# Patient Record
Sex: Male | Born: 1955 | Race: White | Hispanic: No | Marital: Married | Smoking: Former smoker
Health system: Southern US, Community
[De-identification: ages and names within clinical notes are randomized; demographics above are authoritative.]

## PROBLEM LIST (undated history)

## (undated) DIAGNOSIS — R350 Frequency of micturition: Secondary | ICD-10-CM

## (undated) DIAGNOSIS — R600 Localized edema: Secondary | ICD-10-CM

## (undated) DIAGNOSIS — I10 Essential (primary) hypertension: Secondary | ICD-10-CM

## (undated) DIAGNOSIS — E785 Hyperlipidemia, unspecified: Secondary | ICD-10-CM

## (undated) DIAGNOSIS — R531 Weakness: Secondary | ICD-10-CM

## (undated) DIAGNOSIS — F32A Depression, unspecified: Secondary | ICD-10-CM

## (undated) DIAGNOSIS — F329 Major depressive disorder, single episode, unspecified: Secondary | ICD-10-CM

## (undated) DIAGNOSIS — M1990 Unspecified osteoarthritis, unspecified site: Secondary | ICD-10-CM

## (undated) DIAGNOSIS — M2540 Effusion, unspecified joint: Secondary | ICD-10-CM

## (undated) DIAGNOSIS — F419 Anxiety disorder, unspecified: Secondary | ICD-10-CM

## (undated) DIAGNOSIS — M2550 Pain in unspecified joint: Secondary | ICD-10-CM

## (undated) DIAGNOSIS — G8929 Other chronic pain: Secondary | ICD-10-CM

## (undated) DIAGNOSIS — R609 Edema, unspecified: Secondary | ICD-10-CM

## (undated) DIAGNOSIS — M549 Dorsalgia, unspecified: Secondary | ICD-10-CM

## (undated) HISTORY — PX: COLONOSCOPY: SHX174

## (undated) HISTORY — DX: Essential (primary) hypertension: I10

---

## 1987-12-07 HISTORY — PX: TIBIA HARDWARE REMOVAL: SUR1133

## 1987-12-07 HISTORY — PX: TIBIA FRACTURE SURGERY: SHX806

## 2012-05-03 ENCOUNTER — Emergency Department: Payer: Self-pay | Admitting: Emergency Medicine

## 2012-05-03 LAB — CBC
HCT: 43.5 % (ref 40.0–52.0)
MCV: 94 fL (ref 80–100)
Platelet: 251 10*3/uL (ref 150–440)
RBC: 4.61 10*6/uL (ref 4.40–5.90)
RDW: 13.6 % (ref 11.5–14.5)
WBC: 6.8 10*3/uL (ref 3.8–10.6)

## 2012-05-03 LAB — BASIC METABOLIC PANEL
Anion Gap: 10 (ref 7–16)
BUN: 11 mg/dL (ref 7–18)
Calcium, Total: 9.1 mg/dL (ref 8.5–10.1)
Co2: 25 mmol/L (ref 21–32)
Creatinine: 0.79 mg/dL (ref 0.60–1.30)
EGFR (African American): >60
Glucose: 81 mg/dL (ref 65–99)
Osmolality: 285 (ref 275–301)
Sodium: 144 mmol/L (ref 136–145)

## 2012-05-03 LAB — URINALYSIS, COMPLETE
Bacteria: NONE SEEN
Bilirubin,UR: NEGATIVE
Blood: NEGATIVE
Glucose,UR: NEGATIVE mg/dL (ref 0–75)
Ketone: NEGATIVE
Leukocyte Esterase: NEGATIVE
Nitrite: NEGATIVE
Protein: NEGATIVE
RBC,UR: NONE SEEN /HPF (ref 0–5)
Specific Gravity: 1.008 (ref 1.003–1.030)
Squamous Epithelial: <1
WBC UR: <1 /HPF (ref 0–5)

## 2012-05-03 LAB — CK TOTAL AND CKMB (NOT AT ARMC)
CK, Total: 76 U/L (ref 35–232)
CK-MB: 2.8 ng/mL (ref 0.5–3.6)

## 2012-05-03 LAB — TROPONIN I: Troponin-I: 0.03 ng/mL

## 2012-05-12 ENCOUNTER — Encounter: Payer: Self-pay | Admitting: Internal Medicine

## 2012-05-12 ENCOUNTER — Telehealth: Payer: Self-pay | Admitting: Internal Medicine

## 2012-05-12 ENCOUNTER — Ambulatory Visit (INDEPENDENT_AMBULATORY_CARE_PROVIDER_SITE_OTHER): Payer: PRIVATE HEALTH INSURANCE | Admitting: Internal Medicine

## 2012-05-12 VITALS — BP 210/124 | HR 69 | Temp 98.2°F | Resp 18 | Ht 71.0 in | Wt 270.5 lb

## 2012-05-12 DIAGNOSIS — R499 Unspecified voice and resonance disorder: Secondary | ICD-10-CM

## 2012-05-12 DIAGNOSIS — Z87891 Personal history of nicotine dependence: Secondary | ICD-10-CM | POA: Insufficient documentation

## 2012-05-12 DIAGNOSIS — M79606 Pain in leg, unspecified: Secondary | ICD-10-CM

## 2012-05-12 DIAGNOSIS — I10 Essential (primary) hypertension: Secondary | ICD-10-CM

## 2012-05-12 DIAGNOSIS — F17200 Nicotine dependence, unspecified, uncomplicated: Secondary | ICD-10-CM

## 2012-05-12 DIAGNOSIS — M79609 Pain in unspecified limb: Secondary | ICD-10-CM

## 2012-05-12 DIAGNOSIS — G8929 Other chronic pain: Secondary | ICD-10-CM

## 2012-05-12 DIAGNOSIS — K0889 Other specified disorders of teeth and supporting structures: Secondary | ICD-10-CM

## 2012-05-12 DIAGNOSIS — Z720 Tobacco use: Secondary | ICD-10-CM

## 2012-05-12 DIAGNOSIS — R498 Other voice and resonance disorders: Secondary | ICD-10-CM

## 2012-05-12 DIAGNOSIS — K089 Disorder of teeth and supporting structures, unspecified: Secondary | ICD-10-CM

## 2012-05-12 HISTORY — DX: Unspecified voice and resonance disorder: R49.9

## 2012-05-12 MED ORDER — OMEPRAZOLE 40 MG PO CPDR: 40.0000 mg | DELAYED_RELEASE_CAPSULE | Freq: Every day | ORAL | Status: DC

## 2012-05-12 MED ORDER — ESOMEPRAZOLE MAGNESIUM 40 MG PO CPDR: 40.0000 mg | DELAYED_RELEASE_CAPSULE | Freq: Every day | ORAL | Status: DC

## 2012-05-12 MED ORDER — TELMISARTAN-HCTZ 80-25 MG PO TABS: 1.0000 | ORAL_TABLET | Freq: Every day | ORAL | Status: DC

## 2012-05-12 MED ORDER — SERTRALINE HCL 50 MG PO TABS: 50.0000 mg | ORAL_TABLET | Freq: Every day | ORAL | Status: DC

## 2012-05-12 MED ORDER — TRAMADOL HCL 50 MG PO TABS: 50.0000 mg | ORAL_TABLET | Freq: Three times a day (TID) | ORAL | Status: DC | PRN

## 2012-05-12 MED ORDER — HYDROCODONE-ACETAMINOPHEN 5-500 MG PO TABS: 1.0000 | ORAL_TABLET | Freq: Four times a day (QID) | ORAL | Status: DC | PRN

## 2012-05-12 MED ORDER — DIAZEPAM 5 MG PO TABS: 5.0000 mg | ORAL_TABLET | Freq: Two times a day (BID) | ORAL | Status: DC | PRN

## 2012-05-12 NOTE — Telephone Encounter (Signed)
Rx called in 

## 2012-05-12 NOTE — Patient Instructions (Addendum)
Continue hydralazine 3 times  daily for 2 days,  Then twice daily for two days,  Then once daily for 2 days,  Then stop.[name]  Stop earlier if bp drops too quickly (meaning becomes less than 140 in 48 hr or less.).  IF bp is not below 180 after 2 days of micardis,  Continue the hydralazine twice daily  until bp is under 180, then taper as above    Start the micardis hct tmorrow..You had a dose today  In the office, along with clonidine 0.1 mg .    Return to [facility] lab on Thursday for fasting labs (no food or caloried beverages after midnight) and return to office on Monday for a followup visit to dicuss bp and cholesterol.   Referring to Erline Hau to evaluate your throat bc of the hoarseness.  I am starting you on a medicine for reflux.   Avoifd ibuprofen and alleve until we get your bp controlled.  Use tramadol in between the vicodin if needed for pain

## 2012-05-12 NOTE — Telephone Encounter (Signed)
Omeprazole 40 mg daily.  Please call in  #30  2 refills

## 2012-05-12 NOTE — Telephone Encounter (Signed)
Caller: David[facility]; PCP: Duncan Dull; CB#: [phone]; ; ; Call regarding Medication Issue; seen in office 05/12/12 as new patient.  Dr. Darrick Huntsman ordered nexium, but insurance will not cover it.  States "must use pantoprozole, lansoprozole, or omeprazole on first claim."  INFO TO OFFICE FOR PROVIDER REVIEW/RX/CALLBACK. MAY REACH PHARMACIST AT [phone].

## 2012-05-12 NOTE — Telephone Encounter (Signed)
Patient called back and asked if we could call in a RX for a in home automatic blood pressure machine.

## 2012-05-12 NOTE — Assessment & Plan Note (Signed)
Present for 10 years,  Getting worse.  Tobacco history of 30 yrs,  No prior ENT evaluation.refer to [facility]
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
## 2012-05-14 ENCOUNTER — Encounter: Payer: Self-pay | Admitting: Internal Medicine

## 2012-05-14 NOTE — Assessment & Plan Note (Signed)
Accelerated.  Started on hydralazine by ER.  Adding micardis/hct with eventual wean of hydralazine.

## 2012-05-14 NOTE — Assessment & Plan Note (Signed)
counseling given,  Risks of continued risk with specific effects on BP, wound healing,  Etc, given ,

## 2012-05-17 ENCOUNTER — Telehealth: Payer: Self-pay | Admitting: Internal Medicine

## 2012-05-17 DIAGNOSIS — E785 Hyperlipidemia, unspecified: Secondary | ICD-10-CM

## 2012-05-17 DIAGNOSIS — I10 Essential (primary) hypertension: Secondary | ICD-10-CM

## 2012-05-18 ENCOUNTER — Telehealth: Payer: Self-pay | Admitting: Internal Medicine

## 2012-05-18 NOTE — Telephone Encounter (Signed)
There is already a note on this.

## 2012-05-18 NOTE — Telephone Encounter (Signed)
Pt came in today needing order to get lab work done at [facility]  It would like to go tomorrow 6/14 Please advise

## 2012-05-18 NOTE — Telephone Encounter (Signed)
Patient needing paper work sent to the hospital for labs. Patient wanting to go 6.14.13 Friday.

## 2012-05-19 NOTE — Telephone Encounter (Signed)
Did he lose the sheet i gave hin on June 7?  He needs fasting lipids and CMET  You have to write hyperlipidemia and hypertension as the codes.

## 2012-05-19 NOTE — Telephone Encounter (Signed)
Patient says that he was never given a sheet . Labs ordered and printed and patient will pick this up on Monday.

## 2012-05-22 ENCOUNTER — Encounter: Payer: PRIVATE HEALTH INSURANCE | Admitting: Internal Medicine

## 2012-06-19 ENCOUNTER — Encounter: Payer: PRIVATE HEALTH INSURANCE | Admitting: Internal Medicine

## 2012-06-19 DIAGNOSIS — Z0289 Encounter for other administrative examinations: Secondary | ICD-10-CM

## 2012-07-21 ENCOUNTER — Encounter: Payer: Self-pay | Admitting: Internal Medicine

## 2012-07-21 ENCOUNTER — Ambulatory Visit (INDEPENDENT_AMBULATORY_CARE_PROVIDER_SITE_OTHER): Payer: Medicare Other | Admitting: Internal Medicine

## 2012-07-21 VITALS — BP 138/86 | HR 80 | Temp 98.0°F | Resp 16 | Wt 253.2 lb

## 2012-07-21 DIAGNOSIS — R499 Unspecified voice and resonance disorder: Secondary | ICD-10-CM

## 2012-07-21 DIAGNOSIS — G8929 Other chronic pain: Secondary | ICD-10-CM

## 2012-07-21 DIAGNOSIS — M79609 Pain in unspecified limb: Secondary | ICD-10-CM

## 2012-07-21 DIAGNOSIS — R498 Other voice and resonance disorders: Secondary | ICD-10-CM

## 2012-07-21 DIAGNOSIS — Z125 Encounter for screening for malignant neoplasm of prostate: Secondary | ICD-10-CM

## 2012-07-21 DIAGNOSIS — I10 Essential (primary) hypertension: Secondary | ICD-10-CM

## 2012-07-21 MED ORDER — DIAZEPAM 5 MG PO TABS
5.0000 mg | ORAL_TABLET | Freq: Two times a day (BID) | ORAL | Status: DC | PRN
Start: 2012-07-21 — End: 2012-11-10

## 2012-07-21 MED ORDER — HYDROCODONE-ACETAMINOPHEN 5-500 MG PO TABS: 1.0000 | ORAL_TABLET | Freq: Four times a day (QID) | ORAL | Status: DC | PRN
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
## 2012-07-23 ENCOUNTER — Encounter: Payer: Self-pay | Admitting: Internal Medicine
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
## 2012-07-23 NOTE — Assessment & Plan Note (Signed)
He postponed his ENT evaluation do to a conflict. I emphasize to him my concern for a occult malignancy given his history of smoking. H reschedule his appointment with ENT.

## 2012-07-23 NOTE — Assessment & Plan Note (Signed)
Significant improvement on current regimen. No changes today.

## 2012-09-06 ENCOUNTER — Other Ambulatory Visit: Payer: Self-pay | Admitting: Internal Medicine

## 2012-10-23 ENCOUNTER — Other Ambulatory Visit (INDEPENDENT_AMBULATORY_CARE_PROVIDER_SITE_OTHER): Payer: Medicare Other

## 2012-10-23 DIAGNOSIS — E785 Hyperlipidemia, unspecified: Secondary | ICD-10-CM

## 2012-10-23 DIAGNOSIS — I10 Essential (primary) hypertension: Secondary | ICD-10-CM

## 2012-10-23 DIAGNOSIS — Z125 Encounter for screening for malignant neoplasm of prostate: Secondary | ICD-10-CM

## 2012-10-23 LAB — PSA, MEDICARE: PSA: 0.47 ng/ml (ref 0.10–4.00)

## 2012-10-23 LAB — LIPID PANEL
Cholesterol: 203 mg/dL — ABNORMAL HIGH (ref 0–200)
HDL: 39.5 mg/dL (ref >39.00)
Triglycerides: 116 mg/dL (ref 0.0–149.0)

## 2012-10-23 LAB — COMPREHENSIVE METABOLIC PANEL
Albumin: 4.1 g/dL (ref 3.5–5.2)
Alkaline Phosphatase: 78 U/L (ref 39–117)
BUN: 14 mg/dL (ref 6–23)
Creatinine, Ser: 0.8 mg/dL (ref 0.4–1.5)
Glucose, Bld: 93 mg/dL (ref 70–99)
Potassium: 4.4 mEq/L (ref 3.5–5.1)
Total Bilirubin: 0.7 mg/dL (ref 0.3–1.2)

## 2012-10-23 LAB — LDL CHOLESTEROL, DIRECT: Direct LDL: 142.8 mg/dL

## 2012-10-23 NOTE — Addendum Note (Signed)
Addended by: Montine Circle D on: 10/23/2012 09:19 AM   Modules accepted: Orders

## 2012-10-27 ENCOUNTER — Other Ambulatory Visit: Payer: Self-pay | Admitting: *Deleted

## 2012-10-27 ENCOUNTER — Other Ambulatory Visit: Payer: Self-pay | Admitting: Internal Medicine

## 2012-10-27 ENCOUNTER — Encounter: Payer: Medicare Other | Admitting: Internal Medicine

## 2012-10-27 NOTE — Telephone Encounter (Signed)
Forward to Dr. Tullo 

## 2012-11-10 ENCOUNTER — Ambulatory Visit (INDEPENDENT_AMBULATORY_CARE_PROVIDER_SITE_OTHER): Payer: PRIVATE HEALTH INSURANCE | Admitting: Internal Medicine

## 2012-11-10 ENCOUNTER — Encounter: Payer: Self-pay | Admitting: Internal Medicine

## 2012-11-10 VITALS — BP 150/92 | HR 64 | Temp 98.0°F | Ht 70.0 in | Wt 250.0 lb

## 2012-11-10 DIAGNOSIS — Z0000 Encounter for general adult medical examination without abnormal findings: Secondary | ICD-10-CM

## 2012-11-10 DIAGNOSIS — Z1331 Encounter for screening for depression: Secondary | ICD-10-CM

## 2012-11-10 DIAGNOSIS — G8929 Other chronic pain: Secondary | ICD-10-CM

## 2012-11-10 DIAGNOSIS — Z720 Tobacco use: Secondary | ICD-10-CM

## 2012-11-10 DIAGNOSIS — I872 Venous insufficiency (chronic) (peripheral): Secondary | ICD-10-CM

## 2012-11-10 DIAGNOSIS — E785 Hyperlipidemia, unspecified: Secondary | ICD-10-CM

## 2012-11-10 DIAGNOSIS — N138 Other obstructive and reflux uropathy: Secondary | ICD-10-CM

## 2012-11-10 DIAGNOSIS — R498 Other voice and resonance disorders: Secondary | ICD-10-CM

## 2012-11-10 DIAGNOSIS — F17200 Nicotine dependence, unspecified, uncomplicated: Secondary | ICD-10-CM

## 2012-11-10 DIAGNOSIS — I8390 Asymptomatic varicose veins of unspecified lower extremity: Secondary | ICD-10-CM

## 2012-11-10 DIAGNOSIS — N403 Nodular prostate with lower urinary tract symptoms: Secondary | ICD-10-CM

## 2012-11-10 DIAGNOSIS — I10 Essential (primary) hypertension: Secondary | ICD-10-CM

## 2012-11-10 DIAGNOSIS — M79609 Pain in unspecified limb: Secondary | ICD-10-CM

## 2012-11-10 DIAGNOSIS — Z23 Encounter for immunization: Secondary | ICD-10-CM

## 2012-11-10 DIAGNOSIS — R499 Unspecified voice and resonance disorder: Secondary | ICD-10-CM

## 2012-11-10 MED ORDER — TRAMADOL HCL 50 MG PO TABS: 50.0000 mg | ORAL_TABLET | Freq: Three times a day (TID) | ORAL | Status: DC | PRN

## 2012-11-10 MED ORDER — LOSARTAN POTASSIUM-HCTZ 100-25 MG PO TABS: 1.0000 | ORAL_TABLET | Freq: Every day | ORAL | Status: DC

## 2012-11-10 MED ORDER — TETANUS-DIPHTH-ACELL PERTUSSIS 5-2.5-18.5 LF-MCG/0.5 IM SUSP: 0.5000 mL | Freq: Once | INTRAMUSCULAR | Status: DC

## 2012-11-10 MED ORDER — HYDROCODONE-ACETAMINOPHEN 5-325 MG PO TABS: 1.0000 | ORAL_TABLET | Freq: Four times a day (QID) | ORAL | Status: DC | PRN

## 2012-11-10 MED ORDER — DIAZEPAM 5 MG PO TABS: 5.0000 mg | ORAL_TABLET | Freq: Two times a day (BID) | ORAL | Status: DC | PRN

## 2012-11-10 MED ORDER — TAMSULOSIN HCL 0.4 MG PO CAPS: 0.4000 mg | ORAL_CAPSULE | Freq: Every day | ORAL | Status: DC

## 2012-11-10 MED ORDER — SERTRALINE HCL 100 MG PO TABS: 100.0000 mg | ORAL_TABLET | Freq: Every day | ORAL | Status: DC
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
## 2012-11-10 NOTE — Patient Instructions (Addendum)
I am changing your blood pressure medication to something more affordable, losartan/hct.  A goal for bp is 130/80.     Consider taking red yeast rice for choelsterol.  600 mg twice daily .,  minimum of 6 weeks before repeat testing   Referral to Erline Hau after the New Year for evaluation of hoarseness

## 2012-11-12 ENCOUNTER — Encounter: Payer: Self-pay | Admitting: Internal Medicine

## 2012-11-12 DIAGNOSIS — I872 Venous insufficiency (chronic) (peripheral): Secondary | ICD-10-CM | POA: Insufficient documentation

## 2012-11-12 DIAGNOSIS — N403 Nodular prostate with lower urinary tract symptoms: Secondary | ICD-10-CM | POA: Insufficient documentation

## 2012-11-12 DIAGNOSIS — E785 Hyperlipidemia, unspecified: Secondary | ICD-10-CM | POA: Insufficient documentation

## 2012-11-12 DIAGNOSIS — Z0000 Encounter for general adult medical examination without abnormal findings: Secondary | ICD-10-CM | POA: Insufficient documentation

## 2012-11-12 NOTE — Assessment & Plan Note (Addendum)
flomax prescribed.  Referral to [facility] for nodular prostate .

## 2012-11-12 NOTE — Assessment & Plan Note (Signed)
LDL was 142 recently.  Given ongoing tobacco abuse and HTN, recommended trial of red yeast rice. Repeat i 7 to 8 weeks
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
## 2012-11-12 NOTE — Assessment & Plan Note (Signed)
Spent 3 minutes discussing risk of continued tobacco abuse, including but not limited to CAD, PAD, hypertension, and CA.  He is not interested in pharmacotherapy at this time. 

## 2012-11-12 NOTE — Assessment & Plan Note (Signed)
Referral accepted for after Jan 1,  [facility] requested
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
## 2012-11-12 NOTE — Assessment & Plan Note (Signed)
Prostate and testicular exam were done.

## 2012-11-12 NOTE — Assessment & Plan Note (Signed)
Changing micardis to generic losartan for cost savings

## 2013-01-04 ENCOUNTER — Other Ambulatory Visit: Payer: Self-pay | Admitting: *Deleted

## 2013-01-04 NOTE — Telephone Encounter (Signed)
Med denied. Was given Hydrocodone 5-500 #30 with refills in Jan per Pharmacy.

## 2013-05-14 ENCOUNTER — Other Ambulatory Visit: Payer: Self-pay | Admitting: *Deleted

## 2013-05-17 MED ORDER — HYDROCODONE-ACETAMINOPHEN 5-325 MG PO TABS: 1.0000 | ORAL_TABLET | Freq: Four times a day (QID) | ORAL | Status: DC | PRN

## 2013-05-17 MED ORDER — DIAZEPAM 5 MG PO TABS: 5.0000 mg | ORAL_TABLET | Freq: Two times a day (BID) | ORAL | Status: DC | PRN

## 2013-05-17 NOTE — Telephone Encounter (Signed)
Called left detailed message for patient to call schedule OV and for follow up on ENT patient returned call while witting appointment made for 06/04/13.

## 2013-05-17 NOTE — Telephone Encounter (Signed)
Patent's congtrolled substances are being refill for 30 days.  He needs a 6 month follow up appt prior to more refills.  Please ask him if he kept the appt with ENT about his hoarseness that we arranged for him back in December, because this is the 2nd time we have made it and I have no report.

## 2013-06-04 ENCOUNTER — Encounter: Payer: Self-pay | Admitting: *Deleted

## 2013-06-04 ENCOUNTER — Ambulatory Visit (INDEPENDENT_AMBULATORY_CARE_PROVIDER_SITE_OTHER): Payer: Medicare Other | Admitting: Internal Medicine

## 2013-06-04 ENCOUNTER — Encounter: Payer: Self-pay | Admitting: Internal Medicine

## 2013-06-04 VITALS — BP 128/78 | HR 67 | Temp 98.3°F | Resp 16 | Wt 253.2 lb

## 2013-06-04 DIAGNOSIS — N138 Other obstructive and reflux uropathy: Secondary | ICD-10-CM

## 2013-06-04 DIAGNOSIS — Z79899 Other long term (current) drug therapy: Secondary | ICD-10-CM

## 2013-06-04 DIAGNOSIS — Z800 Family history of malignant neoplasm of digestive organs: Secondary | ICD-10-CM

## 2013-06-04 DIAGNOSIS — I10 Essential (primary) hypertension: Secondary | ICD-10-CM

## 2013-06-04 DIAGNOSIS — N403 Nodular prostate with lower urinary tract symptoms: Secondary | ICD-10-CM

## 2013-06-04 DIAGNOSIS — R499 Unspecified voice and resonance disorder: Secondary | ICD-10-CM

## 2013-06-04 DIAGNOSIS — R498 Other voice and resonance disorders: Secondary | ICD-10-CM

## 2013-06-04 LAB — BASIC METABOLIC PANEL
BUN: 13 mg/dL (ref 6–23)
Creatinine, Ser: 0.9 mg/dL (ref 0.4–1.5)
GFR: 95.67 mL/min (ref >60.00)
Glucose, Bld: 78 mg/dL (ref 70–99)
Potassium: 4.6 mEq/L (ref 3.5–5.1)

## 2013-06-04 NOTE — Assessment & Plan Note (Addendum)
Never saw the urologist  In December.  Using the flomax.  No symptoms. . Recommended urology referral

## 2013-06-04 NOTE — Patient Instructions (Addendum)
Let me know when you would like to see a hand specialist for Your contracture    Referral to [facility]   Referral for colonoscopy to Dr Evette Cristal

## 2013-06-04 NOTE — Progress Notes (Signed)
Patient ID: Tyrone Mcdowell, male   DOB: 01/14/1956, 57 y.o.   MRN: [id number]   Patient Active Problem List   Diagnosis Date Noted  . Venous insufficiency of leg 11/12/2012  . Other and unspecified hyperlipidemia 11/12/2012  . Routine general medical examination at a health care facility 11/12/2012  . Nodular prostate with urinary retention 11/12/2012  . Hypertension 05/12/2012  . Hoarseness or changing voice 05/12/2012  . Tobacco abuse 05/12/2012  . Chronic leg pain 05/12/2012    Subjective:  CC:   Chief Complaint  Patient presents with  . Follow-up    medication refills    HPI:   Tyrone Mcdowell a 57 y.o. 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  He has reduced his tobacco use to one cigarette every 2 days.    Saw Erline Hau for evaluation of chronic hoarseness.  The exam was normal, no masses.    Flomax helping his urinary symrptoms.  No orthostasis.   Voiding once per njight Arthritis of both hands.   His middle finger is starting to develop a contracture    Past Medical History  Diagnosis Date  . Hypertension     Past Surgical History  Procedure Laterality Date  . Tibia fracture surgery  1989    pediedstrianj meets car   . Tibia hardware removal  1989       The following portions of the patient's history were reviewed and updated as appropriate: Allergies, current medications, and problem list.    Review of Systems:   12 Pt  review of systems was negative except those addressed in the HPI,     History   Social History  . Marital Status: Married    Spouse Name: N/A    Number of Children: N/A  . Years of Education: N/A   Occupational History  . Not on file.   Social History Main Topics  . Smoking status: Current Some Day Smoker -- 0.50 packs/day for 32 years    Types: Cigarettes  . Smokeless tobacco: Current User    Types: Chew  . Alcohol Use: Yes   . Drug Use: No  . Sexually Active: Not on file   Other Topics Concern  . Not on file   Social History Narrative  . No narrative on file    Objective:  BP 128/78  Pulse 67  Temp(Src) 98.3 F (36.8 C) (Oral)  Resp 16  Wt 253 lb 4 oz (114.873 kg)  BMI 36.34 kg/m2  SpO2 96%  General appearance: alert, cooperative and appears stated age Ears: normal TM's and external ear canals both ears Throat: lips, mucosa, and tongue normal; teeth and gums normal Neck: no adenopathy, no carotid bruit, supple, symmetrical, trachea midline and thyroid not enlarged, symmetric, no tenderness/mass/nodules Back: symmetric, no curvature. ROM normal. No CVA tenderness. Lungs: clear to auscultation bilaterally Heart: regular rate and rhythm, S1, S2 normal, no murmur, click, rub or gallop Abdomen: soft, non-tender; bowel sounds normal; no masses,  no organomegaly Pulses: 2+ and symmetric Skin: Skin color, texture, turgor normal. No rashes or lesions Lymph nodes: Cervical, supraclavicular, and axillary nodes normal.  Assessment and Plan:  Nodular prostate with urinary retention Never saw the urologist  In December.  Using the flomax.  No symptoms. . Recommended urology referral   Hoarseness or changing voice S/p ENT exam to rule out laryngeal ca  Hypertension Well controlled on current regimen. Renal function stable,  no changes today.   Updated Medication List Outpatient Encounter Prescriptions as of 06/04/2013  Medication Sig Dispense Refill  . aspirin 81 MG tablet Take 81 mg by mouth daily.      . diazepam (VALIUM) 5 MG tablet Take 1 tablet (5 mg total) by mouth every 12 (twelve) hours as needed for anxiety.  60 tablet  0  . HYDROcodone-acetaminophen (NORCO/VICODIN) 5-325 MG per tablet Take 1 tablet by mouth every 6 (six) hours as needed for pain.  60 tablet  3  . losartan-hydrochlorothiazide (HYZAAR) 100-25 MG per tablet Take 1 tablet by mouth daily.  90 tablet  3  . sertraline (ZOLOFT) 100  MG tablet Take 1 tablet (100 mg total) by mouth daily.  90 tablet  3  . Tamsulosin HCl (FLOMAX) 0.4 MG CAPS Take 1 capsule (0.4 mg total) by mouth daily after breakfast.  30 capsule  5  . TDaP (BOOSTRIX) 5-2.5-18.5 LF-MCG/0.5 injection Inject 0.5 mLs into the muscle once.  0.5 mL  0  . traMADol (ULTRAM) 50 MG tablet Take 1 tablet (50 mg total) by mouth every 8 (eight) hours as needed.  360 tablet  3   No facility-administered encounter medications on file as of 06/04/2013.     Orders Placed This Encounter  Procedures  . Basic metabolic panel  . Ambulatory referral to Urology  . Ambulatory referral to Gastroenterology    Return in about 6 months (around 12/04/2013).

## 2013-06-05 ENCOUNTER — Encounter: Payer: Self-pay | Admitting: *Deleted

## 2013-06-05 NOTE — Assessment & Plan Note (Signed)
Well controlled on current regimen. Renal function stable, no changes today. 

## 2013-06-05 NOTE — Assessment & Plan Note (Signed)
S/p ENT exam to rule out laryngeal ca

## 2013-06-06 ENCOUNTER — Other Ambulatory Visit: Payer: Self-pay | Admitting: *Deleted

## 2013-06-06 MED ORDER — TAMSULOSIN HCL 0.4 MG PO CAPS: 0.4000 mg | ORAL_CAPSULE | Freq: Every day | ORAL | Status: DC

## 2013-06-11 ENCOUNTER — Ambulatory Visit: Payer: Self-pay | Admitting: General Surgery

## 2013-07-03 ENCOUNTER — Ambulatory Visit: Payer: Self-pay | Admitting: General Surgery

## 2013-08-07 ENCOUNTER — Encounter: Payer: Self-pay | Admitting: *Deleted

## 2013-09-06 ENCOUNTER — Other Ambulatory Visit: Payer: Self-pay | Admitting: *Deleted

## 2013-09-06 NOTE — Telephone Encounter (Signed)
Last visit 06/04/13, refill?

## 2013-09-07 ENCOUNTER — Telehealth: Payer: Self-pay | Admitting: Internal Medicine

## 2013-09-07 MED ORDER — TRAMADOL HCL 50 MG PO TABS: 50.0000 mg | ORAL_TABLET | Freq: Three times a day (TID) | ORAL | Status: DC | PRN

## 2013-09-07 MED ORDER — LOSARTAN POTASSIUM-HCTZ 100-25 MG PO TABS: 1.0000 | ORAL_TABLET | Freq: Every day | ORAL | Status: DC

## 2013-09-07 NOTE — Telephone Encounter (Signed)
Losartan Rx has been refilled, Tramadol pending ok from Dr. Darrick Huntsman, see refill encounter.

## 2013-09-07 NOTE — Telephone Encounter (Signed)
90 day supply but  need to be seen in December prior to more refills.

## 2013-09-07 NOTE — Telephone Encounter (Signed)
States his pharmacy is to be sending refill request for BP medication losartan. Pt is completely out and does not have one to take for today.  Also states he needs Tramadol refill.

## 2013-09-11 MED ORDER — TRAMADOL HCL 50 MG PO TABS: 50.0000 mg | ORAL_TABLET | Freq: Four times a day (QID) | ORAL | Status: DC | PRN

## 2013-09-11 NOTE — Telephone Encounter (Signed)
rx has been corrected to allow for q 6 hr dosing .,  On printer

## 2013-09-11 NOTE — Telephone Encounter (Signed)
In comment you allowed for 4 tablets per day for a total of 360. Sig reads 1 every eight hours pharmacy called and ask if sig should read 1 tablet every six or should the total count be reduced to 240, please advise?

## 2013-09-11 NOTE — Telephone Encounter (Signed)
Script faxed as requested.

## 2013-10-12 ENCOUNTER — Telehealth: Payer: Self-pay | Admitting: *Deleted

## 2013-10-12 NOTE — Telephone Encounter (Signed)
Patient called requesting refill on diazepam.

## 2013-10-15 MED ORDER — HYDROCODONE-ACETAMINOPHEN 5-325 MG PO TABS: 1.0000 | ORAL_TABLET | Freq: Four times a day (QID) | ORAL | Status: DC | PRN

## 2013-10-15 MED ORDER — DIAZEPAM 5 MG PO TABS: 5.0000 mg | ORAL_TABLET | Freq: Two times a day (BID) | ORAL | Status: DC | PRN

## 2013-10-15 NOTE — Telephone Encounter (Signed)
Please let patient know that [facility] has instituted a new policy for refills on controlled substances due to increased reports of overdoses on prescribed medications.  Patients must be reevaluated every 6 months, and must sign a contract stating that they will not receive narcotics from multiple prescribers, expect refills by the on call physician, etc. He will need to go through that when he picks up his prescription. He will need an appt in December for future refills

## 2013-10-15 NOTE — Telephone Encounter (Signed)
Pt notified, verbalized understanding.

## 2013-10-15 NOTE — Addendum Note (Signed)
Addended by: Sherlene Shams on: 10/15/2013 12:19 PM   Modules accepted: Orders

## 2013-10-15 NOTE — Telephone Encounter (Signed)
Patient needs refill on diazepam and hydrocodone please advise?

## 2013-10-16 ENCOUNTER — Encounter: Payer: Self-pay | Admitting: *Deleted

## 2013-11-19 ENCOUNTER — Other Ambulatory Visit: Payer: Self-pay | Admitting: Internal Medicine

## 2013-11-19 ENCOUNTER — Other Ambulatory Visit: Payer: Self-pay

## 2013-12-04 ENCOUNTER — Ambulatory Visit: Payer: 59 | Admitting: Internal Medicine

## 2013-12-10 ENCOUNTER — Ambulatory Visit: Payer: 59 | Admitting: Internal Medicine

## 2013-12-17 ENCOUNTER — Encounter (INDEPENDENT_AMBULATORY_CARE_PROVIDER_SITE_OTHER): Payer: Self-pay

## 2013-12-17 ENCOUNTER — Encounter: Payer: Self-pay | Admitting: Internal Medicine

## 2013-12-17 ENCOUNTER — Ambulatory Visit (INDEPENDENT_AMBULATORY_CARE_PROVIDER_SITE_OTHER): Payer: 59 | Admitting: Internal Medicine

## 2013-12-17 VITALS — BP 146/92 | HR 86 | Temp 98.6°F | Resp 16 | Wt 264.5 lb

## 2013-12-17 DIAGNOSIS — M79609 Pain in unspecified limb: Secondary | ICD-10-CM

## 2013-12-17 DIAGNOSIS — F17200 Nicotine dependence, unspecified, uncomplicated: Secondary | ICD-10-CM

## 2013-12-17 DIAGNOSIS — R498 Other voice and resonance disorders: Secondary | ICD-10-CM

## 2013-12-17 DIAGNOSIS — I10 Essential (primary) hypertension: Secondary | ICD-10-CM

## 2013-12-17 DIAGNOSIS — E669 Obesity, unspecified: Secondary | ICD-10-CM

## 2013-12-17 DIAGNOSIS — Z716 Tobacco abuse counseling: Secondary | ICD-10-CM

## 2013-12-17 DIAGNOSIS — Z720 Tobacco use: Secondary | ICD-10-CM

## 2013-12-17 DIAGNOSIS — R499 Unspecified voice and resonance disorder: Secondary | ICD-10-CM

## 2013-12-17 DIAGNOSIS — G8929 Other chronic pain: Secondary | ICD-10-CM

## 2013-12-17 DIAGNOSIS — Z23 Encounter for immunization: Secondary | ICD-10-CM

## 2013-12-17 DIAGNOSIS — E785 Hyperlipidemia, unspecified: Secondary | ICD-10-CM

## 2013-12-17 DIAGNOSIS — M79606 Pain in leg, unspecified: Secondary | ICD-10-CM

## 2013-12-17 DIAGNOSIS — Z7189 Other specified counseling: Secondary | ICD-10-CM

## 2013-12-17 MED ORDER — TAMSULOSIN HCL 0.4 MG PO CAPS: 0.4000 mg | ORAL_CAPSULE | Freq: Every day | ORAL | Status: DC

## 2013-12-17 MED ORDER — RED YEAST RICE 600 MG PO CAPS: 1.0000 | ORAL_CAPSULE | Freq: Two times a day (BID) | ORAL | Status: DC

## 2013-12-17 MED ORDER — TRAMADOL HCL 50 MG PO TABS: ORAL_TABLET | ORAL | Status: DC

## 2013-12-17 NOTE — Patient Instructions (Addendum)
Your blood pressure is elevated today.  Please continue the losartan and add  2.5 mg amlodipine daily for persistent BP > 140/90   Please start red Yeast rice 600 mg twice daily for your high cholesterol.  Repeat your fasting cholesterol in about 8 weeks   I want you to lose weight.  Your goal is to get your BMI < 30 ,  This means getting down to 208 lbs .  This is  Dr Lupita Dawn version of a  "Low GI"  Diet:   Low GI diets will lower your blood sugars and allow you to lose 4 to 12  lbs  per month depending on how closely you follow it. Your goal with exercise is a  30 minutes of aerobic exercise 5 days per week (Walking does not count once it becomes easy!)    All of these foods can be found at grocery stores and in bulk at [employer].  The Atkins protein bars and shakes are available in more varieties at [employer], [employer] and [place].     7 AM Breakfast:  Choose from the following:  Low carbohydrate Protein  Shakes (I recommend the EAS AdvantEdge "Carb Control" shakes  Or the low carb shakes by Atkins.    2.5 carbs   Arnold's "Sandwhich Thin"toasted  w/ peanut butter (no jelly: about 20 net carbs  "Bagel Thin" with cream cheese and salmon: about 20 carbs   a scrambled egg/bacon/cheese burrito made with [place]'s "carb balance" whole wheat tortilla  (about 10 net carbs )   Avoid cereal and bananas, oatmeal and cream of wheat and grits. They are loaded with carbohydrates!   10 AM: high protein snack  Protein bar by Atkins (the snack size, under 200 cal, usually < 6 net carbs).    A stick of cheese:  Around 1 carb,  100 cal     Dannon Light n Fit [place] Yogurt  (80 cal, 8 carbs)  Other so called "protein bars" and Greek yogurts tend to be loaded with carbohydrates.  Remember, in food advertising, the word "energy" is synonymous for " carbohydrate."  Lunch:   A Sandwich using the bread choices listed, Can use any  Eggs,  lunchmeat, grilled meat or canned tuna), avocado, regular mayo/mustard   and cheese.  A Salad using blue cheese, ranch,  Goddess or vinagrette,  No croutons or "confetti" and no "candied nuts" but regular nuts OK.   No pretzels or chips.  Pickles and miniature sweet peppers are a good low carb alternative that provide a "crunch"  The bread is the only source of carbohydrate in a sandwich and  can be decreased by trying some of these alternatives to traditional loaf bread  Joseph's makes a pita bread and a flat bread that are 50 cal and 4 net carbs available at [place] and [place].  This can be toasted to use with hummous as well  Toufayan makes a low carb flatbread that's 100 cal and 9 net carbs available at [employer] and [employer]'s makes 2 sizes of  Low carb whole wheat tortilla  (The large one is 210 cal and 6 net carbs) Avoid "Low fat dressings, as well as Barry Brunner and [place] dressings They are loaded with sugar!   3 PM/ Mid day  Snack:  Consider  1 ounce of  almonds, walnuts, pistachios, pecans, peanuts,  Macadamia nuts or a nut medley.  Avoid "granola"; the dried cranberries and raisins are loaded with carbohydrates.  Mixed nuts as long as there are no raisins,  cranberries or dried fruit.     6 PM  Dinner:     Meat/fowl/fish with a green salad, and either broccoli, cauliflower, green beans, spinach, brussel sprouts or  Lima beans. DO NOT BREAD THE PROTEIN!!      There is a low carb pasta by Dreamfield's that is acceptable and tastes great: only 5 digestible carbs/serving.( All grocery stores but BJs carry it )  Try Hurley Cisco Angelo's chicken piccata or chicken or eggplant parm over low carb pasta.(Lowes and BJs)   Marjory Lies Sanchez's "Carnitas" (pulled pork, no sauce,  0 carbs) or his beef pot roast to make a dinner burrito (at [employer]J's)  Pesto over low carb pasta (bj's sells a good quality pesto in the center refrigerated section of the deli   Whole wheat pasta is still full of digestible carbs and  Not as low in glycemic index as Dreamfield's.   Brown rice is  still rice,  So skip the rice and noodles if you eat [place] or [place] (or at least limit to 1/2 cup)  9 PM snack :   Breyer's "low carb" fudgsicle or  ice cream bar (Carb Smart line), or  Weight Watcher's ice cream bar , or another "no sugar added" ice cream;  a serving of fresh berries/cherries with whipped cream   Cheese or DANNON'S LlGHT N FIT [place] YOGURT  Avoid bananas, pineapple, grapes  and watermelon on a regular basis because they are high in sugar.  THINK OF THEM AS DESSERT  Remember that snack Substitutions should be less than 10 NET carbs per serving and meals < 20 carbs. Remember to subtract fiber grams to get the "net carbs."  Managing Your High Blood Pressure Blood pressure is a measurement of how forceful your blood is pressing against the walls of the arteries. Arteries are muscular tubes within the circulatory system. Blood pressure does not stay the same. Blood pressure rises when you are active, excited, or nervous; and it lowers during sleep and relaxation. If the numbers measuring your blood pressure stay above normal most of the time, you are at risk for health problems. High blood pressure (hypertension) is a long-term (chronic) condition in which blood pressure is elevated. A blood pressure reading is recorded as two numbers, such as 120 over 80 (or 120/80). The first, higher number is called the systolic pressure. It is a measure of the pressure in your arteries as the heart beats. The second, lower number is called the diastolic pressure. It is a measure of the pressure in your arteries as the heart relaxes between beats.  Keeping your blood pressure in a normal range is important to your overall health and prevention of health problems, such as heart disease and stroke. When your blood pressure is uncontrolled, your heart has to work harder than normal. High blood pressure is a very common condition in adults because blood pressure tends to rise with age. Men and women are  equally likely to have hypertension but at different times in life. Before age 47, men are more likely to have hypertension. After 56 years of age, women are more likely to have it. Hypertension is especially common in African Americans. This condition often has no signs or symptoms. The cause of the condition is usually not known. Your caregiver can help you come up with a plan to keep your blood pressure in a normal, healthy range. BLOOD PRESSURE STAGES Blood pressure is classified into  four stages: normal, prehypertension, stage 1, and stage 2. Your blood pressure reading will be used to determine what type of treatment, if any, is necessary. Appropriate treatment options are tied to these four stages:  Normal  Systolic pressure (mm Hg): below 120.  Diastolic pressure (mm Hg): below 80. Prehypertension  Systolic pressure (mm Hg): 120 to 139.  Diastolic pressure (mm Hg): 80 to 89. Stage1  Systolic pressure (mm Hg): 140 to 159.  Diastolic pressure (mm Hg): 90 to 99. Stage2  Systolic pressure (mm Hg): 160 or above.  Diastolic pressure (mm Hg): 100 or above. RISKS RELATED TO HIGH BLOOD PRESSURE Managing your blood pressure is an important responsibility. Uncontrolled high blood pressure can lead to:  A heart attack.  A stroke.  A weakened blood vessel (aneurysm).  Heart failure.  Kidney damage.  Eye damage.  Metabolic syndrome.  Memory and concentration problems. HOW TO MANAGE YOUR BLOOD PRESSURE Blood pressure can be managed effectively with lifestyle changes and medicines (if needed). Your caregiver will help you come up with a plan to bring your blood pressure within a normal range. Your plan should include the following: Education  Read all information provided by your caregivers about how to control blood pressure.  Educate yourself on the latest guidelines and treatment recommendations. New research is always being done to further define the risks and treatments  for high blood pressure. Lifestylechanges  Control your weight.  Avoid smoking.  Stay physically active.  Reduce the amount of salt in your diet.  Reduce stress.  Control any chronic conditions, such as high cholesterol or diabetes.  Reduce your alcohol intake. Medicines  Several medicines (antihypertensive medicines) are available, if needed, to bring blood pressure within a normal range. Communication  Review all the medicines you take with your caregiver because there may be side effects or interactions.  Talk with your caregiver about your diet, exercise habits, and other lifestyle factors that may be contributing to high blood pressure.  See your caregiver regularly. Your caregiver can help you create and adjust your plan for managing high blood pressure. RECOMMENDATIONS FOR TREATMENT AND FOLLOW-UP  The following recommendations are based on current guidelines for managing high blood pressure in nonpregnant adults. Use these recommendations to identify the proper follow-up period or treatment option based on your blood pressure reading. You can discuss these options with your caregiver.  Systolic pressure of 412 to 878 or diastolic pressure of 80 to 89: Follow up with your caregiver as directed.  Systolic pressure of 676 to 720 or diastolic pressure of 90 to 100: Follow up with your caregiver within 2 months.  Systolic pressure above 947 or diastolic pressure above 096: Follow up with your caregiver within 1 month.  Systolic pressure above 283 or diastolic pressure above 662: Consider antihypertensive therapy; follow up with your caregiver within 1 week.  Systolic pressure above 947 or diastolic pressure above 654: Begin antihypertensive therapy; follow up with your caregiver within 1 week. Document Released: 08/16/2012 Document Reviewed: 08/16/2012 [facility] Patient Information 2014 [place], [place].

## 2013-12-17 NOTE — Progress Notes (Signed)
Patient ID: Tyrone Mcdowell, male   DOB: Jun 03, 1956, 58 y.o.   MRN: [id number]  Patient Active Problem List   Diagnosis Date Noted  . Tobacco abuse counseling 12/18/2013  . Obesity, unspecified 12/17/2013  . Venous insufficiency of leg 11/12/2012  . Other and unspecified hyperlipidemia 11/12/2012  . Routine general medical examination at a health care facility 11/12/2012  . Nodular prostate with urinary retention 11/12/2012  . Hypertension 05/12/2012  . Hoarseness or changing voice 05/12/2012  . Tobacco abuse 05/12/2012  . Chronic leg pain 05/12/2012    Subjective:  CC:   Chief Complaint  Patient presents with  . Follow-up    6 month    HPI:   Tyrone Mcdowell a 58 y.o. male who presents for 6 month follow up on hypertension, hyperlipidemia and obesity. He has gained several pounds since his last visit. He is also had elevated blood pressures which he has attributed to in creased stressors at home. He and his wife are now assuming responsibility in caring for several of her grandchildren in their home during a particularly difficult time in her child's life.  He is not exercising regularly or following a caloric or carbohydrate restricted diet. He continues to smoke daily.   he has recently recovered from a flu like illness  that occurred after christmas.  His grandson had been ill with the same symptoms. He did not seek medical care at that time.    Past Medical History  Diagnosis Date  . Hypertension     Past Surgical History  Procedure Laterality Date  . Tibia fracture surgery  1989    pediedstrianj meets car   . Tibia hardware removal  1989       The following portions of the patient's history were reviewed and updated as appropriate: Allergies, current medications, and problem list.    Review of Systems:   12 Pt  review of systems was negative except those addressed in the HPI,     History   Social History  . Marital Status: Married    Spouse Name:  N/A    Number of Children: N/A  . Years of Education: N/A   Occupational History  . Not on file.   Social History Main Topics  . Smoking status: Current Some Day Smoker -- 0.50 packs/day for 32 years    Types: Cigarettes  . Smokeless tobacco: Current User    Types: Chew  . Alcohol Use: Yes  . Drug Use: No  . Sexual Activity: Not on file   Other Topics Concern  . Not on file   Social History Narrative  . No narrative on file    Objective:  Filed Vitals:   12/17/13 1541  BP: 146/92  Pulse: 86  Temp: 98.6 F (37 C)  Resp: 16     General appearance: alert, cooperative and appears stated age Ears: normal TM's and external ear canals both ears Throat: lips, mucosa, and tongue normal; teeth and gums normal Neck: no adenopathy, no carotid bruit, supple, symmetrical, trachea midline and thyroid not enlarged, symmetric, no tenderness/mass/nodules Back: symmetric, no curvature. ROM normal. No CVA tenderness. Lungs: clear to auscultation bilaterally Heart: regular rate and rhythm, S1, S2 normal, no murmur, click, rub or gallop Abdomen: soft, non-tender; bowel sounds normal; no masses,  no organomegaly Pulses: 2+ and symmetric Skin: Skin color, texture, turgor normal. No rashes or lesions Lymph nodes: Cervical, supraclavicular, and axillary nodes normal.  Assessment and Plan:  Other and unspecified hyperlipidemia He was advised  to try red yeast rice.To lower his cholesterol after last visit but has not done so.  Discussion of lipids done today and red yeast rice encouraged. We will repeat his blood work in 2-3 months and if not under control with red yeast rice we will begin statin therapy, as his risk for CAD is significant.  Chronic leg pain Secondary to remote trauma. The leg aches worse during cold weather. He's using tramadol for pain relief.  Hoarseness or changing voice ENT referral was finally accepted. There were no signs of malignancy .  Hypertension Elevated  today.  Reviewed list of meds, patient is not taking OTC meds that could be causing,. It. Have asked patient to recheck bp at home a minimum of 5 times over the next 2 weeks and add amlodipine 2.5 mg daily for persistent elevations of 140/90  Tobacco abuse Risks of continued tobacco use were discussed. he is not currently interested in tobacco cessation.   Tobacco abuse counseling The patient was counseled on the dangers of tobacco use, and was advised to quit.  Reviewed strategies to maximize success, including removing cigarettes and smoking materials from environment and stress management.  Obesity, unspecified I have addressed  BMI and recommended st loss of 50 lbs over the next 12 mlonths using a low glycemic index diet utilizing smaller more frequent meals to increase metabolism.  I have also recommended that patient start exercising with a goal of 30 minutes of aerobic exercise a minimum of 5 days per week. Repeat fasting labs to be repeated in 2 months    A total of 30 minutes was spent with patient more than half of which was spent in counseling, reviewing records from other prviders and coordination of care.  Updated Medication List Outpatient Encounter Prescriptions as of 12/17/2013  Medication Sig  . aspirin 81 MG tablet Take 81 mg by mouth daily.  . diazepam (VALIUM) 5 MG tablet TAKE ONE TABLET BY MOUTH EVERY 12 HOURS AS NEEDED FOR ANXIETY  . HYDROcodone-acetaminophen (NORCO/VICODIN) 5-325 MG per tablet Take 1 tablet by mouth every 6 (six) hours as needed.  [name] losartan-hydrochlorothiazide (HYZAAR) 100-25 MG per tablet Take 1 tablet by mouth daily.  . sertraline (ZOLOFT) 100 MG tablet TAKE ONE TABLET BY MOUTH ONCE DAILY  . tamsulosin (FLOMAX) 0.4 MG CAPS capsule Take 1 capsule (0.4 mg total) by mouth daily after breakfast.  . traMADol (ULTRAM) 50 MG tablet TAKE ONE TABLET BY MOUTH EVERY 6 HOURS AS NEEDED  . [DISCONTINUED] tamsulosin (FLOMAX) 0.4 MG CAPS Take 1 capsule (0.4 mg total)  by mouth daily after breakfast.  . [DISCONTINUED] traMADol (ULTRAM) 50 MG tablet TAKE ONE TABLET BY MOUTH EVERY 6 HOURS AS NEEDED  . Red Yeast Rice 600 MG CAPS Take 1 capsule (600 mg total) by mouth 2 (two) times daily after a meal.  . TDaP (BOOSTRIX) 5-2.5-18.5 LF-MCG/0.5 injection Inject 0.5 mLs into the muscle once.     Orders Placed This Encounter  Procedures  . Comprehensive metabolic panel  . Lipid panel    No Follow-up on file.

## 2013-12-18 ENCOUNTER — Encounter: Payer: Self-pay | Admitting: Internal Medicine

## 2013-12-18 DIAGNOSIS — Z716 Tobacco abuse counseling: Secondary | ICD-10-CM | POA: Insufficient documentation

## 2013-12-18 NOTE — Assessment & Plan Note (Addendum)
I have addressed  BMI and recommended st loss of 50 lbs over the next 12 mlonths using a low glycemic index diet utilizing smaller more frequent meals to increase metabolism.  I have also recommended that patient start exercising with a goal of 30 minutes of aerobic exercise a minimum of 5 days per week. Repeat fasting labs to be repeated in 2 months

## 2013-12-18 NOTE — Assessment & Plan Note (Signed)
Secondary to remote trauma. The leg aches worse during cold weather. He's using tramadol for pain relief.

## 2013-12-18 NOTE — Assessment & Plan Note (Signed)
Elevated today.  Reviewed list of meds, patient is not taking OTC meds that could be causing,. It. Have asked patient to recheck bp at home a minimum of 5 times over the next 2 weeks and add amlodipine 2.5 mg daily for persistent elevations of 140/90

## 2013-12-18 NOTE — Assessment & Plan Note (Signed)
The patient was counseled on the dangers of tobacco use, and was advised to quit.  Reviewed strategies to maximize success, including removing cigarettes and smoking materials from environment and stress management. 

## 2013-12-18 NOTE — Assessment & Plan Note (Signed)
ENT referral was finally accepted. There were no signs of malignancy .   

## 2013-12-18 NOTE — Assessment & Plan Note (Signed)
Risks of continued tobacco use were discussed. he is not currently interested in tobacco cessation.  

## 2013-12-18 NOTE — Assessment & Plan Note (Signed)
He was advised to try red yeast rice.To lower his cholesterol after last visit but has not done so.  Discussion of lipids done today and red yeast rice encouraged. We will repeat his blood work in 2-3 months and if not under control with red yeast rice we will begin statin therapy, as his risk for CAD is significant.

## 2014-01-05 ENCOUNTER — Telehealth: Payer: Self-pay | Admitting: Internal Medicine

## 2014-01-05 NOTE — Telephone Encounter (Signed)
Relevant patient education assigned to patient using Emmi. ° °

## 2014-01-07 ENCOUNTER — Other Ambulatory Visit: Payer: Self-pay | Admitting: Internal Medicine

## 2014-01-07 NOTE — Telephone Encounter (Signed)
Ok to refill,  printed rx  

## 2014-01-07 NOTE — Telephone Encounter (Signed)
Okay to refill? 

## 2014-01-09 ENCOUNTER — Telehealth: Payer: Self-pay | Admitting: Internal Medicine

## 2014-01-09 NOTE — Telephone Encounter (Signed)
Relevant patient education assigned to patient using Emmi. ° °

## 2014-02-11 ENCOUNTER — Other Ambulatory Visit (INDEPENDENT_AMBULATORY_CARE_PROVIDER_SITE_OTHER): Payer: Medicare Other

## 2014-02-11 DIAGNOSIS — E785 Hyperlipidemia, unspecified: Secondary | ICD-10-CM

## 2014-02-11 DIAGNOSIS — I10 Essential (primary) hypertension: Secondary | ICD-10-CM

## 2014-02-11 LAB — COMPREHENSIVE METABOLIC PANEL
ALBUMIN: 3.8 g/dL (ref 3.5–5.2)
ALK PHOS: 67 U/L (ref 39–117)
ALT: 12 U/L (ref 0–53)
AST: 15 U/L (ref 0–37)
BILIRUBIN TOTAL: 0.2 mg/dL — AB (ref 0.3–1.2)
BUN: 21 mg/dL (ref 6–23)
CO2: 32 mEq/L (ref 19–32)
Calcium: 9.3 mg/dL (ref 8.4–10.5)
Chloride: 103 mEq/L (ref 96–112)
Creatinine, Ser: 1 mg/dL (ref 0.4–1.5)
GFR: 84.28 mL/min (ref >60.00)
GLUCOSE: 94 mg/dL (ref 70–99)
POTASSIUM: 4.9 meq/L (ref 3.5–5.1)
SODIUM: 140 meq/L (ref 135–145)
TOTAL PROTEIN: 6.7 g/dL (ref 6.0–8.3)

## 2014-02-11 LAB — LIPID PANEL
CHOL/HDL RATIO: 4
CHOLESTEROL: 145 mg/dL (ref 0–200)
HDL: 36.1 mg/dL — ABNORMAL LOW (ref >39.00)
LDL CALC: 86 mg/dL (ref 0–99)
Triglycerides: 113 mg/dL (ref 0.0–149.0)
VLDL: 22.6 mg/dL (ref 0.0–40.0)

## 2014-02-13 ENCOUNTER — Encounter: Payer: Self-pay | Admitting: *Deleted

## 2014-02-18 ENCOUNTER — Other Ambulatory Visit: Payer: Self-pay | Admitting: Internal Medicine

## 2014-02-18 NOTE — Telephone Encounter (Signed)
Ok to refill,  printed rx  

## 2014-02-18 NOTE — Telephone Encounter (Signed)
Refill faxed

## 2014-02-18 NOTE — Telephone Encounter (Signed)
Last appt 12/17/13, ok refill?

## 2014-03-01 ENCOUNTER — Other Ambulatory Visit: Payer: Self-pay | Admitting: Internal Medicine

## 2014-04-10 ENCOUNTER — Telehealth: Payer: Self-pay | Admitting: *Deleted

## 2014-04-10 MED ORDER — HYDROCODONE-ACETAMINOPHEN 5-325 MG PO TABS: 1.0000 | ORAL_TABLET | Freq: Four times a day (QID) | ORAL | Status: DC | PRN

## 2014-04-10 NOTE — Telephone Encounter (Signed)
Patient notified script placed up front for pick up. 

## 2014-04-10 NOTE — Telephone Encounter (Signed)
Patient called requesting refill on Hydrocodone, last fill was 10/15/13 last OV 12/17/13 please advise?

## 2014-04-10 NOTE — Telephone Encounter (Signed)
Ok to refill,  printed rx  

## 2014-06-17 ENCOUNTER — Other Ambulatory Visit: Payer: Self-pay | Admitting: Internal Medicine

## 2014-06-24 ENCOUNTER — Ambulatory Visit (INDEPENDENT_AMBULATORY_CARE_PROVIDER_SITE_OTHER): Payer: 59 | Admitting: Internal Medicine

## 2014-06-24 ENCOUNTER — Encounter: Payer: Self-pay | Admitting: Internal Medicine

## 2014-06-24 ENCOUNTER — Telehealth: Payer: Self-pay | Admitting: Internal Medicine

## 2014-06-24 VITALS — BP 116/78 | HR 61 | Temp 97.7°F | Resp 16 | Ht 69.75 in | Wt 241.5 lb

## 2014-06-24 DIAGNOSIS — E785 Hyperlipidemia, unspecified: Secondary | ICD-10-CM

## 2014-06-24 DIAGNOSIS — E669 Obesity, unspecified: Secondary | ICD-10-CM

## 2014-06-24 DIAGNOSIS — Z1211 Encounter for screening for malignant neoplasm of colon: Secondary | ICD-10-CM

## 2014-06-24 DIAGNOSIS — R499 Unspecified voice and resonance disorder: Secondary | ICD-10-CM

## 2014-06-24 DIAGNOSIS — R498 Other voice and resonance disorders: Secondary | ICD-10-CM

## 2014-06-24 DIAGNOSIS — R338 Other retention of urine: Secondary | ICD-10-CM

## 2014-06-24 DIAGNOSIS — N400 Enlarged prostate without lower urinary tract symptoms: Secondary | ICD-10-CM

## 2014-06-24 DIAGNOSIS — N403 Nodular prostate with lower urinary tract symptoms: Secondary | ICD-10-CM

## 2014-06-24 DIAGNOSIS — N138 Other obstructive and reflux uropathy: Secondary | ICD-10-CM

## 2014-06-24 DIAGNOSIS — Z0000 Encounter for general adult medical examination without abnormal findings: Secondary | ICD-10-CM

## 2014-06-24 DIAGNOSIS — Z800 Family history of malignant neoplasm of digestive organs: Secondary | ICD-10-CM

## 2014-06-24 DIAGNOSIS — I10 Essential (primary) hypertension: Secondary | ICD-10-CM

## 2014-06-24 DIAGNOSIS — Z79899 Other long term (current) drug therapy: Secondary | ICD-10-CM

## 2014-06-24 DIAGNOSIS — Z125 Encounter for screening for malignant neoplasm of prostate: Secondary | ICD-10-CM

## 2014-06-24 DIAGNOSIS — F17200 Nicotine dependence, unspecified, uncomplicated: Secondary | ICD-10-CM

## 2014-06-24 DIAGNOSIS — Z23 Encounter for immunization: Secondary | ICD-10-CM

## 2014-06-24 DIAGNOSIS — Z720 Tobacco use: Secondary | ICD-10-CM

## 2014-06-24 LAB — COMPREHENSIVE METABOLIC PANEL
ALK PHOS: 81 U/L (ref 39–117)
ALT: 10 U/L (ref 0–53)
AST: 15 U/L (ref 0–37)
Albumin: 3.8 g/dL (ref 3.5–5.2)
BUN: 13 mg/dL (ref 6–23)
CALCIUM: 9.8 mg/dL (ref 8.4–10.5)
CHLORIDE: 103 meq/L (ref 96–112)
CO2: 30 mEq/L (ref 19–32)
Creatinine, Ser: 0.9 mg/dL (ref 0.4–1.5)
GFR: 99.19 mL/min (ref >60.00)
Glucose, Bld: 71 mg/dL (ref 70–99)
POTASSIUM: 4.5 meq/L (ref 3.5–5.1)
SODIUM: 140 meq/L (ref 135–145)
TOTAL PROTEIN: 6.7 g/dL (ref 6.0–8.3)
Total Bilirubin: 0.6 mg/dL (ref 0.2–1.2)

## 2014-06-24 LAB — PSA: PSA: 0.5 ng/mL (ref 0.10–4.00)

## 2014-06-24 MED ORDER — HYDROCODONE-ACETAMINOPHEN 5-325 MG PO TABS: 1.0000 | ORAL_TABLET | Freq: Four times a day (QID) | ORAL | Status: DC | PRN

## 2014-06-24 NOTE — Progress Notes (Signed)
Patient ID: Tyrone Mcdowell, male   DOB: 01/14/1956, 58 y.o.   MRN: [id number]   The patient is here for his annual male physical examination and management of other chronic and acute problems. His chronic leg pain has been more severe lately due to increased summertime activities.  He is using up to 6 tramadol daly on some days, and reserves the vicodin for nocturnal pain.    The risk factors are reflected in the social history.  The roster of all physicians providing medical care to patient - is listed in the Snapshot section of the chart.  Activities of daily living:  The patient is 100% independent in all ADLs: dressing, toileting, feeding as well as independent mobility  Home safety : The patient has smoke detectors in the home. He wears seatbelts.  There are no firearms at home. There is no violence in the home.   There is no risks for hepatitis, STDs or HIV. There is no   history of blood transfusion. There is no travel history to infectious disease endemic areas of the world.  The patient has seen their dentist in the last six month and  their eye doctor in the last year.  They do not  have excessive sun exposure. They have seen a dermatoloigist in the last year. Discussed the need for sun protection: hats, long sleeves and use of sunscreen if there is significant sun exposure.   Diet: the importance of a healthy diet is discussed. They do have a healthy diet.  The benefits of regular aerobic exercise were discussed. He exercises a minimum of 30 minutes  5 days per week. Depression screen: there are no signs or vegative symptoms of depression- irritability, change in appetite, anhedonia, sadness/tearfullness.  The following portions of the patient's history were reviewed and updated as appropriate: allergies, current medications, past family history, past medical history,  past surgical history, past social history  and problem list.  Visual acuity was not assessed per patient  preference since he has regular follow up with his ophthalmologist. Hearing and body mass index were assessed and reviewed.   During the course of the visit the patient was educated and counseled about appropriate screening and preventive services including :  nutrition counseling, colorectal cancer screening, and recommended immunizations.    Objective:  BP 116/78  Pulse 61  Temp(Src) 97.7 F (36.5 C) (Oral)  Resp 16  Ht 5' 9.75" (1.772 m)  Wt 241 lb 8 oz (109.544 kg)  BMI 34.89 kg/m2  SpO2 95%  General Appearance:    Alert, cooperative, no distress, appears stated age  Head:    Normocephalic, without obvious abnormality, atraumatic  Eyes:    PERRL, conjunctiva/corneas clear, EOM's intact, fundi    benign, both eyes       Ears:    Normal TM's and external ear canals, both ears  Nose:   Nares normal, septum midline, mucosa normal, no drainage   or sinus tenderness  Throat:   Lips, mucosa, and tongue normal; teeth and gums normal  Neck:   Supple, symmetrical, trachea midline, no adenopathy;       thyroid:  No enlargement/tenderness/nodules; no carotid   bruit or JVD  Back:     Symmetric, no curvature, ROM normal, no CVA tenderness  Lungs:     Clear to auscultation bilaterally, respirations unlabored  Chest wall:    No tenderness or deformity  Heart:    Regular rate and rhythm, S1 and S2 normal, no murmur, rub  or gallop  Abdomen:     Soft, non-tender, bowel sounds active all four quadrants,    no masses, no organomegaly  Genitalia:    Normal male without lesion, discharge or tenderness.  No hernias  Rectal:    Deferred given pending urology/gen surg  referral   Extremities:   Extremities normal, atraumatic, no cyanosis or edema  Pulses:   2+ and symmetric all extremities  Skin:   Skin color, texture, turgor normal, no rashes or lesions  Lymph nodes:   Cervical, supraclavicular, and axillary nodes normal  Neurologic:   CNII-XII intact. Normal strength, sensation and reflexes       throughout   Assessment and Plan:  Obesity, unspecified I have congratulated him in reduction of wt by 23 lbs with improved   BMI and encouraged  Continued weight loss with goal of 10% of body weight over the next 6 months using a low glycemic index diet and regular exercise a minimum of 5 days per week.    Nodular prostate with urinary retention He has continually deferred Urology evaluation but is now requesting referral .  Continue Flomax.  Lab Results  Component Value Date   PSA 0.50 06/24/2014   PSA 0.47 10/23/2012     Screening for colon cancer He cancelled his prior appt with Dr Jamal Collin last year but is now requesting referral for colonoscopy.   Other and unspecified hyperlipidemia He has been taking  red yeast rice and will  return for fasting labs in the dall.   Lab Results  Component Value Date   CHOL 145 02/11/2014   HDL 36.10* 02/11/2014   LDLCALC 86 02/11/2014   LDLDIRECT 142.8 10/23/2012   TRIG 113.0 02/11/2014   CHOLHDL 4 02/11/2014    Hypertension Well controlled on current regimen, but he had a syncopal episode at home recently due to dehydration.  Will dc the hctz and continue losartan only pending review of lytes/Cr Lab Results  Component Value Date   CREATININE 0.9 06/24/2014   Lab Results  Component Value Date   NA 140 06/24/2014   K 4.5 06/24/2014   CL 103 06/24/2014   CO2 30 06/24/2014   .  Tobacco abuse Risks of continued tobacco use were discussed. he is not currently interested in tobacco cessation.     Hoarseness or changing voice ENT referral was finally accepted. There were no signs of malignancy .    Routine general medical examination at a health care facility Annual male exam was done including testicular and excluding  prostate exam given urology referral. . PSA isnormal .  Colon ca screening was again initiated with referral to Dr Jamal Collin .    Updated Medication List Outpatient Encounter Prescriptions as of 06/24/2014  Medication Sig   . aspirin 81 MG tablet Take 81 mg by mouth daily.  . diazepam (VALIUM) 5 MG tablet TAKE ONE TABLET BY MOUTH EVERY 12 HOURS AS NEEDED FOR ANXIETY  . HYDROcodone-acetaminophen (NORCO/VICODIN) 5-325 MG per tablet Take 1 tablet by mouth every 6 (six) hours as needed.  . Red Yeast Rice 600 MG CAPS Take 1 capsule (600 mg total) by mouth 2 (two) times daily after a meal.  . sertraline (ZOLOFT) 100 MG tablet TAKE ONE TABLET BY MOUTH ONCE DAILY  . tamsulosin (FLOMAX) 0.4 MG CAPS capsule TAKE ONE CAPSULE BY MOUTH DAILY AFTER BREAKFAST.  [name] traMADol (ULTRAM) 50 MG tablet TAKE ONE TABLET BY MOUTH EVERY 6 HOURS AS NEEDED  . [DISCONTINUED] HYDROcodone-acetaminophen (NORCO/VICODIN) 5-325 MG per  tablet Take 1 tablet by mouth every 6 (six) hours as needed.  . [DISCONTINUED] losartan-hydrochlorothiazide (HYZAAR) 100-25 MG per tablet Take 1 tablet by mouth daily.  [name] losartan (COZAAR) 100 MG tablet Take 1 tablet (100 mg total) by mouth daily.  . TDaP (BOOSTRIX) 5-2.5-18.5 LF-MCG/0.5 injection Inject 0.5 mLs into the muscle once.

## 2014-06-24 NOTE — Patient Instructions (Signed)
You had your annual wellness exam today  You received your ten year  TDaP vaccine to protect you from tetanus,  Diptheria and whooping cough.    Please make an appt with the front desk for a fasting lab visit   And follow upin late October   ; We will contact you with the Urology,  General Surgery (Dr Jamal Collin) appts and  the bloodwork results

## 2014-06-24 NOTE — Telephone Encounter (Signed)
Relevant patient education assigned to patient using Emmi. ° °

## 2014-06-24 NOTE — Progress Notes (Signed)
Pre-visit discussion using our clinic review tool. No additional management support is needed unless otherwise documented below in the visit note.  

## 2014-06-24 NOTE — Assessment & Plan Note (Signed)
I have congratulated him in reduction of wt by 23 lbs with improved   BMI and encouraged  Continued weight loss with goal of 10% of body weight over the next 6 months using a low glycemic index diet and regular exercise a minimum of 5 days per week.

## 2014-06-25 ENCOUNTER — Encounter: Payer: Self-pay | Admitting: Internal Medicine

## 2014-06-25 DIAGNOSIS — D126 Benign neoplasm of colon, unspecified: Secondary | ICD-10-CM | POA: Insufficient documentation

## 2014-06-25 DIAGNOSIS — Z1211 Encounter for screening for malignant neoplasm of colon: Secondary | ICD-10-CM | POA: Insufficient documentation

## 2014-06-25 MED ORDER — LOSARTAN POTASSIUM 100 MG PO TABS: 100.0000 mg | ORAL_TABLET | Freq: Every day | ORAL | Status: DC

## 2014-06-25 NOTE — Assessment & Plan Note (Addendum)
He has continually deferred Urology evaluation but is now requesting referral .  Continue Flomax.  Lab Results  Component Value Date   PSA 0.50 06/24/2014   PSA 0.47 10/23/2012

## 2014-06-25 NOTE — Assessment & Plan Note (Signed)
Risks of continued tobacco use were discussed. he is not currently interested in tobacco cessation.  

## 2014-06-25 NOTE — Assessment & Plan Note (Addendum)
He has been taking  red yeast rice and will  return for fasting labs in the dall.   Lab Results  Component Value Date   CHOL 145 02/11/2014   HDL 36.10* 02/11/2014   LDLCALC 86 02/11/2014   LDLDIRECT 142.8 10/23/2012   TRIG 113.0 02/11/2014   CHOLHDL 4 02/11/2014

## 2014-06-25 NOTE — Assessment & Plan Note (Signed)
He cancelled his prior appt with Dr Jamal Collin last year but is now requesting referral for colonoscopy.

## 2014-06-25 NOTE — Assessment & Plan Note (Signed)
Annual male exam was done including testicular and excluding  prostate exam given urology referral. . PSA isnormal .  Colon ca screening was again initiated with referral to Dr Jamal Collin .

## 2014-06-25 NOTE — Assessment & Plan Note (Signed)
ENT referral was finally accepted. There were no signs of malignancy .

## 2014-06-25 NOTE — Assessment & Plan Note (Addendum)
Well controlled on current regimen, but he had a syncopal episode at home recently due to dehydration.  Will dc the hctz and continue losartan only pending review of lytes/Cr Lab Results  Component Value Date   CREATININE 0.9 06/24/2014   Lab Results  Component Value Date   NA 140 06/24/2014   K 4.5 06/24/2014   CL 103 06/24/2014   CO2 30 06/24/2014   .

## 2014-06-26 ENCOUNTER — Encounter: Payer: Self-pay | Admitting: *Deleted

## 2014-07-02 ENCOUNTER — Other Ambulatory Visit: Payer: Self-pay | Admitting: Internal Medicine

## 2014-07-02 NOTE — Telephone Encounter (Signed)
Ok to refill,  printed rx  

## 2014-07-02 NOTE — Telephone Encounter (Signed)
Last refill 4.28.15, last OV 7.20.15 next OV 10.21.15.  Please advise refill

## 2014-08-08 ENCOUNTER — Encounter: Payer: Self-pay | Admitting: *Deleted

## 2014-08-21 ENCOUNTER — Ambulatory Visit (INDEPENDENT_AMBULATORY_CARE_PROVIDER_SITE_OTHER): Payer: 59 | Admitting: General Surgery

## 2014-08-21 ENCOUNTER — Encounter: Payer: Self-pay | Admitting: General Surgery

## 2014-08-21 VITALS — BP 130/80 | HR 68 | Resp 12 | Ht 70.0 in | Wt 244.0 lb

## 2014-08-21 DIAGNOSIS — Z800 Family history of malignant neoplasm of digestive organs: Secondary | ICD-10-CM

## 2014-08-21 MED ORDER — POLYETHYLENE GLYCOL 3350 17 GM/SCOOP PO POWD: ORAL | Status: DC

## 2014-08-21 NOTE — Patient Instructions (Addendum)
Colonoscopy A colonoscopy is an exam to look at the entire large intestine (colon). This exam can help find problems such as tumors, polyps, inflammation, and areas of bleeding. The exam takes about 1 hour.  LET [facility] CARE PROVIDER KNOW ABOUT:   Any allergies you have.  All medicines you are taking, including vitamins, herbs, eye drops, creams, and over-the-counter medicines.  Previous problems you or members of your family have had with the use of anesthetics.  Any blood disorders you have.  Previous surgeries you have had.  Medical conditions you have. RISKS AND COMPLICATIONS  Generally, this is a safe procedure. However, as with any procedure, complications can occur. Possible complications include:  Bleeding.  Tearing or rupture of the colon wall.  Reaction to medicines given during the exam.  Infection (rare). BEFORE THE PROCEDURE   Ask your health care provider about changing or stopping your regular medicines.  You may be prescribed an oral bowel prep. This involves drinking a large amount of medicated liquid, starting the day before your procedure. The liquid will cause you to have multiple loose stools until your stool is almost clear or light green. This cleans out your colon in preparation for the procedure.  Do not eat or drink anything else once you have started the bowel prep, unless your health care provider tells you it is safe to do so.  Arrange for someone to drive you home after the procedure. PROCEDURE   You will be given medicine to help you relax (sedative).  You will lie on your side with your knees bent.  A long, flexible tube with a light and camera on the end (colonoscope) will be inserted through the rectum and into the colon. The camera sends video back to a computer screen as it moves through the colon. The colonoscope also releases carbon dioxide gas to inflate the colon. This helps your health care provider see the area better.  During  the exam, your health care provider may take a small tissue sample (biopsy) to be examined under a microscope if any abnormalities are found.  The exam is finished when the entire colon has been viewed. AFTER THE PROCEDURE   Do not drive for 24 hours after the exam.  You may have a small amount of blood in your stool.  You may pass moderate amounts of gas and have mild abdominal cramping or bloating. This is caused by the gas used to inflate your colon during the exam.  Ask when your test results will be ready and how you will get your results. Make sure you get your test results. Document Released: 11/19/2000 Document Revised: 09/12/2013 Document Reviewed: 07/30/2013 [facility] Patient Information 2015 [place], [place]. This information is not intended to replace advice given to you by your health care provider. Make sure you discuss any questions you have with your health care provider.  Patient has been scheduled for a colonoscopy on 09-04-14 at [facility]. It is okay for patient to continue 81 mg aspirin.
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
## 2014-08-23 ENCOUNTER — Telehealth: Payer: Self-pay | Admitting: Internal Medicine

## 2014-08-23 MED ORDER — HYDROCODONE-ACETAMINOPHEN 10-325 MG PO TABS: 1.0000 | ORAL_TABLET | Freq: Three times a day (TID) | ORAL | Status: DC | PRN

## 2014-08-23 NOTE — Telephone Encounter (Signed)
Ok to fill 

## 2014-08-23 NOTE — Telephone Encounter (Signed)
Yes, I have increased hydrocodone to 10/325

## 2014-08-23 NOTE — Telephone Encounter (Signed)
Patient notified and script placed at front desk. 

## 2014-08-23 NOTE — Telephone Encounter (Signed)
Pt came in and stated he is having dental work done next week and would like to have a refill HYDROcodone-acetaminophen (NORCO/VICODIN) 5-325 MG per tablet and was also wondering if he would be able to get a stronger dosage.

## 2014-08-28 ENCOUNTER — Other Ambulatory Visit: Payer: Self-pay | Admitting: Internal Medicine

## 2014-08-28 NOTE — Telephone Encounter (Signed)
Rx faxed

## 2014-08-28 NOTE — Telephone Encounter (Signed)
Ok to refill,  Refill sent  

## 2014-08-28 NOTE — Telephone Encounter (Signed)
Last refill 6.16.15, last OV 7.20.15.  please advise refill.

## 2014-09-04 ENCOUNTER — Ambulatory Visit: Payer: Self-pay | Admitting: General Surgery

## 2014-09-04 DIAGNOSIS — Z1211 Encounter for screening for malignant neoplasm of colon: Secondary | ICD-10-CM

## 2014-09-04 DIAGNOSIS — K5730 Diverticulosis of large intestine without perforation or abscess without bleeding: Secondary | ICD-10-CM

## 2014-09-05 ENCOUNTER — Encounter: Payer: Self-pay | Admitting: General Surgery

## 2014-09-25 ENCOUNTER — Ambulatory Visit: Payer: Medicare Other | Admitting: Internal Medicine

## 2014-09-25 DIAGNOSIS — Z0289 Encounter for other administrative examinations: Secondary | ICD-10-CM

## 2014-10-15 ENCOUNTER — Encounter: Payer: Self-pay | Admitting: Internal Medicine

## 2014-10-15 ENCOUNTER — Ambulatory Visit (INDEPENDENT_AMBULATORY_CARE_PROVIDER_SITE_OTHER): Payer: Medicare Other | Admitting: *Deleted

## 2014-10-15 ENCOUNTER — Ambulatory Visit (INDEPENDENT_AMBULATORY_CARE_PROVIDER_SITE_OTHER): Payer: Medicare Other | Admitting: Internal Medicine

## 2014-10-15 VITALS — BP 160/104 | HR 54 | Temp 98.6°F | Resp 16 | Ht 70.0 in | Wt 243.5 lb

## 2014-10-15 DIAGNOSIS — E785 Hyperlipidemia, unspecified: Secondary | ICD-10-CM

## 2014-10-15 DIAGNOSIS — I10 Essential (primary) hypertension: Secondary | ICD-10-CM

## 2014-10-15 DIAGNOSIS — E669 Obesity, unspecified: Secondary | ICD-10-CM

## 2014-10-15 DIAGNOSIS — Z87891 Personal history of nicotine dependence: Secondary | ICD-10-CM

## 2014-10-15 DIAGNOSIS — Z23 Encounter for immunization: Secondary | ICD-10-CM

## 2014-10-15 MED ORDER — TRAMADOL HCL 50 MG PO TABS: ORAL_TABLET | ORAL | Status: DC

## 2014-10-15 MED ORDER — HYDROCODONE-ACETAMINOPHEN 10-325 MG PO TABS: 1.0000 | ORAL_TABLET | Freq: Four times a day (QID) | ORAL | Status: DC | PRN

## 2014-10-15 MED ORDER — HYDROCODONE-ACETAMINOPHEN 10-325 MG PO TABS: 1.0000 | ORAL_TABLET | Freq: Three times a day (TID) | ORAL | Status: DC | PRN

## 2014-10-15 NOTE — Assessment & Plan Note (Addendum)
eldevated today . He has been asked to have BP checked at home several times over the next 2 weeks/  If > 130/80 ,  Will add amlodipine .  Prior use of HCTZ led to dehydration

## 2014-10-15 NOTE — Patient Instructions (Addendum)
BP is up today  Have Tyrone Mcdowell check a few times over the next 3 weeks  If > 130/80  Call and we'll add 5 mg amlodipine   To your losartan   Return for fasting labs in January
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
## 2014-10-16 ENCOUNTER — Encounter: Payer: Self-pay | Admitting: Internal Medicine

## 2014-10-16 NOTE — Assessment & Plan Note (Signed)
I have addressed  BMI and recommended wt loss of 10% of body weight over the next 6 months using a low glycemic index diet and regular exercise a minimum of 5 days per week.   

## 2014-10-16 NOTE — Assessment & Plan Note (Signed)
Patient congratulated on quitting tobacco and encouraged to find alternative activities to fill time.

## 2014-12-16 ENCOUNTER — Other Ambulatory Visit: Payer: Medicare Other

## 2015-02-13 ENCOUNTER — Other Ambulatory Visit: Payer: Self-pay | Admitting: Internal Medicine

## 2015-02-13 MED ORDER — HYDROCODONE-ACETAMINOPHEN 10-325 MG PO TABS: 1.0000 | ORAL_TABLET | Freq: Four times a day (QID) | ORAL | Status: DC | PRN

## 2015-02-13 NOTE — Telephone Encounter (Signed)
Called to pharmacy. Hydrocodone Rx set up front for pick up tomorrow.

## 2015-02-13 NOTE — Telephone Encounter (Signed)
Ok to refill,  printed rx s,  labs are entered

## 2015-02-13 NOTE — Telephone Encounter (Signed)
Pt left VM, requesting refill on his Hydrocodone, states has a lab appt tomorrow morning at 8:45 and would like to pick up then if possible

## 2015-02-13 NOTE — Telephone Encounter (Signed)
Last visit 10/15/14

## 2015-02-14 ENCOUNTER — Other Ambulatory Visit (INDEPENDENT_AMBULATORY_CARE_PROVIDER_SITE_OTHER): Payer: 59

## 2015-02-14 DIAGNOSIS — I10 Essential (primary) hypertension: Secondary | ICD-10-CM

## 2015-02-14 DIAGNOSIS — E785 Hyperlipidemia, unspecified: Secondary | ICD-10-CM

## 2015-02-14 LAB — COMPREHENSIVE METABOLIC PANEL
ALT: 17 U/L (ref 0–53)
AST: 13 U/L (ref 0–37)
Albumin: 4.5 g/dL (ref 3.5–5.2)
Alkaline Phosphatase: 99 U/L (ref 39–117)
BUN: 11 mg/dL (ref 6–23)
CHLORIDE: 103 meq/L (ref 96–112)
CO2: 30 meq/L (ref 19–32)
Calcium: 10 mg/dL (ref 8.4–10.5)
Creatinine, Ser: 0.92 mg/dL (ref 0.40–1.50)
GFR: 90.32 mL/min (ref >60.00)
Glucose, Bld: 94 mg/dL (ref 70–99)
Potassium: 4.1 mEq/L (ref 3.5–5.1)
Sodium: 140 mEq/L (ref 135–145)
Total Bilirubin: 0.6 mg/dL (ref 0.2–1.2)
Total Protein: 7.3 g/dL (ref 6.0–8.3)

## 2015-02-14 LAB — LIPID PANEL
Cholesterol: 189 mg/dL (ref 0–200)
HDL: 42.9 mg/dL (ref >39.00)
LDL Cholesterol: 126 mg/dL — ABNORMAL HIGH (ref 0–99)
NONHDL: 146.1
TRIGLYCERIDES: 103 mg/dL (ref 0.0–149.0)
Total CHOL/HDL Ratio: 4
VLDL: 20.6 mg/dL (ref 0.0–40.0)

## 2015-02-14 LAB — MICROALBUMIN / CREATININE URINE RATIO
CREATININE, U: 297.4 mg/dL
Microalb Creat Ratio: 1.4 mg/g (ref 0.0–30.0)
Microalb, Ur: 4.3 mg/dL — ABNORMAL HIGH (ref 0.0–1.9)

## 2015-03-03 ENCOUNTER — Emergency Department: Payer: Self-pay | Admitting: Emergency Medicine

## 2015-04-09 ENCOUNTER — Telehealth: Payer: Self-pay | Admitting: Internal Medicine

## 2015-04-09 NOTE — Telephone Encounter (Signed)
Patient called and left message needs medication refill, requested  For mdedication used for break through pain  tried to reach patient to verify which one tramadol or Hydrocodone.

## 2015-04-10 MED ORDER — HYDROCODONE-ACETAMINOPHEN 10-325 MG PO TABS: 1.0000 | ORAL_TABLET | Freq: Four times a day (QID) | ORAL | Status: DC | PRN

## 2015-04-10 NOTE — Telephone Encounter (Signed)
He needs to have an OV prior to any more refills on vicodin ,  rx printed

## 2015-04-10 NOTE — Telephone Encounter (Signed)
Patient needs appointment for further efills patient notified and ask front desk to schedule when patient picks up script.

## 2015-04-10 NOTE — Telephone Encounter (Signed)
Patient returned call and advised needs refill on Hydrocodone ok to fill 02/13/15.

## 2015-04-21 ENCOUNTER — Encounter: Payer: Self-pay | Admitting: Internal Medicine

## 2015-04-21 ENCOUNTER — Ambulatory Visit (INDEPENDENT_AMBULATORY_CARE_PROVIDER_SITE_OTHER): Payer: 59 | Admitting: Internal Medicine

## 2015-04-21 VITALS — BP 148/100 | HR 55 | Temp 97.6°F | Resp 16 | Ht 70.0 in | Wt 252.5 lb

## 2015-04-21 DIAGNOSIS — E785 Hyperlipidemia, unspecified: Secondary | ICD-10-CM | POA: Diagnosis not present

## 2015-04-21 DIAGNOSIS — E669 Obesity, unspecified: Secondary | ICD-10-CM | POA: Diagnosis not present

## 2015-04-21 DIAGNOSIS — Z79899 Other long term (current) drug therapy: Secondary | ICD-10-CM | POA: Diagnosis not present

## 2015-04-21 DIAGNOSIS — M79602 Pain in left arm: Secondary | ICD-10-CM

## 2015-04-21 DIAGNOSIS — M79605 Pain in left leg: Secondary | ICD-10-CM

## 2015-04-21 DIAGNOSIS — I10 Essential (primary) hypertension: Secondary | ICD-10-CM

## 2015-04-21 DIAGNOSIS — G8929 Other chronic pain: Secondary | ICD-10-CM

## 2015-04-21 MED ORDER — HYDROCODONE-ACETAMINOPHEN 10-325 MG PO TABS: 1.0000 | ORAL_TABLET | Freq: Four times a day (QID) | ORAL | Status: DC | PRN

## 2015-04-21 MED ORDER — AMLODIPINE BESYLATE 2.5 MG PO TABS: 2.5000 mg | ORAL_TABLET | Freq: Every day | ORAL | Status: DC

## 2015-04-21 MED ORDER — ATORVASTATIN CALCIUM 20 MG PO TABS: 20.0000 mg | ORAL_TABLET | Freq: Every day | ORAL | Status: DC

## 2015-04-21 NOTE — Progress Notes (Signed)
Pre-visit discussion using our clinic review tool. No additional management support is needed unless otherwise documented below in the visit note.  

## 2015-04-21 NOTE — Patient Instructions (Signed)
Adding  amlodipine 2.5 mg daily for better BP control   adding generic lipitor 20 mg at bedtime for reduction of cardiac risk of 16% based on Framingham  Calculator    Return in  6 weeks to check liver enzymes fasting

## 2015-04-21 NOTE — Assessment & Plan Note (Addendum)
elevated today . He has been asked to have BP checked at home several times over the next 2 weeks/  If > 130/80 ,  Will add amlodipine .  Prior use of HCT led to dehydration .  Lab Results  Component Value Date   CREATININE 0.92 02/14/2015   Lab Results  Component Value Date   NA 140 02/14/2015   K 4.1 02/14/2015   CL 103 02/14/2015   CO2 30 02/14/2015
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
## 2015-04-22 NOTE — Assessment & Plan Note (Addendum)
Based on current lipid profile, the risk of clinically significant CAD is 16% over the next 10 years, using the Framingham risk calculator. The [employer] recommends starting patients aged 57 or higher on moderate intensity statin therapy for LDL between 70-189 and 10 yr risk of CAD > 7.5% ;  Adding low dose atorvastatin today ,  Medication discussed,  Return for liver enzymes testing in 6 week.s  Lab Results  Component Value Date   CHOL 189 02/14/2015   HDL 42.90 02/14/2015   LDLCALC 126* 02/14/2015   LDLDIRECT 142.8 10/23/2012   TRIG 103.0 02/14/2015   CHOLHDL 4 02/14/2015   Lab Results  Component Value Date   ALT 17 02/14/2015   AST 13 02/14/2015   ALKPHOS 99 02/14/2015   BILITOT 0.6 02/14/2015
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
## 2015-04-22 NOTE — Assessment & Plan Note (Signed)
I have addressed  BMI and recommended a low glycemic index diet utilizing smaller more frequent meals to increase metabolism.  I have also recommended that patient start exercising with a goal of 30 minutes of aerobic exercise a minimum of 5 days per week. Screening for lipid disorders, thyroid and diabetes has  been done  °

## 2015-05-07 ENCOUNTER — Other Ambulatory Visit: Payer: Self-pay | Admitting: Internal Medicine

## 2015-05-07 NOTE — Telephone Encounter (Signed)
Last OV 5.16.16, next OV 6.27.16.  Ok to fill?

## 2015-05-07 NOTE — Telephone Encounter (Signed)
Ok to refill,  Refill sent  

## 2015-05-30 ENCOUNTER — Telehealth: Payer: Self-pay | Admitting: *Deleted

## 2015-05-30 DIAGNOSIS — I10 Essential (primary) hypertension: Secondary | ICD-10-CM

## 2015-05-30 MED ORDER — HYDROCODONE-ACETAMINOPHEN 10-325 MG PO TABS: 1.0000 | ORAL_TABLET | Freq: Four times a day (QID) | ORAL | Status: DC | PRN

## 2015-05-30 NOTE — Telephone Encounter (Signed)
Pt called requesting Hydrocodone refill.  Pt last OV and refill 5.16.16.  Please advise refill

## 2015-05-30 NOTE — Telephone Encounter (Signed)
90 day supply authorized and printed  

## 2015-05-30 NOTE — Telephone Encounter (Signed)
Spoke with pt, advised Rxs will be redy for pick up this afternoon.

## 2015-06-02 ENCOUNTER — Other Ambulatory Visit (INDEPENDENT_AMBULATORY_CARE_PROVIDER_SITE_OTHER): Payer: 59

## 2015-06-02 DIAGNOSIS — Z79899 Other long term (current) drug therapy: Secondary | ICD-10-CM

## 2015-06-02 DIAGNOSIS — E785 Hyperlipidemia, unspecified: Secondary | ICD-10-CM | POA: Diagnosis not present

## 2015-06-02 LAB — COMPREHENSIVE METABOLIC PANEL
ALK PHOS: 141 U/L — AB (ref 39–117)
ALT: 121 U/L — ABNORMAL HIGH (ref 0–53)
AST: 189 U/L — ABNORMAL HIGH (ref 0–37)
Albumin: 4.1 g/dL (ref 3.5–5.2)
BILIRUBIN TOTAL: 0.5 mg/dL (ref 0.2–1.2)
BUN: 13 mg/dL (ref 6–23)
CO2: 32 mEq/L (ref 19–32)
CREATININE: 0.89 mg/dL (ref 0.40–1.50)
Calcium: 9.4 mg/dL (ref 8.4–10.5)
Chloride: 101 mEq/L (ref 96–112)
GFR: 93.75 mL/min (ref >60.00)
Glucose, Bld: 76 mg/dL (ref 70–99)
Potassium: 4.4 mEq/L (ref 3.5–5.1)
Sodium: 137 mEq/L (ref 135–145)
TOTAL PROTEIN: 7.2 g/dL (ref 6.0–8.3)

## 2015-06-02 LAB — LIPID PANEL
Cholesterol: 137 mg/dL (ref 0–200)
HDL: 41.3 mg/dL (ref >39.00)
LDL CALC: 80 mg/dL (ref 0–99)
NonHDL: 95.7
TRIGLYCERIDES: 81 mg/dL (ref 0.0–149.0)
Total CHOL/HDL Ratio: 3
VLDL: 16.2 mg/dL (ref 0.0–40.0)

## 2015-06-05 ENCOUNTER — Other Ambulatory Visit: Payer: Self-pay | Admitting: Internal Medicine

## 2015-06-05 ENCOUNTER — Encounter: Payer: Self-pay | Admitting: Internal Medicine

## 2015-06-05 DIAGNOSIS — R748 Abnormal levels of other serum enzymes: Secondary | ICD-10-CM | POA: Insufficient documentation

## 2015-06-11 ENCOUNTER — Other Ambulatory Visit (INDEPENDENT_AMBULATORY_CARE_PROVIDER_SITE_OTHER): Payer: 59

## 2015-06-11 DIAGNOSIS — R748 Abnormal levels of other serum enzymes: Secondary | ICD-10-CM | POA: Diagnosis not present

## 2015-06-11 LAB — HEPATIC FUNCTION PANEL
ALBUMIN: 4.2 g/dL (ref 3.5–5.2)
ALT: 37 U/L (ref 0–53)
AST: 20 U/L (ref 0–37)
Alkaline Phosphatase: 126 U/L — ABNORMAL HIGH (ref 39–117)
BILIRUBIN TOTAL: 0.3 mg/dL (ref 0.2–1.2)
Bilirubin, Direct: 0 mg/dL (ref 0.0–0.3)
TOTAL PROTEIN: 7.9 g/dL (ref 6.0–8.3)

## 2015-06-14 NOTE — Addendum Note (Signed)
Addended by: [name] on: 06/14/2015 04:31 PM   Modules accepted: Orders

## 2015-06-26 ENCOUNTER — Other Ambulatory Visit: Payer: Self-pay | Admitting: Internal Medicine

## 2015-06-26 NOTE — Telephone Encounter (Signed)
Last OV 5.16.16.  Please advise refill

## 2015-06-27 NOTE — Telephone Encounter (Signed)
Ok to refill,  Authorized in epic 

## 2015-06-27 NOTE — Telephone Encounter (Signed)
Left message on Vm that Rx has been called in to pharmacy

## 2015-07-21 ENCOUNTER — Other Ambulatory Visit (INDEPENDENT_AMBULATORY_CARE_PROVIDER_SITE_OTHER): Payer: 59

## 2015-07-21 DIAGNOSIS — R748 Abnormal levels of other serum enzymes: Secondary | ICD-10-CM | POA: Diagnosis not present

## 2015-07-21 LAB — HEPATIC FUNCTION PANEL
ALT: 34 U/L (ref 0–53)
AST: 23 U/L (ref 0–37)
Albumin: 3.8 g/dL (ref 3.5–5.2)
Alkaline Phosphatase: 109 U/L (ref 39–117)
BILIRUBIN TOTAL: 0.4 mg/dL (ref 0.2–1.2)
Bilirubin, Direct: 0.1 mg/dL (ref 0.0–0.3)
Total Protein: 6.7 g/dL (ref 6.0–8.3)

## 2015-07-23 ENCOUNTER — Encounter: Payer: Self-pay | Admitting: *Deleted

## 2015-08-08 ENCOUNTER — Other Ambulatory Visit: Payer: Self-pay | Admitting: Internal Medicine

## 2015-08-08 NOTE — Telephone Encounter (Signed)
RX for Losartan sent to pharmacy

## 2015-09-04 ENCOUNTER — Other Ambulatory Visit: Payer: Self-pay

## 2015-09-04 NOTE — Telephone Encounter (Signed)
According to his chart , he  Uses 2 vicodin daily maximum, and was given an rx for refill on or after August 24th for #120,  so he cannot have a refill until Oct 24th .

## 2015-09-04 NOTE — Telephone Encounter (Signed)
Please advise refill? Last OV was 04/21/15.  Last refilled 05/30/15 for 90days and no refills.

## 2015-09-05 ENCOUNTER — Other Ambulatory Visit: Payer: Self-pay

## 2015-09-05 ENCOUNTER — Telehealth: Payer: Self-pay

## 2015-09-05 NOTE — Telephone Encounter (Signed)
Pt called for a rx refill for Norco.

## 2015-09-05 NOTE — Telephone Encounter (Signed)
See prior response to refill request from yesterday

## 2015-09-05 NOTE — Telephone Encounter (Signed)
Pt called wanting a refill on this medication. Please advise?

## 2015-09-11 NOTE — Telephone Encounter (Signed)
Patient called triage line again to check on his prescription explained that he was not able to refill it until October 24th.

## 2015-09-19 ENCOUNTER — Other Ambulatory Visit: Payer: Self-pay | Admitting: Internal Medicine

## 2015-09-19 MED ORDER — TRAMADOL HCL 50 MG PO TABS: 50.0000 mg | ORAL_TABLET | Freq: Four times a day (QID) | ORAL | Status: DC | PRN

## 2015-09-19 NOTE — Telephone Encounter (Signed)
90 day supply authorized . Needs 6 month follow up OV prior to next refill   

## 2015-09-19 NOTE — Telephone Encounter (Signed)
Please advise- Refill.

## 2015-09-29 ENCOUNTER — Telehealth: Payer: Self-pay | Admitting: Internal Medicine

## 2015-09-29 ENCOUNTER — Other Ambulatory Visit: Payer: Self-pay

## 2015-09-29 ENCOUNTER — Other Ambulatory Visit: Payer: Self-pay | Admitting: Internal Medicine

## 2015-09-29 MED ORDER — HYDROCODONE-ACETAMINOPHEN 10-325 MG PO TABS: 1.0000 | ORAL_TABLET | Freq: Four times a day (QID) | ORAL | Status: DC | PRN

## 2015-09-29 NOTE — Telephone Encounter (Signed)
Pt called to request a refill on his pain meds. Please advise pt/msn

## 2015-09-29 NOTE — Telephone Encounter (Deleted)
See prior message

## 2015-09-29 NOTE — Telephone Encounter (Signed)
Please advise refill? 

## 2015-09-29 NOTE — Telephone Encounter (Signed)
Spoke with Patient, he will pick it up in the morning. Already has a follow up appointment for November with you.

## 2015-09-29 NOTE — Telephone Encounter (Signed)
REFILL FOR 60 DAYS #120 TABELTS RX PRINTED  NEEDS OV PRIOR TO NEXT REFILL

## 2015-10-15 ENCOUNTER — Other Ambulatory Visit: Payer: Self-pay | Admitting: Internal Medicine

## 2015-10-16 NOTE — Telephone Encounter (Signed)
Ok to refill,  printed rx  

## 2015-10-21 ENCOUNTER — Ambulatory Visit: Payer: 59 | Admitting: Internal Medicine

## 2015-11-11 ENCOUNTER — Ambulatory Visit
Admission: RE | Admit: 2015-11-11 | Discharge: 2015-11-11 | Disposition: A | Discharge status: Home / Self Care | Payer: 59 | Source: Ambulatory Visit | Attending: Internal Medicine | Admitting: Internal Medicine

## 2015-11-11 ENCOUNTER — Encounter: Payer: Self-pay | Admitting: Internal Medicine

## 2015-11-11 ENCOUNTER — Ambulatory Visit (INDEPENDENT_AMBULATORY_CARE_PROVIDER_SITE_OTHER): Payer: 59 | Admitting: Internal Medicine

## 2015-11-11 VITALS — BP 230/130 | HR 59 | Temp 98.3°F | Resp 14 | Ht 70.0 in | Wt 256.2 lb

## 2015-11-11 DIAGNOSIS — Z23 Encounter for immunization: Secondary | ICD-10-CM | POA: Diagnosis not present

## 2015-11-11 DIAGNOSIS — M79602 Pain in left arm: Secondary | ICD-10-CM | POA: Diagnosis not present

## 2015-11-11 DIAGNOSIS — M5440 Lumbago with sciatica, unspecified side: Secondary | ICD-10-CM

## 2015-11-11 DIAGNOSIS — M79605 Pain in left leg: Secondary | ICD-10-CM

## 2015-11-11 DIAGNOSIS — M25552 Pain in left hip: Secondary | ICD-10-CM | POA: Diagnosis not present

## 2015-11-11 DIAGNOSIS — G8929 Other chronic pain: Secondary | ICD-10-CM | POA: Insufficient documentation

## 2015-11-11 DIAGNOSIS — I10 Essential (primary) hypertension: Secondary | ICD-10-CM | POA: Diagnosis not present

## 2015-11-11 DIAGNOSIS — M5442 Lumbago with sciatica, left side: Secondary | ICD-10-CM | POA: Insufficient documentation

## 2015-11-11 LAB — COMPREHENSIVE METABOLIC PANEL
ALK PHOS: 89 U/L (ref 39–117)
ALT: 17 U/L (ref 0–53)
AST: 19 U/L (ref 0–37)
Albumin: 4.3 g/dL (ref 3.5–5.2)
BILIRUBIN TOTAL: 0.3 mg/dL (ref 0.2–1.2)
BUN: 13 mg/dL (ref 6–23)
CALCIUM: 9.8 mg/dL (ref 8.4–10.5)
CO2: 30 meq/L (ref 19–32)
Chloride: 103 mEq/L (ref 96–112)
Creatinine, Ser: 0.83 mg/dL (ref 0.40–1.50)
GFR: 101.45 mL/min (ref >60.00)
GLUCOSE: 96 mg/dL (ref 70–99)
POTASSIUM: 4.2 meq/L (ref 3.5–5.1)
Sodium: 140 mEq/L (ref 135–145)
Total Protein: 7.5 g/dL (ref 6.0–8.3)

## 2015-11-11 LAB — MICROALBUMIN / CREATININE URINE RATIO
CREATININE, U: 36.6 mg/dL
MICROALB/CREAT RATIO: 1.9 mg/g (ref 0.0–30.0)
Microalb, Ur: <0.7 mg/dL (ref 0.0–1.9)

## 2015-11-11 MED ORDER — HYDROCODONE-ACETAMINOPHEN 10-325 MG PO TABS: 1.0000 | ORAL_TABLET | Freq: Four times a day (QID) | ORAL | Status: DC | PRN

## 2015-11-11 MED ORDER — CLONIDINE HCL 0.1 MG PO TABS
0.1000 mg | ORAL_TABLET | Freq: Once | ORAL | Status: AC
  Administered 2015-11-11: 0.1 mg via ORAL

## 2015-11-11 MED ORDER — TRAMADOL HCL 50 MG PO TABS: 50.0000 mg | ORAL_TABLET | Freq: Four times a day (QID) | ORAL | Status: DC | PRN

## 2015-11-11 MED ORDER — FUROSEMIDE 20 MG PO TABS: 20.0000 mg | ORAL_TABLET | Freq: Every day | ORAL | Status: DC

## 2015-11-11 MED ORDER — AMLODIPINE BESYLATE 10 MG PO TABS: 10.0000 mg | ORAL_TABLET | Freq: Every day | ORAL | Status: DC

## 2015-11-11 NOTE — Progress Notes (Signed)
Subjective:  Patient ID: Tyrone Mcdowell, male    DOB: [birth year]  Age: 59 y.o. MRN: [id number]  CC: The primary encounter diagnosis was Acute left-sided low back pain with sciatica, sciatica laterality unspecified. Diagnoses of Essential hypertension, malignant, Need for prophylactic vaccination and inoculation against influenza, Essential hypertension, and Chronic leg pain, left were also pertinent to this visit.  HPI Tyrone Mcdowell presents for evaluation of severe left hip pain .  He ran ou t of his hydrocodone a week early due to increased use of medation due to pain  He has been out of oxycodone for over a week,  His pain radiates down his left leg.  No history of fall  Or unusual activity  Uncontrolled hypertension .  He has been taking Aleve daily for the past month  And has been taking his medicatioms for BP as directed. Denies headache, vision changes, chest pain and shortness of breath  Outpatient Prescriptions Prior to Visit  Medication Sig Dispense Refill  . aspirin 81 MG tablet Take 81 mg by mouth daily.    [name] atorvastatin (LIPITOR) 20 MG tablet Take 1 tablet (20 mg total) by mouth daily. 90 tablet 3  . diazepam (VALIUM) 5 MG tablet TAKE 1 TABLET BY MOUTH EVERY 12 HOURS AS NEEDED FOR ANXIETY 60 tablet 2  . losartan (COZAAR) 100 MG tablet TAKE 1 TABLET (100 MG TOTAL) BY MOUTH DAILY. 90 tablet 1  . polyethylene glycol powder (GLYCOLAX/MIRALAX) powder 255 grams one bottle for colonoscopy prep 255 g 0  . RAPAFLO 8 MG CAPS capsule Take 1 capsule by mouth daily.  11  . Red Yeast Rice 600 MG CAPS Take 1 capsule (600 mg total) by mouth 2 (two) times daily after a meal. 180 capsule 1  . sertraline (ZOLOFT) 100 MG tablet TAKE ONE TABLET BY MOUTH ONCE DAILY 90 tablet 2  . TDaP (BOOSTRIX) 5-2.5-18.5 LF-MCG/0.5 injection Inject 0.5 mLs into the muscle once. 0.5 mL 0  . amLODipine (NORVASC) 2.5 MG tablet Take 1 tablet (2.5 mg total) by mouth daily. 90 tablet 3  . HYDROcodone-acetaminophen  (NORCO) 10-325 MG tablet Take 1 tablet by mouth every 6 (six) hours as needed. Maximum two daily  60 DAY SUPPLY 120 tablet 0  . traMADol (ULTRAM) 50 MG tablet Take 1 tablet (50 mg total) by mouth every 6 (six) hours as needed. 360 tablet 0   No facility-administered medications prior to visit.    Review of Systems;  Patient denies headache, fevers, malaise, unintentional weight loss, skin rash, eye pain, sinus congestion and sinus pain, sore throat, dysphagia,  hemoptysis , cough, dyspnea, wheezing, chest pain, palpitations, orthopnea, edema, abdominal pain, nausea, melena, diarrhea, constipation, flank pain, dysuria, hematuria, urinary  Frequency, nocturia, numbness, tingling, seizures,  Focal weakness, Loss of consciousness,  Tremor, insomnia, depression, anxiety, and suicidal ideation.      Objective:  BP 230/130 mmHg  Pulse 59  Temp(Src) 98.3 F (36.8 C) (Oral)  Resp 14  Ht 5\' 10"  (1.778 m)  Wt 256 lb 4 oz (116.234 kg)  BMI 36.77 kg/m2  SpO2 96%  BP Readings from Last 3 Encounters:  11/11/15 230/130  04/21/15 148/100  10/15/14 160/104    Wt Readings from Last 3 Encounters:  11/11/15 256 lb 4 oz (116.234 kg)  04/21/15 252 lb 8 oz (114.533 kg)  10/15/14 243 lb 8 oz (110.451 kg)    General appearance: alert, cooperative and appears stated age Ears: normal TM's and external ear canals both ears  Throat: lips, mucosa, and tongue normal; teeth and gums normal Neck: no adenopathy, no carotid bruit, supple, symmetrical, trachea midline and thyroid not enlarged, symmetric, no tenderness/mass/nodules Back: symmetric, no curvature. ROM normal. No CVA tenderness. Lungs: clear to auscultation bilaterally Heart: regular rate and rhythm, S1, S2 normal, no murmur, click, rub or gallop Abdomen: soft, non-tender; bowel sounds normal; no masses,  no organomegaly Pulses: 2+ and symmetric Skin: Skin color, texture, turgor normal. No rashes or lesions Lymph nodes: Cervical,  supraclavicular, and axillary nodes normal.  No results found for: HGBA1C  Lab Results  Component Value Date   CREATININE 0.83 11/11/2015   CREATININE 0.89 06/02/2015   CREATININE 0.92 02/14/2015    Lab Results  Component Value Date   WBC 6.8 05/03/2012   HGB 14.3 05/03/2012   HCT 43.5 05/03/2012   PLT 251 05/03/2012   GLUCOSE 96 11/11/2015   CHOL 137 06/02/2015   TRIG 81.0 06/02/2015   HDL 41.30 06/02/2015   LDLDIRECT 142.8 10/23/2012   LDLCALC 80 06/02/2015   ALT 17 11/11/2015   AST 19 11/11/2015   NA 140 11/11/2015   K 4.2 11/11/2015   CL 103 11/11/2015   CREATININE 0.83 11/11/2015   BUN 13 11/11/2015   CO2 30 11/11/2015   PSA 0.50 06/24/2014   MICROALBUR <0.7 11/11/2015    No results found.  Assessment & Plan:   Problem List Items Addressed This Visit    Hypertension   Relevant Medications   furosemide (LASIX) 20 MG tablet   amLODipine (NORVASC) 10 MG tablet   cloNIDine (CATAPRES) tablet 0.1 mg (Completed)   Chronic leg pain    Increased in severity . Plain films of hip and lumbar spine  Suggest a lower spine issue.  MRI ordered        Other Visit Diagnoses    Acute left-sided low back pain with sciatica, sciatica laterality unspecified    -  Primary    Relevant Orders    DG HIP UNILAT WITH PELVIS 2-3 VIEWS LEFT (Completed)    DG Lumbar Spine Complete (Completed)    Essential hypertension, malignant        Relevant Medications    furosemide (LASIX) 20 MG tablet    amLODipine (NORVASC) 10 MG tablet    cloNIDine (CATAPRES) tablet 0.1 mg (Completed)    Other Relevant Orders    Comprehensive metabolic panel (Completed)    Microalbumin / creatinine urine ratio (Completed)    Need for prophylactic vaccination and inoculation against influenza        Relevant Orders    Flu Vaccine QUAD 36+ mos IM (Completed)     A total of 25 minutes of face to face time was spent with patient more than half of which was spent in counselling about the above mentioned  conditions  and coordination of care   I have changed Tyrone Mcdowell traMADol and amLODipine. I am also having him start on furosemide. Additionally, I am having him maintain his aspirin, Tdap, Red Yeast Rice, polyethylene glycol powder, RAPAFLO, atorvastatin, sertraline, losartan, diazepam, and HYDROcodone-acetaminophen. We administered cloNIDine.  Meds ordered this encounter  Medications  . furosemide (LASIX) 20 MG tablet    Sig: Take 1 tablet (20 mg total) by mouth daily.    Dispense:  30 tablet    Refill:  3  . HYDROcodone-acetaminophen (NORCO) 10-325 MG tablet    Sig: Take 1 tablet by mouth every 6 (six) hours as needed. Maximum two daily  60 DAY SUPPLY  Dispense:  120 tablet    Refill:  0  . traMADol (ULTRAM) 50 MG tablet    Sig: Take 1 tablet (50 mg total) by mouth every 6 (six) hours as needed. MAXIMUM 4 DAILY    Dispense:  120 tablet    Refill:  3    90 DAY SUPPLY  . amLODipine (NORVASC) 10 MG tablet    Sig: Take 1 tablet (10 mg total) by mouth daily.    Dispense:  90 tablet    Refill:  1  . cloNIDine (CATAPRES) tablet 0.1 mg    Sig:     Medications Discontinued During This Encounter  Medication Reason  . HYDROcodone-acetaminophen (NORCO) 10-325 MG tablet Reorder  . traMADol (ULTRAM) 50 MG tablet Reorder  . amLODipine (NORVASC) 2.5 MG tablet Reorder    Follow-up: No Follow-up on file.   [name], MD

## 2015-11-11 NOTE — Progress Notes (Signed)
Pre-visit discussion using our clinic review tool. No additional management support is needed unless otherwise documented below in the visit note.  

## 2015-11-11 NOTE — Patient Instructions (Addendum)
YOUR BLOOD PRESSURE IS CRITICALLY HIGH!     Stop taking Aleve and Motrin. USE ONLY TYLENOL FOR OTC PAIN RELIEVERS  Take 4 amlodipine TONIGHT  (10 mg total) when you get home,  And have Kim check your BP 1-2 hours later.  It it has not improved to < 180/100,  Please GO TO THE ER    I will send  NEW RX FOR 10 MG DOSE of amlodipine TO YOUR PHARMACY   Continue Losartan 100 mg daily as well,  And add furosemide 20 mg daily as needed for fluid retention .  Send me readings daily  For the next week so I can make sure they are coming down.  Goal is < 130/80  You can resume 2 vicodin daily , alternating with tramadol  For your pain   Plain x rays of left hip and lumbar spine have been ordered

## 2015-11-12 ENCOUNTER — Other Ambulatory Visit: Payer: Self-pay | Admitting: Internal Medicine

## 2015-11-12 ENCOUNTER — Encounter: Payer: Self-pay | Admitting: *Deleted

## 2015-11-12 ENCOUNTER — Encounter: Payer: Self-pay | Admitting: Internal Medicine

## 2015-11-12 DIAGNOSIS — M5440 Lumbago with sciatica, unspecified side: Secondary | ICD-10-CM

## 2015-11-12 NOTE — Assessment & Plan Note (Addendum)
Increased in severity . Plain films of hip and lumbar spine  Suggest a lower spine issue.  MRI ordered

## 2015-11-13 ENCOUNTER — Encounter: Payer: Self-pay | Admitting: Internal Medicine

## 2015-11-13 MED ORDER — TELMISARTAN 80 MG PO TABS: 80.0000 mg | ORAL_TABLET | Freq: Every day | ORAL | Status: DC

## 2015-12-04 ENCOUNTER — Ambulatory Visit
Admission: RE | Admit: 2015-12-04 | Discharge: 2015-12-04 | Disposition: A | Discharge status: Home / Self Care | Payer: 59 | Source: Ambulatory Visit | Attending: Internal Medicine | Admitting: Internal Medicine

## 2015-12-04 DIAGNOSIS — M4806 Spinal stenosis, lumbar region: Secondary | ICD-10-CM | POA: Diagnosis not present

## 2015-12-04 DIAGNOSIS — M5440 Lumbago with sciatica, unspecified side: Secondary | ICD-10-CM | POA: Diagnosis present

## 2015-12-04 DIAGNOSIS — M4316 Spondylolisthesis, lumbar region: Secondary | ICD-10-CM | POA: Insufficient documentation

## 2015-12-05 ENCOUNTER — Encounter: Payer: Self-pay | Admitting: Internal Medicine

## 2015-12-05 DIAGNOSIS — M5430 Sciatica, unspecified side: Secondary | ICD-10-CM

## 2015-12-16 DIAGNOSIS — M4317 Spondylolisthesis, lumbosacral region: Secondary | ICD-10-CM | POA: Diagnosis not present

## 2015-12-18 ENCOUNTER — Other Ambulatory Visit: Payer: Self-pay | Admitting: Internal Medicine

## 2015-12-18 ENCOUNTER — Encounter: Payer: Self-pay | Admitting: Internal Medicine

## 2015-12-18 MED ORDER — OXYCODONE-ACETAMINOPHEN 5-325 MG PO TABS: 1.0000 | ORAL_TABLET | ORAL | Status: DC | PRN

## 2016-01-05 DIAGNOSIS — M5417 Radiculopathy, lumbosacral region: Secondary | ICD-10-CM | POA: Diagnosis not present

## 2016-01-05 DIAGNOSIS — M4317 Spondylolisthesis, lumbosacral region: Secondary | ICD-10-CM | POA: Diagnosis not present

## 2016-01-12 ENCOUNTER — Other Ambulatory Visit: Payer: Self-pay | Admitting: Internal Medicine

## 2016-01-18 ENCOUNTER — Encounter: Payer: Self-pay | Admitting: Internal Medicine

## 2016-01-18 ENCOUNTER — Other Ambulatory Visit: Payer: Self-pay | Admitting: Internal Medicine

## 2016-01-20 ENCOUNTER — Other Ambulatory Visit: Payer: Self-pay | Admitting: Internal Medicine

## 2016-01-20 MED ORDER — OXYCODONE-ACETAMINOPHEN 5-325 MG PO TABS: 1.0000 | ORAL_TABLET | ORAL | Status: DC | PRN

## 2016-01-26 DIAGNOSIS — M5417 Radiculopathy, lumbosacral region: Secondary | ICD-10-CM | POA: Diagnosis not present

## 2016-01-26 DIAGNOSIS — M4317 Spondylolisthesis, lumbosacral region: Secondary | ICD-10-CM | POA: Diagnosis not present

## 2016-02-06 ENCOUNTER — Other Ambulatory Visit: Payer: Self-pay | Admitting: Internal Medicine

## 2016-02-16 ENCOUNTER — Other Ambulatory Visit: Payer: Self-pay | Admitting: Internal Medicine

## 2016-02-16 ENCOUNTER — Telehealth: Payer: Self-pay | Admitting: Internal Medicine

## 2016-02-16 ENCOUNTER — Encounter: Payer: Self-pay | Admitting: Internal Medicine

## 2016-02-16 MED ORDER — OXYCODONE-ACETAMINOPHEN 5-325 MG PO TABS: 1.0000 | ORAL_TABLET | ORAL | Status: DC | PRN

## 2016-02-16 NOTE — Telephone Encounter (Signed)
Patient last refilled this on 2/14 for #180.  Please advise refill and removal of acetaminophen from it? Thanks

## 2016-02-16 NOTE — Telephone Encounter (Signed)
My chart message sent.   It is not too much tylenol,  I woud not have prescribed it if it was.  Removing the tylenol will remove his pain control.  Refill printed

## 2016-02-16 NOTE — Telephone Encounter (Signed)
Pt called and left a vm wanting to know if his refill was granted. Pt was called and informed his refill was granted and when it's ready he will get a call. Thank you!

## 2016-02-16 NOTE — Telephone Encounter (Signed)
Pt called and left a voicemail wanting a refill on  oxyCODONE-acetaminophen (ROXICET) 5-325 MG  Pt also wants to know if the 325 can be taken out? Pt feels like he's taking too much. Please call pt when it's ready @ [phone]. Thank you!

## 2016-02-19 DIAGNOSIS — Z6836 Body mass index (BMI) 36.0-36.9, adult: Secondary | ICD-10-CM | POA: Diagnosis not present

## 2016-02-19 DIAGNOSIS — M4317 Spondylolisthesis, lumbosacral region: Secondary | ICD-10-CM | POA: Diagnosis not present

## 2016-03-08 ENCOUNTER — Other Ambulatory Visit: Payer: Self-pay | Admitting: Internal Medicine

## 2016-03-08 NOTE — Telephone Encounter (Signed)
Ok to refill tramadol last OV 12/16?

## 2016-03-09 NOTE — Telephone Encounter (Signed)
refilled 

## 2016-03-16 ENCOUNTER — Other Ambulatory Visit: Payer: Self-pay | Admitting: Internal Medicine

## 2016-03-16 ENCOUNTER — Telehealth: Payer: Self-pay | Admitting: *Deleted

## 2016-03-16 MED ORDER — OXYCODONE-ACETAMINOPHEN 5-325 MG PO TABS: 1.0000 | ORAL_TABLET | ORAL | Status: DC | PRN

## 2016-03-16 NOTE — Telephone Encounter (Signed)
Patient has requested Rx refill for Oxycodone. Please call when ready for pick up.

## 2016-03-16 NOTE — Telephone Encounter (Signed)
Patient notified script ready for pick up an placed at front desk.

## 2016-03-16 NOTE — Telephone Encounter (Signed)
There is a pended order awaiting Providers approval in the pool.

## 2016-03-16 NOTE — Telephone Encounter (Signed)
refilled 

## 2016-04-01 ENCOUNTER — Other Ambulatory Visit: Payer: Self-pay | Admitting: Neurosurgery

## 2016-04-01 DIAGNOSIS — M4317 Spondylolisthesis, lumbosacral region: Secondary | ICD-10-CM | POA: Diagnosis not present

## 2016-04-01 DIAGNOSIS — Z6835 Body mass index (BMI) 35.0-35.9, adult: Secondary | ICD-10-CM | POA: Diagnosis not present

## 2016-04-01 DIAGNOSIS — I10 Essential (primary) hypertension: Secondary | ICD-10-CM | POA: Diagnosis not present

## 2016-04-05 ENCOUNTER — Encounter: Payer: Self-pay | Admitting: Internal Medicine

## 2016-04-05 ENCOUNTER — Other Ambulatory Visit: Payer: Self-pay | Admitting: Internal Medicine

## 2016-04-05 HISTORY — PX: BACK SURGERY: SHX140

## 2016-04-05 NOTE — Telephone Encounter (Signed)
Please advise ? Would you want him to be seen by another provider?

## 2016-04-05 NOTE — Telephone Encounter (Signed)
Pt called to sch appt, however no avail appt to sch before pt surgery on 04/20/2016. Let me know where to sch. Call pt @ [phone]. Thank you!

## 2016-04-07 ENCOUNTER — Other Ambulatory Visit: Payer: Self-pay | Admitting: Internal Medicine

## 2016-04-07 NOTE — Telephone Encounter (Signed)
The 1 p.m spot is no longer available you have a 8 a.m on Thursday 04/08/16.

## 2016-04-08 ENCOUNTER — Ambulatory Visit (INDEPENDENT_AMBULATORY_CARE_PROVIDER_SITE_OTHER): Payer: 59 | Admitting: Internal Medicine

## 2016-04-08 DIAGNOSIS — M4317 Spondylolisthesis, lumbosacral region: Secondary | ICD-10-CM | POA: Diagnosis not present

## 2016-04-08 MED ORDER — OXYCODONE-ACETAMINOPHEN 5-325 MG PO TABS: 1.0000 | ORAL_TABLET | ORAL | Status: DC | PRN

## 2016-04-08 MED ORDER — OXYCODONE HCL ER 20 MG PO T12A: 20.0000 mg | EXTENDED_RELEASE_TABLET | Freq: Two times a day (BID) | ORAL | Status: DC

## 2016-04-08 NOTE — Progress Notes (Signed)
Subjective:  Patient ID: Tyrone Mcdowell, male    DOB: [birth year]  Age: 60 y.o. MRN: [id number]  CC: The encounter diagnosis was Spondylolisthesis at L5-S1 level.  HPI [name] presents for follow up on severe back pain due to spinal stenosis. he has run out of his percocet and has been using 8 to 10 daily.  He states that the medications help for about 2 hours , but then his pain becomes severe. He has been unable to sleep due to severe pain and has been sleeping sitting up. [name] He has  Surgery planned in two weeks by Deri Fuelling lumbar fusion with instrumentation . He denies constipation.  .    Outpatient Prescriptions Prior to Visit  Medication Sig Dispense Refill  . amLODipine (NORVASC) 10 MG tablet Take 1 tablet (10 mg total) by mouth daily. 90 tablet 1  . aspirin 81 MG tablet Take 81 mg by mouth daily.    [name] atorvastatin (LIPITOR) 20 MG tablet Take 1 tablet (20 mg total) by mouth daily. 90 tablet 3  . diazepam (VALIUM) 5 MG tablet TAKE ONE TABLET BY MOUTH EVERY 12 HOURS AS NEEDED FOR ANXIETY 60 tablet 2  . furosemide (LASIX) 20 MG tablet Take 1 tablet (20 mg total) by mouth daily. 30 tablet 3  . HYDROcodone-acetaminophen (NORCO) 10-325 MG tablet Take 1 tablet by mouth every 6 (six) hours as needed. Maximum two daily  60 DAY SUPPLY 120 tablet 0  . polyethylene glycol powder (GLYCOLAX/MIRALAX) powder 255 grams one bottle for colonoscopy prep 255 g 0  . RAPAFLO 8 MG CAPS capsule Take 1 capsule by mouth daily.  11  . Red Yeast Rice 600 MG CAPS Take 1 capsule (600 mg total) by mouth 2 (two) times daily after a meal. 180 capsule 1  . sertraline (ZOLOFT) 100 MG tablet TAKE ONE TABLET BY MOUTH ONCE DAILY 90 tablet 2  . TDaP (BOOSTRIX) 5-2.5-18.5 LF-MCG/0.5 injection Inject 0.5 mLs into the muscle once. 0.5 mL 0  . telmisartan (MICARDIS) 80 MG tablet TAKE 1 TABLET BY MOUTH DAILY. 30 tablet 3  . telmisartan (MICARDIS) 80 MG tablet TAKE 1 TABLET BY MOUTH DAILY. 30 tablet 4  . traMADol  (ULTRAM) 50 MG tablet TAKE ONE TABLET BY MOUTH EVERY 6 HOURS AS NEEDED 120 tablet 3  . oxyCODONE-acetaminophen (ROXICET) 5-325 MG tablet Take 1-2 tablets by mouth every 4 (four) hours as needed for severe pain. Maximum daily dose 6 tablets 180 tablet 0   No facility-administered medications prior to visit.    Review of Systems;  Patient denies headache, fevers, malaise, unintentional weight loss, skin rash, eye pain, sinus congestion and sinus pain, sore throat, dysphagia,  hemoptysis , cough, dyspnea, wheezing, chest pain, palpitations, orthopnea, edema, abdominal pain, nausea, melena, diarrhea, constipation, flank pain, dysuria, hematuria, urinary  Frequency, nocturia, numbness, tingling, seizures,  Focal weakness, Loss of consciousness,  Tremor, insomnia, depression, anxiety, and suicidal ideation.      Objective:  There were no vitals taken for this visit.  BP Readings from Last 3 Encounters:  11/11/15 230/130  04/21/15 148/100  10/15/14 160/104    Wt Readings from Last 3 Encounters:  11/11/15 256 lb 4 oz (116.234 kg)  04/21/15 252 lb 8 oz (114.533 kg)  10/15/14 243 lb 8 oz (110.451 kg)    General appearance: alert, cooperative and appears stated age Ears: normal TM's and external ear canals both ears Throat: lips, mucosa, and tongue normal; teeth and gums normal Neck: no  adenopathy, no carotid bruit, supple, symmetrical, trachea midline and thyroid not enlarged, symmetric, no tenderness/mass/nodules Back: symmetric, no curvature. ROM normal. No CVA tenderness. Lungs: clear to auscultation bilaterally Heart: regular rate and rhythm, S1, S2 normal, no murmur, click, rub or gallop Abdomen: soft, non-tender; bowel sounds normal; no masses,  no organomegaly Pulses: 2+ and symmetric Skin: Skin color, texture, turgor normal. No rashes or lesions Lymph nodes: Cervical, supraclavicular, and axillary nodes normal.  No results found for: HGBA1C  Lab Results  Component Value Date     CREATININE 0.83 11/11/2015   CREATININE 0.89 06/02/2015   CREATININE 0.92 02/14/2015    Lab Results  Component Value Date   WBC 6.8 05/03/2012   HGB 14.3 05/03/2012   HCT 43.5 05/03/2012   PLT 251 05/03/2012   GLUCOSE 96 11/11/2015   CHOL 137 06/02/2015   TRIG 81.0 06/02/2015   HDL 41.30 06/02/2015   LDLDIRECT 142.8 10/23/2012   LDLCALC 80 06/02/2015   ALT 17 11/11/2015   AST 19 11/11/2015   NA 140 11/11/2015   K 4.2 11/11/2015   CL 103 11/11/2015   CREATININE 0.83 11/11/2015   BUN 13 11/11/2015   CO2 30 11/11/2015   PSA 0.50 06/24/2014   MICROALBUR <0.7 11/11/2015    Mr Lumbar Spine Wo Contrast  12/04/2015  CLINICAL DATA:  Low back pain with bilateral leg pain and burning in the feet for 6 months. EXAM: MRI LUMBAR SPINE WITHOUT CONTRAST TECHNIQUE: Multiplanar, multisequence MR imaging of the lumbar spine was performed. No intravenous contrast was administered. COMPARISON:  Radiographs dated 09/11/2015 FINDINGS: Normal conus tip at L1-2.  Normal paraspinal soft tissues. T11-12 through L4-5:  No significant abnormalities. L5-S1: Bilateral pars defects at L5 with a 7 mm spondylolisthesis with severe bilateral foraminal stenosis. Both L5 nerves are compressed under the pedicles of L5. There is a central bulge of the uncovered disc with no neural impingement. There is no spinal stenosis. IMPRESSION: 1. Bilateral pars defects at L5 with grade 1 spondylolisthesis and severe bilateral foraminal stenosis compressing both L5 nerves. 2. No other significant abnormalities. Electronically Signed   By: Lorriane Shire M.D.   On: 12/04/2015 14:53    Assessment & Plan:   Problem List Items Addressed This Visit    Spondylolisthesis at L5-S1 level - Primary    7 mm,, With bilateral foraminal stenosis and L5 nerve root compression .  His pain control is not adequate and he is using excessive amounts of  Tylenol .  Adding oxycontin 20 mg q 12 hours,  And refilling short acting Percocet maximum 6  daily .         I am having Mr. Fujita start on oxyCODONE. I am also having him maintain his aspirin, Tdap, Red Yeast Rice, polyethylene glycol powder, RAPAFLO, atorvastatin, furosemide, HYDROcodone-acetaminophen, amLODipine, telmisartan, telmisartan, sertraline, diazepam, traMADol, and oxyCODONE-acetaminophen.  Meds ordered this encounter  Medications  . oxyCODONE-acetaminophen (ROXICET) 5-325 MG tablet    Sig: Take 1-2 tablets by mouth every 4 (four) hours as needed for severe pain. Maximum daily dose 6 tablets    Dispense:  180 tablet    Refill:  0  . oxyCODONE (OXYCONTIN) 20 mg 12 hr tablet    Sig: Take 1 tablet (20 mg total) by mouth every 12 (twelve) hours.    Dispense:  60 tablet    Refill:  0    Medications Discontinued During This Encounter  Medication Reason  . oxyCODONE-acetaminophen (ROXICET) 5-325 MG tablet Reorder    Follow-up:  No Follow-up on file.   [name], MD

## 2016-04-08 NOTE — Assessment & Plan Note (Signed)
7 mm,, With bilateral foraminal stenosis and L5 nerve root compression .  His pain control is not adequate and he is using excessive amounts of  Tylenol .  Adding oxycontin 20 mg q 12 hours,  And refilling short acting Percocet maximum 6 daily .

## 2016-04-14 ENCOUNTER — Encounter (HOSPITAL_COMMUNITY): Payer: Self-pay

## 2016-04-14 ENCOUNTER — Encounter (HOSPITAL_COMMUNITY)
Admission: RE | Admit: 2016-04-14 | Discharge: 2016-04-14 | Disposition: A | Discharge status: Home / Self Care | Payer: 59 | Source: Ambulatory Visit | Attending: Neurosurgery | Admitting: Neurosurgery

## 2016-04-14 ENCOUNTER — Other Ambulatory Visit: Payer: Self-pay

## 2016-04-14 DIAGNOSIS — M4310 Spondylolisthesis, site unspecified: Secondary | ICD-10-CM | POA: Insufficient documentation

## 2016-04-14 DIAGNOSIS — Z01812 Encounter for preprocedural laboratory examination: Secondary | ICD-10-CM | POA: Insufficient documentation

## 2016-04-14 DIAGNOSIS — Z01818 Encounter for other preprocedural examination: Secondary | ICD-10-CM | POA: Diagnosis not present

## 2016-04-14 DIAGNOSIS — Z0183 Encounter for blood typing: Secondary | ICD-10-CM | POA: Insufficient documentation

## 2016-04-14 DIAGNOSIS — R001 Bradycardia, unspecified: Secondary | ICD-10-CM | POA: Diagnosis not present

## 2016-04-14 HISTORY — DX: Pain in unspecified joint: M25.50

## 2016-04-14 HISTORY — DX: Anxiety disorder, unspecified: F41.9

## 2016-04-14 HISTORY — DX: Effusion, unspecified joint: M25.40

## 2016-04-14 HISTORY — DX: Frequency of micturition: R35.0

## 2016-04-14 HISTORY — DX: Edema, unspecified: R60.9

## 2016-04-14 HISTORY — DX: Depression, unspecified: F32.A

## 2016-04-14 HISTORY — DX: Dorsalgia, unspecified: M54.9

## 2016-04-14 HISTORY — DX: Weakness: R53.1

## 2016-04-14 HISTORY — DX: Hyperlipidemia, unspecified: E78.5

## 2016-04-14 HISTORY — DX: Other chronic pain: G89.29

## 2016-04-14 HISTORY — DX: Major depressive disorder, single episode, unspecified: F32.9

## 2016-04-14 HISTORY — DX: Localized edema: R60.0

## 2016-04-14 HISTORY — DX: Unspecified osteoarthritis, unspecified site: M19.90

## 2016-04-14 LAB — CBC WITH DIFFERENTIAL/PLATELET
BASOS ABS: 0 10*3/uL (ref 0.0–0.1)
Basophils Relative: 0 %
Eosinophils Absolute: 0.1 10*3/uL (ref 0.0–0.7)
Eosinophils Relative: 3 %
HEMATOCRIT: 42.3 % (ref 39.0–52.0)
HEMOGLOBIN: 13.8 g/dL (ref 13.0–17.0)
LYMPHS PCT: 39 %
Lymphs Abs: 1.7 10*3/uL (ref 0.7–4.0)
MCH: 30.8 pg (ref 26.0–34.0)
MCHC: 32.6 g/dL (ref 30.0–36.0)
MCV: 94.4 fL (ref 78.0–100.0)
MONO ABS: 0.3 10*3/uL (ref 0.1–1.0)
Monocytes Relative: 8 %
NEUTROS ABS: 2.1 10*3/uL (ref 1.7–7.7)
NEUTROS PCT: 50 %
Platelets: 249 10*3/uL (ref 150–400)
RBC: 4.48 MIL/uL (ref 4.22–5.81)
RDW: 12.9 % (ref 11.5–15.5)
WBC: 4.2 10*3/uL (ref 4.0–10.5)

## 2016-04-14 LAB — BASIC METABOLIC PANEL
ANION GAP: 7 (ref 5–15)
BUN: 7 mg/dL (ref 6–20)
CO2: 31 mmol/L (ref 22–32)
Calcium: 9.6 mg/dL (ref 8.9–10.3)
Chloride: 104 mmol/L (ref 101–111)
Creatinine, Ser: 0.65 mg/dL (ref 0.61–1.24)
GLUCOSE: 103 mg/dL — AB (ref 65–99)
POTASSIUM: 4.3 mmol/L (ref 3.5–5.1)
Sodium: 142 mmol/L (ref 135–145)

## 2016-04-14 LAB — SURGICAL PCR SCREEN
MRSA, PCR: NEGATIVE
STAPHYLOCOCCUS AUREUS: NEGATIVE

## 2016-04-14 LAB — TYPE AND SCREEN
ABO/RH(D): O POS
ANTIBODY SCREEN: NEGATIVE

## 2016-04-14 LAB — ABO/RH: ABO/RH(D): O POS

## 2016-04-14 NOTE — Pre-Procedure Instructions (Signed)
Tyrone Mcdowell  04/14/2016      [id number] Tyrone Mcdowell, [place] [address] Phone: [phone] Fax: Tyrone Mcdowell OF [facility] [address] [place] [id number] Phone: [phone] Fax: [phone]  [place]yrone Mcd[place], [place] - [place] [address] Phone: [phone] Fax: [phone]    Your procedure is scheduled on Tues, May 16 @ 9:30 AM  Report to [facility] at 7:30 AM  Call this number if you have problems the morning of surgery:  [phone]   Remember:  Do not eat food or drink liquids after midnight.  Take these medicines the morning of surgery with A SIP OF WATER Amlodipine(Norvasc),Valium(Diazepam),Pain Pill(if needed),Rapaflo,and Zoloft(Sertraline)              Stop taking your Aspirin. No Goody's,BC's,Aleve,Advil,Motrin,Ibuprofen,Fish Oil,or any Herbal Medications.    Do not wear jewelry.  Do not wear lotions, powders, or colognes.    Men may shave face and neck.  Do not bring valuables to the hospital.  [facility] is not responsible for any belongings or valuables.  Contacts, dentures or bridgework may not be worn into surgery.  Leave your suitcase in the car.  After surgery it may be brought to your room.  For patients admitted to the hospital, discharge time will be determined by your treatment team.  Patients discharged the day of surgery will not be allowed to drive home.    Special instructions:  Tyrone Mcdowell - Preparing for Surgery  Before surgery, you can play an important role.  Because skin is not sterile, your skin needs to be as free of germs as possible.  You can reduce the number of germs on you skin by washing with CHG (chlorahexidine gluconate) soap before surgery.  CHG is an antiseptic cleaner which kills germs and bonds with the skin to continue killing germs even  after washing.  Please DO NOT use if you have an allergy to CHG or antibacterial soaps.  If your skin becomes reddened/irritated stop using the CHG and inform your nurse when you arrive at Short Stay.  Do not shave (including legs and underarms) for at least 48 hours prior to the first CHG shower.  You may shave your face.  Please follow these instructions carefully:   1.  Shower with CHG Soap the night before surgery and the                                morning of Surgery.  2.  If you choose to wash your hair, wash your hair first as usual with your       normal shampoo.  3.  After you shampoo, rinse your hair and body thoroughly to remove the                      Shampoo.  4.  Use CHG as you would any other liquid soap.  You can apply chg directly       to the skin and wash gently with scrungie or a clean washcloth.  5.  Apply the CHG Soap to your body ONLY FROM THE NECK DOWN.        Do not use on open wounds or open sores.  Avoid contact with your eyes,  ears, mouth and genitals (private parts).  Wash genitals (private parts)       with your normal soap.  6.  Wash thoroughly, paying special attention to the area where your surgery        will be performed.  7.  Thoroughly rinse your body with warm water from the neck down.  8.  DO NOT shower/wash with your normal soap after using and rinsing off       the CHG Soap.  9.  Pat yourself dry with a clean towel.            10.  Wear clean pajamas.            11.  Place clean sheets on your bed the night of your first shower and do not        sleep with pets.  Day of Surgery  Do not apply any lotions/deoderants the morning of surgery.  Please wear clean clothes to the hospital/surgery center.    Please read over the following fact sheets that you were given. Pain Booklet, Coughing and Deep Breathing, Blood Transfusion Information, MRSA Information and Surgical Site Infection Prevention

## 2016-04-14 NOTE — Progress Notes (Addendum)
Cardiologist denies  Medical Md is Dr.Teresa Tullo  Echo denies  Stress test denies  Heart cath denies  EKG denies  CXR denies

## 2016-04-20 ENCOUNTER — Inpatient Hospital Stay (HOSPITAL_COMMUNITY): Payer: 59 | Admitting: Anesthesiology

## 2016-04-20 ENCOUNTER — Inpatient Hospital Stay (HOSPITAL_COMMUNITY): Payer: 59

## 2016-04-20 ENCOUNTER — Encounter (HOSPITAL_COMMUNITY)
Admission: RE | Disposition: A | Discharge status: Home / Self Care | Payer: Self-pay | Source: Ambulatory Visit | Attending: Neurosurgery

## 2016-04-20 ENCOUNTER — Encounter (HOSPITAL_COMMUNITY): Payer: Self-pay | Admitting: Anesthesiology

## 2016-04-20 ENCOUNTER — Inpatient Hospital Stay (HOSPITAL_COMMUNITY)
Admission: RE | Admit: 2016-04-20 | Discharge: 2016-04-21 | DRG: 460 | Disposition: A | Discharge status: Home / Self Care | Payer: 59 | Source: Ambulatory Visit | Attending: Neurosurgery | Admitting: Neurosurgery

## 2016-04-20 DIAGNOSIS — I10 Essential (primary) hypertension: Secondary | ICD-10-CM | POA: Diagnosis present

## 2016-04-20 DIAGNOSIS — M4317 Spondylolisthesis, lumbosacral region: Secondary | ICD-10-CM | POA: Diagnosis not present

## 2016-04-20 DIAGNOSIS — E785 Hyperlipidemia, unspecified: Secondary | ICD-10-CM | POA: Diagnosis present

## 2016-04-20 DIAGNOSIS — M4326 Fusion of spine, lumbar region: Secondary | ICD-10-CM | POA: Diagnosis not present

## 2016-04-20 DIAGNOSIS — Z7982 Long term (current) use of aspirin: Secondary | ICD-10-CM

## 2016-04-20 DIAGNOSIS — M4316 Spondylolisthesis, lumbar region: Secondary | ICD-10-CM | POA: Diagnosis not present

## 2016-04-20 DIAGNOSIS — F419 Anxiety disorder, unspecified: Secondary | ICD-10-CM | POA: Diagnosis present

## 2016-04-20 DIAGNOSIS — F329 Major depressive disorder, single episode, unspecified: Secondary | ICD-10-CM | POA: Diagnosis present

## 2016-04-20 DIAGNOSIS — Z720 Tobacco use: Secondary | ICD-10-CM | POA: Diagnosis not present

## 2016-04-20 DIAGNOSIS — M549 Dorsalgia, unspecified: Secondary | ICD-10-CM | POA: Diagnosis not present

## 2016-04-20 DIAGNOSIS — Z981 Arthrodesis status: Secondary | ICD-10-CM | POA: Diagnosis not present

## 2016-04-20 DIAGNOSIS — Z87891 Personal history of nicotine dependence: Secondary | ICD-10-CM

## 2016-04-20 SURGERY — POSTERIOR LUMBAR FUSION 1 LEVEL
Anesthesia: General | Site: Back

## 2016-04-20 MED ORDER — SODIUM CHLORIDE 0.9% FLUSH: 3.0000 mL | INTRAVENOUS | Status: DC | PRN

## 2016-04-20 MED ORDER — DIAZEPAM 5 MG PO TABS
ORAL_TABLET | ORAL | Status: AC
  Filled 2016-04-20: qty 1

## 2016-04-20 MED ORDER — ATORVASTATIN CALCIUM 20 MG PO TABS: 20.0000 mg | ORAL_TABLET | Freq: Every day | ORAL | Status: DC

## 2016-04-20 MED ORDER — OXYCODONE HCL 5 MG PO TABS
ORAL_TABLET | ORAL | Status: AC
Start: 2016-04-20 — End: 2016-04-21
  Filled 2016-04-20: qty 1

## 2016-04-20 MED ORDER — MIDAZOLAM HCL 5 MG/5ML IJ SOLN
INTRAMUSCULAR | Status: DC | PRN
  Administered 2016-04-20: 2 mg via INTRAVENOUS

## 2016-04-20 MED ORDER — HYDROCODONE-ACETAMINOPHEN 5-325 MG PO TABS: 1.0000 | ORAL_TABLET | ORAL | Status: DC | PRN

## 2016-04-20 MED ORDER — ONDANSETRON HCL 4 MG/2ML IJ SOLN
INTRAMUSCULAR | Status: AC
  Filled 2016-04-20: qty 2

## 2016-04-20 MED ORDER — FUROSEMIDE 20 MG PO TABS
20.0000 mg | ORAL_TABLET | Freq: Every day | ORAL | Status: DC
  Administered 2016-04-20 – 2016-04-21 (×2): 20 mg via ORAL
  Filled 2016-04-20 (×2): qty 1

## 2016-04-20 MED ORDER — ACETAMINOPHEN 325 MG PO TABS: 650.0000 mg | ORAL_TABLET | ORAL | Status: DC | PRN

## 2016-04-20 MED ORDER — MENTHOL 3 MG MT LOZG: 1.0000 | LOZENGE | OROMUCOSAL | Status: DC | PRN

## 2016-04-20 MED ORDER — TAMSULOSIN HCL 0.4 MG PO CAPS
0.4000 mg | ORAL_CAPSULE | Freq: Every day | ORAL | Status: DC
  Administered 2016-04-21: 0.4 mg via ORAL
  Filled 2016-04-20: qty 1

## 2016-04-20 MED ORDER — MIDAZOLAM HCL 2 MG/2ML IJ SOLN
INTRAMUSCULAR | Status: AC
  Filled 2016-04-20: qty 2

## 2016-04-20 MED ORDER — EPHEDRINE 5 MG/ML INJ
INTRAVENOUS | Status: AC
  Filled 2016-04-20: qty 10

## 2016-04-20 MED ORDER — ONDANSETRON HCL 4 MG/2ML IJ SOLN: 4.0000 mg | INTRAMUSCULAR | Status: DC | PRN

## 2016-04-20 MED ORDER — EPHEDRINE SULFATE 50 MG/ML IJ SOLN
INTRAMUSCULAR | Status: DC | PRN
  Administered 2016-04-20: 10 mg via INTRAVENOUS

## 2016-04-20 MED ORDER — FENTANYL CITRATE (PF) 100 MCG/2ML IJ SOLN
INTRAMUSCULAR | Status: DC | PRN
  Administered 2016-04-20: 150 ug via INTRAVENOUS
  Administered 2016-04-20 (×2): 50 ug via INTRAVENOUS

## 2016-04-20 MED ORDER — PROPOFOL 10 MG/ML IV BOLUS
INTRAVENOUS | Status: DC | PRN
  Administered 2016-04-20: 250 mg via INTRAVENOUS

## 2016-04-20 MED ORDER — THROMBIN 20000 UNITS EX SOLR
CUTANEOUS | Status: DC | PRN
  Administered 2016-04-20 (×2): 20 mL via TOPICAL

## 2016-04-20 MED ORDER — PHENOL 1.4 % MT LIQD: 1.0000 | OROMUCOSAL | Status: DC | PRN

## 2016-04-20 MED ORDER — LACTATED RINGERS IV SOLN
INTRAVENOUS | Status: DC
  Administered 2016-04-20 (×3): via INTRAVENOUS

## 2016-04-20 MED ORDER — SUCCINYLCHOLINE CHLORIDE 200 MG/10ML IV SOSY
PREFILLED_SYRINGE | INTRAVENOUS | Status: AC
  Filled 2016-04-20: qty 10

## 2016-04-20 MED ORDER — FENTANYL CITRATE (PF) 250 MCG/5ML IJ SOLN
INTRAMUSCULAR | Status: AC
  Filled 2016-04-20: qty 5

## 2016-04-20 MED ORDER — HYDROMORPHONE HCL 1 MG/ML IJ SOLN
0.5000 mg | INTRAMUSCULAR | Status: DC | PRN
  Administered 2016-04-20 (×2): 1 mg via INTRAVENOUS
  Filled 2016-04-20 (×2): qty 1

## 2016-04-20 MED ORDER — ASPIRIN EC 81 MG PO TBEC
81.0000 mg | DELAYED_RELEASE_TABLET | Freq: Every day | ORAL | Status: DC
  Administered 2016-04-21: 81 mg via ORAL
  Filled 2016-04-20: qty 1

## 2016-04-20 MED ORDER — BUPIVACAINE HCL (PF) 0.25 % IJ SOLN
INTRAMUSCULAR | Status: DC | PRN
  Administered 2016-04-20: 20 mL

## 2016-04-20 MED ORDER — OXYCODONE HCL ER 10 MG PO T12A
20.0000 mg | EXTENDED_RELEASE_TABLET | Freq: Two times a day (BID) | ORAL | Status: DC
  Administered 2016-04-20 – 2016-04-21 (×2): 20 mg via ORAL
  Filled 2016-04-20 (×2): qty 2

## 2016-04-20 MED ORDER — ONDANSETRON HCL 4 MG/2ML IJ SOLN
INTRAMUSCULAR | Status: DC | PRN
Start: 2016-04-20 — End: 2016-04-20
  Administered 2016-04-20: 4 mg via INTRAVENOUS

## 2016-04-20 MED ORDER — HYDROMORPHONE HCL 1 MG/ML IJ SOLN
0.2500 mg | INTRAMUSCULAR | Status: DC | PRN
  Administered 2016-04-20 (×4): 0.5 mg via INTRAVENOUS

## 2016-04-20 MED ORDER — SODIUM CHLORIDE 0.9% FLUSH
3.0000 mL | Freq: Two times a day (BID) | INTRAVENOUS | Status: DC
  Administered 2016-04-20 (×2): 3 mL via INTRAVENOUS

## 2016-04-20 MED ORDER — OXYCODONE HCL 5 MG/5ML PO SOLN: 5.0000 mg | Freq: Once | ORAL | Status: AC | PRN

## 2016-04-20 MED ORDER — ARTIFICIAL TEARS OP OINT
TOPICAL_OINTMENT | OPHTHALMIC | Status: AC
  Filled 2016-04-20: qty 3.5

## 2016-04-20 MED ORDER — HYDROMORPHONE HCL 1 MG/ML IJ SOLN
INTRAMUSCULAR | Status: AC
  Filled 2016-04-20: qty 1

## 2016-04-20 MED ORDER — ROCURONIUM BROMIDE 50 MG/5ML IV SOLN
INTRAVENOUS | Status: AC
  Filled 2016-04-20: qty 2

## 2016-04-20 MED ORDER — SODIUM CHLORIDE 0.9 % IR SOLN
Status: DC | PRN
  Administered 2016-04-20: 500 mL

## 2016-04-20 MED ORDER — AMLODIPINE BESYLATE 10 MG PO TABS
10.0000 mg | ORAL_TABLET | Freq: Every day | ORAL | Status: DC
  Administered 2016-04-21: 10 mg via ORAL
  Filled 2016-04-20 (×2): qty 1

## 2016-04-20 MED ORDER — SUCCINYLCHOLINE 20MG/ML (10ML) SYRINGE FOR MEDFUSION PUMP - OPTIME
INTRAMUSCULAR | Status: DC | PRN
  Administered 2016-04-20: 140 mg via INTRAVENOUS

## 2016-04-20 MED ORDER — DEXAMETHASONE SODIUM PHOSPHATE 10 MG/ML IJ SOLN
10.0000 mg | INTRAMUSCULAR | Status: AC
  Administered 2016-04-20: 10 mg via INTRAVENOUS
  Filled 2016-04-20: qty 1

## 2016-04-20 MED ORDER — OXYCODONE HCL 5 MG PO TABS
5.0000 mg | ORAL_TABLET | Freq: Once | ORAL | Status: AC | PRN
  Administered 2016-04-20: 5 mg via ORAL

## 2016-04-20 MED ORDER — GLYCOPYRROLATE 0.2 MG/ML IV SOSY
PREFILLED_SYRINGE | INTRAVENOUS | Status: AC
  Filled 2016-04-20: qty 3

## 2016-04-20 MED ORDER — GLYCOPYRROLATE 0.2 MG/ML IJ SOLN
INTRAMUSCULAR | Status: DC | PRN
  Administered 2016-04-20 (×2): 0.2 mg via INTRAVENOUS

## 2016-04-20 MED ORDER — ACETAMINOPHEN 650 MG RE SUPP
650.0000 mg | RECTAL | Status: DC | PRN
Start: 2016-04-20 — End: 2016-04-21

## 2016-04-20 MED ORDER — CEFAZOLIN SODIUM-DEXTROSE 2-4 GM/100ML-% IV SOLN
2.0000 g | INTRAVENOUS | Status: AC
Start: 2016-04-20 — End: 2016-04-20
  Administered 2016-04-20: 2 g via INTRAVENOUS
  Filled 2016-04-20: qty 100

## 2016-04-20 MED ORDER — DIAZEPAM 5 MG PO TABS
5.0000 mg | ORAL_TABLET | Freq: Four times a day (QID) | ORAL | Status: DC | PRN
  Administered 2016-04-20 – 2016-04-21 (×3): 5 mg via ORAL
  Filled 2016-04-20 (×2): qty 1

## 2016-04-20 MED ORDER — CEFAZOLIN SODIUM 1-5 GM-% IV SOLN
1.0000 g | Freq: Three times a day (TID) | INTRAVENOUS | Status: AC
  Administered 2016-04-20 – 2016-04-21 (×2): 1 g via INTRAVENOUS
  Filled 2016-04-20 (×2): qty 50

## 2016-04-20 MED ORDER — VANCOMYCIN HCL 1000 MG IV SOLR
INTRAVENOUS | Status: AC
  Filled 2016-04-20: qty 1000

## 2016-04-20 MED ORDER — LIDOCAINE HCL (CARDIAC) 20 MG/ML IV SOLN
INTRAVENOUS | Status: DC | PRN
  Administered 2016-04-20: 60 mg via INTRAVENOUS

## 2016-04-20 MED ORDER — ROCURONIUM BROMIDE 100 MG/10ML IV SOLN
INTRAVENOUS | Status: DC | PRN
  Administered 2016-04-20: 50 mg via INTRAVENOUS
  Administered 2016-04-20: 20 mg via INTRAVENOUS

## 2016-04-20 MED ORDER — SERTRALINE HCL 50 MG PO TABS
100.0000 mg | ORAL_TABLET | Freq: Every day | ORAL | Status: DC
  Administered 2016-04-21: 100 mg via ORAL
  Filled 2016-04-20: qty 2

## 2016-04-20 MED ORDER — IRBESARTAN 300 MG PO TABS
300.0000 mg | ORAL_TABLET | Freq: Every day | ORAL | Status: DC
  Administered 2016-04-21: 300 mg via ORAL
  Filled 2016-04-20: qty 1

## 2016-04-20 MED ORDER — ARTIFICIAL TEARS OP OINT
TOPICAL_OINTMENT | OPHTHALMIC | Status: DC | PRN
  Administered 2016-04-20: 1 via OPHTHALMIC

## 2016-04-20 MED ORDER — OXYCODONE-ACETAMINOPHEN 5-325 MG PO TABS
1.0000 | ORAL_TABLET | ORAL | Status: DC | PRN
  Administered 2016-04-20 – 2016-04-21 (×5): 2 via ORAL
  Filled 2016-04-20 (×5): qty 2

## 2016-04-20 MED ORDER — ONDANSETRON HCL 4 MG/2ML IJ SOLN: 4.0000 mg | Freq: Four times a day (QID) | INTRAMUSCULAR | Status: DC | PRN

## 2016-04-20 MED ORDER — VANCOMYCIN HCL 1000 MG IV SOLR
INTRAVENOUS | Status: DC | PRN
  Administered 2016-04-20: 1000 mg

## 2016-04-20 MED ORDER — 0.9 % SODIUM CHLORIDE (POUR BTL) OPTIME
TOPICAL | Status: DC | PRN
  Administered 2016-04-20: 1000 mL

## 2016-04-20 MED ORDER — LIDOCAINE 2% (20 MG/ML) 5 ML SYRINGE
INTRAMUSCULAR | Status: AC
  Filled 2016-04-20: qty 5

## 2016-04-20 MED ORDER — ALBUMIN HUMAN 5 % IV SOLN
INTRAVENOUS | Status: DC | PRN
  Administered 2016-04-20 (×2): via INTRAVENOUS

## 2016-04-20 MED ORDER — PROPOFOL 10 MG/ML IV BOLUS
INTRAVENOUS | Status: AC
  Filled 2016-04-20: qty 40

## 2016-04-20 SURGICAL SUPPLY — 56 items
BAG DECANTER FOR FLEXI CONT (MISCELLANEOUS) ×2 IMPLANT
BENZOIN TINCTURE PRP APPL 2/3 (GAUZE/BANDAGES/DRESSINGS) ×2 IMPLANT
BLADE CLIPPER SURG (BLADE) IMPLANT
BRUSH SCRUB EZ PLAIN DRY (MISCELLANEOUS) ×2 IMPLANT
BUR CUTTER 7.0 ROUND (BURR) IMPLANT
BUR MATCHSTICK NEURO 3.0 LAGG (BURR) ×4 IMPLANT
CANISTER SUCT 3000ML PPV (MISCELLANEOUS) ×2 IMPLANT
CAP LCK SPNE (Orthopedic Implant) ×4 IMPLANT
CAP LOCK SPINE RADIUS (Orthopedic Implant) ×4 IMPLANT
CAP LOCKING (Orthopedic Implant) ×4 IMPLANT
CONT SPEC 4OZ CLIKSEAL STRL BL (MISCELLANEOUS) ×2 IMPLANT
COVER BACK TABLE 60X90IN (DRAPES) ×2 IMPLANT
DECANTER SPIKE VIAL GLASS SM (MISCELLANEOUS) ×2 IMPLANT
DEVICE INTERBODY ELEVATE 23X7 (Cage) ×4 IMPLANT
DRAPE C-ARM 42X72 X-RAY (DRAPES) ×4 IMPLANT
DRAPE LAPAROTOMY 100X72X124 (DRAPES) ×2 IMPLANT
DRAPE POUCH INSTRU U-SHP 10X18 (DRAPES) ×2 IMPLANT
DRAPE PROXIMA HALF (DRAPES) IMPLANT
DRAPE SURG 17X23 STRL (DRAPES) ×8 IMPLANT
DRSG OPSITE POSTOP 4X6 (GAUZE/BANDAGES/DRESSINGS) ×2 IMPLANT
DURAPREP 26ML APPLICATOR (WOUND CARE) ×2 IMPLANT
ELECT REM PT RETURN 9FT ADLT (ELECTROSURGICAL) ×2
ELECTRODE REM PT RTRN 9FT ADLT (ELECTROSURGICAL) ×1 IMPLANT
EVACUATOR 1/8 PVC DRAIN (DRAIN) ×2 IMPLANT
GAUZE SPONGE 4X4 12PLY STRL (GAUZE/BANDAGES/DRESSINGS) ×2 IMPLANT
GAUZE SPONGE 4X4 16PLY XRAY LF (GAUZE/BANDAGES/DRESSINGS) IMPLANT
GLOVE ECLIPSE 9.0 STRL (GLOVE) ×4 IMPLANT
GLOVE EXAM NITRILE LRG STRL (GLOVE) IMPLANT
GLOVE EXAM NITRILE MD LF STRL (GLOVE) IMPLANT
GLOVE EXAM NITRILE XL STR (GLOVE) IMPLANT
GLOVE EXAM NITRILE XS STR PU (GLOVE) IMPLANT
GOWN STRL REUS W/ TWL LRG LVL3 (GOWN DISPOSABLE) IMPLANT
GOWN STRL REUS W/ TWL XL LVL3 (GOWN DISPOSABLE) ×2 IMPLANT
GOWN STRL REUS W/TWL 2XL LVL3 (GOWN DISPOSABLE) IMPLANT
GOWN STRL REUS W/TWL LRG LVL3 (GOWN DISPOSABLE)
GOWN STRL REUS W/TWL XL LVL3 (GOWN DISPOSABLE) ×2
KIT BASIN OR (CUSTOM PROCEDURE TRAY) ×2 IMPLANT
KIT ROOM TURNOVER OR (KITS) ×2 IMPLANT
LIQUID BAND (GAUZE/BANDAGES/DRESSINGS) ×2 IMPLANT
NEEDLE HYPO 22GX1.5 SAFETY (NEEDLE) ×2 IMPLANT
NS IRRIG 1000ML POUR BTL (IV SOLUTION) ×2 IMPLANT
PACK LAMINECTOMY NEURO (CUSTOM PROCEDURE TRAY) ×2 IMPLANT
ROD RADIUS 35MM (Rod) ×4 IMPLANT
SCREW 5.75X40M (Screw) ×4 IMPLANT
SCREW 6.75X40MM (Screw) ×4 IMPLANT
SPONGE SURGIFOAM ABS GEL 100 (HEMOSTASIS) ×4 IMPLANT
STRIP CLOSURE SKIN 1/2X4 (GAUZE/BANDAGES/DRESSINGS) ×4 IMPLANT
SUT VIC AB 0 CT1 18XCR BRD8 (SUTURE) ×2 IMPLANT
SUT VIC AB 0 CT1 8-18 (SUTURE) ×2
SUT VIC AB 2-0 CT1 18 (SUTURE) ×2 IMPLANT
SUT VIC AB 3-0 SH 8-18 (SUTURE) ×4 IMPLANT
TAPE STRIPS DRAPE STRL (GAUZE/BANDAGES/DRESSINGS) ×2 IMPLANT
TOWEL OR 17X24 6PK STRL BLUE (TOWEL DISPOSABLE) ×2 IMPLANT
TOWEL OR 17X26 10 PK STRL BLUE (TOWEL DISPOSABLE) ×2 IMPLANT
TRAY FOLEY W/METER SILVER 14FR (SET/KITS/TRAYS/PACK) ×2 IMPLANT
WATER STERILE IRR 1000ML POUR (IV SOLUTION) ×2 IMPLANT

## 2016-04-20 NOTE — H&P (Signed)
Tyrone Mcdowell is an 60 y.o. male.   Chief Complaint: Back pain HPI: 60 year old male with back and bilateral lower extremity symptoms failing conservative management. Workup demonstrates evidence of a grade 1 lytic spondylolisthesis with marked foraminal stenosis. Patient has failed conservative management and presents now for L5-S1 decompression and fusion in hopes of improving his symptoms.  Past Medical History  Diagnosis Date  . Hypertension     takes Micardis and AMlodipine daily  . Hyperlipidemia     takes Atorvastatin daily  . Depression     takes Zoloft daily  . Anxiety     takes Valium as needed  . Weakness     numbness and tingling  . Peripheral edema     takes Lasix daily  . Arthritis   . Joint pain   . Joint swelling   . Chronic back pain     spondylolisthesis  . Urinary frequency     takes Rapaflo daily    Past Surgical History  Procedure Laterality Date  . Tibia fracture surgery Bilateral 1989    multiple surgeries   . Tibia hardware removal  1989  . Colonoscopy      Family History  Problem Relation Age of Onset  . Cancer Mother     lung Ca, tobacco abuse  . Stroke Father   . Hypertension Father   . Heart disease Father     pacemaker, heart failure  . Kidney disease Father   . Cancer Sister 20    colon CA  . Cancer Other     colon CA at age 44  . Ulcerative colitis Brother   . Ulcerative colitis Father    Social History:  reports that he has quit smoking. His smoking use included Cigarettes. He smoked 0.00 packs per day. His smokeless tobacco use includes Chew. He reports that he does not drink alcohol or use illicit drugs.  Allergies: No Known Allergies  Medications Prior to Admission  Medication Sig Dispense Refill  . amLODipine (NORVASC) 10 MG tablet Take 1 tablet (10 mg total) by mouth daily. 90 tablet 1  . aspirin 81 MG tablet Take 81 mg by mouth daily.    . diazepam (VALIUM) 5 MG tablet TAKE ONE TABLET BY MOUTH EVERY 12 HOURS AS  NEEDED FOR ANXIETY 60 tablet 2  . furosemide (LASIX) 20 MG tablet TAKE 1 TABLET (20 MG TOTAL) BY MOUTH DAILY. 90 tablet 2  . oxyCODONE (OXYCONTIN) 20 mg 12 hr tablet Take 1 tablet (20 mg total) by mouth every 12 (twelve) hours. 60 tablet 0  . oxyCODONE-acetaminophen (ROXICET) 5-325 MG tablet Take 1-2 tablets by mouth every 4 (four) hours as needed for severe pain. Maximum daily dose 6 tablets 180 tablet 0  . RAPAFLO 8 MG CAPS capsule Take 1 capsule by mouth daily.  11  . sertraline (ZOLOFT) 100 MG tablet TAKE ONE TABLET BY MOUTH ONCE DAILY 90 tablet 2  . telmisartan (MICARDIS) 80 MG tablet TAKE 1 TABLET BY MOUTH DAILY. 30 tablet 3  . traMADol (ULTRAM) 50 MG tablet TAKE ONE TABLET BY MOUTH EVERY 6 HOURS AS NEEDED 120 tablet 3  . atorvastatin (LIPITOR) 20 MG tablet Take 1 tablet (20 mg total) by mouth daily. 90 tablet 3    No results found for this or any previous visit (from the past 48 hour(s)). No results found.    Blood pressure 127/78, pulse 50, temperature 98.2 F (36.8 C), temperature source Oral, resp. rate 20, height 5\' 9"  (1.753 m),  weight 112.662 kg (248 lb 6 oz), SpO2 97 %.  Patient is awake and alert. He is oriented and appropriate. His cranial nerve function is intact. His speech is fluent. His judgment and insight are intact. Motor examination the extremities normal. Sensory examination nonfocal. Deep tender reflexes normal active except his Achilles reflexes are absent bilaterally. No evidence of long track signs. Gait is somewhat slow and shuffling. Posture is mildly flexed. Examination of the head ears eyes nose and throat is unremarkable. Chest and abdomen are benign. Extremities are free from injury deformity. Assessment/Plan L5-S1 grade 1 lytic spondylolisthesis with back pain and radiculopathy. Plan bilateral L5-S1 decompressive laminotomies and foraminotomies (Gill procedure) followed by posterior lumbar interbody fusion utilizing interbody cages and local autografting  coupled with posterior lateral arthrodesis utilizing nonsegmental pedicle screw fixation and local autografting. Risks and benefits of been explained. Patient wishes to proceed.  Isabella Roemmich A 04/20/2016, 9:32 AM

## 2016-04-20 NOTE — Op Note (Signed)
Date of procedure: 04/20/2016  Date of dictation: Same  Service: Neurosurgery  Preoperative diagnosis: Grade 1 L5-S1 lytic spondylolisthesis  Postoperative diagnosis: Same  Procedure Name: L5-S1 decompressive laminectomy with bilateral L5, S1 decompressive foraminotomies (L procedure), more than would be required for simple interbody fusion alone.  L5-S1 posterior lumbar interbody fusion utilizing interbody expandable cages and local autograft.  L5-S1 posterior lateral arthrodesis utilizing nonsegmental pedicle screw fixation and local autograft.  Surgeon:Estuardo Frisbee A.Ein Rijo, M.D.  Asst. Surgeon: Ellene Route  Anesthesia: General  Indication: 60 year old male with chronic intractable back and bilateral lower chamois pain failing conservative management. Workup demonstrates evidence of a grade 1 L5-S1 lytic spondylolisthesis with severe neural foraminal stenosis. Patient has failed conservative management presents now for decompression and fusion in hopes of improving his symptoms.  Operative note: After induction of anesthesia, patient position prone onto Wilson frame and her purply padded. Lumbar region prepped and draped sterilely. Incision made overlying L5-S1. Dissection performed. Retractor placed. Fluoroscopy used. Levels confirmed. Gill procedure performed by removing the entire lamina of L5 the rudimentary inferior facets of L5 the complete inferior facet of L5 otherwise and the majority the superior facet of S1. L5 and S1 nerve roots identified and wide decompressive foraminotomies performed on the course. Disc space identified. The overhanging osteophyte from S1 was resected. Disc spaces and entered and discectomies were performed bilaterally. Disc space distracted up to 10 mm. With atenolol meter distractor left and patient's right side disc space was prepared for interbody fusion. A 7 mm expandable Medtronic cage was then packed with local autograft. This cage was impacted in place and  expanded to its full extent. Distractor removed patient's contralateral side. Once again the disc space was prepared for interbody fusion. Morselize autograft was packed in the interspace. A second cage was impacted into place and expanded to its full extent. Pedicles at L5 and S1 were identified using surface landmarks and intraoperative fluoroscopy. Superficial bone upon pedicle was removed using high-speed drill. Each pedicle was probed using pedicle awl each pedicle awl track was probed and found to be solidly within the bone. Each pedicle awl track was then tapped with a screw tap. Each screw tap hole was probed and found to be solidly within the bone. Radius brand screws from [employer] were placed bilaterally at L5 and S1. Transverse processes and sacral alar were decorticated. Morselize autograft packed posterior laterally. Final images revealed good position the bone Louretta Shorten the hardware at proper upper level with improved alignment of the spine. Short segment titanium rod was placed over the screws at L5 and S1. Locking caps placed over the screw. Locking caps were then engaged with the construct under compression. Wound is irrigated one final time. Gelfoam was placed topically over the laminectomy defect. Vancomycin powder placed the deep wound space. Wounds and close in layers with Vicryl sutures. Steri-Strips and sterile dressing were applied. No apparent complications. The patient tolerated the procedure well and he returns to the recovery room postop.

## 2016-04-20 NOTE — Anesthesia Preprocedure Evaluation (Signed)
Anesthesia Evaluation  Patient identified by MRN, date of birth, ID band Patient awake    Reviewed: Allergy & Precautions, NPO status , Patient's Chart, lab work & pertinent test results  Airway Mallampati: II   Neck ROM: full    Dental   Pulmonary former smoker,    breath sounds clear to auscultation       Cardiovascular hypertension, + Peripheral Vascular Disease   Rhythm:regular Rate:Normal     Neuro/Psych PSYCHIATRIC DISORDERS Anxiety Depression    GI/Hepatic   Endo/Other  obese  Renal/GU      Musculoskeletal  (+) Arthritis ,   Abdominal   Peds  Hematology   Anesthesia Other Findings   Reproductive/Obstetrics                             Anesthesia Physical Anesthesia Plan  ASA: II  Anesthesia Plan: General   Post-op Pain Management:    Induction: Intravenous  Airway Management Planned: Oral ETT  Additional Equipment:   Intra-op Plan:   Post-operative Plan: Extubation in OR  Informed Consent: I have reviewed the patients History and Physical, chart, labs and discussed the procedure including the risks, benefits and alternatives for the proposed anesthesia with the patient or authorized representative who has indicated his/her understanding and acceptance.     Plan Discussed with: CRNA, Anesthesiologist and Surgeon  Anesthesia Plan Comments:         Anesthesia Quick Evaluation

## 2016-04-20 NOTE — Anesthesia Procedure Notes (Signed)
Procedure Name: Intubation Date/Time: 04/20/2016 9:48 AM Performed by: Scheryl Darter Pre-anesthesia Checklist: Patient identified, Emergency Drugs available, Suction available and Patient being monitored Patient Re-evaluated:Patient Re-evaluated prior to inductionOxygen Delivery Method: Circle System Utilized Preoxygenation: Pre-oxygenation with 100% oxygen Intubation Type: IV induction Ventilation: Mask ventilation without difficulty Laryngoscope Size: Miller and 3 Grade View: Grade I Tube type: Oral Tube size: 8.0 mm Number of attempts: 1 Airway Equipment and Method: Stylet and Oral airway Placement Confirmation: ETT inserted through vocal cords under direct vision,  positive ETCO2 and breath sounds checked- equal and bilateral Secured at: 23 cm Tube secured with: Tape Dental Injury: Teeth and Oropharynx as per pre-operative assessment

## 2016-04-20 NOTE — Brief Op Note (Signed)
04/20/2016  12:31 PM  PATIENT:  Tyrone Mcdowell  60 y.o. male  PRE-OPERATIVE DIAGNOSIS:  Spondylolisthesis  POST-OPERATIVE DIAGNOSIS:  Spondylolisthesis  PROCEDURE:  Procedure(s) with comments: PLIF - L5-S1 (N/A) - PLIF - L5-S1  SURGEON:  Surgeon(s) and Role:    * Earnie Larsson, MD - Primary    * Kristeen Miss, MD - Assisting  PHYSICIAN ASSISTANT:   ASSISTANTS:    ANESTHESIA:   general  EBL:  Total I/O In: 2000 [I.V.:1400; Blood:100; IV Piggyback:500] Out: 470 [Urine:120; Blood:350]  BLOOD ADMINISTERED:none  DRAINS: none   LOCAL MEDICATIONS USED:  MARCAINE     SPECIMEN:  No Specimen  DISPOSITION OF SPECIMEN:  N/A  COUNTS:  YES  TOURNIQUET:  * No tourniquets in log *  DICTATION: .Dragon Dictation  PLAN OF CARE: Admit to inpatient   PATIENT DISPOSITION:  PACU - hemodynamically stable.   Delay start of Pharmacological VTE agent (>24hrs) due to surgical blood loss or risk of bleeding: yes

## 2016-04-20 NOTE — Progress Notes (Signed)
[facility] Progress Note Patient Details:  [name] 1956-09-13 [id number] Per staff at [facility], pt. has Aspen lumbar brace. Patient ID: Tyrone Mcdowell, male   DOB: 1956-02-26, 60 y.o.   MRN: [id number]   Darrol Poke 04/20/2016, 4:18 PM

## 2016-04-20 NOTE — Transfer of Care (Signed)
Immediate Anesthesia Transfer of Care Note  Patient: Tyrone Mcdowell  Procedure(s) Performed: Procedure(s) with comments: PLIF - L5-S1 (N/A) - PLIF - L5-S1  Patient Location: PACU  Anesthesia Type:General  Level of Consciousness: awake, alert , oriented and sedated  Airway & Oxygen Therapy: Patient Spontanous Breathing and Patient connected to nasal cannula oxygen  Post-op Assessment: Report given to RN, Post -op Vital signs reviewed and stable and Patient moving all extremities  Post vital signs: Reviewed and stable  Last Vitals:  Filed Vitals:   04/20/16 0812 04/20/16 0819  BP: 140/126 127/78  Pulse: 50   Temp: 36.8 C   Resp: 20     Last Pain:  Filed Vitals:   04/20/16 0819  PainSc: 7          Complications: No apparent anesthesia complications

## 2016-04-21 MED ORDER — DIAZEPAM 5 MG PO TABS: 5.0000 mg | ORAL_TABLET | Freq: Four times a day (QID) | ORAL | Status: DC | PRN

## 2016-04-21 MED ORDER — OXYCODONE-ACETAMINOPHEN 5-325 MG PO TABS: 1.0000 | ORAL_TABLET | ORAL | Status: DC | PRN

## 2016-04-21 NOTE — Evaluation (Signed)
Physical Therapy Evaluation and Discharge Patient Details Name: Tyrone Mcdowell MRN: [id number] DOB: 1956-11-04 Today's Date: 04/21/2016   History of Present Illness  Pt is a 60 y/o male who presents s/p L5-S1 PLIF on 04/20/16.  Clinical Impression  Patient evaluated by Physical Therapy with no further acute PT needs identified. All education has been completed and the patient has no further questions. At the time of PT eval pt was able to perform transfers and ambulation with modified independence. Pt moving slow and guarded but overall steady. Will have wife at home through the weekend.  See below for any follow-up Physical Therapy or equipment needs. PT is signing off. Thank you for this referral.     Follow Up Recommendations Outpatient PT;Supervision for mobility/OOB    Equipment Recommendations  None recommended by PT    Recommendations for Other Services       Precautions / Restrictions Precautions Precautions: Fall;Back Precaution Booklet Issued: Yes (comment) Precaution Comments: Reviewed handout and pt was cued for precautions during functional mobility.  Required Braces or Orthoses: Spinal Brace Spinal Brace: Lumbar corset;Applied in sitting position Restrictions Weight Bearing Restrictions: No      Mobility  Bed Mobility Overal bed mobility: Modified Independent Bed Mobility: Rolling;Sidelying to Sit           General bed mobility comments: Pt was able to transition to EOB with HOB flat and no use of rails to simulate home environment. No assist required.   Transfers Overall transfer level: Modified independent Equipment used: None             General transfer comment: Slow and guarded but no unsteadiness or LOB noted. No assist required.   Ambulation/Gait Ambulation/Gait assistance: Modified independent (Device/Increase time) Ambulation Distance (Feet): 400 Feet Assistive device: None Gait Pattern/deviations: Step-through pattern;Decreased stride  length Gait velocity: Decreased Gait velocity interpretation: Below normal speed for age/gender General Gait Details: VC's for improved posture. Pt reaching for walls/rails for comfort but was able to ambulate in middle of hall without UE support and without difficulty.   Stairs Stairs: Yes Stairs assistance: Supervision Stair Management: One rail Right;Step to pattern;Forwards Number of Stairs: 5 General stair comments: VC's for sequencing and safety.   Wheelchair Mobility    Modified Rankin (Stroke Patients Only)       Balance Overall balance assessment: Needs assistance Sitting-balance support: Feet supported;No upper extremity supported Sitting balance-Leahy Scale: Good     Standing balance support: No upper extremity supported;During functional activity Standing balance-Leahy Scale: Fair                               Pertinent Vitals/Pain Pain Assessment: Faces Faces Pain Scale: Hurts a little bit Pain Location: Incision site Pain Descriptors / Indicators: Operative site guarding;Sore Pain Intervention(s): Monitored during session;Limited activity within patient's tolerance;Repositioned    Home Living Family/patient expects to be discharged to:: Private residence Living Arrangements: Spouse/significant other Available Help at Discharge: Family;Available 24 hours/day Type of Home: House Home Access: Stairs to enter Entrance Stairs-Rails: Right Entrance Stairs-Number of Steps: 4 Home Layout: One level (1 step down into kitchen) Home Equipment: Walker - 2 wheels;Cane - single point;Shower seat - built in      Prior Function Level of Independence: Independent               Hand Dominance   Dominant Hand: Right    Extremity/Trunk Assessment   Upper Extremity Assessment: Defer to  OT evaluation           Lower Extremity Assessment: RLE deficits/detail RLE Deficits / Details: Decreased strength consistent with pre-op  diagnosis/symptoms    Cervical / Trunk Assessment: Other exceptions  Communication   Communication: No difficulties  Cognition Arousal/Alertness: Awake/alert Behavior During Therapy: WFL for tasks assessed/performed Overall Cognitive Status: Within Functional Limits for tasks assessed                      General Comments      Exercises        Assessment/Plan    PT Assessment Patent does not need any further PT services  PT Diagnosis Difficulty walking;Acute pain   PT Problem List    PT Treatment Interventions     PT Goals (Current goals can be found in the Care Plan section) Acute Rehab PT Goals Patient Stated Goal: Home when MD clears him PT Goal Formulation: All assessment and education complete, DC therapy    Frequency     Barriers to discharge        Co-evaluation               End of Session Equipment Utilized During Treatment: Back brace Activity Tolerance: Patient tolerated treatment well Patient left: in chair;with call bell/phone within reach Nurse Communication: Mobility status         Time: [id number] PT Time Calculation (min) (ACUTE ONLY): 21 min   Charges:   PT Evaluation $PT Eval Moderate Complexity: 1 Procedure     PT G CodesRolinda Roan 2016/05/08, 8:02 AM   Rolinda Roan, PT, DPT Acute Rehabilitation Services Pager: [phone]

## 2016-04-21 NOTE — Anesthesia Postprocedure Evaluation (Signed)
Anesthesia Post Note  Patient: Tyrone Mcdowell  Procedure(s) Performed: Procedure(s) (LRB): PLIF - L5-S1 (N/A)  Patient location during evaluation: PACU Anesthesia Type: General Level of consciousness: awake and alert and patient cooperative Pain management: pain level controlled Vital Signs Assessment: post-procedure vital signs reviewed and stable Respiratory status: spontaneous breathing and respiratory function stable Cardiovascular status: stable Anesthetic complications: no    Last Vitals:  Filed Vitals:   04/21/16 0600 04/21/16 0837  BP: 148/73 127/42  Pulse: 76 74  Temp: 38 C 36.8 C  Resp: 20 18    Last Pain:  Filed Vitals:   04/21/16 0837  PainSc: [place]

## 2016-04-21 NOTE — Progress Notes (Signed)
Occupational Therapy Evaluation/Discharge Patient Details Name: Tyrone Mcdowell MRN: [id number] DOB: 15-Sep-1956 Today's Date: 04/21/2016    History of Present Illness Pt is a 60 y/o male who presents s/p L5-S1 PLIF on 04/20/16.   Clinical Impression   Patient has been evaluated by Occupational Therapy with no acute OT needs identified. Pt educated on use of AE for LB ADLs which he found useful and therapist provided information where pt could purchase items if desired. Reviewed activity restrictions and progression, energy conservation, pain management, and fall prevention strategies. Pt will have 24/7 assistance from his wife until Monday. No OT follow up or DME recommendations at this time. OT signing off. Thank you for this referral.    Follow Up Recommendations  No OT follow up;Supervision - Intermittent    Equipment Recommendations  None recommended by OT    Recommendations for Other Services       Precautions / Restrictions Precautions Precautions: Fall;Back Precaution Booklet Issued: No Precaution Comments: Pt able to recall 2/3 back precautions at beginning of session. Reviewed precautions and handout Required Braces or Orthoses: Spinal Brace Spinal Brace: Lumbar corset;Applied in sitting position Restrictions Weight Bearing Restrictions: No      Mobility Bed Mobility Overal bed mobility: Modified Independent Bed Mobility: Rolling;Sidelying to Sit           General bed mobility comments: Pt up in chair on OT arrival  Transfers Overall transfer level: Modified independent Equipment used: None             General transfer comment: Slow and guarded but no unsteadiness or LOB noted. No assist required.     Balance Overall balance assessment: Needs assistance Sitting-balance support: No upper extremity supported;Feet supported Sitting balance-Leahy Scale: Good     Standing balance support: No upper extremity supported;During functional  activity Standing balance-Leahy Scale: Good                              ADL Overall ADL's : Needs assistance/impaired     Grooming: Wash/dry hands;Supervision/safety;Standing Grooming Details (indicate cue type and reason): educated on 2 cup method for oral care Upper Body Bathing: Supervision/ safety;Sitting   Lower Body Bathing: Supervison/ safety;With adaptive equipment;Sit to/from stand Lower Body Bathing Details (indicate cue type and reason): educated on use of long-handled sponge Upper Body Dressing : Supervision/safety;Sitting   Lower Body Dressing: Min guard;With adaptive equipment;Sit to/from stand;Cueing for compensatory techniques Lower Body Dressing Details (indicate cue type and reason): Cues to dress RLE first and undress it last, educated on use of reacher and sock aid Toilet Transfer: Min guard;Cueing for safety;Ambulation;BSC Toilet Transfer Details (indicate cue type and reason): Cues to feel BSC on back of legs before reaching back to sit to avoid twisting Toileting- Clothing Manipulation and Hygiene: Min guard;Cueing for compensatory techniques;Sit to/from stand Toileting - Clothing Manipulation Details (indicate cue type and reason): educated on use of wet wipes and different methods to avoid twisting for pericare Tub/ Shower Transfer: Walk-in shower;Min guard;Cueing for safety;Ambulation Tub/Shower Transfer Details (indicate cue type and reason): Cues for wife to assist with getting in/out for first week Functional mobility during ADLs: Min guard General ADL Comments: Reviewed all precautions, compensatory strategies for ADLs, brace wear protocol, and proper positioning at rest. Also practiced with AE for LB ADLs - pt found equipment useful.      Vision Vision Assessment?: No apparent visual deficits   Perception     Praxis  Pertinent Vitals/Pain Pain Assessment: 0-10 Pain Score: 8  Faces Pain Scale: Hurts a little bit Pain Location:  incision site Pain Descriptors / Indicators: Tightness;Sore Pain Intervention(s): Monitored during session;Limited activity within patient's tolerance;Premedicated before session;Repositioned     Hand Dominance Right   Extremity/Trunk Assessment Upper Extremity Assessment Upper Extremity Assessment: Overall WFL for tasks assessed   Lower Extremity Assessment Lower Extremity Assessment: RLE deficits/detail RLE Deficits / Details: Decreased strength consistent with pre-op diagnosis/symptoms   Cervical / Trunk Assessment Cervical / Trunk Assessment: Other exceptions Cervical / Trunk Exceptions: Forward head/rounded shoulder posture. Pt with lumbar surgical incision.    Communication Communication Communication: No difficulties   Cognition Arousal/Alertness: Awake/alert Behavior During Therapy: WFL for tasks assessed/performed Overall Cognitive Status: Within Functional Limits for tasks assessed       Memory: Decreased recall of precautions             General Comments       Exercises       Shoulder Instructions      Home Living Family/patient expects to be discharged to:: Private residence Living Arrangements: Spouse/significant other Available Help at Discharge: Family;Available 24 hours/day (until Monday) Type of Home: House Home Access: Stairs to enter [employer] of Steps: 4 Entrance Stairs-Rails: Right Home Layout: One level (1 step down into kitchen)     Bathroom Shower/Tub: Occupational psychologist: Standard Bathroom Accessibility: Yes How Accessible: Accessible via walker Home Equipment: [place] - 2 wheels;Cane - single point;Shower seat - built in;Toilet riser          Prior Functioning/Environment Level of Independence: Independent             OT Diagnosis: Acute pain   OT Problem List: Decreased strength;Decreased range of motion;Decreased activity tolerance;Impaired balance (sitting and/or standing);Decreased safety  awareness;Decreased knowledge of use of DME or AE;Decreased knowledge of precautions;Pain   OT Treatment/Interventions:      OT Goals(Current goals can be found in the care plan section) Acute Rehab OT Goals Patient Stated Goal: to go home today OT Goal Formulation: With patient Time For Goal Achievement: 05/05/16 Potential to Achieve Goals: Good  OT Frequency:     Barriers to D/C:            Co-evaluation              End of Session Equipment Utilized During Treatment: Gait belt;Back brace Nurse Communication: Mobility status  Activity Tolerance: Patient tolerated treatment well Patient left: in chair;with call bell/phone within reach   Time: 0815-0835 OT Time Calculation (min): 20 min Charges:  OT General Charges $OT Visit: 1 Procedure OT Evaluation $OT Eval Moderate Complexity: 1 Procedure G-Codes:    Redmond Baseman, OTR/L [phone] 04/21/2016, 10:03 AM

## 2016-04-21 NOTE — Discharge Summary (Signed)
Physician Discharge Summary  Patient ID: CASIMIER DUMITRESCU MRN: [id number] DOB/AGE: 07/04/1956 60 y.o.  Admit date: 04/20/2016 Discharge date: 04/21/2016  Admission Diagnoses:  Discharge Diagnoses:  Active Problems:   Spondylolisthesis at L5-S1 level   Discharged Condition: good  Hospital Course: Patient was admitted to the hospital where he underwent an uncommon L5-S1 decompression and fusion. Postoperative he has done well.  The back and leg pain improved. Standing and ambulating without difficulty. Reported for discharge home.  Consults:   Significant Diagnostic Studies:   Treatments:   Discharge Exam: Blood pressure 127/42, pulse 74, temperature 98.2 F (36.8 C), temperature source Oral, resp. rate 18, height 5\' 9"  (1.753 m), weight 112.662 kg (248 lb 6 oz), SpO2 100 %. Awake and alert. Oriented and appropriate. Counter function intact. Motor and sensory function extremities normal. Wound clean and dry. Chest and abdomen benign.  Disposition: Final discharge disposition not confirmed     Medication List    STOP taking these medications        atorvastatin 20 MG tablet  Commonly known as:  LIPITOR      TAKE these medications        amLODipine 10 MG tablet  Commonly known as:  NORVASC  Take 1 tablet (10 mg total) by mouth daily.     aspirin 81 MG tablet  Take 81 mg by mouth daily.     diazepam 5 MG tablet  Commonly known as:  VALIUM  Take 1-2 tablets (5-10 mg total) by mouth every 6 (six) hours as needed for muscle spasms.     furosemide 20 MG tablet  Commonly known as:  LASIX  TAKE 1 TABLET (20 MG TOTAL) BY MOUTH DAILY.     oxyCODONE 20 mg 12 hr tablet  Commonly known as:  OXYCONTIN  Take 1 tablet (20 mg total) by mouth every 12 (twelve) hours.     oxyCODONE-acetaminophen 5-325 MG tablet  Commonly known as:  ROXICET  Take 1-2 tablets by mouth every 4 (four) hours as needed for severe pain. Maximum daily dose 6 tablets     oxyCODONE-acetaminophen  5-325 MG tablet  Commonly known as:  PERCOCET/ROXICET  Take 1-2 tablets by mouth every 4 (four) hours as needed for moderate pain.     RAPAFLO 8 MG Caps capsule  Generic drug:  silodosin  Take 1 capsule by mouth daily.     sertraline 100 MG tablet  Commonly known as:  ZOLOFT  TAKE ONE TABLET BY MOUTH ONCE DAILY     telmisartan 80 MG tablet  Commonly known as:  MICARDIS  TAKE 1 TABLET BY MOUTH DAILY.     traMADol 50 MG tablet  Commonly known as:  ULTRAM  TAKE ONE TABLET BY MOUTH EVERY 6 HOURS AS NEEDED           Follow-up Information    Follow up with Charlie Pitter, MD.   Specialty:  Neurosurgery   Contact information:   [address] [place] [address] [phone]       Signed: Charlie Pitter 04/21/2016, 10:19 AM

## 2016-04-21 NOTE — Progress Notes (Signed)
Patient alert and oriented, mae's well, voiding adequate amount of urine, swallowing without difficulty, c/o mild pain. Patient discharged home with spouse. Script and discharged instructions given to patient. Patient and spouse stated understanding of d/c instructions given and has an appointment with MD.

## 2016-04-21 NOTE — Discharge Instructions (Signed)

## 2016-04-26 MED FILL — Heparin Sodium (Porcine) Inj 1000 Unit/ML: INTRAMUSCULAR | Qty: 30 | Status: AC

## 2016-04-26 MED FILL — Sodium Chloride IV Soln 0.9%: INTRAVENOUS | Qty: 1000 | Status: AC

## 2016-04-30 ENCOUNTER — Encounter: Payer: Self-pay | Admitting: Internal Medicine

## 2016-04-30 MED ORDER — OXYCODONE HCL ER 20 MG PO T12A: 20.0000 mg | EXTENDED_RELEASE_TABLET | Freq: Two times a day (BID) | ORAL | Status: DC

## 2016-05-12 DIAGNOSIS — M4317 Spondylolisthesis, lumbosacral region: Secondary | ICD-10-CM | POA: Diagnosis not present

## 2016-05-12 DIAGNOSIS — Z6835 Body mass index (BMI) 35.0-35.9, adult: Secondary | ICD-10-CM | POA: Diagnosis not present

## 2016-05-13 ENCOUNTER — Encounter: Payer: Self-pay | Admitting: Internal Medicine

## 2016-05-14 MED ORDER — OXYCODONE-ACETAMINOPHEN 5-325 MG PO TABS: 1.0000 | ORAL_TABLET | ORAL | Status: DC | PRN

## 2016-05-14 NOTE — Telephone Encounter (Signed)
Requesting refill on percocet for breakthrough pain since surgery last fill was 04/08/16 for 90 .

## 2016-05-17 ENCOUNTER — Encounter: Payer: Self-pay | Admitting: Internal Medicine

## 2016-05-17 NOTE — Telephone Encounter (Signed)
Notified patient medication ready fro pick up and placed at front desk.

## 2016-06-04 ENCOUNTER — Other Ambulatory Visit: Payer: Self-pay | Admitting: Internal Medicine

## 2016-06-06 ENCOUNTER — Telehealth: Payer: Self-pay | Admitting: Internal Medicine

## 2016-06-06 NOTE — Telephone Encounter (Signed)
Patient needs an office visit on or before July 9th for medication refill.  It can be a 4:30 or an 11:30

## 2016-06-09 ENCOUNTER — Encounter: Payer: Self-pay | Admitting: Internal Medicine

## 2016-06-09 ENCOUNTER — Other Ambulatory Visit: Payer: Self-pay | Admitting: Internal Medicine

## 2016-06-09 MED ORDER — OXYCODONE-ACETAMINOPHEN 5-325 MG PO TABS: 1.0000 | ORAL_TABLET | Freq: Three times a day (TID) | ORAL | Status: DC | PRN

## 2016-06-09 NOTE — Telephone Encounter (Signed)
The patient has been reschedule to 7.7.17 at 11:30. He is aware.

## 2016-06-11 ENCOUNTER — Ambulatory Visit (INDEPENDENT_AMBULATORY_CARE_PROVIDER_SITE_OTHER): Payer: 59 | Admitting: Internal Medicine

## 2016-06-11 ENCOUNTER — Other Ambulatory Visit: Payer: Self-pay | Admitting: Internal Medicine

## 2016-06-11 ENCOUNTER — Encounter: Payer: Self-pay | Admitting: Internal Medicine

## 2016-06-11 VITALS — BP 148/98 | HR 70 | Temp 98.3°F | Resp 12 | Ht 69.0 in | Wt 246.0 lb

## 2016-06-11 DIAGNOSIS — M4317 Spondylolisthesis, lumbosacral region: Secondary | ICD-10-CM | POA: Diagnosis not present

## 2016-06-11 DIAGNOSIS — F1123 Opioid dependence with withdrawal: Secondary | ICD-10-CM

## 2016-06-11 MED ORDER — TIZANIDINE HCL 6 MG PO CAPS: 6.0000 mg | ORAL_CAPSULE | Freq: Three times a day (TID) | ORAL | Status: DC

## 2016-06-11 MED ORDER — DIAZEPAM 5 MG PO TABS: 5.0000 mg | ORAL_TABLET | Freq: Four times a day (QID) | ORAL | Status: DC | PRN

## 2016-06-11 NOTE — Progress Notes (Signed)
Pre-visit discussion using our clinic review tool. No additional management support is needed unless otherwise documented below in the visit note.  

## 2016-06-11 NOTE — Progress Notes (Signed)
Subjective:  Patient ID: Tyrone Mcdowell, male    DOB: 17-Feb-1956  Age: 60 y.o. MRN: [id number]  CC: The primary encounter diagnosis was Opioid dependence with withdrawal (Tyrone Mcdowell). A diagnosis of Spondylolisthesis at L5-S1 level was also pertinent to this visit.  HPI Tyrone Mcdowell presents for chronic pain , now with recent epidoes of narcotics withdrawal. tolerance.  Underwent lumbar spine surgery by Tyrone Mcdowell on May 16th but hds not reduced his use of narcotics since then.  Ran out of narcotics on Saturday July 1.  Develop d flu like symtoms with diarrhea, malaise, diaphoresis on Monday  By Wednesday he was felling weak and jittery  Appetite has been poor .  His oxycodone was refilled on July 6th  Got dehydrated.  Has not eaten much.  Starting to eat solid food.  Starting taking oxycodone again on July 6th and symptoms resolved.  Wants to taper off of medication this time and has no plans to continue narcotics now that he understands his recent symptoms were due to addiction.   Back pain a minor issues,  Ants to refill Valium to use as a MR.  Refill denied, rationnale discussed, and alternative  Offered.   Outpatient Prescriptions Prior to Visit  Medication Sig Dispense Refill  . amLODipine (NORVASC) 10 MG tablet Take 1 tablet (10 mg total) by mouth daily. 90 tablet 1  . aspirin 81 MG tablet Take 81 mg by mouth daily.    . furosemide (LASIX) 20 MG tablet TAKE 1 TABLET (20 MG TOTAL) BY MOUTH DAILY. 90 tablet 2  . oxyCODONE-acetaminophen (ROXICET) 5-325 MG tablet Take 1 tablet by mouth every 8 (eight) hours as needed for severe pain. Maximum daily dose 3 tablets 90 tablet 0  . RAPAFLO 8 MG CAPS capsule Take 1 capsule by mouth daily.  11  . sertraline (ZOLOFT) 100 MG tablet TAKE ONE TABLET BY MOUTH ONCE DAILY 90 tablet 2  . telmisartan (MICARDIS) 80 MG tablet TAKE 1 TABLET BY MOUTH DAILY. 30 tablet 3  . telmisartan (MICARDIS) 80 MG tablet TAKE 1 TABLET BY MOUTH DAILY. 30 tablet 4  .  traMADol (ULTRAM) 50 MG tablet TAKE ONE TABLET BY MOUTH EVERY 6 HOURS AS NEEDED 120 tablet 3  . diazepam (VALIUM) 5 MG tablet Take 1-2 tablets (5-10 mg total) by mouth every 6 (six) hours as needed for muscle spasms. 30 tablet 0  . oxyCODONE-acetaminophen (PERCOCET/ROXICET) 5-325 MG tablet Take 1-2 tablets by mouth every 4 (four) hours as needed for moderate pain. 90 tablet 0   No facility-administered medications prior to visit.    Review of Systems;  Patient denies headache, fevers, malaise, unintentional weight loss, skin rash, eye pain, sinus congestion and sinus pain, sore throat, dysphagia,  hemoptysis , cough, dyspnea, wheezing, chest pain, palpitations, orthopnea, edema, abdominal pain, nausea, melena, diarrhea, constipation, flank pain, dysuria, hematuria, urinary  Frequency, nocturia, numbness, tingling, seizures,  Focal weakness, Loss of consciousness,  Tremor, insomnia, depression, anxiety, and suicidal ideation.      Objective:  BP 148/98 mmHg  Pulse 70  Temp(Src) 98.3 F (36.8 C) (Oral)  Resp 12  Ht 5\' 9"  (1.753 m)  Wt 246 lb (111.585 kg)  BMI 36.31 kg/m2  SpO2 98%  BP Readings from Last 3 Encounters:  06/11/16 148/98  04/21/16 127/42  04/14/16 135/83    Wt Readings from Last 3 Encounters:  06/11/16 246 lb (111.585 kg)  04/20/16 248 lb 6 oz (112.662 kg)  04/14/16 248 lb 6.4 oz (112.674  kg)    General appearance: alert, cooperative and appears stated age Ears: normal TM's and external ear canals both ears Throat: lips, mucosa, and tongue normal; teeth and gums normal Neck: no adenopathy, no carotid bruit, supple, symmetrical, trachea midline and thyroid not enlarged, symmetric, no tenderness/mass/nodules Back: symmetric, no curvature. ROM normal. No CVA tenderness. Lungs: clear to auscultation bilaterally Heart: regular rate and rhythm, S1, S2 normal, no murmur, click, rub or gallop Abdomen: soft, non-tender; bowel sounds normal; no masses,  no  organomegaly Pulses: 2+ and symmetric Skin: Skin color, texture, turgor normal. No rashes or lesions Lymph nodes: Cervical, supraclavicular, and axillary nodes normal.  No results found for: HGBA1C  Lab Results  Component Value Date   CREATININE 0.65 04/14/2016   CREATININE 0.83 11/11/2015   CREATININE 0.89 06/02/2015    Lab Results  Component Value Date   WBC 4.2 04/14/2016   HGB 13.8 04/14/2016   HCT 42.3 04/14/2016   PLT 249 04/14/2016   GLUCOSE 103* 04/14/2016   CHOL 137 06/02/2015   TRIG 81.0 06/02/2015   HDL 41.30 06/02/2015   LDLDIRECT 142.8 10/23/2012   LDLCALC 80 06/02/2015   ALT 17 11/11/2015   AST 19 11/11/2015   NA 142 04/14/2016   K 4.3 04/14/2016   CL 104 04/14/2016   CREATININE 0.65 04/14/2016   BUN 7 04/14/2016   CO2 31 04/14/2016   PSA 0.50 06/24/2014   MICROALBUR <0.7 11/11/2015    No results found.  Assessment & Plan:   Problem List Items Addressed This Visit    Spondylolisthesis at L5-S1 level    S/p lumbar decompression May 2017.  Advised to wean off narcotics and use tramadol prn muscle relaxer given       Opioid dependence with withdrawal (Tyrone Mcdowell) - Primary    Secondary to discontinuation of oxycodone/oxycontin.  30 day refill was given several days ago, which he has resumed.   2 week taper discussed.          I have discontinued Tyrone Mcdowell diazepam and diazepam. I am also having him start on tizanidine. Additionally, I am having him maintain his aspirin, RAPAFLO, amLODipine, telmisartan, sertraline, traMADol, furosemide, oxyCODONE-acetaminophen, and telmisartan.  Meds ordered this encounter  Medications  . DISCONTD: diazepam (VALIUM) 5 MG tablet    Sig: Take 1-2 tablets (5-10 mg total) by mouth every 6 (six) hours as needed for muscle spasms.    Dispense:  30 tablet    Refill:  2  . tizanidine (ZANAFLEX) 6 MG capsule    Sig: Take 1 capsule (6 mg total) by mouth 3 (three) times daily.    Dispense:  90 capsule    Refill:  2     Medications Discontinued During This Encounter  Medication Reason  . diazepam (VALIUM) 5 MG tablet Reorder  . diazepam (VALIUM) 5 MG tablet   . oxyCODONE-acetaminophen (PERCOCET/ROXICET) 5-325 MG tablet     Follow-up: No Follow-up on file.   Tyrone Mc, MD

## 2016-06-13 DIAGNOSIS — F1120 Opioid dependence, uncomplicated: Secondary | ICD-10-CM | POA: Insufficient documentation

## 2016-06-13 DIAGNOSIS — F1123 Opioid dependence with withdrawal: Secondary | ICD-10-CM | POA: Insufficient documentation

## 2016-06-13 NOTE — Assessment & Plan Note (Signed)
S/p lumbar decompression May 2017.  Advised to wean off narcotics and use tramadol prn muscle relaxer given

## 2016-06-13 NOTE — Assessment & Plan Note (Signed)
Secondary to discontinuation of oxycodone/oxycontin.  30 day refill was given several days ago, which he has resumed.   2 week taper discussed.

## 2016-06-17 DIAGNOSIS — Z6835 Body mass index (BMI) 35.0-35.9, adult: Secondary | ICD-10-CM | POA: Diagnosis not present

## 2016-06-17 DIAGNOSIS — M4317 Spondylolisthesis, lumbosacral region: Secondary | ICD-10-CM | POA: Diagnosis not present

## 2016-07-09 ENCOUNTER — Ambulatory Visit: Payer: Medicare Other | Admitting: Internal Medicine

## 2016-07-13 ENCOUNTER — Other Ambulatory Visit: Payer: Self-pay | Admitting: Internal Medicine

## 2016-07-22 DIAGNOSIS — M4317 Spondylolisthesis, lumbosacral region: Secondary | ICD-10-CM | POA: Diagnosis not present

## 2016-07-23 ENCOUNTER — Encounter: Payer: Self-pay | Admitting: Internal Medicine

## 2016-07-23 NOTE — Telephone Encounter (Signed)
FYI

## 2016-08-17 ENCOUNTER — Encounter: Payer: Self-pay | Admitting: Internal Medicine

## 2016-08-18 ENCOUNTER — Other Ambulatory Visit: Payer: Self-pay

## 2016-08-18 MED ORDER — SILODOSIN 8 MG PO CAPS: 8.0000 mg | ORAL_CAPSULE | Freq: Every day | ORAL | 3 refills | Status: DC

## 2016-08-18 NOTE — Telephone Encounter (Signed)
Rx sent to pharmacy   

## 2016-10-15 ENCOUNTER — Other Ambulatory Visit: Payer: Self-pay | Admitting: Internal Medicine

## 2016-10-25 ENCOUNTER — Other Ambulatory Visit: Payer: Self-pay | Admitting: Internal Medicine

## 2016-10-25 NOTE — Patient Outreach (Signed)
Assigned Raina Mina RN to Mr. [place]ix[place] for case management.

## 2016-10-27 ENCOUNTER — Encounter: Payer: Self-pay | Admitting: *Deleted

## 2016-10-27 ENCOUNTER — Other Ambulatory Visit: Payer: Self-pay | Admitting: *Deleted

## 2016-10-27 NOTE — Patient Outreach (Signed)
[facility]) Care Management  10/27/2016  SID PERCH 04-Jul-1956 [id number]  RN completed a outreach call today and completed the needed screening. Pt reports his support system was his wife who is a Marine scientist. Pt states "everything" is under control since his back surgery as he slowly increased his activities. Pt verifies all his medications and the purpose of his medications. Pt has sufficient supply and able to afford his medications. Pt is aware of the [facility] services after this call today and indicates he does not need further assistance at this time however appreciative for the call today. Will notify the provider and close this case.  Raina Mina, RN Care Management Coordinator [place] Office [phone]

## 2016-11-11 DIAGNOSIS — M4317 Spondylolisthesis, lumbosacral region: Secondary | ICD-10-CM | POA: Diagnosis not present

## 2016-11-11 DIAGNOSIS — I10 Essential (primary) hypertension: Secondary | ICD-10-CM | POA: Diagnosis not present

## 2016-11-11 DIAGNOSIS — Z6841 Body Mass Index (BMI) 40.0 and over, adult: Secondary | ICD-10-CM | POA: Diagnosis not present

## 2016-11-15 ENCOUNTER — Other Ambulatory Visit: Payer: Self-pay | Admitting: Internal Medicine

## 2016-12-27 ENCOUNTER — Encounter: Payer: Self-pay | Admitting: Internal Medicine

## 2016-12-27 ENCOUNTER — Ambulatory Visit (INDEPENDENT_AMBULATORY_CARE_PROVIDER_SITE_OTHER): Payer: 59 | Admitting: Internal Medicine

## 2016-12-27 VITALS — BP 136/82 | HR 63 | Temp 98.4°F | Resp 16 | Ht 69.0 in | Wt 297.0 lb

## 2016-12-27 DIAGNOSIS — E6609 Other obesity due to excess calories: Secondary | ICD-10-CM

## 2016-12-27 DIAGNOSIS — I10 Essential (primary) hypertension: Secondary | ICD-10-CM

## 2016-12-27 DIAGNOSIS — R748 Abnormal levels of other serum enzymes: Secondary | ICD-10-CM

## 2016-12-27 DIAGNOSIS — Z23 Encounter for immunization: Secondary | ICD-10-CM

## 2016-12-27 DIAGNOSIS — Z113 Encounter for screening for infections with a predominantly sexual mode of transmission: Secondary | ICD-10-CM

## 2016-12-27 DIAGNOSIS — E559 Vitamin D deficiency, unspecified: Secondary | ICD-10-CM | POA: Diagnosis not present

## 2016-12-27 DIAGNOSIS — R635 Abnormal weight gain: Secondary | ICD-10-CM | POA: Diagnosis not present

## 2016-12-27 DIAGNOSIS — Z125 Encounter for screening for malignant neoplasm of prostate: Secondary | ICD-10-CM

## 2016-12-27 DIAGNOSIS — Z6839 Body mass index (BMI) 39.0-39.9, adult: Secondary | ICD-10-CM | POA: Diagnosis not present

## 2016-12-27 LAB — COMPREHENSIVE METABOLIC PANEL
ALK PHOS: 167 U/L — AB (ref 39–117)
ALT: 167 U/L — AB (ref 0–53)
AST: 83 U/L — ABNORMAL HIGH (ref 0–37)
Albumin: 4.1 g/dL (ref 3.5–5.2)
BILIRUBIN TOTAL: 0.5 mg/dL (ref 0.2–1.2)
BUN: 13 mg/dL (ref 6–23)
CALCIUM: 9.7 mg/dL (ref 8.4–10.5)
CO2: 34 mEq/L — ABNORMAL HIGH (ref 19–32)
Chloride: 102 mEq/L (ref 96–112)
Creatinine, Ser: 0.83 mg/dL (ref 0.40–1.50)
GFR: 101.05 mL/min (ref >60.00)
GLUCOSE: 104 mg/dL — AB (ref 70–99)
Potassium: 4.6 mEq/L (ref 3.5–5.1)
Sodium: 142 mEq/L (ref 135–145)
TOTAL PROTEIN: 7.1 g/dL (ref 6.0–8.3)

## 2016-12-27 LAB — LIPID PANEL
CHOL/HDL RATIO: 4
CHOLESTEROL: 207 mg/dL — AB (ref 0–200)
HDL: 53.2 mg/dL (ref >39.00)
LDL Cholesterol: 135 mg/dL — ABNORMAL HIGH (ref 0–99)
NonHDL: 154.18
TRIGLYCERIDES: 97 mg/dL (ref 0.0–149.0)
VLDL: 19.4 mg/dL (ref 0.0–40.0)

## 2016-12-27 LAB — TSH: TSH: 0.53 u[IU]/mL (ref 0.35–4.50)

## 2016-12-27 LAB — HIV ANTIBODY (ROUTINE TESTING W REFLEX): HIV 1&2 Ab, 4th Generation: NONREACTIVE

## 2016-12-27 LAB — VITAMIN D 25 HYDROXY (VIT D DEFICIENCY, FRACTURES): VITD: 25.79 ng/mL — ABNORMAL LOW (ref 30.00–100.00)

## 2016-12-27 LAB — PSA: PSA: 0.32 ng/mL (ref 0.10–4.00)

## 2016-12-27 MED ORDER — TELMISARTAN-HCTZ 80-12.5 MG PO TABS: 1.0000 | ORAL_TABLET | Freq: Every day | ORAL | 5 refills | Status: DC

## 2016-12-27 NOTE — Progress Notes (Addendum)
Subjective:  Patient ID: Tyrone Mcdowell, male    DOB: [birth year]  Age: 61 y.o. MRN: [id number]  CC: The primary encounter diagnosis was Screen for STD (sexually transmitted disease). Diagnoses of Class 2 obesity due to excess calories without serious comorbidity with body mass index (BMI) of 39.0 to 39.9 in adult, Prostate cancer screening, Weight gain, Vitamin D deficiency, Encounter for immunization, Essential hypertension, and Elevated liver enzymes were also pertinent to this visit.  HPI Tyrone Mcdowell presents for follow up on hypertension , obesity and back pain   50 lb weight gain since July.  Has been less active since his back surgery . He had been overindulging and "wallowing in self pity"  since his back pain did not completely resolve after his surgery, but since Jan 1 he has lost 7 lbs  And has resumed a walking program. He is using tylenol and ibuprofen for pain control.   Following a low carb diet   Personal goal for weight 10-14 lbs per month   BP measurements at home 130-140/80-90 range   Discussed adding 12.5 mg hctz.  To telmisartan and amlodipine  for goal 120/70      Outpatient Medications Prior to Visit  Medication Sig Dispense Refill  . amLODipine (NORVASC) 10 MG tablet TAKE 1 TABLET (10 MG TOTAL) BY MOUTH DAILY. 90 tablet 1  . aspirin 81 MG tablet Take 81 mg by mouth daily.    . furosemide (LASIX) 20 MG tablet TAKE 1 TABLET (20 MG TOTAL) BY MOUTH DAILY. 90 tablet 2  . sertraline (ZOLOFT) 100 MG tablet TAKE ONE TABLET BY MOUTH ONCE DAILY 90 tablet 0  . silodosin (RAPAFLO) 8 MG CAPS capsule Take 1 capsule (8 mg total) by mouth daily. 30 capsule 3  . telmisartan (MICARDIS) 80 MG tablet TAKE 1 TABLET BY MOUTH DAILY. 30 tablet 3  . telmisartan (MICARDIS) 80 MG tablet Take 1 tablet (80 mg total) by mouth daily. MUST SCHEDULE OFFICE VISIT FOR FURTHER REFILLS. NEEDS LAB. 30 tablet 1  . oxyCODONE-acetaminophen (ROXICET) 5-325 MG tablet Take 1 tablet by mouth every 8  (eight) hours as needed for severe pain. Maximum daily dose 3 tablets 90 tablet 0  . tizanidine (ZANAFLEX) 6 MG capsule TAKE 1 CAPSULE BY MOUTH 3 TIMES DAILY. 90 capsule 2  . traMADol (ULTRAM) 50 MG tablet TAKE ONE TABLET BY MOUTH EVERY 6 HOURS AS NEEDED 120 tablet 3   No facility-administered medications prior to visit.     Review of Systems;  Patient denies headache, fevers, malaise, , skin rash, eye pain, sinus congestion and sinus pain, sore throat, dysphagia,  hemoptysis , cough, dyspnea, wheezing, chest pain, palpitations, orthopnea, edema, abdominal pain,  melena, diarrhea, constipation, flank pain, dysuria, hematuria, urinary  Frequency, nocturia, numbness, tingling, seizures,  Focal weakness, Loss of consciousness,  Tremor, insomnia, depression, anxiety, and suicidal ideation.      Objective:  BP 136/82   Pulse 63   Temp 98.4 F (36.9 C) (Oral)   Resp 16   Ht 5\' 9"  (1.753 m)   Wt 297 lb (134.7 kg)   SpO2 96%   BMI 43.86 kg/m   BP Readings from Last 3 Encounters:  12/27/16 136/82  06/11/16 (!) 148/98  04/21/16 (!) 127/42    Wt Readings from Last 3 Encounters:  12/27/16 297 lb (134.7 kg)  06/11/16 246 lb (111.6 kg)  04/20/16 248 lb 6 oz (112.7 kg)    General appearance: alert, cooperative and appears stated age Ears:  normal TM's and external ear canals both ears Throat: lips, mucosa, and tongue normal; teeth and gums normal Neck: no adenopathy, no carotid bruit, supple, symmetrical, trachea midline and thyroid not enlarged, symmetric, no tenderness/mass/nodules Back: symmetric, no curvature. ROM normal. No CVA tenderness. Lungs: clear to auscultation bilaterally Heart: regular rate and rhythm, S1, S2 normal, no murmur, click, rub or gallop Abdomen: soft, non-tender; bowel sounds normal; no masses,  no organomegaly Pulses: 2+ and symmetric Skin: Skin color, texture, turgor normal. No rashes or lesions Lymph nodes: Cervical, supraclavicular, and axillary nodes  normal.  No results found for: HGBA1C  Lab Results  Component Value Date   CREATININE 0.83 12/27/2016   CREATININE 0.65 04/14/2016   CREATININE 0.83 11/11/2015    Lab Results  Component Value Date   WBC 4.2 04/14/2016   HGB 13.8 04/14/2016   HCT 42.3 04/14/2016   PLT 249 04/14/2016   GLUCOSE 104 (H) 12/27/2016   CHOL 207 (H) 12/27/2016   TRIG 97.0 12/27/2016   HDL 53.20 12/27/2016   LDLDIRECT 142.8 10/23/2012   LDLCALC 135 (H) 12/27/2016   ALT 167 (H) 12/27/2016   AST 83 (H) 12/27/2016   NA 142 12/27/2016   K 4.6 12/27/2016   CL 102 12/27/2016   CREATININE 0.83 12/27/2016   BUN 13 12/27/2016   CO2 34 (H) 12/27/2016   TSH 0.53 12/27/2016   PSA 0.32 12/27/2016   MICROALBUR <0.7 11/11/2015    No results found.  Assessment & Plan:   Problem List Items Addressed This Visit    Elevated liver enzymes    Etiology unclear.  He is asymptomatic. [name]  Suspect  secondary to fatty liver aggravated by weight gain. Will repeat ,  Screen for autoimmune etiologies and obtain ultrasound       Hypertension    Not at goal,  Adding hctz to telmisartan.  Lab Results  Component Value Date   CREATININE 0.83 12/27/2016   Lab Results  Component Value Date   NA 142 12/27/2016   K 4.6 12/27/2016   CL 102 12/27/2016   CO2 34 (H) 12/27/2016         Relevant Medications   telmisartan-hydrochlorothiazide (MICARDIS HCT) 80-12.5 MG tablet   Obesity    With a weight gain of over 60 lbs,  And recent loss of 7 , I have congratulated him in reduction of   BMI and encouraged  Continued weight loss with goal of 10% of body weigh over the next 6 months using a low glycemic index diet and regular exercise a minimum of 5 days per week.        Relevant Orders   PSA (Completed)    Other Visit Diagnoses    Screen for STD (sexually transmitted disease)    -  Primary   Relevant Orders   HIV antibody (Completed)   Hepatitis C antibody (Completed)   Prostate cancer screening       Weight  gain       Relevant Orders   Comprehensive metabolic panel (Completed)   Lipid panel (Completed)   TSH (Completed)   Vitamin D deficiency       Relevant Orders   VITAMIN D 25 Hydroxy (Vit-D Deficiency, Fractures) (Completed)   Encounter for immunization       Relevant Orders   Flu Vaccine QUAD 36+ mos IM (Completed)      I have discontinued Mr. Icenhour's traMADol, oxyCODONE-acetaminophen, and tizanidine. I am also having him start on telmisartan-hydrochlorothiazide. Additionally, I am having  him maintain his aspirin, telmisartan, furosemide, amLODipine, silodosin, sertraline, and telmisartan.  Meds ordered this encounter  Medications  . telmisartan-hydrochlorothiazide (MICARDIS HCT) 80-12.5 MG tablet    Sig: Take 1 tablet by mouth daily.    Dispense:  30 tablet    Refill:  5    Medications Discontinued During This Encounter  Medication Reason  . oxyCODONE-acetaminophen (ROXICET) 5-325 MG tablet Patient Discharge  . tizanidine (ZANAFLEX) 6 MG capsule Patient has not taken in last 30 days  . traMADol (ULTRAM) 50 MG tablet No longer needed (for PRN medications)    Follow-up: Return in about 6 months (around 06/26/2017).   [name], MD

## 2016-12-27 NOTE — Patient Instructions (Addendum)
Your blood pressure is improrved but not at goal (120/70)  I am CHANGING the telmisartan to include 12. 5 mg of HCTZ(combination pill)  YOU CAN SUSPEND THE FUROSEMIDE SINCE HCTZ IS ALSO A FLUID PILL   I'M GLAD YOU ARE LOSING WEIGHT.  YOUR LONG TERM GOAL IS 215 LBS FOR BMI < 30

## 2016-12-27 NOTE — Progress Notes (Signed)
Pre-visit discussion using our clinic review tool. No additional management support is needed unless otherwise documented below in the visit note.  

## 2016-12-28 LAB — HEPATITIS C ANTIBODY: HCV AB: NEGATIVE

## 2016-12-28 NOTE — Assessment & Plan Note (Addendum)
With a weight gain of over 60 lbs,  And recent loss of 7 , I have congratulated him in reduction of   BMI and encouraged  Continued weight loss with goal of 10% of body weigh over the next 6 months using a low glycemic index diet and regular exercise a minimum of 5 days per week.

## 2016-12-28 NOTE — Assessment & Plan Note (Addendum)
Not at goal,  Adding hctz to telmisartan.  Lab Results  Component Value Date   CREATININE 0.83 12/27/2016   Lab Results  Component Value Date   NA 142 12/27/2016   K 4.6 12/27/2016   CL 102 12/27/2016   CO2 34 (H) 12/27/2016

## 2016-12-30 ENCOUNTER — Other Ambulatory Visit: Payer: Self-pay | Admitting: Internal Medicine

## 2016-12-30 ENCOUNTER — Encounter: Payer: Self-pay | Admitting: Internal Medicine

## 2016-12-30 DIAGNOSIS — R748 Abnormal levels of other serum enzymes: Secondary | ICD-10-CM

## 2016-12-30 NOTE — Assessment & Plan Note (Addendum)
Etiology unclear.  He is asymptomatic. [name]  Suspect  secondary to fatty liver aggravated by weight gain. Will repeat ,  Screen for autoimmune etiologies and obtain ultrasound

## 2017-01-11 ENCOUNTER — Ambulatory Visit
Admission: RE | Admit: 2017-01-11 | Discharge: 2017-01-11 | Disposition: A | Discharge status: Home / Self Care | Payer: 59 | Source: Ambulatory Visit | Attending: Internal Medicine | Admitting: Internal Medicine

## 2017-01-11 DIAGNOSIS — E669 Obesity, unspecified: Secondary | ICD-10-CM | POA: Insufficient documentation

## 2017-01-11 DIAGNOSIS — R748 Abnormal levels of other serum enzymes: Secondary | ICD-10-CM | POA: Insufficient documentation

## 2017-01-11 DIAGNOSIS — K760 Fatty (change of) liver, not elsewhere classified: Secondary | ICD-10-CM | POA: Diagnosis not present

## 2017-01-12 ENCOUNTER — Encounter: Payer: Self-pay | Admitting: Internal Medicine

## 2017-01-12 DIAGNOSIS — K760 Fatty (change of) liver, not elsewhere classified: Secondary | ICD-10-CM | POA: Insufficient documentation

## 2017-01-17 ENCOUNTER — Other Ambulatory Visit: Payer: Self-pay | Admitting: Internal Medicine

## 2017-01-17 NOTE — Telephone Encounter (Signed)
Pt last refill on Zoloft was on 10/15/16. Pt last OV was on 12/27/16. Ok to refill?

## 2017-01-17 NOTE — Telephone Encounter (Signed)
REFILLED

## 2017-01-18 ENCOUNTER — Other Ambulatory Visit: Payer: Medicare Other

## 2017-02-14 ENCOUNTER — Other Ambulatory Visit: Payer: Self-pay | Admitting: Internal Medicine

## 2017-03-30 ENCOUNTER — Other Ambulatory Visit: Payer: Self-pay | Admitting: Internal Medicine

## 2017-04-15 ENCOUNTER — Other Ambulatory Visit: Payer: Self-pay | Admitting: Internal Medicine

## 2017-05-06 ENCOUNTER — Encounter: Payer: Self-pay | Admitting: Internal Medicine

## 2017-05-06 ENCOUNTER — Ambulatory Visit (INDEPENDENT_AMBULATORY_CARE_PROVIDER_SITE_OTHER): Payer: 59 | Admitting: Internal Medicine

## 2017-05-06 DIAGNOSIS — R748 Abnormal levels of other serum enzymes: Secondary | ICD-10-CM

## 2017-05-06 DIAGNOSIS — M4317 Spondylolisthesis, lumbosacral region: Secondary | ICD-10-CM | POA: Diagnosis not present

## 2017-05-06 DIAGNOSIS — I10 Essential (primary) hypertension: Secondary | ICD-10-CM

## 2017-05-06 LAB — HEPATITIS B SURFACE ANTIGEN: HEP B S AG: NEGATIVE

## 2017-05-06 LAB — HEPATIC FUNCTION PANEL
ALBUMIN: 4.3 g/dL (ref 3.6–5.1)
ALT: 23 U/L (ref 9–46)
AST: 20 U/L (ref 10–35)
Alkaline Phosphatase: 94 U/L (ref 40–115)
Bilirubin, Direct: 0.1 mg/dL (ref <=0.2)
Indirect Bilirubin: 0.5 mg/dL (ref 0.2–1.2)
TOTAL PROTEIN: 7.2 g/dL (ref 6.1–8.1)
Total Bilirubin: 0.6 mg/dL (ref 0.2–1.2)

## 2017-05-06 LAB — HEPATITIS B SURFACE ANTIBODY,QUALITATIVE: Hep B S Ab: NEGATIVE

## 2017-05-06 LAB — IRON AND TIBC
%SAT: 35 % (ref 15–60)
IRON: 128 ug/dL (ref 50–180)
TIBC: 364 ug/dL (ref 250–425)
UIBC: 236 ug/dL

## 2017-05-06 LAB — HEPATITIS B CORE ANTIBODY, TOTAL: Hep B Core Total Ab: NONREACTIVE

## 2017-05-06 LAB — HEPATITIS C ANTIBODY: HCV AB: NEGATIVE

## 2017-05-06 MED ORDER — DULOXETINE HCL 30 MG PO CPEP: 30.0000 mg | ORAL_CAPSULE | Freq: Every day | ORAL | 3 refills | Status: DC

## 2017-05-06 MED ORDER — HYDROCHLOROTHIAZIDE 25 MG PO TABS: 25.0000 mg | ORAL_TABLET | Freq: Every day | ORAL | 3 refills | Status: DC

## 2017-05-06 MED ORDER — VALSARTAN 160 MG PO TABS: 160.0000 mg | ORAL_TABLET | Freq: Every day | ORAL | 0 refills | Status: DC

## 2017-05-06 MED ORDER — TRAMADOL HCL 50 MG PO TABS: 50.0000 mg | ORAL_TABLET | Freq: Two times a day (BID) | ORAL | 5 refills | Status: DC

## 2017-05-06 NOTE — Patient Instructions (Addendum)
I am prescribing tramadol to take SPARINGLY  for severe pian,  Not more than 2/daily  I am adding cymbalta for management of your pain as well . Start with one tablet daily  For 2 weeks ,  Increase  to 2 daily thereafter  When  you get to the point where you are taking 2 daily ,reduce the zoloft dose to 50 mg (1/2 tablet ) for one week,  Then stop it  Completely( the zoloft).    Your blood pressure is not at goal yet:  I am changing telmisartan to valsartan for cost savings,   And separating it from the  hctz so you can suspend the hctz on the days you work in the yard   The hctz will be at the 25 mg (higher) dose )

## 2017-05-06 NOTE — Addendum Note (Signed)
Addended by: Leeanne Rio on: 05/06/2017 11:30 AM   Modules accepted: Orders

## 2017-05-06 NOTE — Progress Notes (Signed)
Subjective:  Patient ID: Tyrone Mcdowell, male    DOB: [birth year]  Age: 61 y.o. MRN: [id number]  CC: Diagnoses of Elevated liver enzymes, Essential hypertension, and Spondylolisthesis at L5-S1 level were pertinent to this visit.  HPI TOA MIA presents for follow up on hypertension  And obesity.  Patient is taking his medications as prescribed and notes no adverse effects.  Home BP readings have been done about once per week and are  generally < 150/90 .  he is avoiding added salt in her diet and walking regularly about 3 times per week for exercise  .  Wt change 297 to 288  ( 9 lbs since January) .  Dissatisfied  with his rate of loss .  Has not been able to exercise more due to constant pain.  Does not want to resume narcotics given his history of withdrawal following his back surgery last year.  But tylenol and NSAIDs have not been effective in managing pain some days. Dicussed adding 1-2 tramadol daily.  Quit drinking 2 years ago ,  Quit smoking,  Quit dipping snuff   Outpatient Medications Prior to Visit  Medication Sig Dispense Refill  . amLODipine (NORVASC) 10 MG tablet TAKE 1 TABLET BY MOUTH DAILY 90 tablet 1  . aspirin 81 MG tablet Take 81 mg by mouth daily.    . furosemide (LASIX) 20 MG tablet TAKE 1 TABLET (20 MG TOTAL) BY MOUTH DAILY. 90 tablet 2  . sertraline (ZOLOFT) 100 MG tablet TAKE 1 TABLET BY MOUTH ONCE DAILY 90 tablet 0  . tizanidine (ZANAFLEX) 6 MG capsule TAKE 1 CAPSULE BY MOUTH 3 TIMES DAILY. 90 capsule 2  . telmisartan-hydrochlorothiazide (MICARDIS HCT) 80-12.5 MG tablet Take 1 tablet by mouth daily. 30 tablet 5  . silodosin (RAPAFLO) 8 MG CAPS capsule Take 1 capsule (8 mg total) by mouth daily. (Patient not taking: Reported on 05/06/2017) 30 capsule 3   No facility-administered medications prior to visit.     Review of Systems;  Patient denies headache, fevers, malaise, unintentional weight loss, skin rash, eye pain, sinus congestion and sinus pain, sore  throat, dysphagia,  hemoptysis , cough, dyspnea, wheezing, chest pain, palpitations, orthopnea, edema, abdominal pain, nausea, melena, diarrhea, constipation, flank pain, dysuria, hematuria, urinary  Frequency, nocturia, numbness, tingling, seizures,  Focal weakness, Loss of consciousness,  Tremor, insomnia, depression, anxiety, and suicidal ideation.      Objective:  BP (!) 142/96 (BP Location: Left Arm, Patient Position: Sitting, Cuff Size: Large)   Pulse 67   Temp 98 F (36.7 C) (Oral)   Resp 17   Ht 5\' 9"  (1.753 m)   Wt 288 lb (130.6 kg)   SpO2 95%   BMI 42.53 kg/m   BP Readings from Last 3 Encounters:  05/06/17 (!) 142/96  12/27/16 136/82  06/11/16 (!) 148/98    Wt Readings from Last 3 Encounters:  05/06/17 288 lb (130.6 kg)  12/27/16 297 lb (134.7 kg)  06/11/16 246 lb (111.6 kg)    General appearance: alert, cooperative and appears stated age Ears: normal TM's and external ear canals both ears Throat: lips, mucosa, and tongue normal; teeth and gums normal Neck: no adenopathy, no carotid bruit, supple, symmetrical, trachea midline and thyroid not enlarged, symmetric, no tenderness/mass/nodules Back: symmetric, no curvature. ROM normal. No CVA tenderness. Lungs: clear to auscultation bilaterally Heart: regular rate and rhythm, S1, S2 normal, no murmur, click, rub or gallop Abdomen: soft, non-tender; bowel sounds normal; no masses,  no organomegaly Pulses:  2+ and symmetric Skin: Skin color, texture, turgor normal. No rashes or lesions Lymph nodes: Cervical, supraclavicular, and axillary nodes normal.  No results found for: HGBA1C  Lab Results  Component Value Date   CREATININE 0.83 12/27/2016   CREATININE 0.65 04/14/2016   CREATININE 0.83 11/11/2015    Lab Results  Component Value Date   WBC 4.2 04/14/2016   HGB 13.8 04/14/2016   HCT 42.3 04/14/2016   PLT 249 04/14/2016   GLUCOSE 104 (H) 12/27/2016   CHOL 207 (H) 12/27/2016   TRIG 97.0 12/27/2016   HDL  53.20 12/27/2016   LDLDIRECT 142.8 10/23/2012   LDLCALC 135 (H) 12/27/2016   ALT 167 (H) 12/27/2016   AST 83 (H) 12/27/2016   NA 142 12/27/2016   K 4.6 12/27/2016   CL 102 12/27/2016   CREATININE 0.83 12/27/2016   BUN 13 12/27/2016   CO2 34 (H) 12/27/2016   TSH 0.53 12/27/2016   PSA 0.32 12/27/2016   MICROALBUR <0.7 11/11/2015    US Abdomen Limited Ruq  Result Date: 01/11/2017 CLINICAL DATA:  61 year old male with new onset elevated liver enzymes. Initial encounter. EXAM: US ABDOMEN LIMITED - RIGHT UPPER QUADRANT COMPARISON:  None. FINDINGS: Gallbladder: No gallstones or wall thickening visualized. No sonographic Murphy sign noted by sonographer. Common bile duct: Diameter: 3.3 mm proximally. Mid to distal aspect not visualized secondary to bowel gas. Liver: Diffuse increased echogenicity consistent with fatty infiltration. No obvious mass or intrahepatic biliary duct dilation. IMPRESSION: Fatty liver. Electronically Signed   By: Genia Del M.D.   On: 01/11/2017 08:43    Assessment & Plan:   Problem List Items Addressed This Visit    Spondylolisthesis at L5-S1 level    S/p lumbar decompression May 2017.  He has weaned off narcotics and tramadol but has persistent pain.  adding cymbalta and maximal use of 2 tramadol daily        Hypertension    Not at goal, increasing telmisartan dose to 160 mg  Daily. Advised to suspend hctz on the days he works in the yard.   Lab Results  Component Value Date   CREATININE 0.83 12/27/2016   Lab Results  Component Value Date   NA 142 12/27/2016   K 4.6 12/27/2016   CL 102 12/27/2016   CO2 34 (H) 12/27/2016         Relevant Medications   valsartan (DIOVAN) 160 MG tablet   hydrochlorothiazide (HYDRODIURIL) 25 MG tablet   RESOLVED: Elevated liver enzymes      I have discontinued Mr. Lizak's silodosin and telmisartan-hydrochlorothiazide. I am also having him start on traMADol, DULoxetine, valsartan, and hydrochlorothiazide.  Additionally, I am having him maintain his aspirin, furosemide, amLODipine, tizanidine, and sertraline.  Meds ordered this encounter  Medications  . traMADol (ULTRAM) 50 MG tablet    Sig: Take 1 tablet (50 mg total) by mouth 2 (two) times daily.    Dispense:  60 tablet    Refill:  5  . DULoxetine (CYMBALTA) 30 MG capsule    Sig: Take 1 capsule (30 mg total) by mouth daily. Increase to twice daily after 2 weeks    Dispense:  60 capsule    Refill:  3  . valsartan (DIOVAN) 160 MG tablet    Sig: Take 1 tablet (160 mg total) by mouth daily.    Dispense:  90 tablet    Refill:  0  . hydrochlorothiazide (HYDRODIURIL) 25 MG tablet    Sig: Take 1 tablet (25 mg total) by  mouth daily.    Dispense:  90 tablet    Refill:  3    Medications Discontinued During This Encounter  Medication Reason  . silodosin (RAPAFLO) 8 MG CAPS capsule Patient has not taken in last 30 days  . telmisartan-hydrochlorothiazide (MICARDIS HCT) 80-12.5 MG tablet     Follow-up: Return in about 6 months (around 11/05/2017).   [name], MD

## 2017-05-07 LAB — FERRITIN: Ferritin: 43 ng/mL (ref 20–380)

## 2017-05-08 NOTE — Assessment & Plan Note (Signed)
S/p lumbar decompression May 2017.  He has weaned off narcotics and tramadol but has persistent pain.  adding cymbalta and maximal use of 2 tramadol daily

## 2017-05-08 NOTE — Assessment & Plan Note (Addendum)
Not at goal, increasing telmisartan dose to 160 mg  Daily. Advised to suspend hctz on the days he works in the yard.   Lab Results  Component Value Date   CREATININE 0.83 12/27/2016   Lab Results  Component Value Date   NA 142 12/27/2016   K 4.6 12/27/2016   CL 102 12/27/2016   CO2 34 (H) 12/27/2016

## 2017-05-09 LAB — ANA: Anti Nuclear Antibody(ANA): POSITIVE — AB

## 2017-05-09 LAB — ANTI-SMITH ANTIBODY: ENA SM Ab Ser-aCnc: <1

## 2017-05-09 LAB — ANTI-NUCLEAR AB-TITER (ANA TITER): ANA Titer 1: 1:40 {titer} — ABNORMAL HIGH

## 2017-05-10 ENCOUNTER — Encounter: Payer: Self-pay | Admitting: Internal Medicine

## 2017-07-15 ENCOUNTER — Other Ambulatory Visit: Payer: Self-pay | Admitting: Internal Medicine

## 2017-08-03 ENCOUNTER — Other Ambulatory Visit: Payer: Self-pay | Admitting: Internal Medicine

## 2017-08-15 ENCOUNTER — Other Ambulatory Visit: Payer: Self-pay | Admitting: Internal Medicine

## 2017-08-23 DIAGNOSIS — H5213 Myopia, bilateral: Secondary | ICD-10-CM | POA: Diagnosis not present

## 2017-08-23 DIAGNOSIS — H43313 Vitreous membranes and strands, bilateral: Secondary | ICD-10-CM | POA: Diagnosis not present

## 2017-09-12 ENCOUNTER — Ambulatory Visit (INDEPENDENT_AMBULATORY_CARE_PROVIDER_SITE_OTHER): Payer: 59

## 2017-09-12 DIAGNOSIS — Z23 Encounter for immunization: Secondary | ICD-10-CM | POA: Diagnosis not present

## 2017-09-22 ENCOUNTER — Other Ambulatory Visit: Payer: Self-pay | Admitting: Internal Medicine

## 2017-11-07 ENCOUNTER — Ambulatory Visit (INDEPENDENT_AMBULATORY_CARE_PROVIDER_SITE_OTHER): Payer: 59 | Admitting: Internal Medicine

## 2017-11-07 ENCOUNTER — Encounter: Payer: Self-pay | Admitting: Internal Medicine

## 2017-11-07 ENCOUNTER — Other Ambulatory Visit: Payer: Self-pay | Admitting: Internal Medicine

## 2017-11-07 VITALS — BP 180/108 | HR 64 | Temp 97.7°F | Ht 69.0 in | Wt 293.2 lb

## 2017-11-07 DIAGNOSIS — M545 Low back pain, unspecified: Secondary | ICD-10-CM

## 2017-11-07 DIAGNOSIS — E785 Hyperlipidemia, unspecified: Secondary | ICD-10-CM | POA: Diagnosis not present

## 2017-11-07 DIAGNOSIS — G8929 Other chronic pain: Secondary | ICD-10-CM

## 2017-11-07 DIAGNOSIS — R635 Abnormal weight gain: Secondary | ICD-10-CM

## 2017-11-07 DIAGNOSIS — M4317 Spondylolisthesis, lumbosacral region: Secondary | ICD-10-CM | POA: Diagnosis not present

## 2017-11-07 DIAGNOSIS — I10 Essential (primary) hypertension: Secondary | ICD-10-CM | POA: Diagnosis not present

## 2017-11-07 LAB — COMPREHENSIVE METABOLIC PANEL
ALK PHOS: 73 U/L (ref 39–117)
ALT: 19 U/L (ref 0–53)
AST: 19 U/L (ref 0–37)
Albumin: 4.4 g/dL (ref 3.5–5.2)
BILIRUBIN TOTAL: 0.5 mg/dL (ref 0.2–1.2)
BUN: 16 mg/dL (ref 6–23)
CALCIUM: 10.4 mg/dL (ref 8.4–10.5)
CO2: 32 meq/L (ref 19–32)
Chloride: 99 mEq/L (ref 96–112)
Creatinine, Ser: 0.9 mg/dL (ref 0.40–1.50)
GFR: 91.76 mL/min (ref >60.00)
GLUCOSE: 100 mg/dL — AB (ref 70–99)
POTASSIUM: 4.3 meq/L (ref 3.5–5.1)
Sodium: 140 mEq/L (ref 135–145)
TOTAL PROTEIN: 7.7 g/dL (ref 6.0–8.3)

## 2017-11-07 LAB — TSH: TSH: 1.21 u[IU]/mL (ref 0.35–4.50)

## 2017-11-07 LAB — HEMOGLOBIN A1C: Hgb A1c MFr Bld: 5.9 % (ref 4.6–6.5)

## 2017-11-07 LAB — LIPID PANEL
CHOL/HDL RATIO: 5
CHOLESTEROL: 225 mg/dL — AB (ref 0–200)
HDL: 43.5 mg/dL (ref >39.00)
LDL Cholesterol: 145 mg/dL — ABNORMAL HIGH (ref 0–99)
NonHDL: 181.99
TRIGLYCERIDES: 183 mg/dL — AB (ref 0.0–149.0)
VLDL: 36.6 mg/dL (ref 0.0–40.0)

## 2017-11-07 MED ORDER — GABAPENTIN 100 MG PO CAPS: 100.0000 mg | ORAL_CAPSULE | Freq: Three times a day (TID) | ORAL | 2 refills | Status: DC

## 2017-11-07 MED ORDER — PREDNISONE 10 MG PO TABS: ORAL_TABLET | ORAL | 0 refills | Status: DC

## 2017-11-07 NOTE — Patient Instructions (Addendum)
I am prescribing prednisone starting at 60 mg daily for 3 days,  Then a daily taper  Adding gabapentin,  For nerve pain,  100 mg three times daily or 3 pills once  Or twice daily  (whatever helps)  No more aspirin products  Ok to use 2000 mg of tyoenol daily in divided doses  Suspend ibuprofen while you are on prednisone ,  May resume in 2 weeks up to 800 mg every 8 hours    Please get Maudie Mercury to check your BP at home ASAP  So I can adjust your medications   MRi ordered m along with referral to Dr Sharlet Salina

## 2017-11-07 NOTE — Progress Notes (Signed)
Pre visit review using our clinic review tool, if applicable. No additional management support is needed unless otherwise documented below in the visit note. 

## 2017-11-07 NOTE — Assessment & Plan Note (Signed)
S/p lumbar decompression May 2017.  He has weaned off narcotics and tramadol but has had an increase in pain that now radiates up his spine despite addingcymbalta and maximal use of 2 tramadol daily  . MRI lumbar spine ordered along with referral to Dr Sharlet Salina.  Prednisone taper and gabapentin trial

## 2017-11-07 NOTE — Assessment & Plan Note (Signed)
Based on his untreated lipid profile, the risk of clinically significant CAD is 16% over the next 10 years, using the Framingham risk calculator.  He has again deferred treatment.   Lab Results  Component Value Date   CHOL 225 (H) 11/07/2017   HDL 43.50 11/07/2017   LDLCALC 145 (H) 11/07/2017   LDLDIRECT 142.8 10/23/2012   TRIG 183.0 (H) 11/07/2017   CHOLHDL 5 11/07/2017   Lab Results  Component Value Date   ALT 19 11/07/2017   AST 19 11/07/2017   ALKPHOS 73 11/07/2017   BILITOT 0.5 11/07/2017

## 2017-11-07 NOTE — Assessment & Plan Note (Signed)
Uncontrolled, secondary to pain . maxed out on amlodipine, valsartan , and hctz. . he reports compliance with medication regimen .    He has been asked to check his BP at work and  submit readings for evaluation. Renal function will be checked today

## 2017-11-07 NOTE — Progress Notes (Signed)
Subjective:  Patient ID: Tyrone Mcdowell, male    DOB: 1956-10-10  Age: 61 y.o. MRN: [id number]  CC: The primary encounter diagnosis was Chronic bilateral low back pain without sciatica. Diagnoses of Weight gain, Essential hypertension, Hyperlipidemia LDL goal <100, and Spondylolisthesis at L5-S1 level were also pertinent to this visit.  HPI Tyrone Mcdowell presents for follow up on hypertension  Hyperlipidemia, obesity a nd tobacco abuse   Back pain has become intolerable again , persistently present for the last 3 months.  The slightest  Jar to the back cause pain that "shoots up my spine" ALL  THE WAY TO THE  NECK.  Reports that he cannot carry > 3 lbs in the right hand,  Causes back pain in the lower back.  .  Has not had any falls or unusual activity.  The pain is worse than the pain he was experiencing prior to  his back surgery May 2017 .  Only comfortable position is sitting with back extended or with a lumbar support   But can sit for 20 minutes only befoe he has to shift.    Overeating due to being miserable and in pain . Stopped drinking smoking and dipping.  Can't curb appetite.  Hasn't checked blood pressure lately,  On disability for years.  Has tried walking on treadmill for exercise but the activity  aggravated pain  Walking with a walkig stick  About a block before he has to trun back bc of pain  Took all blood pressure medications this morning       Lab Results  Component Value Date   CHOL 225 (H) 11/07/2017   HDL 43.50 11/07/2017   LDLCALC 145 (H) 11/07/2017   LDLDIRECT 142.8 10/23/2012   TRIG 183.0 (H) 11/07/2017   CHOLHDL 5 11/07/2017     Outpatient Medications Prior to Visit  Medication Sig Dispense Refill  . amLODipine (NORVASC) 10 MG tablet TAKE 1 TABLET BY MOUTH DAILY 90 tablet 1  . aspirin 81 MG tablet Take 81 mg by mouth daily.    . DULoxetine (CYMBALTA) 30 MG capsule TAKE 1 CAPSULE (30 MG TOTAL) BY MOUTH DAILY. INCREASE TO TWICE DAILY AFTER 2 WEEKS  60 capsule 3  . furosemide (LASIX) 20 MG tablet TAKE 1 TABLET BY MOUTH DAILY 90 tablet 2  . hydrochlorothiazide (HYDRODIURIL) 25 MG tablet Take 1 tablet (25 mg total) by mouth daily. 90 tablet 3  . sertraline (ZOLOFT) 100 MG tablet TAKE 1 TABLET BY MOUTH ONCE DAILY 90 tablet 0  . tizanidine (ZANAFLEX) 6 MG capsule TAKE 1 CAPSULE BY MOUTH 3 TIMES DAILY. 90 capsule 2  . traMADol (ULTRAM) 50 MG tablet Take 1 tablet (50 mg total) by mouth 2 (two) times daily. 60 tablet 5  . valsartan (DIOVAN) 160 MG tablet TAKE 1 TABLET (160 MG TOTAL) BY MOUTH DAILY. 90 tablet 0   No facility-administered medications prior to visit.     Review of Systems;  Patient denies headache, fevers, malaise, unintentional weight loss, skin rash, eye pain, sinus congestion and sinus pain, sore throat, dysphagia,  hemoptysis , cough, dyspnea, wheezing, chest pain, palpitations, orthopnea, edema, abdominal pain, nausea, melena, diarrhea, constipation, flank pain, dysuria, hematuria, urinary  Frequency, nocturia, numbness, tingling, seizures,  Focal weakness, Loss of consciousness,  Tremor, insomnia, depression, anxiety, and suicidal ideation.      Objective:  BP (!) 180/108   Pulse 64   Temp 97.7 F (36.5 C) (Oral)   Ht 5\' 9"  (1.753 m)  Wt 293 lb 3.2 oz (133 kg)   SpO2 98%   BMI 43.30 kg/m   BP Readings from Last 3 Encounters:  11/07/17 (!) 180/108  05/06/17 (!) 142/96  12/27/16 136/82    Wt Readings from Last 3 Encounters:  11/07/17 293 lb 3.2 oz (133 kg)  05/06/17 288 lb (130.6 kg)  12/27/16 297 lb (134.7 kg)    General appearance: alert, cooperative and appears stated age Ears: normal TM's and external ear canals both ears Throat: lips, mucosa, and tongue normal; teeth and gums normal Neck: no adenopathy, no carotid bruit, supple, symmetrical, trachea midline and thyroid not enlarged, symmetric, no tenderness/mass/nodules Back: symmetric, no curvature. ROM normal. No CVA tenderness. Lungs: clear to  auscultation bilaterally Heart: regular rate and rhythm, S1, S2 normal, no murmur, click, rub or gallop Abdomen: soft, non-tender; bowel sounds normal; no masses,  no organomegaly Pulses: 2+ and symmetric Skin: Skin color, texture, turgor normal. No rashes or lesions Lymph nodes: Cervical, supraclavicular, and axillary nodes normal.  Lab Results  Component Value Date   HGBA1C 5.9 11/07/2017    Lab Results  Component Value Date   CREATININE 0.90 11/07/2017   CREATININE 0.83 12/27/2016   CREATININE 0.65 04/14/2016    Lab Results  Component Value Date   WBC 4.2 04/14/2016   HGB 13.8 04/14/2016   HCT 42.3 04/14/2016   PLT 249 04/14/2016   GLUCOSE 100 (H) 11/07/2017   CHOL 225 (H) 11/07/2017   TRIG 183.0 (H) 11/07/2017   HDL 43.50 11/07/2017   LDLDIRECT 142.8 10/23/2012   LDLCALC 145 (H) 11/07/2017   ALT 19 11/07/2017   AST 19 11/07/2017   NA 140 11/07/2017   K 4.3 11/07/2017   CL 99 11/07/2017   CREATININE 0.90 11/07/2017   BUN 16 11/07/2017   CO2 32 11/07/2017   TSH 1.21 11/07/2017   PSA 0.32 12/27/2016   HGBA1C 5.9 11/07/2017   MICROALBUR <0.7 11/11/2015    US Abdomen Limited Ruq  Result Date: 01/11/2017 CLINICAL DATA:  61 year old male with new onset elevated liver enzymes. Initial encounter. EXAM: US ABDOMEN LIMITED - RIGHT UPPER QUADRANT COMPARISON:  None. FINDINGS: Gallbladder: No gallstones or wall thickening visualized. No sonographic Murphy sign noted by sonographer. Common bile duct: Diameter: 3.3 mm proximally. Mid to distal aspect not visualized secondary to bowel gas. Liver: Diffuse increased echogenicity consistent with fatty infiltration. No obvious mass or intrahepatic biliary duct dilation. IMPRESSION: Fatty liver. Electronically Signed   By: Genia Del M.D.   On: 01/11/2017 08:43    Assessment & Plan:   Problem List Items Addressed This Visit    Hyperlipidemia LDL goal <100    Based on his untreated lipid profile, the risk of clinically  significant CAD is 16% over the next 10 years, using the Framingham risk calculator.  He has again deferred treatment.   Lab Results  Component Value Date   CHOL 225 (H) 11/07/2017   HDL 43.50 11/07/2017   LDLCALC 145 (H) 11/07/2017   LDLDIRECT 142.8 10/23/2012   TRIG 183.0 (H) 11/07/2017   CHOLHDL 5 11/07/2017   Lab Results  Component Value Date   ALT 19 11/07/2017   AST 19 11/07/2017   ALKPHOS 73 11/07/2017   BILITOT 0.5 11/07/2017         Relevant Orders   Lipid panel (Completed)   Hypertension    Uncontrolled, secondary to pain . maxed out on amlodipine, valsartan , and hctz. . he reports compliance with medication regimen .  He has been asked to check his BP at work and  submit readings for evaluation. Renal function will be checked today      Relevant Orders   Comprehensive metabolic panel (Completed)   Spondylolisthesis at L5-S1 level    S/p lumbar decompression May 2017.  He has weaned off narcotics and tramadol but has had an increase in pain that now radiates up his spine despite addingcymbalta and maximal use of 2 tramadol daily  . MRI lumbar spine ordered along with referral to Dr Sharlet Salina.  Prednisone taper and gabapentin trial        Other Visit Diagnoses    Chronic bilateral low back pain without sciatica    -  Primary   Relevant Medications   predniSONE (DELTASONE) 10 MG tablet   Other Relevant Orders   MR Lumbar Spine Wo Contrast   Ambulatory referral to Sports Medicine   Weight gain       Relevant Orders   Hemoglobin A1c (Completed)   TSH (Completed)      I am having Verlin Dike start on predniSONE and gabapentin. I am also having him maintain his aspirin, sertraline, traMADol, hydrochlorothiazide, furosemide, valsartan, amLODipine, tizanidine, and DULoxetine.  Meds ordered this encounter  Medications  . predniSONE (DELTASONE) 10 MG tablet    Sig: 6 tabletsdaily for 3 days,  then reduce by 1 tablet daily until gone    Dispense:  33 tablet     Refill:  0  . gabapentin (NEURONTIN) 100 MG capsule    Sig: Take 1 capsule (100 mg total) by mouth 3 (three) times daily.    Dispense:  90 capsule    Refill:  2    There are no discontinued medications.  Follow-up: No Follow-up on file.   [name], MD

## 2017-11-09 ENCOUNTER — Ambulatory Visit
Admission: RE | Admit: 2017-11-09 | Discharge: 2017-11-09 | Disposition: A | Discharge status: Home / Self Care | Payer: 59 | Source: Ambulatory Visit | Attending: Internal Medicine | Admitting: Internal Medicine

## 2017-11-09 ENCOUNTER — Other Ambulatory Visit: Payer: Self-pay | Admitting: Internal Medicine

## 2017-11-09 DIAGNOSIS — G8929 Other chronic pain: Secondary | ICD-10-CM | POA: Diagnosis not present

## 2017-11-09 DIAGNOSIS — M545 Low back pain: Secondary | ICD-10-CM | POA: Insufficient documentation

## 2017-11-09 DIAGNOSIS — M47896 Other spondylosis, lumbar region: Secondary | ICD-10-CM | POA: Diagnosis not present

## 2017-11-09 MED ORDER — LIRAGLUTIDE -WEIGHT MANAGEMENT 18 MG/3ML ~~LOC~~ SOPN: 0.6000 mg | PEN_INJECTOR | Freq: Every day | SUBCUTANEOUS | 0 refills | Status: DC

## 2017-11-09 MED ORDER — PEN NEEDLES 31G X 6 MM MISC: 0 refills | Status: DC

## 2017-11-10 ENCOUNTER — Encounter: Payer: Self-pay | Admitting: Internal Medicine

## 2017-12-01 ENCOUNTER — Telehealth: Payer: Self-pay | Admitting: Internal Medicine

## 2017-12-01 NOTE — Telephone Encounter (Signed)
Copied from [place]. Topic: Quick Communication - Rx Refill/Question >> Dec 01, 2017 11:09 AM Marin Olp L wrote: Has the patient contacted their pharmacy? Yes.   (Agent: If no, request that the patient contact the pharmacy for the refill.) Preferred Pharmacy (with phone number or street name): [place], [place]: Please be advised that RX refills may take up to 3 business days. We ask that you follow-up with your pharmacy. Pharm requested refill for Saxinda but Cover My Meds needs a precertification completed for it.

## 2017-12-01 NOTE — Telephone Encounter (Signed)
Cover My Meds needs  precertification completed for Saxinda.

## 2017-12-04 ENCOUNTER — Encounter: Payer: Self-pay | Admitting: Internal Medicine

## 2017-12-05 ENCOUNTER — Telehealth: Payer: Self-pay | Admitting: Internal Medicine

## 2017-12-05 ENCOUNTER — Other Ambulatory Visit: Payer: Self-pay | Admitting: Internal Medicine

## 2017-12-05 MED ORDER — GABAPENTIN 100 MG PO CAPS: 200.0000 mg | ORAL_CAPSULE | Freq: Three times a day (TID) | ORAL | 2 refills | Status: DC

## 2017-12-05 NOTE — Telephone Encounter (Signed)
Copied from [place]. Topic: Quick Communication - Rx Refill/Question >> Dec 01, 2017 11:09 AM Marin Olp L wrote: Has the patient contacted their pharmacy? Yes.   (Agent: If no, request that the patient contact the pharmacy for the refill.) Preferred Pharmacy (with phone number or street name): [place], [place]: Please be advised that RX refills may take up to 3 business days. We ask that you follow-up with your pharmacy. Pharm requested refill for Saxinda but Cover My Meds needs a precertification completed for it. >> Dec 05, 2017 11:56 AM Cleaster Corin, NT wrote: Jerene Pitch calling back from Cover my meds to get pre authozation for med. saxinda [phone]

## 2017-12-05 NOTE — Telephone Encounter (Signed)
Please advise 

## 2017-12-08 NOTE — Telephone Encounter (Signed)
PA has been submitted on covermymeds.  

## 2017-12-08 NOTE — Telephone Encounter (Signed)
PA for saxenda has been submitted on covermymeds.  

## 2017-12-12 ENCOUNTER — Other Ambulatory Visit: Payer: Self-pay | Admitting: Internal Medicine

## 2017-12-13 ENCOUNTER — Ambulatory Visit (INDEPENDENT_AMBULATORY_CARE_PROVIDER_SITE_OTHER): Payer: Self-pay | Admitting: Nurse Practitioner

## 2017-12-13 ENCOUNTER — Encounter: Payer: Self-pay | Admitting: Internal Medicine

## 2017-12-13 VITALS — BP 100/70 | HR 84 | Temp 98.7°F | Wt 290.0 lb

## 2017-12-13 DIAGNOSIS — R6889 Other general symptoms and signs: Secondary | ICD-10-CM

## 2017-12-13 LAB — POCT INFLUENZA A/B
INFLUENZA A, POC: NEGATIVE
INFLUENZA B, POC: NEGATIVE

## 2017-12-13 NOTE — Telephone Encounter (Signed)
Patient having body aches running fever, cough congestion, chills sweats, no nausea or vomiting. No appointments available patient is going to [employer] this morning. FYI

## 2017-12-13 NOTE — Progress Notes (Signed)
Subjective:  Tyrone Mcdowell is a 62 y.o. male who presents for evaluation of influenza like symptoms.  Symptoms include chills and fever: suspected fevers but not measured at home.  Onset of symptoms was 1 day ago, and has been gradually improving since that time.  Treatment to date:  acetaminophen and decongestants.  High risk factors for influenza complications:  co-morbid illness.  Patient did receive his flu shot in October or November per his spouse.    The following portions of the patient's history were reviewed and updated as appropriate:  allergies, current medications and past medical history.  Constitutional: positive for chills, fatigue, fevers and sweats, negative for night sweats Eyes: negative Ears, nose, mouth, throat, and face: positive for nasal congestion and sore throat, negative for ear drainage and earaches Respiratory: positive for cough Cardiovascular: negative Gastrointestinal: positive for nausea, negative for abdominal pain, constipation, diarrhea and vomiting Neurological: positive for headaches Behavioral/Psych: negative Objective:  BP 100/70   Pulse 84   Temp 98.7 F (37.1 C)   Wt 290 lb (131.5 kg)   BMI 42.83 kg/m  General appearance: alert, cooperative and fatigued Head: Normocephalic, without obvious abnormality, atraumatic Eyes: conjunctivae/corneas clear. PERRL, EOM's intact. Fundi benign. Ears: normal TM's and external ear canals both ears Nose: Nares normal. Septum midline. Mucosa normal. No drainage or sinus tenderness., no sinus tenderness Throat: lips, mucosa, and tongue normal; teeth and gums normal Lungs: clear to auscultation bilaterally Heart: regular rate and rhythm, S1, S2 normal, no murmur, click, rub or gallop Abdomen: soft, non-tender; bowel sounds normal; no masses,  no organomegaly Pulses: 2+ and symmetric Skin: Skin color, texture, turgor normal. No rashes or lesions Lymph nodes: cervical and submandibular nodes normal Neurologic:  Grossly normal    Assessment:  influenza like syndrome    Plan:  Discussed diagnosis and treatment of influenza. Discussed the importance of avoiding unnecessary antibiotic therapy. Educational material distributed and questions answered. Suggested symptomatic OTC remedies. Supportive care with appropriate antipyretics and fluids. Nasal saline spray for congestion. Call in 3-4  days if symptoms aren't resolving.

## 2017-12-19 ENCOUNTER — Encounter: Payer: Self-pay | Admitting: Internal Medicine

## 2017-12-19 ENCOUNTER — Telehealth: Payer: Self-pay | Admitting: Internal Medicine

## 2017-12-19 NOTE — Telephone Encounter (Signed)
Copied from [place] [phone]. Topic: General - Other >> Dec 19, 2017  9:56 AM Yvette Rack wrote: Reason for CRM: Elonda Husky from [employer] [employer] [phone] was calling stating that Liraglutide -Weight Management ([place]) 18 MG/3ML SOPN  needed prior authorization they had faxed over an appeal Information on the 9th and 11th of January

## 2017-12-19 NOTE — Telephone Encounter (Signed)
Please advise 

## 2017-12-20 ENCOUNTER — Telehealth: Payer: Self-pay | Admitting: Internal Medicine

## 2017-12-20 ENCOUNTER — Other Ambulatory Visit: Payer: Self-pay | Admitting: *Deleted

## 2017-12-20 MED ORDER — GABAPENTIN 100 MG PO CAPS: 200.0000 mg | ORAL_CAPSULE | Freq: Three times a day (TID) | ORAL | 2 refills | Status: DC

## 2017-12-20 NOTE — Telephone Encounter (Signed)
Copied from [place]. Topic: Quick Communication - Rx Refill/Question >> Dec 20, 2017 11:09 AM Ether Griffins B wrote: Medication: Gabapentin   Has the patient contacted their pharmacy? Yes.     (Agent: If no, request that the patient contact the pharmacy for the refill.)   Preferred Pharmacy (with phone number or street name): [place]  Pharmacy sent request and hasnt received it back. Class shows print on 12/05/17   Agent: Please be advised that RX refills may take up to 3 business days. We ask that you follow-up with your pharmacy.

## 2017-12-20 NOTE — Telephone Encounter (Signed)
Appeal has been submitted on covermymeds.

## 2017-12-22 ENCOUNTER — Encounter: Payer: Self-pay | Admitting: Internal Medicine

## 2017-12-22 ENCOUNTER — Other Ambulatory Visit: Payer: Self-pay | Admitting: *Deleted

## 2017-12-22 MED ORDER — GABAPENTIN 100 MG PO CAPS: 200.0000 mg | ORAL_CAPSULE | Freq: Three times a day (TID) | ORAL | 2 refills | Status: DC

## 2017-12-22 NOTE — Telephone Encounter (Signed)
Spoke to pt, asked him what pharmacy Gabapentin was suppose to go to? It went to [facility]. Pt said it should go to [facility]. Told pt okay I will resend Rx to correct pharmacy. Pt verbalized understanding. Rx sent.

## 2018-01-05 ENCOUNTER — Other Ambulatory Visit: Payer: Self-pay | Admitting: Internal Medicine

## 2018-01-20 ENCOUNTER — Other Ambulatory Visit: Payer: Self-pay | Admitting: Internal Medicine

## 2018-02-06 ENCOUNTER — Other Ambulatory Visit: Payer: Self-pay | Admitting: Internal Medicine

## 2018-03-21 ENCOUNTER — Encounter: Payer: Self-pay | Admitting: Internal Medicine

## 2018-03-21 ENCOUNTER — Other Ambulatory Visit: Payer: Self-pay | Admitting: Internal Medicine

## 2018-03-23 ENCOUNTER — Encounter: Payer: Self-pay | Admitting: Nurse Practitioner

## 2018-03-23 NOTE — Telephone Encounter (Signed)
FYI.  I spoke to pharmacist about early refill.  He received #60 on 03/06/18.  Pt reported taking tid on some occasions.  Will decrease back to bid.  I ok'd #10 tablets to get him through until Monday.  I was not sure how you wanted to proceed with further refills.

## 2018-03-23 NOTE — Telephone Encounter (Signed)
Refilled: 11/08/2017 Last OV: 11/07/2017 Next OV: not scheduled

## 2018-03-29 MED ORDER — TRAMADOL HCL 50 MG PO TABS: 50.0000 mg | ORAL_TABLET | Freq: Four times a day (QID) | ORAL | 0 refills | Status: DC | PRN

## 2018-04-17 ENCOUNTER — Encounter: Payer: Self-pay | Admitting: Internal Medicine

## 2018-05-02 ENCOUNTER — Encounter: Payer: Self-pay | Admitting: Internal Medicine

## 2018-05-02 ENCOUNTER — Other Ambulatory Visit: Payer: Self-pay | Admitting: Internal Medicine

## 2018-05-02 ENCOUNTER — Telehealth: Payer: Self-pay | Admitting: Internal Medicine

## 2018-05-02 MED ORDER — TRAMADOL HCL 50 MG PO TABS: 50.0000 mg | ORAL_TABLET | Freq: Four times a day (QID) | ORAL | 1 refills | Status: DC | PRN

## 2018-05-02 NOTE — Telephone Encounter (Signed)
Please  print tramadol rx

## 2018-05-03 ENCOUNTER — Other Ambulatory Visit: Payer: Self-pay | Admitting: Internal Medicine

## 2018-05-03 NOTE — Telephone Encounter (Signed)
Printed, signed and faxed.  

## 2018-05-08 ENCOUNTER — Other Ambulatory Visit: Payer: Self-pay

## 2018-05-08 ENCOUNTER — Emergency Department: Payer: 59

## 2018-05-08 ENCOUNTER — Inpatient Hospital Stay (HOSPITAL_COMMUNITY)
Admission: AD | Admit: 2018-05-08 | Discharge: 2018-05-10 | DRG: 069 | Disposition: A | Discharge status: Home / Self Care | Payer: 59 | Source: Other Acute Inpatient Hospital | Attending: Internal Medicine | Admitting: Internal Medicine

## 2018-05-08 ENCOUNTER — Encounter (HOSPITAL_COMMUNITY): Payer: Self-pay | Admitting: Internal Medicine

## 2018-05-08 ENCOUNTER — Encounter: Payer: Self-pay | Admitting: Emergency Medicine

## 2018-05-08 ENCOUNTER — Emergency Department
Admission: EM | Admit: 2018-05-08 | Discharge: 2018-05-08 | Disposition: A | Discharge status: Other Acute Inpatient Hospital | Payer: 59 | Attending: Emergency Medicine | Admitting: Emergency Medicine

## 2018-05-08 ENCOUNTER — Inpatient Hospital Stay (HOSPITAL_COMMUNITY): Payer: 59

## 2018-05-08 DIAGNOSIS — R350 Frequency of micturition: Secondary | ICD-10-CM | POA: Diagnosis present

## 2018-05-08 DIAGNOSIS — R072 Precordial pain: Secondary | ICD-10-CM | POA: Diagnosis not present

## 2018-05-08 DIAGNOSIS — I6503 Occlusion and stenosis of bilateral vertebral arteries: Secondary | ICD-10-CM | POA: Diagnosis not present

## 2018-05-08 DIAGNOSIS — R270 Ataxia, unspecified: Secondary | ICD-10-CM | POA: Diagnosis present

## 2018-05-08 DIAGNOSIS — E6609 Other obesity due to excess calories: Secondary | ICD-10-CM

## 2018-05-08 DIAGNOSIS — F329 Major depressive disorder, single episode, unspecified: Secondary | ICD-10-CM | POA: Diagnosis present

## 2018-05-08 DIAGNOSIS — Z8249 Family history of ischemic heart disease and other diseases of the circulatory system: Secondary | ICD-10-CM

## 2018-05-08 DIAGNOSIS — Z79899 Other long term (current) drug therapy: Secondary | ICD-10-CM | POA: Insufficient documentation

## 2018-05-08 DIAGNOSIS — E785 Hyperlipidemia, unspecified: Secondary | ICD-10-CM | POA: Diagnosis present

## 2018-05-08 DIAGNOSIS — Z79891 Long term (current) use of opiate analgesic: Secondary | ICD-10-CM | POA: Diagnosis not present

## 2018-05-08 DIAGNOSIS — Z6839 Body mass index (BMI) 39.0-39.9, adult: Secondary | ICD-10-CM | POA: Diagnosis not present

## 2018-05-08 DIAGNOSIS — I7774 Dissection of vertebral artery: Secondary | ICD-10-CM | POA: Diagnosis not present

## 2018-05-08 DIAGNOSIS — I639 Cerebral infarction, unspecified: Secondary | ICD-10-CM | POA: Diagnosis not present

## 2018-05-08 DIAGNOSIS — Z823 Family history of stroke: Secondary | ICD-10-CM | POA: Diagnosis not present

## 2018-05-08 DIAGNOSIS — I10 Essential (primary) hypertension: Secondary | ICD-10-CM | POA: Insufficient documentation

## 2018-05-08 DIAGNOSIS — R0789 Other chest pain: Secondary | ICD-10-CM | POA: Diagnosis present

## 2018-05-08 DIAGNOSIS — R51 Headache: Secondary | ICD-10-CM | POA: Diagnosis not present

## 2018-05-08 DIAGNOSIS — Z801 Family history of malignant neoplasm of trachea, bronchus and lung: Secondary | ICD-10-CM | POA: Diagnosis not present

## 2018-05-08 DIAGNOSIS — Z720 Tobacco use: Secondary | ICD-10-CM

## 2018-05-08 DIAGNOSIS — R55 Syncope and collapse: Secondary | ICD-10-CM | POA: Diagnosis present

## 2018-05-08 DIAGNOSIS — Z7982 Long term (current) use of aspirin: Secondary | ICD-10-CM | POA: Insufficient documentation

## 2018-05-08 DIAGNOSIS — E669 Obesity, unspecified: Secondary | ICD-10-CM | POA: Diagnosis present

## 2018-05-08 DIAGNOSIS — G8929 Other chronic pain: Secondary | ICD-10-CM | POA: Diagnosis present

## 2018-05-08 DIAGNOSIS — I679 Cerebrovascular disease, unspecified: Secondary | ICD-10-CM | POA: Diagnosis present

## 2018-05-08 DIAGNOSIS — G459 Transient cerebral ischemic attack, unspecified: Secondary | ICD-10-CM | POA: Diagnosis not present

## 2018-05-08 DIAGNOSIS — R42 Dizziness and giddiness: Secondary | ICD-10-CM | POA: Diagnosis not present

## 2018-05-08 DIAGNOSIS — Z716 Tobacco abuse counseling: Secondary | ICD-10-CM

## 2018-05-08 DIAGNOSIS — F17220 Nicotine dependence, chewing tobacco, uncomplicated: Secondary | ICD-10-CM | POA: Diagnosis not present

## 2018-05-08 DIAGNOSIS — Z794 Long term (current) use of insulin: Secondary | ICD-10-CM | POA: Diagnosis not present

## 2018-05-08 DIAGNOSIS — R200 Anesthesia of skin: Secondary | ICD-10-CM | POA: Diagnosis not present

## 2018-05-08 DIAGNOSIS — Z8673 Personal history of transient ischemic attack (TIA), and cerebral infarction without residual deficits: Secondary | ICD-10-CM | POA: Diagnosis not present

## 2018-05-08 DIAGNOSIS — F419 Anxiety disorder, unspecified: Secondary | ICD-10-CM | POA: Diagnosis present

## 2018-05-08 LAB — URINE DRUG SCREEN, QUALITATIVE (ARMC ONLY)
AMPHETAMINES, UR SCREEN: NOT DETECTED
BENZODIAZEPINE, UR SCRN: NOT DETECTED
Barbiturates, Ur Screen: NOT DETECTED
CANNABINOID 50 NG, UR ~~LOC~~: NOT DETECTED
Cocaine Metabolite,Ur ~~LOC~~: NOT DETECTED
MDMA (ECSTASY) UR SCREEN: NOT DETECTED
Methadone Scn, Ur: NOT DETECTED
Opiate, Ur Screen: NOT DETECTED
Phencyclidine (PCP) Ur S: NOT DETECTED
TRICYCLIC, UR SCREEN: NOT DETECTED

## 2018-05-08 LAB — DIFFERENTIAL
BASOS PCT: 1 %
Basophils Absolute: 0 10*3/uL (ref 0–0.1)
EOS PCT: 3 %
Eosinophils Absolute: 0.2 10*3/uL (ref 0–0.7)
Lymphocytes Relative: 30 %
Lymphs Abs: 2 10*3/uL (ref 1.0–3.6)
Monocytes Absolute: 0.7 10*3/uL (ref 0.2–1.0)
Monocytes Relative: 11 %
NEUTROS PCT: 55 %
Neutro Abs: 3.7 10*3/uL (ref 1.4–6.5)

## 2018-05-08 LAB — CBC
HCT: 40.5 % (ref 40.0–52.0)
Hemoglobin: 13.6 g/dL (ref 13.0–18.0)
MCH: 30.3 pg (ref 26.0–34.0)
MCHC: 33.7 g/dL (ref 32.0–36.0)
MCV: 89.9 fL (ref 80.0–100.0)
PLATELETS: 325 10*3/uL (ref 150–440)
RBC: 4.5 MIL/uL (ref 4.40–5.90)
RDW: 13.7 % (ref 11.5–14.5)
WBC: 6.9 10*3/uL (ref 3.8–10.6)

## 2018-05-08 LAB — BASIC METABOLIC PANEL
ANION GAP: 9 (ref 5–15)
BUN: 18 mg/dL (ref 6–20)
CHLORIDE: 98 mmol/L — AB (ref 101–111)
CO2: 30 mmol/L (ref 22–32)
Calcium: 9.6 mg/dL (ref 8.9–10.3)
Creatinine, Ser: 1 mg/dL (ref 0.61–1.24)
GFR calc Af Amer: >60 mL/min (ref >60)
GLUCOSE: 90 mg/dL (ref 65–99)
POTASSIUM: 3.5 mmol/L (ref 3.5–5.1)
Sodium: 137 mmol/L (ref 135–145)

## 2018-05-08 LAB — URINALYSIS, COMPLETE (UACMP) WITH MICROSCOPIC
Bacteria, UA: NONE SEEN
Bilirubin Urine: NEGATIVE
GLUCOSE, UA: NEGATIVE mg/dL
HGB URINE DIPSTICK: NEGATIVE
Ketones, ur: NEGATIVE mg/dL
Leukocytes, UA: NEGATIVE
Nitrite: NEGATIVE
Protein, ur: NEGATIVE mg/dL
SPECIFIC GRAVITY, URINE: 1.002 — AB (ref 1.005–1.030)
Squamous Epithelial / LPF: NONE SEEN (ref 0–5)
WBC, UA: NONE SEEN WBC/hpf (ref 0–5)
pH: 6 (ref 5.0–8.0)

## 2018-05-08 LAB — APTT: APTT: 40 s — AB (ref 24–36)

## 2018-05-08 LAB — TROPONIN I: Troponin I: <0.03 ng/mL (ref <0.03)

## 2018-05-08 LAB — GLUCOSE, CAPILLARY: GLUCOSE-CAPILLARY: 80 mg/dL (ref 65–99)

## 2018-05-08 LAB — PROTIME-INR
INR: 1.07
PROTHROMBIN TIME: 13.8 s (ref 11.4–15.2)

## 2018-05-08 MED ORDER — ASPIRIN 81 MG PO CHEW
648.0000 mg | CHEWABLE_TABLET | Freq: Once | ORAL | Status: AC
Start: 2018-05-08 — End: 2018-05-08
  Administered 2018-05-08: 648 mg via ORAL
  Filled 2018-05-08: qty 8

## 2018-05-08 MED ORDER — STROKE: EARLY STAGES OF RECOVERY BOOK
Freq: Once | Status: AC
  Administered 2018-05-08: 22:00:00
  Filled 2018-05-08: qty 1

## 2018-05-08 MED ORDER — SODIUM CHLORIDE 0.9 % IV SOLN
INTRAVENOUS | Status: DC
  Administered 2018-05-09: 02:00:00 via INTRAVENOUS

## 2018-05-08 MED ORDER — IOPAMIDOL (ISOVUE-370) INJECTION 76%
75.0000 mL | Freq: Once | INTRAVENOUS | Status: AC | PRN
  Administered 2018-05-08: 75 mL via INTRAVENOUS

## 2018-05-08 MED ORDER — SENNOSIDES-DOCUSATE SODIUM 8.6-50 MG PO TABS: 1.0000 | ORAL_TABLET | Freq: Every evening | ORAL | Status: DC | PRN

## 2018-05-08 MED ORDER — ACETAMINOPHEN 325 MG PO TABS: 650.0000 mg | ORAL_TABLET | ORAL | Status: DC | PRN

## 2018-05-08 MED ORDER — ACETAMINOPHEN 160 MG/5ML PO SOLN: 650.0000 mg | ORAL | Status: DC | PRN

## 2018-05-08 MED ORDER — ASPIRIN 325 MG PO TABS
325.0000 mg | ORAL_TABLET | Freq: Every day | ORAL | Status: DC
  Administered 2018-05-09 – 2018-05-10 (×2): 325 mg via ORAL
  Filled 2018-05-08 (×2): qty 1

## 2018-05-08 MED ORDER — ACETAMINOPHEN 650 MG RE SUPP: 650.0000 mg | RECTAL | Status: DC | PRN

## 2018-05-08 NOTE — ED Provider Notes (Signed)
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 results.  At this point, we are trying to determine if the patient is best served at [facility] or with transfer to [facility] or another facility.  Updated patient and wife. Patient asymptomatic at this time.  Explained to the best of my ability the results, and asked him to stay NPO until we have a better disposition plan.  CT Angio Neck W and/or Wo Contrast [CF]  1628 Spoke with Dr. Lilia Pro, the teleneurologist who evaluated the patient.  He reviewed the CTAs.  He agrees with the concern for bilateral vertebral artery occlusion versus dissection particularly given that the patient clearly has been having recurrent syncopal or near syncopal episodes with some paresthesia that just resolved as described above.  He recommended transfer to a facility such as [facility] that has the ability to do diagnostic angiogram  with potential intervention rather than keeping him at [facility] for specialized MRI where we would not be able to intervene even if appropriate  findings were discovered on the imaging.  I have asked my secretary to call [facility] neurology so that I can discuss transfer.   [CF]  1648 I spoke by phone with Dr. Cheral Marker with [facility] neurology.  We discussed the case and he reviewed the images and he agrees that vertebral artery dissection is possible.  He agrees that the patient should be at [facility] for diagnostic angiogram.  He recommended proceeding with the stroke swallow screen, administering a loading dose of aspirin 650 mg by mouth, and then admitting the patient to the hospitalist service on any telemetry bed that is available.  He recommended against heparin or any other medical intervention at this time.  I will update the patient and his wife and we are currently awaiting callback by the hospitalist.   [CF]  1657 I spoke by phone with Dr. Gloriann Loan with the hospitalist service at [facility].  We discussed the case and she agreed to accept the patient as a transfer.  [facility] will work with Mining engineer to determine the best method of transportation but he has no special requirements and is hemodynamically stable.  He remains asymptomatic with an NIH stroke scale is 0 at this time.  I updated him about the plan and he agrees.   [CF]  2248 Still awaiting telemetry bed assignment at [facility].  Luanne is checking with them again to see if a bed is available.  Updated patient and his wife.  UDS is normal and negative, and troponin is negative.  Coag studies are back as well.     [CF]  1901 A bed has been assigned and EMS is here for transport.  I updated the patient and his wife.  He remained stable and in no acute distress.  I updated the EMTALA documentation.   [CF]    Clinical Course User Index [CF] Hinda Kehr, MD    ____________________________________________  FINAL CLINICAL IMPRESSION(S) / ED DIAGNOSES  Final diagnoses:  TIA (transient ischemic attack)  Vertebral artery dissection ([place])     MEDICATIONS GIVEN DURING  THIS VISIT:  Medications  iopamidol (ISOVUE-370) 76 % injection 75 mL (75 mLs Intravenous Contrast Given 05/08/18 1545)  aspirin chewable tablet 648 mg (648 mg Oral Given 05/08/18 1654)     ED Discharge Orders    None       Note:  This document was prepared using Dragon voice recognition software and may include unintentional dictation errors.    Hinda Kehr, MD 05/08/18 1902

## 2018-05-08 NOTE — ED Triage Notes (Signed)
Yesterday had syncopal episode in driveway.  Go up after awhile and was nauseated. Broke out in sweat.   Today had near syncopal episode about 2 hours ago.  When that occurred he started having left sided facial numbness.

## 2018-05-08 NOTE — H&P (Signed)
Tyrone Mcdowell NWG:[id number] DOB: 02/09/1956 DOA: 05/08/2018     PCP: [name], MD   Outpatient Specialists:  NS Dr. Annette Stable   Patient arrived to ER on  at   Patient coming from:  home Lives  With family   Chief Complaint: No chief complaint on file.   HPI: Tyrone Mcdowell is a 62 y.o. male with medical history significant of HTN, HLD, chronic pain    Presented with   Episodes of dizziness vertigo and ataxia on and off for 1 week with left side facial numblness.  And left hand weakness no headache, no slurred speech. But at times have to look for words. no prior hx of CVA. Now numbness have improved.  Regarding pertinent Chronic problems: Hypertension poorly controlled on multiple medications no history of diabetes or CAD    While in ER:  Initially presented to [place] was done which showed possible dissection versus occlusion neurology was consulted who recommended transfer to [facility] Following Medications were ordered in ER: Medications   stroke: mapping our early stages of recovery book (has no administration in time range)  0.9 %  sodium chloride infusion (has no administration in time range)  acetaminophen (TYLENOL) tablet 650 mg (has no administration in time range)    Or  acetaminophen (TYLENOL) solution 650 mg (has no administration in time range)    Or  acetaminophen (TYLENOL) suppository 650 mg (has no administration in time range)  senna-docusate (Senokot-S) tablet 1 tablet (has no administration in time range)    Significant initial  Findings: Abnormal Labs Reviewed - No abnormal labs to display     Cr  stable,   Lab Results  Component Value Date   CREATININE 1.00 05/08/2018   CREATININE 0.90 11/07/2017   CREATININE 0.83 12/27/2016      WBC 6.9  HG/HCT stable,    Component Value Date/Time   HGB 13.6 05/08/2018 1430   HGB 14.3 05/03/2012 2101   HCT 40.5 05/08/2018 1430   HCT 43.5 05/03/2012 2101     Fever head  showed remote lacunar infarct atrophy white matter advanced for age  ECG:  Personally reviewed by me showing: HR : 63 Rhythm:NSR, possible LVH no evidence of ischemic changes QTC 423   ED Triage Vitals  Enc Vitals Group     BP      Pulse      Resp      Temp      Temp src      SpO2      Weight      Height      Head Circumference      Peak Flow      Pain Score      Pain Loc      Pain Edu?      Excl. in [place]?   VHQI(69)@       Latest  There were no vitals taken for this visit.      ER Provider Called:   Neurology Dr. Charna Archer They Recommend transfer to [facility] was called for admission for possible CVA   Review of Systems:    Pertinent positives include:  gait abnormality, ataxia and numbness left-sided weakness Word finding difficulty Constitutional:  No weight loss, night sweats, Fevers, chills, fatigue, weight loss  HEENT:  No headaches, Difficulty swallowing,Tooth/dental problems,Sore throat,  No sneezing, itching, ear ache, nasal congestion, post nasal drip,  Cardio-vascular:  No chest pain, Orthopnea, PND,  anasarca, dizziness, palpitations.no Bilateral lower extremity swelling  GI:  No heartburn, indigestion, abdominal pain, nausea, vomiting, diarrhea, change in bowel habits, loss of appetite, melena, blood in stool, hematemesis Resp:  no shortness of breath at rest. No dyspnea on exertion, No excess mucus, no productive cough, No non-productive cough, No coughing up of blood.No change in color of mucus.No wheezing. Skin:  no rash or lesions. No jaundice GU:  no dysuria, change in color of urine, no urgency or frequency. No straining to urinate.  No flank pain.  Musculoskeletal:  No joint pain or no joint swelling. No decreased range of motion. No back pain.  Psych:  No change in mood or affect. No depression or anxiety. No memory loss.  Neuro:  no double vision, no  no slurred speech, no confusion  As per HPI otherwise 10 point review  of systems negative.   Past Medical History:   Past Medical History:  Diagnosis Date  . Anxiety    takes Valium as needed  . Arthritis   . Chronic back pain    spondylolisthesis  . Depression    takes Zoloft daily  . Hyperlipidemia    takes Atorvastatin daily  . Hypertension    takes Micardis and AMlodipine daily  . Joint pain   . Joint swelling   . Peripheral edema    takes Lasix daily  . Urinary frequency    takes Rapaflo daily  . Weakness    numbness and tingling      Past Surgical History:  Procedure Laterality Date  . BACK SURGERY    . COLONOSCOPY    . TIBIA FRACTURE SURGERY Bilateral 1989   multiple surgeries   . TIBIA HARDWARE REMOVAL  1989    Social History:  Ambulatory independently     reports that he has quit smoking. His smoking use included cigarettes. He smoked 0.00 packs per day. His smokeless tobacco use includes chew. He reports that he does not drink alcohol or use drugs.     Family History:  Family History  Problem Relation Age of Onset  . Cancer Mother        lung Ca, tobacco abuse  . Stroke Father   . Hypertension Father   . Heart disease Father        pacemaker, heart failure  . Kidney disease Father   . Ulcerative colitis Father   . Cancer Sister 42       colon CA  . Cancer Other        colon CA at age 2  . Ulcerative colitis Brother     Allergies: No Known Allergies   Prior to Admission medications   Medication Sig Start Date End Date Taking? Authorizing Provider  amLODipine (NORVASC) 10 MG tablet TAKE 1 TABLET BY MOUTH DAILY 02/06/18   [name], MD  aspirin 81 MG tablet Take 81 mg by mouth daily.    [provider]  DULoxetine (CYMBALTA) 30 MG capsule TAKE 1 CAPSULE (30 MG TOTAL) BY MOUTH DAILY. INCREASE TO TWICE DAILY AFTER 2 WEEKS 01/20/18   [name], MD  furosemide (LASIX) 20 MG tablet TAKE 1 TABLET BY MOUTH DAILY 07/15/17   [name], MD  gabapentin (NEURONTIN) 100 MG capsule TAKE 2  CAPSULES BY MOUTH 3 TIMES DAILY. 03/22/18   [name], MD  hydrochlorothiazide (HYDRODIURIL) 25 MG tablet TAKE 1 TABLET (25 MG TOTAL) BY MOUTH DAILY. 05/04/18   [name], MD  Insulin Pen Needle (  PEN NEEDLES) 31G X 6 MM MISC For use with victoza /saxenda Patient not taking: Reported on 12/13/2017 11/09/17   [name], MD  Liraglutide -Weight Management (SAXENDA) 18 MG/3ML SOPN Inject 0.6 mg into the skin daily. Increase dose weekly as follows: Week 2: 1.2 mg daily ; Week 3: 1.8 mg daily; Week 4: 2.4 mg daily Patient not taking: Reported on 12/13/2017 11/09/17   [name], MD  predniSONE (DELTASONE) 10 MG tablet 6 tabletsdaily for 3 days,  then reduce by 1 tablet daily until gone Patient not taking: Reported on 12/13/2017 11/07/17   [name], MD  sertraline (ZOLOFT) 100 MG tablet TAKE 1 TABLET BY MOUTH ONCE DAILY Patient not taking: Reported on 12/13/2017 04/15/17   [name], MD  tizanidine (ZANAFLEX) 6 MG capsule TAKE 1 CAPSULE BY MOUTH 3 TIMES DAILY. 01/09/18   [name], MD  traMADol (ULTRAM) 50 MG tablet Take 1 tablet (50 mg total) by mouth every 6 (six) hours as needed. For chronic back and leg pain 05/02/18   [name], MD  valsartan (DIOVAN) 160 MG tablet TAKE 1 TABLET BY MOUTH DAILY 02/06/18   [name], MD   Physical Exam: There were no vitals taken for this visit. 1. General:  in No Acute distress well -appearing 2. Psychological: Alert and  Oriented 3. Head/ENT:    Dry Mucous Membranes                          Head Non traumatic, neck supple                          Normal Dentition 4. SKIN: decreased Skin turgor,  Skin clean Dry and intact no rash 5. Heart: Regular rate and rhythm no Murmur, no Rub or gallop 6. Lungs:Clear to auscultation bilaterally, no wheezes or crackles   7. Abdomen: Soft,non-tender, Non distended  obese bowel sounds present 8. Lower extremities: no clubbing, cyanosis, or edema 9. Neurologically  strength 4 out of 5 left  upper extremity cranial nerves II through XII intact 10. MSK: Normal range of motion   LABS:     Recent Labs  Lab 05/08/18 1430  WBC 6.9  NEUTROABS 3.7  HGB 13.6  HCT 40.5  MCV 89.9  PLT 381   Basic Metabolic Panel: Recent Labs  Lab 05/08/18 1430  NA 137  K 3.5  CL 98*  CO2 30  GLUCOSE 90  BUN 18  CREATININE 1.00  CALCIUM 9.6      No results for input(s): AST, ALT, ALKPHOS, BILITOT, PROT, ALBUMIN in the last 168 hours. No results for input(s): LIPASE, AMYLASE in the last 168 hours. No results for input(s): AMMONIA in the last 168 hours.    HbA1C: No results for input(s): HGBA1C in the last 72 hours. CBG: Recent Labs  Lab 05/08/18 1508  GLUCAP 80      Urine analysis:    Component Value Date/Time   COLORURINE COLORLESS (A) 05/08/2018 1434   APPEARANCEUR CLEAR (A) 05/08/2018 1434   APPEARANCEUR Clear 05/03/2012 2245   LABSPEC 1.002 (L) 05/08/2018 1434   LABSPEC 1.008 05/03/2012 2245   PHURINE 6.0 05/08/2018 1434   GLUCOSEU NEGATIVE 05/08/2018 1434   GLUCOSEU Negative 05/03/2012 2245   HGBUR NEGATIVE 05/08/2018 1434   BILIRUBINUR NEGATIVE 05/08/2018 1434   BILIRUBINUR Negative 05/03/2012 [place] 05/08/2018 1434   PROTEINUR NEGATIVE 05/08/2018  [place] 05/08/2018 [place] 05/08/2018 1434   LEUKOCYTESUR Negative 05/03/2012 2245       Cultures: No results found for: SDES, SPECREQUEST, CULT, REPTSTATUS   Radiological Exams on Admission: Ct Angio Head W Or Wo Contrast  Result Date: 05/08/2018 CLINICAL DATA:  Acute onset of dizziness an weakness beginning yesterday. Weakness particularly left-sided. Negative acute head CT today. EXAM: CT ANGIOGRAPHY HEAD AND NECK TECHNIQUE: Multidetector CT imaging of the head and neck was performed using the standard protocol during bolus administration of intravenous contrast. Multiplanar CT image reconstructions and MIPs were obtained to evaluate the vascular  anatomy. Carotid stenosis measurements (when applicable) are obtained utilizing NASCET criteria, using the distal internal carotid diameter as the denominator. CONTRAST:  18mL ISOVUE-370 IOPAMIDOL (ISOVUE-370) INJECTION 76% COMPARISON:  Head CT earlier same day and 05/03/2012. FINDINGS: CTA NECK FINDINGS Aortic arch: Mild aortic atherosclerosis. No aneurysm or dissection. Branching pattern of the brachiocephalic vessels from the arch is normal. No origin stenosis. Right carotid system: Common carotid artery widely patent to the bifurcation region. Soft and calcified plaque at the bifurcation and ICA bulb but no stenosis. Cervical ICA tortuous but widely patent. Left carotid system: Left common carotid artery widely patent to the bifurcation region. Soft and calcified plaque at the carotid bifurcation but no stenosis. Cervical ICA tortuous but widely patent. Vertebral arteries: Both vertebral arteries patent at their origins. Narrowing and irregularity of both proximal vertebral arteries, more severe on the right than the left. This could be due to advanced diffuse atherosclerotic disease, but dissection is not excluded on either side. More distally, the vessels appear widely patent and normal. Skeleton: Normal Other neck: No mass or lymphadenopathy. The patient does have thyroid goiter. Upper chest: Lung apices are clear and normal. Review of the MIP images confirms the above findings CTA HEAD FINDINGS Anterior circulation: Both internal carotid arteries are patent through the skull base and siphon regions without stenosis. The anterior and middle cerebral vessels are patent without proximal stenosis, aneurysm or vascular malformation. Posterior circulation: Both vertebral arteries are widely patent through the foramen magnum to the basilar. No basilar stenosis. Posterior circulation branch vessels appear patent and normal. Venous sinuses: Patent and normal. Anatomic variants: None significant. Delayed phase: No  abnormal enhancement. Review of the MIP images confirms the above findings IMPRESSION: Carotid bifurcation atherosclerosis but no stenosis or irregularity. No intracranial anterior circulation abnormality. Abnormal appearance of the proximal vertebral arteries bilaterally. I think the appearance is more consistent with advanced atherosclerotic disease, with narrowing and irregularity. I cannot exclude the possibility of vertebral artery dissection on either or both sides however. In the upper cervical region, the vertebral arteries regain a normal appearance in the vessels are widely patent through the foramen magnum to the basilar, without identifiable intracranial posterior circulation abnormality. Electronically Signed   By: Nelson Chimes M.D.   On: 05/08/2018 16:06   Ct Angio Neck W And/or Wo Contrast  Result Date: 05/08/2018 CLINICAL DATA:  Acute onset of dizziness an weakness beginning yesterday. Weakness particularly left-sided. Negative acute head CT today. EXAM: CT ANGIOGRAPHY HEAD AND NECK TECHNIQUE: Multidetector CT imaging of the head and neck was performed using the standard protocol during bolus administration of intravenous contrast. Multiplanar CT image reconstructions and MIPs were obtained to evaluate the vascular anatomy. Carotid stenosis measurements (when applicable) are obtained utilizing NASCET criteria, using the distal internal carotid diameter as the denominator. CONTRAST:  75mL ISOVUE-370 IOPAMIDOL (ISOVUE-370) INJECTION 76% COMPARISON:  Head CT earlier same day and 05/03/2012. FINDINGS: CTA NECK FINDINGS Aortic arch: Mild aortic atherosclerosis. No aneurysm or dissection. Branching pattern of the brachiocephalic vessels from the arch is normal. No origin stenosis. Right carotid system: Common carotid artery widely patent to the bifurcation region. Soft and calcified plaque at the bifurcation and ICA bulb but no stenosis. Cervical ICA tortuous but widely patent. Left carotid system: Left  common carotid artery widely patent to the bifurcation region. Soft and calcified plaque at the carotid bifurcation but no stenosis. Cervical ICA tortuous but widely patent. Vertebral arteries: Both vertebral arteries patent at their origins. Narrowing and irregularity of both proximal vertebral arteries, more severe on the right than the left. This could be due to advanced diffuse atherosclerotic disease, but dissection is not excluded on either side. More distally, the vessels appear widely patent and normal. Skeleton: Normal Other neck: No mass or lymphadenopathy. The patient does have thyroid goiter. Upper chest: Lung apices are clear and normal. Review of the MIP images confirms the above findings CTA HEAD FINDINGS Anterior circulation: Both internal carotid arteries are patent through the skull base and siphon regions without stenosis. The anterior and middle cerebral vessels are patent without proximal stenosis, aneurysm or vascular malformation. Posterior circulation: Both vertebral arteries are widely patent through the foramen magnum to the basilar. No basilar stenosis. Posterior circulation branch vessels appear patent and normal. Venous sinuses: Patent and normal. Anatomic variants: None significant. Delayed phase: No abnormal enhancement. Review of the MIP images confirms the above findings IMPRESSION: Carotid bifurcation atherosclerosis but no stenosis or irregularity. No intracranial anterior circulation abnormality. Abnormal appearance of the proximal vertebral arteries bilaterally. I think the appearance is more consistent with advanced atherosclerotic disease, with narrowing and irregularity. I cannot exclude the possibility of vertebral artery dissection on either or both sides however. In the upper cervical region, the vertebral arteries regain a normal appearance in the vessels are widely patent through the foramen magnum to the basilar, without identifiable intracranial posterior circulation  abnormality. Electronically Signed   By: Nelson Chimes M.D.   On: 05/08/2018 16:06   Ct Head Code Stroke Wo Contrast  Result Date: 05/08/2018 CLINICAL DATA:  Code stroke. Left-sided weakness and syncopal episode yesterday which than resolved. Syncopal episode today with subsequent facial numbness. Last seen normal 2 hours ago. EXAM: CT HEAD WITHOUT CONTRAST TECHNIQUE: Contiguous axial images were obtained from the base of the skull through the vertex without intravenous contrast. COMPARISON:  CT head 05/03/2012. FINDINGS: Brain: A remote lacunar infarct of the right caudate head and anterior limb right internal capsule is again seen. Periventricular and subcortical white matter changes are mildly advanced for age. Basal ganglia are otherwise intact. Insular cortex is normal. Ventricles are of normal size. No significant extra-axial fluid collection is present. The brainstem and cerebellum are normal. Vascular: No hyperdense vessel or unexpected calcification. Skull: Calvarium is intact. No focal lytic or blastic lesions are present. No significant extracranial soft tissue lesions are present. Sinuses/Orbits: The paranasal sinuses and right mastoid air cells are clear. Sclerotic changes of the left mastoid are stable. The globes and orbits are within normal limits. ASPECTS [facility] Stroke Program Early CT Score) - Ganglionic level infarction (caudate, lentiform nuclei, internal capsule, insula, M1-M3 cortex): 7/7 - Supraganglionic infarction (M4-M6 cortex): 3/3 Total score (0-10 with 10 being normal): 10/10 IMPRESSION: 1. No acute intracranial abnormality. 2. 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 3. Atrophy and white matter  changes are mildly advanced for age. The finding is nonspecific but can be seen in the setting of chronic microvascular ischemia, a demyelinating process such as multiple sclerosis, vasculitis, complicated migraine headaches, or as the  sequelae of a prior infectious or inflammatory process. 4. ASPECTS is 10/10 These results were called by telephone at the time of interpretation on 05/08/2018 at 3:11 pm to Dr. Conni Slipper , who verbally acknowledged these results. Electronically Signed   By: San Morelle M.D.   On: 05/08/2018 15:12    Chart has been reviewed    Assessment/Plan  62 y.o. male with medical history significant of HTN, HLD, chronic pain  Admitted for cva like symptoms  Present on Admission: . CVA (cerebral vascular accident) ([place]) like symptoms -  - will admit based on TIA/CVA protocol, await results of  MRI, Carotid Doppler and Echo, obtain cardiac enzymes,  ECG,   Lipid panel, TSH. Order PT/OT evaluation. Will make sure patient is on antiplatelet agent.   Neurology consult.      . Hyperlipidemia LDL goal <100  -check lipid panel in a.m. [name] Hypertension -allow permissive hypertension for tonight restart home medications as able   Other plan as per orders.  DVT prophylaxis:  SCD  Code Status:  FULL CODE as per patient  I had personally discussed CODE STATUS with patient and family  Family Communication:   Family at  Bedside  plan of care was discussed with  ife,    Disposition Plan:      To home once workup is complete and patient is stable                        Would benefit from PT/OT eval prior to DC   ordered                     Consults called: Neurology   Admission status:   inpatient       Level of care  tele               Toy Baker 05/08/2018, 11:27 PM    [facility]  Pager [phone]   after 2 AM please page floor coverage PA If 7AM-7PM, please contact the day team taking care of the patient  [email or web address]  Password [email or web address]

## 2018-05-08 NOTE — Consult Note (Addendum)
[facility] Teleneurology Consult  Tyrone was a stat consult for a stroke.     Tyrone Mcdowell is a 62 yo Mcdowell with a PMH of HTN and chronic leg pain due to an accident int eh [phone]  Symptoms started last week.   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   Tyrone Mcdowell was able to sit up and stop.  The episode last a few seconds.     Yesterday, Tyrone Mcdowell was outside walking.  Tyrone Mcdowell looked up, and then suddenly Tyrone Mcdowell became dizzy.  Tyrone Mcdowell went to lean on knees.   Tyrone Mcdowell sort of fell to the ground and laid on the ground for a bit.  Tyrone Mcdowell was eventually able to get up and walk but but was unsteady.    Today, Tyrone Mcdowell had another episode when Tyrone Mcdowell stood up and then got dizzy and sweaty.    Tyrone Mcdowell had to lay down and take a nap.  Tyrone Mcdowell had some tingling and numbness of the left side of his face.   Tyrone Mcdowell felt that his face was drooping as well.  Tyrone was around 1 PM today.       LKW:13:00  Arrival Time:14:12  Call time:15:15  First attempt log in:15:23  Time of Eval:15:23   NIHSS - 0  1A: Level of Consciousness - Alert; keenly responsive 0 1B: Ask Month and Age - Both Questions Right 0 2: Test Horizontal Extraocular Movements - Normal 0 3: Test Visual Fields - No Visual Loss 0 4: Test Facial Palsy - Normal symmetry 0 5A: Test Left Arm Motor Drift - No Drift for 10 Seconds 0 5B: Test Right Arm Motor Drift - No Drift for 10 Seconds 0 6A: Test Left Leg Motor Drift - No Drift for 5 Seconds 0 6B: Test Right Leg Motor Drift - No Drift for 5 Seconds 0 7: Test Limb Ataxia - No Ataxia 0 8: Test Sensation - Normal; No sensory loss 0 9: Test Language/Aphasia - Normal; No aphasia 0 10: Test Dysarthria - Normal 0 11: Test Extinction/Inattention - No abnormality 0   Impresssion:   Pre-syncopal episodes  Dizziness  Facial tingling    Tyrone is a 62 yo Mcdowell who is presenting with dizziness and what sounds like pre-syncopal type episodes.  Etilogy unclear.  Stroke may be possible but Tyrone Mcdowell has no appreciable deficits at  Tyrone time with an NIHSS of 0.   tPA is not reccomended for Tyrone reason.  I see no role for IR, but I do recommend a CTA of the head and neck to r/o a vascular stenosis as the cause of these events.     CT negative for a bleed, prior stroke and whiter matter disease.     Reccomendations:   CTA head and neck  MRI brain w/o  Stroke labs  Echo  ASA  Statin  Neurology consult.   D/w the ED attending.   Thank you for Tyrone consult.  [facility].      Patient was informed the Neurology Consult would happen via TeleHealth consult by way of interactive audio and video telecommunications and consented to receiving care in Tyrone manner.      CTA results: IMPRESSION: Carotid bifurcation atherosclerosis but no stenosis or irregularity. No intracranial anterior circulation abnormality.  Abnormal appearance of the proximal vertebral arteries bilaterally. I think the appearance is more consistent with advanced atherosclerotic disease, with narrowing and irregularity. I cannot exclude the possibility of vertebral artery dissection on either  or both sides however. In the upper cervical region, the vertebral arteries regain a normal appearance in the vessels are widely patent through the foramen    - given the patient's recurrent events, it is quite possible that these findings are symptomatc and may require neuro IR on a nonemergent but urgent basis.    D/w the ED attneding and agree that transferring to [facility] would be reaonable for further evaluation and possible treatment.   I do not recommend anticoagulation at Tyrone time, but would recommend ASA and statin as well as IVF's.    Case d/w the ED attending.

## 2018-05-08 NOTE — Consult Note (Signed)
Requesting Physician: Dr. Roel Cluck    Chief Complaint: Dizziness, numbness of left face, head  History obtained from: Patient and Chart     HPI:                                                                                                                                       Tyrone Mcdowell is an 62 y.o. male past medical history of hypertension, hyperlipidemia who presents with 3 episodes of dizziness, gait imbalance lasting 3 to 4 minutes since the last week. His first episode occurred a week ago while he was driving in the car and he suddenly felt off balance and dizzy lasting about 3 to 4 minutes.  He pulled over and waited on the side and his symptoms resolved.  He denies any double vision, slurred speech, weakness in the arm or leg.  His episode resolved and patient returned to his usual activities. Patient had another episode today lasting for 3 to 4 minutes where he felt off balance, followed by nausea.  Does state that he has been having headaches since the last 3 weeks there are more frequent than he normally gets the headaches.   He was evaluated at [facility] but they performed a CT head which is negative and a CTA head and neck which showed abnormal appearance of the proximal vertebral arteries bilaterally.  Possible dissection was mentioned in the report and patient was transferred to [facility] for further evaluation.   Past Medical History:  Diagnosis Date  . Anxiety    takes Valium as needed  . Arthritis   . Chronic back pain    spondylolisthesis  . Depression    takes Zoloft daily  . Hyperlipidemia    takes Atorvastatin daily  . Hypertension    takes Micardis and AMlodipine daily  . Joint pain   . Joint swelling   . Peripheral edema    takes Lasix daily  . Urinary frequency    takes Rapaflo daily  . Weakness    numbness and tingling    Past Surgical History:  Procedure Laterality Date  . BACK SURGERY    . COLONOSCOPY    . TIBIA FRACTURE  SURGERY Bilateral 1989   multiple surgeries   . TIBIA HARDWARE REMOVAL  1989    Family History  Problem Relation Age of Onset  . Cancer Mother        lung Ca, tobacco abuse  . Stroke Father   . Hypertension Father   . Heart disease Father        pacemaker, heart failure  . Kidney disease Father   . Ulcerative colitis Father   . Cancer Sister 52       colon CA  . Cancer Other        colon CA at age 91  . Ulcerative colitis Brother    Social History:  reports that he  has quit smoking. His smoking use included cigarettes. He smoked 0.00 packs per day. His smokeless tobacco use includes chew. He reports that he does not drink alcohol or use drugs.  Allergies: No Known Allergies  Medications:                                                                                                                        I reviewed home medications   ROS:                                                                                                                                     14 systems reviewed and negative except above    Examination:                                                                                                      General: Appears well-developed and well-nourished.  Psych: Affect appropriate to situation Eyes: No scleral injection HENT: No OP obstrucion Head: Normocephalic.  Cardiovascular: Normal rate and regular rhythm.  Respiratory: Effort normal and breath sounds normal to anterior ascultation GI: Soft.  No distension. There is no tenderness.  Skin: WDI   Neurological Examination Mental Status: Alert, oriented, thought content appropriate.  Speech fluent without evidence of aphasia. Able to follow 3 step commands without difficulty. Cranial Nerves: II: Visual fields grossly normal,  III,IV, VI: ptosis not present, extra-ocular motions intact bilaterally, pupils equal, round, reactive to light and accommodation V,VII: smile symmetric, facial light  touch sensation normal bilaterally VIII: hearing normal bilaterally IX,X: uvula rises symmetrically XI: bilateral shoulder shrug XII: midline tongue extension Motor: Right : Upper extremity   5/5    Left:     Upper extremity   5/5  Lower extremity   5/5     Lower extremity   5/5 Tone and bulk:normal tone throughout; no atrophy noted Sensory: Pinprick and light touch intact throughout, bilaterally Deep Tendon Reflexes: 2+ and symmetric throughout Plantars: Right: downgoing  Left: downgoing Cerebellar: normal finger-to-nose, normal rapid alternating movements and normal heel-to-shin test Gait: normal gait and station     Lab Results: Basic Metabolic Panel: Recent Labs  Lab 05/08/18 1430  NA 137  K 3.5  CL 98*  CO2 30  GLUCOSE 90  BUN 18  CREATININE 1.00  CALCIUM 9.6    CBC: Recent Labs  Lab 05/08/18 1430  WBC 6.9  NEUTROABS 3.7  HGB 13.6  HCT 40.5  MCV 89.9  PLT 325    Coagulation Studies: Recent Labs    05/08/18 1430  LABPROT 13.8  INR 1.07    Imaging: Ct Angio Head W Or Wo Contrast  Result Date: 05/08/2018 CLINICAL DATA:  Acute onset of dizziness an weakness beginning yesterday. Weakness particularly left-sided. Negative acute head CT today. EXAM: CT ANGIOGRAPHY HEAD AND NECK TECHNIQUE: Multidetector CT imaging of the head and neck was performed using the standard protocol during bolus administration of intravenous contrast. Multiplanar CT image reconstructions and MIPs were obtained to evaluate the vascular anatomy. Carotid stenosis measurements (when applicable) are obtained utilizing NASCET criteria, using the distal internal carotid diameter as the denominator. CONTRAST:  35mL ISOVUE-370 IOPAMIDOL (ISOVUE-370) INJECTION 76% COMPARISON:  Head CT earlier same day and 05/03/2012. FINDINGS: CTA NECK FINDINGS Aortic arch: Mild aortic atherosclerosis. No aneurysm or dissection. Branching pattern of the brachiocephalic vessels from the arch is normal. No origin  stenosis. Right carotid system: Common carotid artery widely patent to the bifurcation region. Soft and calcified plaque at the bifurcation and ICA bulb but no stenosis. Cervical ICA tortuous but widely patent. Left carotid system: Left common carotid artery widely patent to the bifurcation region. Soft and calcified plaque at the carotid bifurcation but no stenosis. Cervical ICA tortuous but widely patent. Vertebral arteries: Both vertebral arteries patent at their origins. Narrowing and irregularity of both proximal vertebral arteries, more severe on the right than the left. This could be due to advanced diffuse atherosclerotic disease, but dissection is not excluded on either side. More distally, the vessels appear widely patent and normal. Skeleton: Normal Other neck: No mass or lymphadenopathy. The patient does have thyroid goiter. Upper chest: Lung apices are clear and normal. Review of the MIP images confirms the above findings CTA HEAD FINDINGS Anterior circulation: Both internal carotid arteries are patent through the skull base and siphon regions without stenosis. The anterior and middle cerebral vessels are patent without proximal stenosis, aneurysm or vascular malformation. Posterior circulation: Both vertebral arteries are widely patent through the foramen magnum to the basilar. No basilar stenosis. Posterior circulation branch vessels appear patent and normal. Venous sinuses: Patent and normal. Anatomic variants: None significant. Delayed phase: No abnormal enhancement. Review of the MIP images confirms the above findings IMPRESSION: Carotid bifurcation atherosclerosis but no stenosis or irregularity. No intracranial anterior circulation abnormality. Abnormal appearance of the proximal vertebral arteries bilaterally. I think the appearance is more consistent with advanced atherosclerotic disease, with narrowing and irregularity. I cannot exclude the possibility of vertebral artery dissection on either  or both sides however. In the upper cervical region, the vertebral arteries regain a normal appearance in the vessels are widely patent through the foramen magnum to the basilar, without identifiable intracranial posterior circulation abnormality. Electronically Signed   By: Nelson Chimes M.D.   On: 05/08/2018 16:06   Ct Angio Neck W And/or Wo Contrast  Result Date: 05/08/2018 CLINICAL DATA:  Acute onset of dizziness an weakness beginning yesterday. Weakness particularly left-sided. Negative acute head CT today. EXAM: CT  ANGIOGRAPHY HEAD AND NECK TECHNIQUE: Multidetector CT imaging of the head and neck was performed using the standard protocol during bolus administration of intravenous contrast. Multiplanar CT image reconstructions and MIPs were obtained to evaluate the vascular anatomy. Carotid stenosis measurements (when applicable) are obtained utilizing NASCET criteria, using the distal internal carotid diameter as the denominator. CONTRAST:  92mL ISOVUE-370 IOPAMIDOL (ISOVUE-370) INJECTION 76% COMPARISON:  Head CT earlier same day and 05/03/2012. FINDINGS: CTA NECK FINDINGS Aortic arch: Mild aortic atherosclerosis. No aneurysm or dissection. Branching pattern of the brachiocephalic vessels from the arch is normal. No origin stenosis. Right carotid system: Common carotid artery widely patent to the bifurcation region. Soft and calcified plaque at the bifurcation and ICA bulb but no stenosis. Cervical ICA tortuous but widely patent. Left carotid system: Left common carotid artery widely patent to the bifurcation region. Soft and calcified plaque at the carotid bifurcation but no stenosis. Cervical ICA tortuous but widely patent. Vertebral arteries: Both vertebral arteries patent at their origins. Narrowing and irregularity of both proximal vertebral arteries, more severe on the right than the left. This could be due to advanced diffuse atherosclerotic disease, but dissection is not excluded on either side. More  distally, the vessels appear widely patent and normal. Skeleton: Normal Other neck: No mass or lymphadenopathy. The patient does have thyroid goiter. Upper chest: Lung apices are clear and normal. Review of the MIP images confirms the above findings CTA HEAD FINDINGS Anterior circulation: Both internal carotid arteries are patent through the skull base and siphon regions without stenosis. The anterior and middle cerebral vessels are patent without proximal stenosis, aneurysm or vascular malformation. Posterior circulation: Both vertebral arteries are widely patent through the foramen magnum to the basilar. No basilar stenosis. Posterior circulation branch vessels appear patent and normal. Venous sinuses: Patent and normal. Anatomic variants: None significant. Delayed phase: No abnormal enhancement. Review of the MIP images confirms the above findings IMPRESSION: Carotid bifurcation atherosclerosis but no stenosis or irregularity. No intracranial anterior circulation abnormality. Abnormal appearance of the proximal vertebral arteries bilaterally. I think the appearance is more consistent with advanced atherosclerotic disease, with narrowing and irregularity. I cannot exclude the possibility of vertebral artery dissection on either or both sides however. In the upper cervical region, the vertebral arteries regain a normal appearance in the vessels are widely patent through the foramen magnum to the basilar, without identifiable intracranial posterior circulation abnormality. Electronically Signed   By: Nelson Chimes M.D.   On: 05/08/2018 16:06   Mr Brain Wo Contrast  Result Date: 05/09/2018 CLINICAL DATA:  Intermittent dizziness and ataxia for a week. Left facial numbness. History of hypertension. EXAM: MRI HEAD WITHOUT CONTRAST TECHNIQUE: Multiplanar, multiecho pulse sequences of the brain and surrounding structures were obtained without intravenous contrast. COMPARISON:  CT head 05/08/2018 FINDINGS: INTRACRANIAL  CONTENTS: No reduced diffusion to suggest acute ischemia. No susceptibility artifact to suggest hemorrhage. Old right caudate infarct with ex-vacuo right lateral ventricle. No overall parenchymal brain volume loss for age. A few scattered supratentorial subcentimeter T2 hyperintensities. No suspicious parenchymal signal, masses, mass effect. No abnormal extra-axial fluid collections. No extra-axial masses. VASCULAR: Normal major intracranial vascular flow voids present at skull base. SKULL AND UPPER CERVICAL SPINE: No abnormal sellar expansion. No suspicious calvarial bone marrow signal. Craniocervical junction maintained. SINUSES/ORBITS: Trace paranasal sinus mucosal thickening, small right ethmoid mucosal retention cyst. Left mastoid effusion.The included ocular globes and orbital contents are non-suspicious. OTHER: None. IMPRESSION: 1.  No acute intracranial process. 2. Old right basal ganglia  infarct. Mild chronic small vessel ischemic changes. Electronically Signed   By: Elon Alas M.D.   On: 05/09/2018 00:32   Ct Head Code Stroke Wo Contrast  Result Date: 05/08/2018 CLINICAL DATA:  Code stroke. Left-sided weakness and syncopal episode yesterday which than resolved. Syncopal episode today with subsequent facial numbness. Last seen normal 2 hours ago. EXAM: CT HEAD WITHOUT CONTRAST TECHNIQUE: Contiguous axial images were obtained from the base of the skull through the vertex without intravenous contrast. COMPARISON:  CT head 05/03/2012. FINDINGS: Brain: A remote lacunar infarct of the right caudate head and anterior limb right internal capsule is again seen. Periventricular and subcortical white matter changes are mildly advanced for age. Basal ganglia are otherwise intact. Insular cortex is normal. Ventricles are of normal size. No significant extra-axial fluid collection is present. The brainstem and cerebellum are normal. Vascular: No hyperdense vessel or unexpected calcification. Skull: Calvarium  is intact. No focal lytic or blastic lesions are present. No significant extracranial soft tissue lesions are present. Sinuses/Orbits: The paranasal sinuses and right mastoid air cells are clear. Sclerotic changes of the left mastoid are stable. The globes and orbits are within normal limits. ASPECTS [facility] Stroke Program Early CT Score) - Ganglionic level infarction (caudate, lentiform nuclei, internal capsule, insula, M1-M3 cortex): 7/7 - Supraganglionic infarction (M4-M6 cortex): 3/3 Total score (0-10 with 10 being normal): 10/10 IMPRESSION: 1. No acute intracranial abnormality. 2. 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 3. Atrophy and white matter changes are mildly advanced for age. The finding is nonspecific but can be seen in the setting of chronic microvascular ischemia, a demyelinating process such as multiple sclerosis, vasculitis, complicated migraine headaches, or as the sequelae of a prior infectious or inflammatory process. 4. ASPECTS is 10/10 These results were called by telephone at the time of interpretation on 05/08/2018 at 3:11 pm to Dr. Conni Slipper , who verbally acknowledged these results. Electronically Signed   By: San Morelle M.D.   On: 05/08/2018 15:12     ASSESSMENT AND PLAN  62 y.o. male past medical history of hypertension, hyperlipidemia who presents with 3 episodes of dizziness.  CTA head and neck of  regional showed atherosclerotic changes in both vertebral arteries, however dissection could not be ruled out and patient was transferred to low suspect for further evaluation.  Patient was told that he would get a four-vessel angiogram.   The patient has back to his baseline, denies chronic headache or neck pain and MRI brain is negative for acute infarct. Also reviewed his CTA-did not see a clear dissection.  I have a low suspicion that this is a dissection and his symptoms are from TIA given they are  stereotypical in nature.  I do not at this time feel patient needs a four-vessel angiogram.   HTN urgency vs complicated migraine vs Vertebrobasilar insufficiency  Acute Ischemic Stroke   Risk factors Etiology:   #Transthoracic Echo  # Start patient on ASA 325mg  daily # BP goal: normotension # HBAIC and Lipid profile # Telemetry monitoring # Frequent neuro checks #  stroke swallow screen :passed  Today discussed with stroke team whether patient needs conventional angiogram.  Please page stroke NP  Or  PA  Or MD from 8am -4 pm  as this patient from this time will be  followed by the stroke.   You can look them up on [email or web address]  Password [facility]   Sushanth Aroor [facility] Pager Number [phone]

## 2018-05-08 NOTE — ED Notes (Signed)
EMTALA completed, VS within 30 mins of transfer and consent singed

## 2018-05-08 NOTE — Progress Notes (Signed)
   05/08/18 1457  Clinical Encounter Type  Visited With Patient and family together  Visit Type Initial (Code Stroke)  Referral From Nurse  Consult/Referral To Chaplain   [place] responded to CD Stroke, maintained presence outside patient's room while doctors responded, once room cleared, [place] checked in with patient's wife and she declined any spiritual services, [place] reminded her that we are here if they need [place]

## 2018-05-08 NOTE — ED Notes (Signed)
Patient transported to CT 

## 2018-05-08 NOTE — ED Notes (Signed)
Patient refused several offers for a wheelchair.  Denies current c/o dizziness.

## 2018-05-08 NOTE — ED Notes (Signed)
Pt passed stroke swallow screen with no distress

## 2018-05-08 NOTE — ED Notes (Signed)
Per spouse pt became dizzy with standing and then noticed lft sided facial numbness. LKW was aprox 1300 today

## 2018-05-08 NOTE — Code Documentation (Signed)
Pt arrives with complaints of left facial numbness, states he had a near syncopal episode last week, and yesterday which he returned to baseline after, today at 1300 pt had a near syncopal episode and woke up with left facial numbness, code stroke activated in triage, pt taken to CT for non con head CT then room 3, wife at bedside, NIHSS 0, pt denies any numbness at time of exam, CTA ordered, report off to [facility]

## 2018-05-08 NOTE — ED Notes (Signed)
Stroke swallow screen postponed until neurology states that it is ok to proceed.

## 2018-05-08 NOTE — Progress Notes (Signed)
Pt arrived to 5W, room 14 via EMS stretcher. Pt A &O x 4, no acute distress noted. BP 184/105, HR 59. Skin intact. Denies pain. Full assessment completed. Paged admitting provider to notify of elevated BP and need for admission orders. Safety precautions intitated, call bell in reach. Will monitor patient

## 2018-05-08 NOTE — ED Notes (Signed)
Neurology on at bedside at this time

## 2018-05-08 NOTE — ED Notes (Signed)
Called [facility] for transport to [facility]  Neurology  [phone]

## 2018-05-09 ENCOUNTER — Encounter (HOSPITAL_COMMUNITY): Payer: Self-pay | Admitting: Cardiology

## 2018-05-09 ENCOUNTER — Other Ambulatory Visit: Payer: Self-pay | Admitting: Cardiology

## 2018-05-09 ENCOUNTER — Inpatient Hospital Stay (HOSPITAL_COMMUNITY): Payer: 59

## 2018-05-09 DIAGNOSIS — I6503 Occlusion and stenosis of bilateral vertebral arteries: Secondary | ICD-10-CM

## 2018-05-09 DIAGNOSIS — R0681 Apnea, not elsewhere classified: Secondary | ICD-10-CM

## 2018-05-09 DIAGNOSIS — R079 Chest pain, unspecified: Secondary | ICD-10-CM

## 2018-05-09 DIAGNOSIS — R072 Precordial pain: Secondary | ICD-10-CM

## 2018-05-09 DIAGNOSIS — G459 Transient cerebral ischemic attack, unspecified: Principal | ICD-10-CM

## 2018-05-09 DIAGNOSIS — Z8673 Personal history of transient ischemic attack (TIA), and cerebral infarction without residual deficits: Secondary | ICD-10-CM

## 2018-05-09 DIAGNOSIS — R42 Dizziness and giddiness: Secondary | ICD-10-CM

## 2018-05-09 DIAGNOSIS — I10 Essential (primary) hypertension: Secondary | ICD-10-CM

## 2018-05-09 DIAGNOSIS — Z6839 Body mass index (BMI) 39.0-39.9, adult: Secondary | ICD-10-CM

## 2018-05-09 LAB — RAPID URINE DRUG SCREEN, HOSP PERFORMED
Amphetamines: NOT DETECTED
BARBITURATES: NOT DETECTED
Benzodiazepines: NOT DETECTED
Cocaine: NOT DETECTED
Opiates: NOT DETECTED
TETRAHYDROCANNABINOL: NOT DETECTED

## 2018-05-09 LAB — LIPID PANEL
Cholesterol: 170 mg/dL (ref 0–200)
HDL: 35 mg/dL — AB (ref >40)
LDL CALC: 113 mg/dL — AB (ref 0–99)
Total CHOL/HDL Ratio: 4.9 RATIO
Triglycerides: 111 mg/dL (ref <150)
VLDL: 22 mg/dL (ref 0–40)

## 2018-05-09 LAB — URINALYSIS, DIPSTICK ONLY
Bilirubin Urine: NEGATIVE
Glucose, UA: NEGATIVE mg/dL
KETONES UR: NEGATIVE mg/dL
Leukocytes, UA: NEGATIVE
Nitrite: NEGATIVE
PROTEIN: NEGATIVE mg/dL
Specific Gravity, Urine: 1.015 (ref 1.005–1.030)
pH: 5 (ref 5.0–8.0)

## 2018-05-09 LAB — HEMOGLOBIN A1C
Hgb A1c MFr Bld: 5.5 % (ref 4.8–5.6)
Mean Plasma Glucose: 111.15 mg/dL

## 2018-05-09 LAB — HIV ANTIBODY (ROUTINE TESTING W REFLEX): HIV SCREEN 4TH GENERATION: NONREACTIVE

## 2018-05-09 MED ORDER — DULOXETINE HCL 30 MG PO CPEP
30.0000 mg | ORAL_CAPSULE | Freq: Two times a day (BID) | ORAL | Status: DC
  Administered 2018-05-09 – 2018-05-10 (×3): 30 mg via ORAL
  Filled 2018-05-09 (×3): qty 1

## 2018-05-09 MED ORDER — TRAMADOL HCL 50 MG PO TABS: 50.0000 mg | ORAL_TABLET | Freq: Four times a day (QID) | ORAL | Status: DC | PRN

## 2018-05-09 MED ORDER — ATORVASTATIN CALCIUM 20 MG PO TABS
20.0000 mg | ORAL_TABLET | Freq: Every day | ORAL | Status: DC
  Administered 2018-05-09: 20 mg via ORAL
  Filled 2018-05-09: qty 1

## 2018-05-09 MED ORDER — GABAPENTIN 100 MG PO CAPS
200.0000 mg | ORAL_CAPSULE | Freq: Three times a day (TID) | ORAL | Status: DC
  Administered 2018-05-09 – 2018-05-10 (×4): 200 mg via ORAL
  Filled 2018-05-09 (×3): qty 2

## 2018-05-09 MED ORDER — CLOPIDOGREL BISULFATE 75 MG PO TABS
75.0000 mg | ORAL_TABLET | Freq: Every day | ORAL | Status: DC
Start: 2018-05-09 — End: 2018-05-10
  Administered 2018-05-09 – 2018-05-10 (×2): 75 mg via ORAL
  Filled 2018-05-09 (×2): qty 1

## 2018-05-09 MED ORDER — AMLODIPINE BESYLATE 10 MG PO TABS
10.0000 mg | ORAL_TABLET | Freq: Every day | ORAL | Status: DC
  Administered 2018-05-09 – 2018-05-10 (×2): 10 mg via ORAL
  Filled 2018-05-09 (×2): qty 1

## 2018-05-09 MED ORDER — HYDROCHLOROTHIAZIDE 25 MG PO TABS
25.0000 mg | ORAL_TABLET | Freq: Every day | ORAL | Status: DC
  Administered 2018-05-09 – 2018-05-10 (×2): 25 mg via ORAL
  Filled 2018-05-09 (×2): qty 1

## 2018-05-09 NOTE — Progress Notes (Addendum)
STROKE TEAM PROGRESS NOTE   INTERVAL HISTORY No family is at the bedside.  He is up in the chair. No new complains. No further dizziness. 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   He does report recent family stress, often waking with a tension HA. HA not normal for him. No hx HA previously. No HA today.  Vitals:   05/08/18 2052 05/08/18 2100 05/09/18 0614 05/09/18 0840  BP:  (!) 184/104 (!) 170/92 (!) 169/85  Pulse:  (!) 56 65 (!) 59  Resp:    20  Temp:   98 F (36.7 C) 98.7 F (37.1 C)  TempSrc:   Oral Oral  SpO2: 98% 98% 97% 97%    CBC:  Recent Labs  Lab 05/08/18 1430  WBC 6.9  NEUTROABS 3.7  HGB 13.6  HCT 40.5  MCV 89.9  PLT 716    Basic Metabolic Panel:  Recent Labs  Lab 05/08/18 1430  NA 137  K 3.5  CL 98*  CO2 30  GLUCOSE 90  BUN 18  CREATININE 1.00  CALCIUM 9.6   Lipid Panel:     Component Value Date/Time   CHOL 170 05/09/2018 0342   TRIG 111 05/09/2018 0342   HDL 35 (L) 05/09/2018 0342   CHOLHDL 4.9 05/09/2018 0342   VLDL 22 05/09/2018 0342   LDLCALC 113 (H) 05/09/2018 0342   HgbA1c:  Lab Results  Component Value Date   HGBA1C 5.5 05/09/2018   Urine Drug Screen:     Component Value Date/Time   LABOPIA NONE DETECTED 05/09/2018 0231   COCAINSCRNUR NONE DETECTED 05/09/2018 0231   COCAINSCRNUR NONE DETECTED 05/08/2018 1434   LABBENZ NONE DETECTED 05/09/2018 0231   AMPHETMU NONE DETECTED 05/09/2018 0231   THCU NONE DETECTED 05/09/2018 0231   LABBARB NONE DETECTED 05/09/2018 0231    Alcohol Level No results found for: ETH  IMAGING Ct Angio Head W Or Wo Contrast  Result Date: 05/08/2018 CLINICAL DATA:  Acute onset of dizziness an weakness beginning yesterday. Weakness particularly left-sided. Negative  acute head CT today. EXAM: CT ANGIOGRAPHY HEAD AND NECK TECHNIQUE: Multidetector CT imaging of the head and neck was performed using the standard protocol during bolus administration of intravenous contrast. Multiplanar CT image reconstructions and MIPs were obtained to evaluate the vascular anatomy. Carotid stenosis measurements (when applicable) are obtained utilizing NASCET criteria, using the distal internal carotid diameter as the denominator. CONTRAST:  38mL ISOVUE-370 IOPAMIDOL (ISOVUE-370) INJECTION 76% COMPARISON:  Head CT earlier same day and 05/03/2012. FINDINGS: CTA NECK FINDINGS Aortic arch: Mild aortic atherosclerosis. No aneurysm or dissection. Branching pattern of the brachiocephalic vessels from the arch is normal. No origin stenosis. Right carotid system: Common carotid artery widely patent to the bifurcation region. Soft and calcified plaque at the bifurcation and ICA bulb but no stenosis. Cervical ICA tortuous but widely patent. Left carotid system: Left common carotid artery widely patent to the bifurcation region. Soft and calcified plaque at the carotid bifurcation but no stenosis. Cervical ICA tortuous but widely patent. Vertebral arteries: Both vertebral arteries patent at their origins. Narrowing and irregularity of both proximal vertebral arteries, more severe on the right than the left. This could be due to advanced diffuse atherosclerotic disease, but dissection is not excluded  on either side. More distally, the vessels appear widely patent and normal. Skeleton: Normal Other neck: No mass or lymphadenopathy. The patient does have thyroid goiter. Upper chest: Lung apices are clear and normal. Review of the MIP images confirms the above findings CTA HEAD FINDINGS Anterior circulation: Both internal carotid arteries are patent through the skull base and siphon regions without stenosis. The anterior and middle cerebral vessels are patent without proximal stenosis, aneurysm or vascular  malformation. Posterior circulation: Both vertebral arteries are widely patent through the foramen magnum to the basilar. No basilar stenosis. Posterior circulation branch vessels appear patent and normal. Venous sinuses: Patent and normal. Anatomic variants: None significant. Delayed phase: No abnormal enhancement. Review of the MIP images confirms the above findings IMPRESSION: Carotid bifurcation atherosclerosis but no stenosis or irregularity. No intracranial anterior circulation abnormality. Abnormal appearance of the proximal vertebral arteries bilaterally. I think the appearance is more consistent with advanced atherosclerotic disease, with narrowing and irregularity. I cannot exclude the possibility of vertebral artery dissection on either or both sides however. In the upper cervical region, the vertebral arteries regain a normal appearance in the vessels are widely patent through the foramen magnum to the basilar, without identifiable intracranial posterior circulation abnormality. Electronically Signed   By: Nelson Chimes M.D.   On: 05/08/2018 16:06   Ct Angio Neck W And/or Wo Contrast  Result Date: 05/08/2018 CLINICAL DATA:  Acute onset of dizziness an weakness beginning yesterday. Weakness particularly left-sided. Negative acute head CT today. EXAM: CT ANGIOGRAPHY HEAD AND NECK TECHNIQUE: Multidetector CT imaging of the head and neck was performed using the standard protocol during bolus administration of intravenous contrast. Multiplanar CT image reconstructions and MIPs were obtained to evaluate the vascular anatomy. Carotid stenosis measurements (when applicable) are obtained utilizing NASCET criteria, using the distal internal carotid diameter as the denominator. CONTRAST:  70mL ISOVUE-370 IOPAMIDOL (ISOVUE-370) INJECTION 76% COMPARISON:  Head CT earlier same day and 05/03/2012. FINDINGS: CTA NECK FINDINGS Aortic arch: Mild aortic atherosclerosis. No aneurysm or dissection. Branching pattern of the  brachiocephalic vessels from the arch is normal. No origin stenosis. Right carotid system: Common carotid artery widely patent to the bifurcation region. Soft and calcified plaque at the bifurcation and ICA bulb but no stenosis. Cervical ICA tortuous but widely patent. Left carotid system: Left common carotid artery widely patent to the bifurcation region. Soft and calcified plaque at the carotid bifurcation but no stenosis. Cervical ICA tortuous but widely patent. Vertebral arteries: Both vertebral arteries patent at their origins. Narrowing and irregularity of both proximal vertebral arteries, more severe on the right than the left. This could be due to advanced diffuse atherosclerotic disease, but dissection is not excluded on either side. More distally, the vessels appear widely patent and normal. Skeleton: Normal Other neck: No mass or lymphadenopathy. The patient does have thyroid goiter. Upper chest: Lung apices are clear and normal. Review of the MIP images confirms the above findings CTA HEAD FINDINGS Anterior circulation: Both internal carotid arteries are patent through the skull base and siphon regions without stenosis. The anterior and middle cerebral vessels are patent without proximal stenosis, aneurysm or vascular malformation. Posterior circulation: Both vertebral arteries are widely patent through the foramen magnum to the basilar. No basilar stenosis. Posterior circulation branch vessels appear patent and normal. Venous sinuses: Patent and normal. Anatomic variants: None significant. Delayed phase: No abnormal enhancement. Review of the MIP images confirms the above findings IMPRESSION: Carotid bifurcation atherosclerosis but no stenosis or irregularity. No intracranial anterior  circulation abnormality. Abnormal appearance of the proximal vertebral arteries bilaterally. I think the appearance is more consistent with advanced atherosclerotic disease, with narrowing and irregularity. I cannot  exclude the possibility of vertebral artery dissection on either or both sides however. In the upper cervical region, the vertebral arteries regain a normal appearance in the vessels are widely patent through the foramen magnum to the basilar, without identifiable intracranial posterior circulation abnormality. Electronically Signed   By: Nelson Chimes M.D.   On: 05/08/2018 16:06   Mr Brain Wo Contrast  Result Date: 05/09/2018 CLINICAL DATA:  Intermittent dizziness and ataxia for a week. Left facial numbness. History of hypertension. EXAM: MRI HEAD WITHOUT CONTRAST TECHNIQUE: Multiplanar, multiecho pulse sequences of the brain and surrounding structures were obtained without intravenous contrast. COMPARISON:  CT head 05/08/2018 FINDINGS: INTRACRANIAL CONTENTS: No reduced diffusion to suggest acute ischemia. No susceptibility artifact to suggest hemorrhage. Old right caudate infarct with ex-vacuo right lateral ventricle. No overall parenchymal brain volume loss for age. A few scattered supratentorial subcentimeter T2 hyperintensities. No suspicious parenchymal signal, masses, mass effect. No abnormal extra-axial fluid collections. No extra-axial masses. VASCULAR: Normal major intracranial vascular flow voids present at skull base. SKULL AND UPPER CERVICAL SPINE: No abnormal sellar expansion. No suspicious calvarial bone marrow signal. Craniocervical junction maintained. SINUSES/ORBITS: Trace paranasal sinus mucosal thickening, small right ethmoid mucosal retention cyst. Left mastoid effusion.The included ocular globes and orbital contents are non-suspicious. OTHER: None. IMPRESSION: 1.  No acute intracranial process. 2. Old right basal ganglia infarct. Mild chronic small vessel ischemic changes. Electronically Signed   By: Elon Alas M.D.   On: 05/09/2018 00:32   Ct Head Code Stroke Wo Contrast  Result Date: 05/08/2018 CLINICAL DATA:  Code stroke. Left-sided weakness and syncopal episode yesterday which  than resolved. Syncopal episode today with subsequent facial numbness. Last seen normal 2 hours ago. EXAM: CT HEAD WITHOUT CONTRAST TECHNIQUE: Contiguous axial images were obtained from the base of the skull through the vertex without intravenous contrast. COMPARISON:  CT head 05/03/2012. FINDINGS: Brain: A remote lacunar infarct of the right caudate head and anterior limb right internal capsule is again seen. Periventricular and subcortical white matter changes are mildly advanced for age. Basal ganglia are otherwise intact. Insular cortex is normal. Ventricles are of normal size. No significant extra-axial fluid collection is present. The brainstem and cerebellum are normal. Vascular: No hyperdense vessel or unexpected calcification. Skull: Calvarium is intact. No focal lytic or blastic lesions are present. No significant extracranial soft tissue lesions are present. Sinuses/Orbits: The paranasal sinuses and right mastoid air cells are clear. Sclerotic changes of the left mastoid are stable. The globes and orbits are within normal limits. ASPECTS [facility] Stroke Program Early CT Score) - Ganglionic level infarction (caudate, lentiform nuclei, internal capsule, insula, M1-M3 cortex): 7/7 - Supraganglionic infarction (M4-M6 cortex): 3/3 Total score (0-10 with 10 being normal): 10/10 IMPRESSION: 1. No acute intracranial abnormality. 2. 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 3. Atrophy and white matter changes are mildly advanced for age. The finding is nonspecific but can be seen in the setting of chronic microvascular ischemia, a demyelinating process such as multiple sclerosis, vasculitis, complicated migraine headaches, or as the sequelae of a prior infectious or inflammatory process. 4. ASPECTS is 10/10 These results were called by telephone at the time of interpretation on 05/08/2018 at 3:11 pm to Dr. Conni Slipper , who verbally acknowledged these results.  Electronically Signed   By:  San Morelle M.D.   On: 05/08/2018 15:12   2D Echocardiogram  pending   PHYSICAL EXAM General: Appears well-developed and well-nourished, overweight Psych: Affect appropriate to situation Eyes: No scleral injection HENT: No OP obstrucion Head: Normocephalic.  Cardiovascular: Normal rate and regular rhythm.  Respiratory: Effort normal and breath sounds normal to anterior ascultation Skin: WDI  Neurological Examination Mental Status: Alert, oriented, thought content appropriate.  Speech fluent without evidence of aphasia. Able to follow 3 step commands without difficulty. Cranial Nerves: II: Visual fields grossly normal,  III,IV, VI: ptosis not present, extra-ocular motions intact bilaterally, pupils equal, round, reactive to light and accommodation V,VII: smile symmetric, facial light touch sensation normal bilaterally VIII: hearing normal bilaterally IX,X: uvula rises symmetrically XI: bilateral shoulder shrug XII: midline tongue extension Motor: Right :  Upper extremity   5/5                                      Left:     Upper extremity   5/5             Lower extremity   5/5                                                  Lower extremity   5/5 Tone and bulk:normal tone throughout; no atrophy noted Sensory: Pinprick and light touch intact throughout, bilaterally Plantars: Right: downgoing                                Left: downgoing Cerebellar: normal finger-to-nose, normal rapid alternating movements and normal heel-to-shin test Gait: neg Rhomberg. Balances on R > L   ASSESSMENT/PLAN Tyrone Mcdowell is a 62 y.o. male with history of HTN, HLD and former smoker/smokess tobacco user presenting ro [place] with dizziness, numbness of L face/head. Transferred to [facility] for DSA to look at VAs.   Recurrent episodes of Dizziness  Code Stroke CT head No acute stroke. old R caudate/ALIC infarct. Small vessel disease. Atrophy. ASPECTS 10.      CTA head & neck ICA atherosclerosis. B prox VA abnormal c/w advanced atherosclerosis but open distally  MRI  No acute stroke. Old R BG infarct. Small vessel disease.   2D Echo  pending   LDL 113  HgbA1c 5.5  SCDs for VTE prophylaxis  aspirin 81 mg daily prior to admission, now on aspirin 325 mg daily and clopidogrel 75 mg dailyx 3 months then aspirin alone  Therapy recommendations:  No therapy needs  Disposition:  pending   Hypertension  Elevated yesterday, 140s today . Permissive hypertension (OK if < 220/120) but gradually normalize in 5-7 days . Long-term BP goal normotensive  Hyperlipidemia  Home meds:  No statin  LDL 113, goal < 100  Add lipitor 20  Continue statin at discharge  Other Stroke Risk Factors  Former Cigarette smoker/smokeless tobacco user, quit 5 yrs ago  Obesity, There is no height or weight on file to calculate BMI., recommend weight loss, diet and exercise as appropriate   Family hx stroke (father)  No hx Migraines or HA  Hospital day # 1  Burnetta Sabin, MSN, APRN, [employer], AGPCNP-BC Advanced Practice Stroke Nurse [facility] See Amion for Schedule & Pager information 05/09/2018 1:46 PM   ATTENDING NOTE: I reviewed above note and agree with the assessment and plan. I have made any additions or clarifications directly to the above note. Pt was seen and examined.   62 year old male with history of lower back pain, depression, hyperlipidemia and hypertension admitted for 3 episode of disequilibrium, lightheadedness, diaphoresis, pale looking and one-time falling and nausea vomiting.  Symptoms are lasting 3 to 5 minutes.  He denies vertigo feeling and also denies any headache associate with the episodes, no ear ringing or hearing loss, no recent infection.  Episodes not consistently associate with motion.  Denies chest pain, shortness breath, or loss of consciousness.  CT and MRI unremarkable.  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  LDL 113 and A1c 5.5.  UDS negative. TTE pending.  Examination showed AAO x3, no nystagmus, no ataxia, head thrust test negative.  Dix-Hallpike maneuver negative.  Otherwise unremarkable neuro exam.  Patient presentation atypical, however, differential diagnoses include posterior TIA, presyncope, hypertensive urgency or vestibular proxysmia.   Cardiology consultation pending.  Given bilateral vertebral artery stenosis, will treat with aspirin 325 and Plavix 75 DAPT for 3 months and then Plavix alone.  Add Lipitor 24 high LDL.  BP high on admission, now on amlodipine and HCTZ. Also recommend BP check at home.  Will follow.  Rosalin Hawking, MD PhD Stroke Neurology 05/09/2018 4:19 PM     To contact Stroke Continuity provider, please refer to [email or web address] After hours, contact General Neurology

## 2018-05-09 NOTE — Progress Notes (Addendum)
PROGRESS NOTE    Tyrone Mcdowell  VCB:[id number] DOB: 1956-12-05 DOA: 05/08/2018 PCP: [name], MD  Brief Narrative: Tyrone Mcdowell is an 62 y.o. male past medical history of hypertension, hyperlipidemia, former smoker presented to ED with 3 episodes of dizziness in the last week, he reports lightheadedness and not vertigo, denies syncope. CT head and MRI negative for CVA, evidence of small old CVA noted Pt also mentions dyspnea on exertion for months and atleast 2 episodes of chest pressure, tightness recently which was self limiting  Assessment & Plan:     Dizziness -etiology unclear at this time -MRI Brain/CT unremarkable -CTA neck noted carotid bifurcation atherosclerosis but no stenosis and abnormal appearance of the proximal vertebral arteries bilaterally, appearance is more consistent with advanced atherosclerotic disease, but could not rule out vertebral dissection and sent to [facility] for further eval -Neurology following, workup unremarkable thus far, due to concomitant dyspnea on exertion and intermittent chest pain, I am concerned about possibility of CAD and will ask Cards to see if he needs a stress test -FU ECHO -PT/OT    Dyspnea on exertion for few months, intermittent chest pressure tightness for past week with dizziness as above -EKG unremarkable -ECHO pending, appears to be a vasculopath with significant atherosclerosis in intracerebral ves esp vertebral arteries, family h/o CAD with mother MI in 53s -will ask Cards to eval, very likely to have CAD, which could be contributing   Former smoker -35ppd, quit smoking 5years ago    Hypertension -check Orthostatics -continue HCTZ and amlodipine -hold ARB due to contrast in past 24hrs    Hyperlipidemia LDL goal <100 -continue Statin, LDL 113    Old CVA (cerebral vascular accident) ([place]) -incidental finding of Old CVA on MRI -ASA/Plavix/statin  DVT prophylaxis:SCDs Code Status: FUll Code Family Communication:  Wife at bedside Disposition Plan: Home pending workup  Consultants:   Neuro   Procedures:   Antimicrobials:    Subjective: -3 episodes of dizziness in last week or two, couple of episodes of chest pressure in between and dyspnea with exertion  Objective: Vitals:   05/08/18 2052 05/08/18 2100 05/09/18 0614 05/09/18 0840  BP:  (!) 184/104 (!) 170/92 (!) 169/85  Pulse:  (!) 56 65 (!) 59  Resp:    20  Temp:   98 F (36.7 C) 98.7 F (37.1 C)  TempSrc:   Oral Oral  SpO2: 98% 98% 97% 97%    Intake/Output Summary (Last 24 hours) at 05/09/2018 1127 Last data filed at 05/09/2018 1005 Gross per 24 hour  Intake 508.75 ml  Output -  Net 508.75 ml   There were no vitals filed for this visit.  Examination:  General exam: Appears calm and comfortable, no distress  Respiratory system: Clear to auscultation. Cardiovascular system: S1 & S2 heard, RRR. No JVD  Gastrointestinal system: Abdomen is nondistended, soft and nontender.Normal bowel sounds heard. Central nervous system: Alert and oriented. No focal neurological deficits. Extremities: Symmetric 5 x 5 power. Skin: No rashes, lesions or ulcers Psychiatry: Judgement and insight appear normal. Mood & affect appropriate.     Data Reviewed:   CBC: Recent Labs  Lab 05/08/18 1430  WBC 6.9  NEUTROABS 3.7  HGB 13.6  HCT 40.5  MCV 89.9  PLT 384   Basic Metabolic Panel: Recent Labs  Lab 05/08/18 1430  NA 137  K 3.5  CL 98*  CO2 30  GLUCOSE 90  BUN 18  CREATININE 1.00  CALCIUM 9.6   GFR: Estimated  Creatinine Clearance: 105.8 mL/min (by C-G formula based on SCr of 1 mg/dL). Liver Function Tests: No results for input(s): AST, ALT, ALKPHOS, BILITOT, PROT, ALBUMIN in the last 168 hours. No results for input(s): LIPASE, AMYLASE in the last 168 hours. No results for input(s): AMMONIA in the last 168 hours. Coagulation Profile: Recent Labs  Lab 05/08/18 1430  INR 1.07   Cardiac Enzymes: Recent Labs  Lab  05/08/18 1430 05/08/18 2109  TROPONINI <0.03 <0.03   BNP (last 3 results) No results for input(s): PROBNP in the last 8760 hours. HbA1C: Recent Labs    05/09/18 0342  HGBA1C 5.5   CBG: Recent Labs  Lab 05/08/18 1508  GLUCAP 80   Lipid Profile: Recent Labs    05/09/18 0342  CHOL 170  HDL 35*  LDLCALC 113*  TRIG 111  CHOLHDL 4.9   Thyroid Function Tests: No results for input(s): TSH, T4TOTAL, FREET4, T3FREE, THYROIDAB in the last 72 hours. Anemia Panel: No results for input(s): VITAMINB12, FOLATE, FERRITIN, TIBC, IRON, RETICCTPCT in the last 72 hours. Urine analysis:    Component Value Date/Time   COLORURINE YELLOW 05/09/2018 0231   APPEARANCEUR CLEAR 05/09/2018 0231   APPEARANCEUR Clear 05/03/2012 2245   LABSPEC 1.015 05/09/2018 0231   LABSPEC 1.008 05/03/2012 2245   PHURINE 5.0 05/09/2018 0231   GLUCOSEU NEGATIVE 05/09/2018 0231   GLUCOSEU Negative 05/03/2012 2245   HGBUR SMALL (A) 05/09/2018 0231   BILIRUBINUR NEGATIVE 05/09/2018 0231   BILIRUBINUR Negative 05/03/2012 2245   KETONESUR NEGATIVE 05/09/2018 0231   PROTEINUR NEGATIVE 05/09/2018 0231   NITRITE NEGATIVE 05/09/2018 0231   LEUKOCYTESUR NEGATIVE 05/09/2018 0231   LEUKOCYTESUR Negative 05/03/2012 2245   Sepsis Labs: @LABRCNTIP (procalcitonin:4,lacticidven:4)  )No results found for this or any previous visit (from the past 240 hour(s)).       Radiology Studies: Ct Angio Head W Or Wo Contrast  Result Date: 05/08/2018 CLINICAL DATA:  Acute onset of dizziness an weakness beginning yesterday. Weakness particularly left-sided. Negative acute head CT today. EXAM: CT ANGIOGRAPHY HEAD AND NECK TECHNIQUE: Multidetector CT imaging of the head and neck was performed using the standard protocol during bolus administration of intravenous contrast. Multiplanar CT image reconstructions and MIPs were obtained to evaluate the vascular anatomy. Carotid stenosis measurements (when applicable) are obtained utilizing  NASCET criteria, using the distal internal carotid diameter as the denominator. CONTRAST:  68mL ISOVUE-370 IOPAMIDOL (ISOVUE-370) INJECTION 76% COMPARISON:  Head CT earlier same day and 05/03/2012. FINDINGS: CTA NECK FINDINGS Aortic arch: Mild aortic atherosclerosis. No aneurysm or dissection. Branching pattern of the brachiocephalic vessels from the arch is normal. No origin stenosis. Right carotid system: Common carotid artery widely patent to the bifurcation region. Soft and calcified plaque at the bifurcation and ICA bulb but no stenosis. Cervical ICA tortuous but widely patent. Left carotid system: Left common carotid artery widely patent to the bifurcation region. Soft and calcified plaque at the carotid bifurcation but no stenosis. Cervical ICA tortuous but widely patent. Vertebral arteries: Both vertebral arteries patent at their origins. Narrowing and irregularity of both proximal vertebral arteries, more severe on the right than the left. This could be due to advanced diffuse atherosclerotic disease, but dissection is not excluded on either side. More distally, the vessels appear widely patent and normal. Skeleton: Normal Other neck: No mass or lymphadenopathy. The patient does have thyroid goiter. Upper chest: Lung apices are clear and normal. Review of the MIP images confirms the above findings CTA HEAD FINDINGS Anterior circulation: Both internal carotid arteries  are patent through the skull base and siphon regions without stenosis. The anterior and middle cerebral vessels are patent without proximal stenosis, aneurysm or vascular malformation. Posterior circulation: Both vertebral arteries are widely patent through the foramen magnum to the basilar. No basilar stenosis. Posterior circulation branch vessels appear patent and normal. Venous sinuses: Patent and normal. Anatomic variants: None significant. Delayed phase: No abnormal enhancement. Review of the MIP images confirms the above findings  IMPRESSION: Carotid bifurcation atherosclerosis but no stenosis or irregularity. No intracranial anterior circulation abnormality. Abnormal appearance of the proximal vertebral arteries bilaterally. I think the appearance is more consistent with advanced atherosclerotic disease, with narrowing and irregularity. I cannot exclude the possibility of vertebral artery dissection on either or both sides however. In the upper cervical region, the vertebral arteries regain a normal appearance in the vessels are widely patent through the foramen magnum to the basilar, without identifiable intracranial posterior circulation abnormality. Electronically Signed   By: Nelson Chimes M.D.   On: 05/08/2018 16:06   Ct Angio Neck W And/or Wo Contrast  Result Date: 05/08/2018 CLINICAL DATA:  Acute onset of dizziness an weakness beginning yesterday. Weakness particularly left-sided. Negative acute head CT today. EXAM: CT ANGIOGRAPHY HEAD AND NECK TECHNIQUE: Multidetector CT imaging of the head and neck was performed using the standard protocol during bolus administration of intravenous contrast. Multiplanar CT image reconstructions and MIPs were obtained to evaluate the vascular anatomy. Carotid stenosis measurements (when applicable) are obtained utilizing NASCET criteria, using the distal internal carotid diameter as the denominator. CONTRAST:  72mL ISOVUE-370 IOPAMIDOL (ISOVUE-370) INJECTION 76% COMPARISON:  Head CT earlier same day and 05/03/2012. FINDINGS: CTA NECK FINDINGS Aortic arch: Mild aortic atherosclerosis. No aneurysm or dissection. Branching pattern of the brachiocephalic vessels from the arch is normal. No origin stenosis. Right carotid system: Common carotid artery widely patent to the bifurcation region. Soft and calcified plaque at the bifurcation and ICA bulb but no stenosis. Cervical ICA tortuous but widely patent. Left carotid system: Left common carotid artery widely patent to the bifurcation region. Soft and  calcified plaque at the carotid bifurcation but no stenosis. Cervical ICA tortuous but widely patent. Vertebral arteries: Both vertebral arteries patent at their origins. Narrowing and irregularity of both proximal vertebral arteries, more severe on the right than the left. This could be due to advanced diffuse atherosclerotic disease, but dissection is not excluded on either side. More distally, the vessels appear widely patent and normal. Skeleton: Normal Other neck: No mass or lymphadenopathy. The patient does have thyroid goiter. Upper chest: Lung apices are clear and normal. Review of the MIP images confirms the above findings CTA HEAD FINDINGS Anterior circulation: Both internal carotid arteries are patent through the skull base and siphon regions without stenosis. The anterior and middle cerebral vessels are patent without proximal stenosis, aneurysm or vascular malformation. Posterior circulation: Both vertebral arteries are widely patent through the foramen magnum to the basilar. No basilar stenosis. Posterior circulation branch vessels appear patent and normal. Venous sinuses: Patent and normal. Anatomic variants: None significant. Delayed phase: No abnormal enhancement. Review of the MIP images confirms the above findings IMPRESSION: Carotid bifurcation atherosclerosis but no stenosis or irregularity. No intracranial anterior circulation abnormality. Abnormal appearance of the proximal vertebral arteries bilaterally. I think the appearance is more consistent with advanced atherosclerotic disease, with narrowing and irregularity. I cannot exclude the possibility of vertebral artery dissection on either or both sides however. In the upper cervical region, the vertebral arteries regain a normal appearance  in the vessels are widely patent through the foramen magnum to the basilar, without identifiable intracranial posterior circulation abnormality. Electronically Signed   By: Nelson Chimes M.D.   On:  05/08/2018 16:06   Mr Brain Wo Contrast  Result Date: 05/09/2018 CLINICAL DATA:  Intermittent dizziness and ataxia for a week. Left facial numbness. History of hypertension. EXAM: MRI HEAD WITHOUT CONTRAST TECHNIQUE: Multiplanar, multiecho pulse sequences of the brain and surrounding structures were obtained without intravenous contrast. COMPARISON:  CT head 05/08/2018 FINDINGS: INTRACRANIAL CONTENTS: No reduced diffusion to suggest acute ischemia. No susceptibility artifact to suggest hemorrhage. Old right caudate infarct with ex-vacuo right lateral ventricle. No overall parenchymal brain volume loss for age. A few scattered supratentorial subcentimeter T2 hyperintensities. No suspicious parenchymal signal, masses, mass effect. No abnormal extra-axial fluid collections. No extra-axial masses. VASCULAR: Normal major intracranial vascular flow voids present at skull base. SKULL AND UPPER CERVICAL SPINE: No abnormal sellar expansion. No suspicious calvarial bone marrow signal. Craniocervical junction maintained. SINUSES/ORBITS: Trace paranasal sinus mucosal thickening, small right ethmoid mucosal retention cyst. Left mastoid effusion.The included ocular globes and orbital contents are non-suspicious. OTHER: None. IMPRESSION: 1.  No acute intracranial process. 2. Old right basal ganglia infarct. Mild chronic small vessel ischemic changes. Electronically Signed   By: Elon Alas M.D.   On: 05/09/2018 00:32   Ct Head Code Stroke Wo Contrast  Result Date: 05/08/2018 CLINICAL DATA:  Code stroke. Left-sided weakness and syncopal episode yesterday which than resolved. Syncopal episode today with subsequent facial numbness. Last seen normal 2 hours ago. EXAM: CT HEAD WITHOUT CONTRAST TECHNIQUE: Contiguous axial images were obtained from the base of the skull through the vertex without intravenous contrast. COMPARISON:  CT head 05/03/2012. FINDINGS: Brain: A remote lacunar infarct of the right caudate head and  anterior limb right internal capsule is again seen. Periventricular and subcortical white matter changes are mildly advanced for age. Basal ganglia are otherwise intact. Insular cortex is normal. Ventricles are of normal size. No significant extra-axial fluid collection is present. The brainstem and cerebellum are normal. Vascular: No hyperdense vessel or unexpected calcification. Skull: Calvarium is intact. No focal lytic or blastic lesions are present. No significant extracranial soft tissue lesions are present. Sinuses/Orbits: The paranasal sinuses and right mastoid air cells are clear. Sclerotic changes of the left mastoid are stable. The globes and orbits are within normal limits. ASPECTS [facility] Stroke Program Early CT Score) - Ganglionic level infarction (caudate, lentiform nuclei, internal capsule, insula, M1-M3 cortex): 7/7 - Supraganglionic infarction (M4-M6 cortex): 3/3 Total score (0-10 with 10 being normal): 10/10 IMPRESSION: 1. No acute intracranial abnormality. 2. 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 3. Atrophy and white matter changes are mildly advanced for age. The finding is nonspecific but can be seen in the setting of chronic microvascular ischemia, a demyelinating process such as multiple sclerosis, vasculitis, complicated migraine headaches, or as the sequelae of a prior infectious or inflammatory process. 4. ASPECTS is 10/10 These results were called by telephone at the time of interpretation on 05/08/2018 at 3:11 pm to Dr. Conni Slipper , who verbally acknowledged these results. Electronically Signed   By: San Morelle M.D.   On: 05/08/2018 15:12        Scheduled Meds: . amLODipine  10 mg Oral Daily  . aspirin  325 mg Oral Daily  . atorvastatin  20 mg Oral q1800  . clopidogrel  75 mg Oral Daily  . DULoxetine  30 mg Oral BID  . gabapentin  200 mg Oral TID  . hydrochlorothiazide  25 mg Oral Daily   Continuous  Infusions: . sodium chloride 75 mL/hr at 05/09/18 0225     LOS: 1 day    Time spent: 36min    Domenic Polite, MD [facility] Page via [email or web address], password [email or web address] After 7PM please contact night-coverage  05/09/2018, 11:27 AM

## 2018-05-09 NOTE — Evaluation (Signed)
Physical Therapy Evaluation Patient Details Name: Tyrone Mcdowell MRN: [id number] DOB: Nov 21, 1956 Today's Date: 05/09/2018   History of Present Illness  62 y.o. male past medical history of hypertension, hyperlipidemia who presents with 3 episodes of dizziness, gait imbalance lasting 3 to 4 minutes since the last week. CT head  is negative and a CTA head and neck which showed abnormal appearance of the proximal vertebral arteries bilaterally.  Possible dissection was mentioned in the report and patient was transferred to [facility] for further evaluation.  Clinical Impression  Patient seen for mobility assessment. Mobilizing well. At baseline level of function per patient. No further acute PT needs. Will sign off.  OF NOTE: VSS throughout session    Follow Up Recommendations No PT follow up    Equipment Recommendations  None recommended by PT    Recommendations for Other Services       Precautions / Restrictions Precautions Precautions: None      Mobility  Bed Mobility Overal bed mobility: Independent                Transfers Overall transfer level: Independent                  Ambulation/Gait Ambulation/Gait assistance: Independent Ambulation Distance (Feet): 380 Feet Assistive device: None Gait Pattern/deviations: (increased lateral sway noted but reports baseline)     General Gait Details: steady with ambulation, good speed no difficulty  Stairs            Wheelchair Mobility    Modified Rankin (Stroke Patients Only)       Balance Overall balance assessment: Mild deficits observed, not formally tested                               Standardized Balance Assessment Standardized Balance Assessment : Dynamic Gait Index   Dynamic Gait Index Level Surface: Normal Change in Gait Speed: Normal Gait with Horizontal Head Turns: Normal Gait with Vertical Head Turns: Normal Gait and Pivot Turn: Mild Impairment Step Over  Obstacle: Mild Impairment Step Around Obstacles: Normal Steps: Mild Impairment Total Score: 21       Pertinent Vitals/Pain Pain Assessment: No/denies pain    Home Living Family/patient expects to be discharged to:: Private residence Living Arrangements: Spouse/significant other Available Help at Discharge: Family;Available PRN/intermittently Type of Home: House Home Access: Stairs to enter Entrance Stairs-Rails: None Entrance Stairs-Number of Steps: 3-4 Home Layout: Two level;Able to live on main level with bedroom/bathroom Home Equipment: Gilford Rile - 2 wheels;Cane - single point(Has access to shower seat if needed)      Prior Function Level of Independence: Independent         Comments: Does not work; on disability; drives; does [employer], broadcasting/film/video [employer]   Dominant Hand: Right    Extremity/Trunk Assessment   Upper Extremity Assessment Upper Extremity Assessment: Overall WFL for tasks assessed    Lower Extremity Assessment Lower Extremity Assessment: Overall WFL for tasks assessed    Cervical / Trunk Assessment Cervical / Trunk Assessment: Normal;Other exceptions(hx of back surgery)  Communication   Communication: No difficulties  Cognition Arousal/Alertness: Awake/alert Behavior During Therapy: WFL for tasks assessed/performed Overall Cognitive Status: Within Functional Limits for tasks assessed  General Comments      Exercises     Assessment/Plan    PT Assessment Patent does not need any further PT services  PT Problem List         PT Treatment Interventions      PT Goals (Current goals can be found in the Care Plan section)  Acute Rehab PT Goals Patient Stated Goal: to go home PT Goal Formulation: All assessment and education complete, DC therapy    Frequency     Barriers to discharge        Co-evaluation               AM-PAC PT "6 Clicks" Daily Activity  Outcome Measure  Difficulty turning over in bed (including adjusting bedclothes, sheets and blankets)?: None Difficulty moving from lying on back to sitting on the side of the bed? : None Difficulty sitting down on and standing up from a chair with arms (e.g., wheelchair, bedside commode, etc,.)?: None Help needed moving to and from a bed to chair (including a wheelchair)?: None Help needed walking in hospital room?: None Help needed climbing 3-5 steps with a railing? : A Little 6 Click Score: 23    End of Session   Activity Tolerance: Patient tolerated treatment well Patient left: in chair;with call bell/phone within reach Nurse Communication: Mobility status PT Visit Diagnosis: Other symptoms and signs involving the nervous system (R29.898)    Time: 5035-4656 PT Time Calculation (min) (ACUTE ONLY): 16 min   Charges:   PT Evaluation $PT Eval Low Complexity: 1 Low     PT G Codes:        Alben Deeds, PT DPT  Board Certified Neurologic Specialist [place] 05/09/2018, 10:26 AM

## 2018-05-09 NOTE — Progress Notes (Signed)
Occupational Therapy Evaluation Patient Details Name: Tyrone Mcdowell MRN: [id number] DOB: 11-19-1956 Today's Date: 05/09/2018    History of Present Illness 63 y.o. male past medical history of hypertension, hyperlipidemia who presents with 3 episodes of dizziness, gait imbalance lasting 3 to 4 minutes since the last week. CT head  is negative and a CTA head and neck which showed abnormal appearance of the proximal vertebral arteries bilaterally.  Possible dissection was mentioned in the report and patient was transferred to [facility] for further evaluation.   Clinical Impression   Pt appears close to baseline level of function regarding ADL and functional mobility for ADL. Educated pt on signs.symptoms of CVA using BeFAST. OT signing off.     Follow Up Recommendations  No OT follow up    Equipment Recommendations  None recommended by OT    Recommendations for Other Services       Precautions / Restrictions Precautions Precautions: None      Mobility Bed Mobility Overal bed mobility: Independent                Transfers Overall transfer level: Independent                    Balance Overall balance assessment: No apparent balance deficits (not formally assessed)(previous fall when having symptoms)                                         ADL either performed or assessed with clinical judgement   ADL Overall ADL's : Independent                                             Vision Baseline Vision/History: Wears glasses Wears Glasses: At all times Vision Assessment?: No apparent visual deficits     Perception Perception Comments: WFL   Praxis Praxis Praxis tested?: Within functional limits    Pertinent Vitals/Pain Pain Assessment: No/denies pain     Hand Dominance Right   Extremity/Trunk Assessment Upper Extremity Assessment Upper Extremity Assessment: Overall WFL for tasks assessed(mild LUE weakness  but functional)   Lower Extremity Assessment Lower Extremity Assessment: Defer to PT evaluation   Cervical / Trunk Assessment Cervical / Trunk Assessment: Normal;Other exceptions(hx of back surgery)   Communication Communication Communication: No difficulties   Cognition Arousal/Alertness: Awake/alert Behavior During Therapy: WFL for tasks assessed/performed Overall Cognitive Status: Within Functional Limits for tasks assessed                                     General Comments       Exercises     Shoulder Instructions      Home Living Family/patient expects to be discharged to:: Private residence Living Arrangements: Spouse/significant other Available Help at Discharge: Family;Available PRN/intermittently Type of Home: House Home Access: Stairs to enter [employer] of Steps: 3-4 Entrance Stairs-Rails: None Home Layout: Two level;Able to live on main level with bedroom/bathroom     Bathroom Shower/Tub: Occupational psychologist: Handicapped height Bathroom Accessibility: Yes How Accessible: Accessible via walker Home Equipment: [place] - 2 wheels;Cane - single point(Has access to shower seat if needed)  Prior Functioning/Environment Level of Independence: Independent        Comments: Does not work; on disability; drives; does Engineer, technical sales Problem List: Decreased strength;Obesity      OT Treatment/Interventions:      OT Goals(Current goals can be found in the care plan section) Acute Rehab OT Goals Patient Stated Goal: to go home OT Goal Formulation: All assessment and education complete, DC therapy  OT Frequency:     Barriers to D/C:            Co-evaluation              AM-PAC PT "6 Clicks" Daily Activity     Outcome Measure Help from another person eating meals?: None Help from another person taking care of personal grooming?: None Help from another person toileting, which includes using  toliet, bedpan, or urinal?: None Help from another person bathing (including washing, rinsing, drying)?: None Help from another person to put on and taking off regular upper body clothing?: None Help from another person to put on and taking off regular lower body clothing?: None 6 Click Score: 24   End of Session Equipment Utilized During Treatment: Gait belt Nurse Communication: Mobility status  Activity Tolerance: Patient tolerated treatment well Patient left: in chair;with call bell/phone within reach  OT Visit Diagnosis: Unsteadiness on feet (R26.81)                Time: 8502-7741 OT Time Calculation (min): 16 min Charges:  OT General Charges $OT Visit: 1 Visit OT Evaluation $OT Eval Low Complexity: 1 Low G-Codes:     Maurie Boettcher, OT/L  OT Clinical Specialist [phone]   [facility] 05/09/2018, 9:27 AM

## 2018-05-09 NOTE — Evaluation (Signed)
Speech Language Pathology Evaluation Patient Details Name: Tyrone Mcdowell MRN: [id number] DOB: 31-Jan-1956 Today's Date: 05/09/2018 Time: 4098-1191 SLP Time Calculation (min) (ACUTE ONLY): 11 min  Problem List:  Patient Active Problem List   Diagnosis Date Noted  . CVA (cerebral vascular accident) ([place]) 05/08/2018  . Fatty liver 01/12/2017  . Spondylolisthesis at L5-S1 level 04/08/2016  . Family history of colon cancer 08/21/2014  . Screening for colon cancer 06/25/2014  . Tobacco abuse counseling 12/18/2013  . Obesity 12/17/2013  . Venous insufficiency of leg 11/12/2012  . Hyperlipidemia LDL goal <100 11/12/2012  . Routine general medical examination at a health care facility 11/12/2012  . Nodular prostate with urinary retention 11/12/2012  . Hypertension 05/12/2012  . Hoarseness or changing voice 05/12/2012  . History of tobacco abuse 05/12/2012  . Chronic leg pain 05/12/2012   Past Medical History:  Past Medical History:  Diagnosis Date  . Anxiety    takes Valium as needed  . Arthritis   . Chronic back pain    spondylolisthesis  . Depression    takes Zoloft daily  . Hyperlipidemia    takes Atorvastatin daily  . Hypertension    takes Micardis and AMlodipine daily  . Joint pain   . Joint swelling   . Peripheral edema    takes Lasix daily  . Urinary frequency    takes Rapaflo daily  . Weakness    numbness and tingling   Past Surgical History:  Past Surgical History:  Procedure Laterality Date  . BACK SURGERY  04/2016  . COLONOSCOPY    . TIBIA FRACTURE SURGERY Bilateral 1989   multiple surgeries   . TIBIA HARDWARE REMOVAL  1989   HPI:  62 y.o. male past medical history of hypertension, hyperlipidemia who presents with 3 episodes of dizziness, gait imbalance lasting 3 to 4 minutes since the last week. CT head  is negative and a CTA head and neck which showed abnormal appearance of the proximal vertebral arteries bilaterally.  Possible dissection was mentioned  in the report and patient was transferred to [facility] for further evaluation.   Assessment / Plan / Recommendation Clinical Impression   Pt presents with grossly intact cognitive-linguistic function.  Cranial nerve exam was unremarkable for focal areas of weakness or decreased sensation.  Pt's speech is fluent and free from dysarthria or word finding impairment.  Cognition was [facility] for all tasks assessed as well as per pt's and wife's report.  As a result, no further ST needs are indicated at this time.      SLP Assessment  SLP Recommendation/Assessment: Patient does not need any further Speech Lanaguage Pathology Services    Follow Up Recommendations  None          SLP Evaluation Cognition  Overall Cognitive Status: Within Functional Limits for tasks assessed Orientation Level: Oriented X4       Comprehension  Auditory Comprehension Overall Auditory Comprehension: Appears within functional limits for tasks assessed    Expression Expression Primary Mode of Expression: Verbal Verbal Expression Overall Verbal Expression: Appears within functional limits for tasks assessed   Oral / Motor  Oral Motor/Sensory Function Overall Oral Motor/Sensory Function: Within functional limits Motor Speech Overall Motor Speech: Appears within functional limits for tasks assessed   GO                    Emilio Math 05/09/2018, 2:29 PM

## 2018-05-09 NOTE — Progress Notes (Signed)
  Echocardiogram 2D Echocardiogram has been performed.  Johny Chess 05/09/2018, 5:19 PM

## 2018-05-09 NOTE — Consult Note (Addendum)
Cardiology Consultation:   Patient ID: Tyrone Mcdowell; [id number]; 1956-05-05   Admit date: 05/08/2018 Date of Consult: 05/09/2018  Primary Care Provider: [name], MD Primary Cardiologist: Sanda Klein, MD  - New  Patient Profile:   Tyrone Mcdowell is a 62 y.o. male with a hx of hypertension, hyperlipidemia, former smoker who is being seen today for the evaluation of chest pain and exertional dyspnea at the request of Dr. Orbie Pyo.  History of Present Illness:   [name] presented to the [facility] ED on 05/08/2018 for evaluation of dizziness on 3 episodes in the last week, lightheadedness but no vertigo or syncope. CT and MRI of the head are negative for CVA. 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 Carotid CT showed carotid bifurcation atherosclerosis but no stenosis or irregularity. 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    Information is obtained from [name] and his wife. Mr Turgeon is on disability for bilateral leg and back issues. He had back surgery about 2 years ago and has not been exercising regularly since that time. He has no prior cardiac history and has never had any cardiac testing. He does daily chores in the home and does all of the yard work including push mowing his lawn. He denies any chest discomfort with these activities. He has noted some intermittent chest tightness, non-radiating, lasting a few minutes, not related to activity- seated or walking, that resolves when his wife tells him to relax. He has noted some dyspnea with more intense exertion over the past week which is new form him and not related to the chest tightness.   He presented for lightheadedness on 3 occasions over the last week. This has occurred while seated, driving and on Sunday it occurred while walking down his driveway. On the Sunday occasion he felt like he would pass out and went down to the ground. He did not lose  consciousness. He was able to get up but was very weak and wobbly as he walked into the house to get his wife. His symptoms were no frank dizziness like room spinning but "felt like being on a rocking ship" with loss of equilibrium.   [name] is a former smoker, smoking about a pack of cigarettes per day for 34 years, having quit at age 89. He has not been treated for hyperlipidemia. He has a positive family cardiac history in his mother having had an MI at age 54 and his maternal grandfather had a pacemaker thought to be related to bradycardia.   [name] has occasional lower extremity edema that he treats with prn lasix. This is worse when he travels. He has been taking the lasix less often than weekly. He denies orthopnea or PND. His wife states that the patient snores and she notes him stopping breathing. He has been more fatigued lately but prior to the last week he was not terribly sleepy during the day. He has never had a sleep study.  Past Medical History:  Diagnosis Date  . Anxiety    takes Valium as needed  . Arthritis   . Chronic back pain    spondylolisthesis  . Depression    takes Zoloft daily  . Hyperlipidemia    takes Atorvastatin daily  . Hypertension    takes Micardis and AMlodipine daily  . Joint pain   . Joint swelling   . Peripheral edema    takes Lasix daily  .  Urinary frequency    takes Rapaflo daily  . Weakness    numbness and tingling    Past Surgical History:  Procedure Laterality Date  . BACK SURGERY  04/2016  . COLONOSCOPY    . TIBIA FRACTURE SURGERY Bilateral 1989   multiple surgeries   . TIBIA HARDWARE REMOVAL  1989     Home Medications:  Prior to Admission medications   Medication Sig Start Date End Date Taking? Authorizing Provider  amLODipine (NORVASC) 10 MG tablet TAKE 1 TABLET BY MOUTH DAILY Patient taking differently: TAKE 1 TABLET (10MG ) BY MOUTH EVERY EVENING 02/06/18  Yes [name], MD  aspirin 81 MG tablet Take 81 mg by  mouth daily.   Yes [provider]  DULoxetine (CYMBALTA) 30 MG capsule TAKE 1 CAPSULE (30 MG TOTAL) BY MOUTH DAILY. INCREASE TO TWICE DAILY AFTER 2 WEEKS Patient taking differently: Take 30 mg by mouth 2 (two) times daily.  01/20/18  Yes [name], MD  furosemide (LASIX) 20 MG tablet TAKE 1 TABLET BY MOUTH DAILY Patient taking differently: TAKE 1 TABLET (20MG ) BY MOUTH DAILY AS NEEDED FOR SWELLING 07/15/17  Yes [name], MD  gabapentin (NEURONTIN) 100 MG capsule TAKE 2 CAPSULES BY MOUTH 3 TIMES DAILY. Patient taking differently: TAKE 2 CAPSULES (200MG ) BY MOUTH 3 TIMES DAILY. 03/22/18  Yes [name], MD  hydrochlorothiazide (HYDRODIURIL) 25 MG tablet TAKE 1 TABLET (25 MG TOTAL) BY MOUTH DAILY. 05/04/18  Yes [name], MD  tizanidine (ZANAFLEX) 6 MG capsule TAKE 1 CAPSULE BY MOUTH 3 TIMES DAILY. Patient taking differently: TAKE 1 CAPSULE (6MG ) BY MOUTH 3 TIMES DAILY AS NEEDED FOR MUSCLE SPASMS. 01/09/18  Yes [name], MD  traMADol (ULTRAM) 50 MG tablet Take 1 tablet (50 mg total) by mouth every 6 (six) hours as needed. For chronic back and leg pain 05/02/18  Yes [name], MD  valsartan (DIOVAN) 160 MG tablet TAKE 1 TABLET BY MOUTH DAILY Patient taking differently: TAKE 1 TABLET (160MG ) BY MOUTH DAILY 02/06/18  Yes [name], MD  Insulin Pen Needle (PEN NEEDLES) 31G X 6 MM MISC For use with victoza /saxenda Patient not taking: Reported on 12/13/2017 11/09/17   [name], MD  Liraglutide -Weight Management (SAXENDA) 18 MG/3ML SOPN Inject 0.6 mg into the skin daily. Increase dose weekly as follows: Week 2: 1.2 mg daily ; Week 3: 1.8 mg daily; Week 4: 2.4 mg daily Patient not taking: Reported on 12/13/2017 11/09/17   [name], MD  predniSONE (DELTASONE) 10 MG tablet 6 tabletsdaily for 3 days,  then reduce by 1 tablet daily until gone Patient not taking: Reported on 12/13/2017 11/07/17   [name], MD  sertraline (ZOLOFT) 100 MG tablet TAKE 1  TABLET BY MOUTH ONCE DAILY Patient not taking: Reported on 12/13/2017 04/15/17   [name], MD    Inpatient Medications: Scheduled Meds: . amLODipine  10 mg Oral Daily  . aspirin  325 mg Oral Daily  . atorvastatin  20 mg Oral q1800  . clopidogrel  75 mg Oral Daily  . DULoxetine  30 mg Oral BID  . gabapentin  200 mg Oral TID  . hydrochlorothiazide  25 mg Oral Daily   Continuous Infusions: . sodium chloride Stopped (05/09/18 1323)   PRN Meds: acetaminophen **OR** acetaminophen (TYLENOL) oral liquid 160 mg/5 mL **OR** acetaminophen, senna-docusate, traMADol  Allergies:   No Known Allergies  Social History:   Social History  Socioeconomic History  . Marital status: Married    Spouse name: Not on file  . Number of children: Not on file  . Years of education: Not on file  . Highest education level: Not on file  Occupational History  . Not on file  Social Needs  . Financial resource strain: Not on file  . Food insecurity:    Worry: Not on file    Inability: Not on file  . Transportation needs:    Medical: Not on file    Non-medical: Not on file  Tobacco Use  . Smoking status: Former Smoker    Packs/day: 0.00    Years: 34.00    Pack years: 0.00    Types: Cigarettes    Start date: 1979    Last attempt to quit: 2015    Years since quitting: 4.4  . Smokeless tobacco: Current User    Types: Chew  . Tobacco comment: 34 year pack history  Substance and Sexual Activity  . Alcohol use: No    Alcohol/week: 0.0 oz    Comment: no alcohol in over a yr  . Drug use: No  . Sexual activity: Not Currently  Lifestyle  . Physical activity:    Days per week: Not on file    Minutes per session: Not on file  . Stress: Not on file  Relationships  . Social connections:    Talks on phone: Not on file    Gets together: Not on file    Attends religious service: Not on file    Active member of club or organization: Not on file    Attends meetings of clubs or organizations: Not  on file    Relationship status: Not on file  . Intimate partner violence:    Fear of current or ex partner: Not on file    Emotionally abused: Not on file    Physically abused: Not on file    Forced sexual activity: Not on file  Other Topics Concern  . Not on file  Social History Narrative   Mr. Aguilera is married with 2 grown children and 2 grandchildren. He is on disability for back and leg pain.     Family History:    Family History  Problem Relation Age of Onset  . Cancer Mother        lung Ca, tobacco abuse  . CAD Mother        MI at age 12  . Stroke Father   . Hypertension Father   . Heart disease Father        pacemaker, heart failure  . Kidney disease Father   . Ulcerative colitis Father   . Cancer Sister 37       colon CA  . Cancer Other        colon CA at age 35  . Ulcerative colitis Brother      ROS:  Please see the history of present illness.  All other ROS reviewed and negative.     Physical Exam/Data:   Vitals:   05/08/18 2100 05/09/18 0614 05/09/18 0840 05/09/18 1422  BP: (!) 184/104 (!) 170/92 (!) 169/85 (!) 172/98  Pulse: (!) 56 65 (!) 59 61  Resp:   20 18  Temp:  98 F (36.7 C) 98.7 F (37.1 C) 98.7 F (37.1 C)  TempSrc:  Oral Oral Oral  SpO2: 98% 97% 97% 94%    Intake/Output Summary (Last 24 hours) at 05/09/2018 1439 Last data filed at 05/09/2018 1005 Gross  per 24 hour  Intake 508.75 ml  Output -  Net 508.75 ml   There were no vitals filed for this visit. There is no height or weight on file to calculate BMI.  General:  Well nourished, well developed, in no acute distress HEENT: normal Lymph: no adenopathy Neck: no JVD Endocrine:  No thryomegaly Vascular: No carotid bruits; FA pulses 2+ bilaterally without bruits  Cardiac:  normal S1, S2; RRR; no murmur  Lungs:  clear to auscultation bilaterally, no wheezing, rhonchi or rales  Abd: soft, nontender, no hepatomegaly  Ext: no edema. Old scarring noted on lower legs.  Musculoskeletal:   No deformities, BUE and BLE strength normal and equal Skin: warm and dry  Neuro:  CNs 2-12 intact, no focal abnormalities noted Psych:  Normal affect   EKG:  The EKG was personally reviewed and demonstrates:  Sinus bradycardia at 58 bpm with early r wave progression, possible LVH Telemetry:  Personally reviewed: sinus bradycardia during the night with rates in the upper 50's, sinus rhythm while awake with rates 60's-80's. No arrhythmias noted  Relevant CV Studies:  Echocardiogram pending  Laboratory Data:  Chemistry Recent Labs  Lab 05/08/18 1430  NA 137  K 3.5  CL 98*  CO2 30  GLUCOSE 90  BUN 18  CREATININE 1.00  CALCIUM 9.6  GFRNONAA >60  GFRAA >60  ANIONGAP 9    No results for input(s): PROT, ALBUMIN, AST, ALT, ALKPHOS, BILITOT in the last 168 hours. Hematology Recent Labs  Lab 05/08/18 1430  WBC 6.9  RBC 4.50  HGB 13.6  HCT 40.5  MCV 89.9  MCH 30.3  MCHC 33.7  RDW 13.7  PLT 325   Cardiac Enzymes Recent Labs  Lab 05/08/18 1430 05/08/18 2109  TROPONINI <0.03 <0.03   No results for input(s): TROPIPOC in the last 168 hours.  BNPNo results for input(s): BNP, PROBNP in the last 168 hours.  DDimer No results for input(s): DDIMER in the last 168 hours.  Radiology/Studies:  Ct Angio Head W Or Wo Contrast  Result Date: 05/08/2018 CLINICAL DATA:  Acute onset of dizziness an weakness beginning yesterday. Weakness particularly left-sided. Negative acute head CT today. EXAM: CT ANGIOGRAPHY HEAD AND NECK TECHNIQUE: Multidetector CT imaging of the head and neck was performed using the standard protocol during bolus administration of intravenous contrast. Multiplanar CT image reconstructions and MIPs were obtained to evaluate the vascular anatomy. Carotid stenosis measurements (when applicable) are obtained utilizing NASCET criteria, using the distal internal carotid diameter as the denominator. CONTRAST:  24mL ISOVUE-370 IOPAMIDOL (ISOVUE-370) INJECTION 76% COMPARISON:   Head CT earlier same day and 05/03/2012. FINDINGS: CTA NECK FINDINGS Aortic arch: Mild aortic atherosclerosis. No aneurysm or dissection. Branching pattern of the brachiocephalic vessels from the arch is normal. No origin stenosis. Right carotid system: Common carotid artery widely patent to the bifurcation region. Soft and calcified plaque at the bifurcation and ICA bulb but no stenosis. Cervical ICA tortuous but widely patent. Left carotid system: Left common carotid artery widely patent to the bifurcation region. Soft and calcified plaque at the carotid bifurcation but no stenosis. Cervical ICA tortuous but widely patent. Vertebral arteries: Both vertebral arteries patent at their origins. Narrowing and irregularity of both proximal vertebral arteries, more severe on the right than the left. This could be due to advanced diffuse atherosclerotic disease, but dissection is not excluded on either side. More distally, the vessels appear widely patent and normal. Skeleton: Normal Other neck: No mass or lymphadenopathy. The patient does  have thyroid goiter. Upper chest: Lung apices are clear and normal. Review of the MIP images confirms the above findings CTA HEAD FINDINGS Anterior circulation: Both internal carotid arteries are patent through the skull base and siphon regions without stenosis. The anterior and middle cerebral vessels are patent without proximal stenosis, aneurysm or vascular malformation. Posterior circulation: Both vertebral arteries are widely patent through the foramen magnum to the basilar. No basilar stenosis. Posterior circulation branch vessels appear patent and normal. Venous sinuses: Patent and normal. Anatomic variants: None significant. Delayed phase: No abnormal enhancement. Review of the MIP images confirms the above findings IMPRESSION: Carotid bifurcation atherosclerosis but no stenosis or irregularity. No intracranial anterior circulation abnormality. Abnormal appearance of the  proximal vertebral arteries bilaterally. I think the appearance is more consistent with advanced atherosclerotic disease, with narrowing and irregularity. I cannot exclude the possibility of vertebral artery dissection on either or both sides however. In the upper cervical region, the vertebral arteries regain a normal appearance in the vessels are widely patent through the foramen magnum to the basilar, without identifiable intracranial posterior circulation abnormality. Electronically Signed   By: Nelson Chimes M.D.   On: 05/08/2018 16:06   Ct Angio Neck W And/or Wo Contrast  Result Date: 05/08/2018 CLINICAL DATA:  Acute onset of dizziness an weakness beginning yesterday. Weakness particularly left-sided. Negative acute head CT today. EXAM: CT ANGIOGRAPHY HEAD AND NECK TECHNIQUE: Multidetector CT imaging of the head and neck was performed using the standard protocol during bolus administration of intravenous contrast. Multiplanar CT image reconstructions and MIPs were obtained to evaluate the vascular anatomy. Carotid stenosis measurements (when applicable) are obtained utilizing NASCET criteria, using the distal internal carotid diameter as the denominator. CONTRAST:  45mL ISOVUE-370 IOPAMIDOL (ISOVUE-370) INJECTION 76% COMPARISON:  Head CT earlier same day and 05/03/2012. FINDINGS: CTA NECK FINDINGS Aortic arch: Mild aortic atherosclerosis. No aneurysm or dissection. Branching pattern of the brachiocephalic vessels from the arch is normal. No origin stenosis. Right carotid system: Common carotid artery widely patent to the bifurcation region. Soft and calcified plaque at the bifurcation and ICA bulb but no stenosis. Cervical ICA tortuous but widely patent. Left carotid system: Left common carotid artery widely patent to the bifurcation region. Soft and calcified plaque at the carotid bifurcation but no stenosis. Cervical ICA tortuous but widely patent. Vertebral arteries: Both vertebral arteries patent at  their origins. Narrowing and irregularity of both proximal vertebral arteries, more severe on the right than the left. This could be due to advanced diffuse atherosclerotic disease, but dissection is not excluded on either side. More distally, the vessels appear widely patent and normal. Skeleton: Normal Other neck: No mass or lymphadenopathy. The patient does have thyroid goiter. Upper chest: Lung apices are clear and normal. Review of the MIP images confirms the above findings CTA HEAD FINDINGS Anterior circulation: Both internal carotid arteries are patent through the skull base and siphon regions without stenosis. The anterior and middle cerebral vessels are patent without proximal stenosis, aneurysm or vascular malformation. Posterior circulation: Both vertebral arteries are widely patent through the foramen magnum to the basilar. No basilar stenosis. Posterior circulation branch vessels appear patent and normal. Venous sinuses: Patent and normal. Anatomic variants: None significant. Delayed phase: No abnormal enhancement. Review of the MIP images confirms the above findings IMPRESSION: Carotid bifurcation atherosclerosis but no stenosis or irregularity. No intracranial anterior circulation abnormality. Abnormal appearance of the proximal vertebral arteries bilaterally. I think the appearance is more consistent with advanced atherosclerotic disease, with narrowing  and irregularity. I cannot exclude the possibility of vertebral artery dissection on either or both sides however. In the upper cervical region, the vertebral arteries regain a normal appearance in the vessels are widely patent through the foramen magnum to the basilar, without identifiable intracranial posterior circulation abnormality. Electronically Signed   By: Nelson Chimes M.D.   On: 05/08/2018 16:06   Mr Brain Wo Contrast  Result Date: 05/09/2018 CLINICAL DATA:  Intermittent dizziness and ataxia for a week. Left facial numbness. History of  hypertension. EXAM: MRI HEAD WITHOUT CONTRAST TECHNIQUE: Multiplanar, multiecho pulse sequences of the brain and surrounding structures were obtained without intravenous contrast. COMPARISON:  CT head 05/08/2018 FINDINGS: INTRACRANIAL CONTENTS: No reduced diffusion to suggest acute ischemia. No susceptibility artifact to suggest hemorrhage. Old right caudate infarct with ex-vacuo right lateral ventricle. No overall parenchymal brain volume loss for age. A few scattered supratentorial subcentimeter T2 hyperintensities. No suspicious parenchymal signal, masses, mass effect. No abnormal extra-axial fluid collections. No extra-axial masses. VASCULAR: Normal major intracranial vascular flow voids present at skull base. SKULL AND UPPER CERVICAL SPINE: No abnormal sellar expansion. No suspicious calvarial bone marrow signal. Craniocervical junction maintained. SINUSES/ORBITS: Trace paranasal sinus mucosal thickening, small right ethmoid mucosal retention cyst. Left mastoid effusion.The included ocular globes and orbital contents are non-suspicious. OTHER: None. IMPRESSION: 1.  No acute intracranial process. 2. Old right basal ganglia infarct. Mild chronic small vessel ischemic changes. Electronically Signed   By: Elon Alas M.D.   On: 05/09/2018 00:32   Ct Head Code Stroke Wo Contrast  Result Date: 05/08/2018 CLINICAL DATA:  Code stroke. Left-sided weakness and syncopal episode yesterday which than resolved. Syncopal episode today with subsequent facial numbness. Last seen normal 2 hours ago. EXAM: CT HEAD WITHOUT CONTRAST TECHNIQUE: Contiguous axial images were obtained from the base of the skull through the vertex without intravenous contrast. COMPARISON:  CT head 05/03/2012. FINDINGS: Brain: A remote lacunar infarct of the right caudate head and anterior limb right internal capsule is again seen. Periventricular and subcortical white matter changes are mildly advanced for age. Basal ganglia are otherwise intact.  Insular cortex is normal. Ventricles are of normal size. No significant extra-axial fluid collection is present. The brainstem and cerebellum are normal. Vascular: No hyperdense vessel or unexpected calcification. Skull: Calvarium is intact. No focal lytic or blastic lesions are present. No significant extracranial soft tissue lesions are present. Sinuses/Orbits: The paranasal sinuses and right mastoid air cells are clear. Sclerotic changes of the left mastoid are stable. The globes and orbits are within normal limits. ASPECTS [facility] Stroke Program Early CT Score) - Ganglionic level infarction (caudate, lentiform nuclei, internal capsule, insula, M1-M3 cortex): 7/7 - Supraganglionic infarction (M4-M6 cortex): 3/3 Total score (0-10 with 10 being normal): 10/10 IMPRESSION: 1. No acute intracranial abnormality. 2. 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 3. Atrophy and white matter changes are mildly advanced for age. The finding is nonspecific but can be seen in the setting of chronic microvascular ischemia, a demyelinating process such as multiple sclerosis, vasculitis, complicated migraine headaches, or as the sequelae of a prior infectious or inflammatory process. 4. ASPECTS is 10/10 These results were called by telephone at the time of interpretation on 05/08/2018 at 3:11 pm to Dr. Conni Slipper , who verbally acknowledged these results. Electronically Signed   By: San Morelle M.D.   On: 05/08/2018 15:12    Assessment and Plan:   Chest pain -Intermittent chest tightness not related  to activity over the last week. Also has dyspnea on exertion for the last week, not concurrent with chest discomfort.  -CVD risk factors include hypertension, hyperlipidemia, former smoker, obesity, family history -Troponins negative X 2. EKG without acute ischemic changes. -This patient has a high pretest probability of CAD -Echocardiogram ordered and will evaluate  for LV function, wall motion and cardiac structure.  -Plan for ischemic testing, coronary CTA vs nuclear stress test, can be done as an oupatient -If echo normal, can discharge today from cardiac standpoint. I will arrange for outpatient testing and follow up.   Lightheadedness -Pt with several episodes of lightheadedness and near syncope over the last week. He had a brief episode this am while up at the sink. -No acute stroke on CT and MRI of the head.  -Differentials include arrhythmia, orthostatic hypotension, valvular/structural abnormalities, myocardial ischemia -Echocardiogram has been ordered -No palpitations -No arrhythmias so far on telemetry -Will plan for outpatient event monitor.  -Will check orthostatic VS.  Hypertension -Home meds include valsartan 160 mg daily, amlodipine 10 mg daily, hydrochlorothiazide 25 mg daily -Not on ARB while hospitalized. BP is elevated. Has just received his daily amlodipine and HCTZ.  -Would resume his ARB and Continue to Monitor BP.  Hyperlipidemia -LDL 113 (was 145 in 11/2017). Atorvastatin 20 mg has been added. Goal LDL <100. Will need follow up lipid panel in 6-8 weeks.   Tobacco history -Pt has a 34 pack year history of smoking cigarettes. Cessation 5 years ago. Encouraged continued cessation.   Old CVA -Incidental finding on MRI of old right basal ganglia infarct as well as chronic small vessel ischemic changes.  -Aspirin/statin/Plavix have been initiated. Per neurology continue aspirin 325 mg and Plavix 75 mg daily for 3 months then aspirin alone.  Suspected obstructive sleep apnea -Wife notes snoring and that pt stops breathing in his sleep. No excessive daytime sleepiness but recent increased fatigue. Pt is obese with body habitus making sleep apnea likely. -Advise for outpatient sleep study to evaluate for obstructive sleep apnea.   Obesity -BMI 39.75 kg/m2. Advise weight loss with diet and exercise. Exercise may have to be  modified due to leg and back pain, like water exercise. He should try for at least a 50 lb wt loss. More would be ideal.  -Pt was prescribed liragluatide for wt loss but not covered by insurance and is cost prohibitive.   For questions or updates, please contact [place] Please consult [email or web address] for contact info under Cardiology/STEMI.   Signed, Daune Perch, NP  05/09/2018 2:39 PM  I have seen and examined the patient along with Daune Perch, NP.  I have reviewed the chart, notes and new data.  I agree with PA/NP's note.  Key new complaints: His instability/dysequilibrium symptoms sound more neurological than cardiac. They are not consistent with orthostatic hypotension. Arrhythmia is a possibility, will order an event monitor. The chest pain syndrome is also atypical, not exertion-related. However, he does have a compelling risk factor profile. He has many symptoms of obstructive sleep apnea. Key examination changes: Obese, almost morbidly obese; varicose veins. Otherwise normal CV exam. Key new findings / data: normal ECG (early R/S transition V2 could be a normal variant), without repolarization abnormalities. Normal cardiac enzymes. Echo pending. CT shows old lacunar infarct (since 2013).  PLAN: Outpatient event monitor. (will schedule) Outpatient coronary CTA. Outpatient sleep study. Ready for DC unless echo shows an unexpected structural abnormality. Weight loss very important for long-term prognosis.  Sanda Klein, MD, [facility]  [facility] [phone] 05/09/2018, 3:26 PM

## 2018-05-10 DIAGNOSIS — I6503 Occlusion and stenosis of bilateral vertebral arteries: Secondary | ICD-10-CM

## 2018-05-10 LAB — ECHOCARDIOGRAM COMPLETE
Height: 70 in
WEIGHTICAEL: 4432 [oz_av]

## 2018-05-10 MED ORDER — ATORVASTATIN CALCIUM 20 MG PO TABS: 20.0000 mg | ORAL_TABLET | Freq: Every day | ORAL | 0 refills | Status: DC

## 2018-05-10 MED ORDER — CLOPIDOGREL BISULFATE 75 MG PO TABS: 75.0000 mg | ORAL_TABLET | Freq: Every day | ORAL | 0 refills | Status: DC

## 2018-05-10 MED ORDER — ASPIRIN 325 MG PO TABS: 325.0000 mg | ORAL_TABLET | Freq: Every day | ORAL | 0 refills | Status: DC

## 2018-05-10 NOTE — Progress Notes (Signed)
Progress Note  Patient Name: Tyrone Mcdowell Date of Encounter: 05/10/2018  Primary Cardiologist: Sanda Klein, MD   Subjective   Feels well, no new events.  Normal rhythm on the monitor overnight. Echo reviewed, normal findings.  Regional wall motion of normality, normal left ventricular systolic and diastolic function.  Borderline pulmonary artery pressure.  Inpatient Medications    Scheduled Meds: . amLODipine  10 mg Oral Daily  . aspirin  325 mg Oral Daily  . atorvastatin  20 mg Oral q1800  . clopidogrel  75 mg Oral Daily  . DULoxetine  30 mg Oral BID  . gabapentin  200 mg Oral TID  . hydrochlorothiazide  25 mg Oral Daily   Continuous Infusions: . sodium chloride Stopped (05/09/18 1323)   PRN Meds: acetaminophen **OR** acetaminophen (TYLENOL) oral liquid 160 mg/5 mL **OR** acetaminophen, senna-docusate, traMADol   Vital Signs    Vitals:   05/09/18 0614 05/09/18 0840 05/09/18 1422 05/09/18 2114  BP: (!) 170/92 (!) 169/85 (!) 172/98 (!) 153/98  Pulse: 65 (!) 59 61 74  Resp:  20 18 16   Temp: 98 F (36.7 C) 98.7 F (37.1 C) 98.7 F (37.1 C) 98.5 F (36.9 C)  TempSrc: Oral Oral Oral Oral  SpO2: 97% 97% 94% 96%    Intake/Output Summary (Last 24 hours) at 05/10/2018 1113 Last data filed at 05/10/2018 1008 Gross per 24 hour  Intake 700 ml  Output -  Net 700 ml   There were no vitals filed for this visit.  Telemetry    Normal sinus rhythm- Personally Reviewed  ECG    No new tracing- Personally Reviewed  Physical Exam  Obese GEN: No acute distress.   Neck: No JVD Cardiac: RRR, no murmurs, rubs, or gallops.  Respiratory: Clear to auscultation bilaterally. GI: Soft, nontender, non-distended  MS: No edema; No deformity. Neuro:  Nonfocal  Psych: Normal affect   Labs    Chemistry Recent Labs  Lab 05/08/18 1430  NA 137  K 3.5  CL 98*  CO2 30  GLUCOSE 90  BUN 18  CREATININE 1.00  CALCIUM 9.6  GFRNONAA >60  GFRAA >60  ANIONGAP 9      Hematology Recent Labs  Lab 05/08/18 1430  WBC 6.9  RBC 4.50  HGB 13.6  HCT 40.5  MCV 89.9  MCH 30.3  MCHC 33.7  RDW 13.7  PLT 325    Cardiac Enzymes Recent Labs  Lab 05/08/18 1430 05/08/18 2109  TROPONINI <0.03 <0.03   No results for input(s): TROPIPOC in the last 168 hours.   BNPNo results for input(s): BNP, PROBNP in the last 168 hours.   DDimer No results for input(s): DDIMER in the last 168 hours.   Radiology    Ct Angio Head W Or Wo Contrast  Result Date: 05/08/2018 CLINICAL DATA:  Acute onset of dizziness an weakness beginning yesterday. Weakness particularly left-sided. Negative acute head CT today. EXAM: CT ANGIOGRAPHY HEAD AND NECK TECHNIQUE: Multidetector CT imaging of the head and neck was performed using the standard protocol during bolus administration of intravenous contrast. Multiplanar CT image reconstructions and MIPs were obtained to evaluate the vascular anatomy. Carotid stenosis measurements (when applicable) are obtained utilizing NASCET criteria, using the distal internal carotid diameter as the denominator. CONTRAST:  80mL ISOVUE-370 IOPAMIDOL (ISOVUE-370) INJECTION 76% COMPARISON:  Head CT earlier same day and 05/03/2012. FINDINGS: CTA NECK FINDINGS Aortic arch: Mild aortic atherosclerosis. No aneurysm or dissection. Branching pattern of the brachiocephalic vessels from the arch is  normal. No origin stenosis. Right carotid system: Common carotid artery widely patent to the bifurcation region. Soft and calcified plaque at the bifurcation and ICA bulb but no stenosis. Cervical ICA tortuous but widely patent. Left carotid system: Left common carotid artery widely patent to the bifurcation region. Soft and calcified plaque at the carotid bifurcation but no stenosis. Cervical ICA tortuous but widely patent. Vertebral arteries: Both vertebral arteries patent at their origins. Narrowing and irregularity of both proximal vertebral arteries, more severe on the right  than the left. This could be due to advanced diffuse atherosclerotic disease, but dissection is not excluded on either side. More distally, the vessels appear widely patent and normal. Skeleton: Normal Other neck: No mass or lymphadenopathy. The patient does have thyroid goiter. Upper chest: Lung apices are clear and normal. Review of the MIP images confirms the above findings CTA HEAD FINDINGS Anterior circulation: Both internal carotid arteries are patent through the skull base and siphon regions without stenosis. The anterior and middle cerebral vessels are patent without proximal stenosis, aneurysm or vascular malformation. Posterior circulation: Both vertebral arteries are widely patent through the foramen magnum to the basilar. No basilar stenosis. Posterior circulation branch vessels appear patent and normal. Venous sinuses: Patent and normal. Anatomic variants: None significant. Delayed phase: No abnormal enhancement. Review of the MIP images confirms the above findings IMPRESSION: Carotid bifurcation atherosclerosis but no stenosis or irregularity. No intracranial anterior circulation abnormality. Abnormal appearance of the proximal vertebral arteries bilaterally. I think the appearance is more consistent with advanced atherosclerotic disease, with narrowing and irregularity. I cannot exclude the possibility of vertebral artery dissection on either or both sides however. In the upper cervical region, the vertebral arteries regain a normal appearance in the vessels are widely patent through the foramen magnum to the basilar, without identifiable intracranial posterior circulation abnormality. Electronically Signed   By: Nelson Chimes M.D.   On: 05/08/2018 16:06   Ct Angio Neck W And/or Wo Contrast  Result Date: 05/08/2018 CLINICAL DATA:  Acute onset of dizziness an weakness beginning yesterday. Weakness particularly left-sided. Negative acute head CT today. EXAM: CT ANGIOGRAPHY HEAD AND NECK TECHNIQUE:  Multidetector CT imaging of the head and neck was performed using the standard protocol during bolus administration of intravenous contrast. Multiplanar CT image reconstructions and MIPs were obtained to evaluate the vascular anatomy. Carotid stenosis measurements (when applicable) are obtained utilizing NASCET criteria, using the distal internal carotid diameter as the denominator. CONTRAST:  66mL ISOVUE-370 IOPAMIDOL (ISOVUE-370) INJECTION 76% COMPARISON:  Head CT earlier same day and 05/03/2012. FINDINGS: CTA NECK FINDINGS Aortic arch: Mild aortic atherosclerosis. No aneurysm or dissection. Branching pattern of the brachiocephalic vessels from the arch is normal. No origin stenosis. Right carotid system: Common carotid artery widely patent to the bifurcation region. Soft and calcified plaque at the bifurcation and ICA bulb but no stenosis. Cervical ICA tortuous but widely patent. Left carotid system: Left common carotid artery widely patent to the bifurcation region. Soft and calcified plaque at the carotid bifurcation but no stenosis. Cervical ICA tortuous but widely patent. Vertebral arteries: Both vertebral arteries patent at their origins. Narrowing and irregularity of both proximal vertebral arteries, more severe on the right than the left. This could be due to advanced diffuse atherosclerotic disease, but dissection is not excluded on either side. More distally, the vessels appear widely patent and normal. Skeleton: Normal Other neck: No mass or lymphadenopathy. The patient does have thyroid goiter. Upper chest: Lung apices are clear and normal. Review  of the MIP images confirms the above findings CTA HEAD FINDINGS Anterior circulation: Both internal carotid arteries are patent through the skull base and siphon regions without stenosis. The anterior and middle cerebral vessels are patent without proximal stenosis, aneurysm or vascular malformation. Posterior circulation: Both vertebral arteries are widely  patent through the foramen magnum to the basilar. No basilar stenosis. Posterior circulation branch vessels appear patent and normal. Venous sinuses: Patent and normal. Anatomic variants: None significant. Delayed phase: No abnormal enhancement. Review of the MIP images confirms the above findings IMPRESSION: Carotid bifurcation atherosclerosis but no stenosis or irregularity. No intracranial anterior circulation abnormality. Abnormal appearance of the proximal vertebral arteries bilaterally. I think the appearance is more consistent with advanced atherosclerotic disease, with narrowing and irregularity. I cannot exclude the possibility of vertebral artery dissection on either or both sides however. In the upper cervical region, the vertebral arteries regain a normal appearance in the vessels are widely patent through the foramen magnum to the basilar, without identifiable intracranial posterior circulation abnormality. Electronically Signed   By: Nelson Chimes M.D.   On: 05/08/2018 16:06   Mr Brain Wo Contrast  Result Date: 05/09/2018 CLINICAL DATA:  Intermittent dizziness and ataxia for a week. Left facial numbness. History of hypertension. EXAM: MRI HEAD WITHOUT CONTRAST TECHNIQUE: Multiplanar, multiecho pulse sequences of the brain and surrounding structures were obtained without intravenous contrast. COMPARISON:  CT head 05/08/2018 FINDINGS: INTRACRANIAL CONTENTS: No reduced diffusion to suggest acute ischemia. No susceptibility artifact to suggest hemorrhage. Old right caudate infarct with ex-vacuo right lateral ventricle. No overall parenchymal brain volume loss for age. A few scattered supratentorial subcentimeter T2 hyperintensities. No suspicious parenchymal signal, masses, mass effect. No abnormal extra-axial fluid collections. No extra-axial masses. VASCULAR: Normal major intracranial vascular flow voids present at skull base. SKULL AND UPPER CERVICAL SPINE: No abnormal sellar expansion. No suspicious  calvarial bone marrow signal. Craniocervical junction maintained. SINUSES/ORBITS: Trace paranasal sinus mucosal thickening, small right ethmoid mucosal retention cyst. Left mastoid effusion.The included ocular globes and orbital contents are non-suspicious. OTHER: None. IMPRESSION: 1.  No acute intracranial process. 2. Old right basal ganglia infarct. Mild chronic small vessel ischemic changes. Electronically Signed   By: Elon Alas M.D.   On: 05/09/2018 00:32   Ct Head Code Stroke Wo Contrast  Result Date: 05/08/2018 CLINICAL DATA:  Code stroke. Left-sided weakness and syncopal episode yesterday which than resolved. Syncopal episode today with subsequent facial numbness. Last seen normal 2 hours ago. EXAM: CT HEAD WITHOUT CONTRAST TECHNIQUE: Contiguous axial images were obtained from the base of the skull through the vertex without intravenous contrast. COMPARISON:  CT head 05/03/2012. FINDINGS: Brain: A remote lacunar infarct of the right caudate head and anterior limb right internal capsule is again seen. Periventricular and subcortical white matter changes are mildly advanced for age. Basal ganglia are otherwise intact. Insular cortex is normal. Ventricles are of normal size. No significant extra-axial fluid collection is present. The brainstem and cerebellum are normal. Vascular: No hyperdense vessel or unexpected calcification. Skull: Calvarium is intact. No focal lytic or blastic lesions are present. No significant extracranial soft tissue lesions are present. Sinuses/Orbits: The paranasal sinuses and right mastoid air cells are clear. Sclerotic changes of the left mastoid are stable. The globes and orbits are within normal limits. ASPECTS [facility] Stroke Program Early CT Score) - Ganglionic level infarction (caudate, lentiform nuclei, internal capsule, insula, M1-M3 cortex): 7/7 - Supraganglionic infarction (M4-M6 cortex): 3/3 Total score (0-10 with 10 being normal): 10/10 IMPRESSION: 1.  No acute  intracranial abnormality. 2. 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 3. Atrophy and white matter changes are mildly advanced for age. The finding is nonspecific but can be seen in the setting of chronic microvascular ischemia, a demyelinating process such as multiple sclerosis, vasculitis, complicated migraine headaches, or as the sequelae of a prior infectious or inflammatory process. 4. ASPECTS is 10/10 These results were called by telephone at the time of interpretation on 05/08/2018 at 3:11 pm to Dr. Conni Slipper , who verbally acknowledged these results. Electronically Signed   By: San Morelle M.D.   On: 05/08/2018 15:12    Cardiac Studies   ECHO Yesterday Study Conclusions  - Left ventricle: The cavity size was normal. Systolic function was   normal. The estimated ejection fraction was in the range of 60%   to 65%. Wall motion was normal; there were no regional wall   motion abnormalities. Left ventricular diastolic function   parameters were normal.   Patient Profile     62 y.o. male with atypical episodes of disequilibrium, possible near syncope and atypical chest pain, obesity, suspected sleep apnea, multiple risk factors for cardiac illness.  Assessment & Plan    Ready for discharge from cardiology point of view.  Have scheduled outpatient evaluation with event monitor, coronary CT angiogram and sleep study.  For questions or updates, please contact [place] Please consult [email or web address] for contact info under Cardiology/STEMI.      Signed, Sanda Klein, MD  05/10/2018, 11:13 AM

## 2018-05-10 NOTE — Progress Notes (Signed)
Pt discharged to home. AVS and scripts given, discharge instructions discussed with patient and spouse. IV and tele pack removed, pt in stable condition. Pt will follow up with PCP, cardiology, and neurology.

## 2018-05-10 NOTE — Progress Notes (Addendum)
STROKE TEAM PROGRESS NOTE   INTERVAL HISTORY Wife at bedside. Pt up dressed in chair. No new events since yesterday. Cardiology has full workup planned as OP. Anticipates d/c today.  Vitals:   05/09/18 0614 05/09/18 0840 05/09/18 1422 05/09/18 2114  BP: (!) 170/92 (!) 169/85 (!) 172/98 (!) 153/98  Pulse: 65 (!) 59 61 74  Resp:  20 18 16   Temp: 98 F (36.7 C) 98.7 F (37.1 C) 98.7 F (37.1 C) 98.5 F (36.9 C)  TempSrc: Oral Oral Oral Oral  SpO2: 97% 97% 94% 96%    CBC:  Recent Labs  Lab 05/08/18 1430  WBC 6.9  NEUTROABS 3.7  HGB 13.6  HCT 40.5  MCV 89.9  PLT 517    Basic Metabolic Panel:  Recent Labs  Lab 05/08/18 1430  NA 137  K 3.5  CL 98*  CO2 30  GLUCOSE 90  BUN 18  CREATININE 1.00  CALCIUM 9.6   Lipid Panel:     Component Value Date/Time   CHOL 170 05/09/2018 0342   TRIG 111 05/09/2018 0342   HDL 35 (L) 05/09/2018 0342   CHOLHDL 4.9 05/09/2018 0342   VLDL 22 05/09/2018 0342   LDLCALC 113 (H) 05/09/2018 0342   HgbA1c:  Lab Results  Component Value Date   HGBA1C 5.5 05/09/2018   Urine Drug Screen:     Component Value Date/Time   LABOPIA NONE DETECTED 05/09/2018 0231   COCAINSCRNUR NONE DETECTED 05/09/2018 0231   COCAINSCRNUR NONE DETECTED 05/08/2018 1434   LABBENZ NONE DETECTED 05/09/2018 0231   AMPHETMU NONE DETECTED 05/09/2018 0231   THCU NONE DETECTED 05/09/2018 0231   LABBARB NONE DETECTED 05/09/2018 0231    Alcohol Level No results found for: ETH  IMAGING Ct Angio Head W Or Wo Contrast  Result Date: 05/08/2018 CLINICAL DATA:  Acute onset of dizziness an weakness beginning yesterday. Weakness particularly left-sided. Negative acute head CT today. EXAM: CT ANGIOGRAPHY HEAD AND NECK TECHNIQUE: Multidetector CT imaging of the head and neck was performed using the standard protocol during bolus administration of intravenous contrast. Multiplanar CT image reconstructions and MIPs were obtained to evaluate the vascular anatomy. Carotid  stenosis measurements (when applicable) are obtained utilizing NASCET criteria, using the distal internal carotid diameter as the denominator. CONTRAST:  29mL ISOVUE-370 IOPAMIDOL (ISOVUE-370) INJECTION 76% COMPARISON:  Head CT earlier same day and 05/03/2012. FINDINGS: CTA NECK FINDINGS Aortic arch: Mild aortic atherosclerosis. No aneurysm or dissection. Branching pattern of the brachiocephalic vessels from the arch is normal. No origin stenosis. Right carotid system: Common carotid artery widely patent to the bifurcation region. Soft and calcified plaque at the bifurcation and ICA bulb but no stenosis. Cervical ICA tortuous but widely patent. Left carotid system: Left common carotid artery widely patent to the bifurcation region. Soft and calcified plaque at the carotid bifurcation but no stenosis. Cervical ICA tortuous but widely patent. Vertebral arteries: Both vertebral arteries patent at their origins. Narrowing and irregularity of both proximal vertebral arteries, more severe on the right than the left. This could be due to advanced diffuse atherosclerotic disease, but dissection is not excluded on either side. More distally, the vessels appear widely patent and normal. Skeleton: Normal Other neck: No mass or lymphadenopathy. The patient does have thyroid goiter. Upper chest: Lung apices are clear and normal. Review of the MIP images confirms the above findings CTA HEAD FINDINGS Anterior circulation: Both internal carotid arteries are patent through the skull base and siphon regions without stenosis. The anterior and middle  cerebral vessels are patent without proximal stenosis, aneurysm or vascular malformation. Posterior circulation: Both vertebral arteries are widely patent through the foramen magnum to the basilar. No basilar stenosis. Posterior circulation branch vessels appear patent and normal. Venous sinuses: Patent and normal. Anatomic variants: None significant. Delayed phase: No abnormal  enhancement. Review of the MIP images confirms the above findings IMPRESSION: Carotid bifurcation atherosclerosis but no stenosis or irregularity. No intracranial anterior circulation abnormality. Abnormal appearance of the proximal vertebral arteries bilaterally. I think the appearance is more consistent with advanced atherosclerotic disease, with narrowing and irregularity. I cannot exclude the possibility of vertebral artery dissection on either or both sides however. In the upper cervical region, the vertebral arteries regain a normal appearance in the vessels are widely patent through the foramen magnum to the basilar, without identifiable intracranial posterior circulation abnormality. Electronically Signed   By: Nelson Chimes M.D.   On: 05/08/2018 16:06   Ct Angio Neck W And/or Wo Contrast  Result Date: 05/08/2018 CLINICAL DATA:  Acute onset of dizziness an weakness beginning yesterday. Weakness particularly left-sided. Negative acute head CT today. EXAM: CT ANGIOGRAPHY HEAD AND NECK TECHNIQUE: Multidetector CT imaging of the head and neck was performed using the standard protocol during bolus administration of intravenous contrast. Multiplanar CT image reconstructions and MIPs were obtained to evaluate the vascular anatomy. Carotid stenosis measurements (when applicable) are obtained utilizing NASCET criteria, using the distal internal carotid diameter as the denominator. CONTRAST:  46mL ISOVUE-370 IOPAMIDOL (ISOVUE-370) INJECTION 76% COMPARISON:  Head CT earlier same day and 05/03/2012. FINDINGS: CTA NECK FINDINGS Aortic arch: Mild aortic atherosclerosis. No aneurysm or dissection. Branching pattern of the brachiocephalic vessels from the arch is normal. No origin stenosis. Right carotid system: Common carotid artery widely patent to the bifurcation region. Soft and calcified plaque at the bifurcation and ICA bulb but no stenosis. Cervical ICA tortuous but widely patent. Left carotid system: Left common  carotid artery widely patent to the bifurcation region. Soft and calcified plaque at the carotid bifurcation but no stenosis. Cervical ICA tortuous but widely patent. Vertebral arteries: Both vertebral arteries patent at their origins. Narrowing and irregularity of both proximal vertebral arteries, more severe on the right than the left. This could be due to advanced diffuse atherosclerotic disease, but dissection is not excluded on either side. More distally, the vessels appear widely patent and normal. Skeleton: Normal Other neck: No mass or lymphadenopathy. The patient does have thyroid goiter. Upper chest: Lung apices are clear and normal. Review of the MIP images confirms the above findings CTA HEAD FINDINGS Anterior circulation: Both internal carotid arteries are patent through the skull base and siphon regions without stenosis. The anterior and middle cerebral vessels are patent without proximal stenosis, aneurysm or vascular malformation. Posterior circulation: Both vertebral arteries are widely patent through the foramen magnum to the basilar. No basilar stenosis. Posterior circulation branch vessels appear patent and normal. Venous sinuses: Patent and normal. Anatomic variants: None significant. Delayed phase: No abnormal enhancement. Review of the MIP images confirms the above findings IMPRESSION: Carotid bifurcation atherosclerosis but no stenosis or irregularity. No intracranial anterior circulation abnormality. Abnormal appearance of the proximal vertebral arteries bilaterally. I think the appearance is more consistent with advanced atherosclerotic disease, with narrowing and irregularity. I cannot exclude the possibility of vertebral artery dissection on either or both sides however. In the upper cervical region, the vertebral arteries regain a normal appearance in the vessels are widely patent through the foramen magnum to the basilar, without identifiable  intracranial posterior circulation  abnormality. Electronically Signed   By: Nelson Chimes M.D.   On: 05/08/2018 16:06   Mr Brain Wo Contrast  Result Date: 05/09/2018 CLINICAL DATA:  Intermittent dizziness and ataxia for a week. Left facial numbness. History of hypertension. EXAM: MRI HEAD WITHOUT CONTRAST TECHNIQUE: Multiplanar, multiecho pulse sequences of the brain and surrounding structures were obtained without intravenous contrast. COMPARISON:  CT head 05/08/2018 FINDINGS: INTRACRANIAL CONTENTS: No reduced diffusion to suggest acute ischemia. No susceptibility artifact to suggest hemorrhage. Old right caudate infarct with ex-vacuo right lateral ventricle. No overall parenchymal brain volume loss for age. A few scattered supratentorial subcentimeter T2 hyperintensities. No suspicious parenchymal signal, masses, mass effect. No abnormal extra-axial fluid collections. No extra-axial masses. VASCULAR: Normal major intracranial vascular flow voids present at skull base. SKULL AND UPPER CERVICAL SPINE: No abnormal sellar expansion. No suspicious calvarial bone marrow signal. Craniocervical junction maintained. SINUSES/ORBITS: Trace paranasal sinus mucosal thickening, small right ethmoid mucosal retention cyst. Left mastoid effusion.The included ocular globes and orbital contents are non-suspicious. OTHER: None. IMPRESSION: 1.  No acute intracranial process. 2. Old right basal ganglia infarct. Mild chronic small vessel ischemic changes. Electronically Signed   By: Elon Alas M.D.   On: 05/09/2018 00:32   Ct Head Code Stroke Wo Contrast  Result Date: 05/08/2018 CLINICAL DATA:  Code stroke. Left-sided weakness and syncopal episode yesterday which than resolved. Syncopal episode today with subsequent facial numbness. Last seen normal 2 hours ago. EXAM: CT HEAD WITHOUT CONTRAST TECHNIQUE: Contiguous axial images were obtained from the base of the skull through the vertex without intravenous contrast. COMPARISON:  CT head 05/03/2012. FINDINGS:  Brain: A remote lacunar infarct of the right caudate head and anterior limb right internal capsule is again seen. Periventricular and subcortical white matter changes are mildly advanced for age. Basal ganglia are otherwise intact. Insular cortex is normal. Ventricles are of normal size. No significant extra-axial fluid collection is present. The brainstem and cerebellum are normal. Vascular: No hyperdense vessel or unexpected calcification. Skull: Calvarium is intact. No focal lytic or blastic lesions are present. No significant extracranial soft tissue lesions are present. Sinuses/Orbits: The paranasal sinuses and right mastoid air cells are clear. Sclerotic changes of the left mastoid are stable. The globes and orbits are within normal limits. ASPECTS [facility] Stroke Program Early CT Score) - Ganglionic level infarction (caudate, lentiform nuclei, internal capsule, insula, M1-M3 cortex): 7/7 - Supraganglionic infarction (M4-M6 cortex): 3/3 Total score (0-10 with 10 being normal): 10/10 IMPRESSION: 1. No acute intracranial abnormality. 2. 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 3. Atrophy and white matter changes are mildly advanced for age. The finding is nonspecific but can be seen in the setting of chronic microvascular ischemia, a demyelinating process such as multiple sclerosis, vasculitis, complicated migraine headaches, or as the sequelae of a prior infectious or inflammatory process. 4. ASPECTS is 10/10 These results were called by telephone at the time of interpretation on 05/08/2018 at 3:11 pm to Dr. Conni Slipper , who verbally acknowledged these results. Electronically Signed   By: San Morelle M.D.   On: 05/08/2018 15:12   2D Echocardiogram  - Left ventricle: The cavity size was normal. Systolic function was normal. The estimated ejection fraction was in the range of 60% to 65%. Wall motion was normal; there were no regional wall motion  abnormalities. Left ventricular diastolic function parameters were normal.  PHYSICAL EXAM General: Appears well-developed and well-nourished, overweight Psych: Affect  appropriate to situation Eyes: No scleral injection HENT: No OP obstrucion Head: Normocephalic.  Cardiovascular: Normal rate and regular rhythm.  Respiratory: Effort normal and breath sounds normal to anterior ascultation Skin: WDI  Neurological Examination Mental Status: Alert, oriented, thought content appropriate.  Speech fluent without evidence of aphasia. Able to follow 3 step commands without difficulty. Cranial Nerves: II: Visual fields grossly normal,  III,IV, VI: ptosis not present, extra-ocular motions intact bilaterally, pupils equal, round, reactive to light and accommodation V,VII: smile symmetric, facial light touch sensation normal bilaterally VIII: hearing normal bilaterally IX,X: uvula rises symmetrically XI: bilateral shoulder shrug XII: midline tongue extension Motor: Right :  Upper extremity   5/5                                      Left:     Upper extremity   5/5             Lower extremity   5/5                                                  Lower extremity   5/5 Tone and bulk:normal tone throughout; no atrophy noted Sensory: Pinprick and light touch intact throughout, bilaterally Plantars: Right: downgoing                                Left: downgoing Cerebellar: normal finger-to-nose, normal rapid alternating movements and normal heel-to-shin test Gait: defered   ASSESSMENT/PLAN Mr. KALVIN BUSS is a 62 y.o. male with history of HTN, HLD and former smoker/smokess tobacco user presenting ro [facility] with dizziness, numbness of L face/head. Transferred to [facility] for DSA to look at VAs.   Recurrent episodes of Dizziness:  Possible posterior TIA, presyncope, hypertensive urgency of vestibular proxysmia vs arrhythmia   Code Stroke CT head No acute stroke. old R caudate/ALIC infarct. Small  vessel disease. Atrophy. ASPECTS 10.     CTA head & neck ICA atherosclerosis. B prox VA abnormal c/w advanced atherosclerosis but open distally  MRI  No acute stroke. Old R BG infarct. Small vessel disease.   2D Echo  EF 60-65%. No source of embolus   No indication to perform DSA  LDL 113  HgbA1c 5.5  SCDs for VTE prophylaxis  aspirin 81 mg daily prior to admission, now on aspirin 325 mg daily and clopidogrel 75 mg dailyx 3 months then aspirin alone  Cardiology to place OP event monitor  Therapy recommendations:  No therapy needs  Disposition:  Return home  [place] for d/c from stroke standpoint  Will arrange OP sleep study - pt/wife request [place] Stroke team will sign off. Follow up in the office in 4 weeks. Orders placed.   Hypertension  Elevated yesterday, 140s today . Permissive hypertension (OK if < 220/120) but gradually normalize in 5-7 days . Long-term BP goal normotensive  Hyperlipidemia  Home meds:  No statin  LDL 113, goal < 100  Add lipitor 20  Continue statin at discharge  Other Stroke Risk Factors  Former Cigarette smoker/smokeless tobacco user, quit 5 yrs ago  Obesity, There is no height or weight on file to calculate BMI., recommend weight loss, diet and exercise as appropriate  Family hx stroke (father)  No hx Migraines or HA  Given body habitus, at risk for obstructive sleep apnea. Will arrange for OP evaluation ([place] at request of pt/family).   Hospital day # 2  Burnetta Sabin, MSN, APRN, ANVP-BC, AGPCNP-BC Advanced Practice Stroke Nurse [place] for Schedule & Pager information 05/10/2018 8:27 AM   ATTENDING NOTE: I reviewed above note and agree with the assessment and plan. I have made any additions or clarifications directly to the above note. Pt was seen and examined.   62 year old male with history of lower back pain, depression, hyperlipidemia and hypertension admitted for 3 episode of  disequilibrium, lightheadedness, diaphoresis, pale looking and one-time falling and nausea vomiting.  Symptoms are lasting 3 to 5 minutes.  He denies vertigo feeling and also denies any headache associate with the episodes, no ear ringing or hearing loss, no recent infection.  Episodes not consistently associate with motion.  Denies chest pain, shortness breath, or loss of consciousness.  CT and MRI unremarkable.  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  LDL 113 and A1c 5.5.  UDS negative. EF 60-65%.   Patient presentation atypical, however, differential diagnoses include posterior TIA, presyncope, hypertensive urgency or vestibular proxysmia. cardiology on board, will do outpt 30 day cardiac event monitoring, CTA coronary artery and sleep study. Given bilateral vertebral artery stenosis, will treat with aspirin 325 and Plavix 75 DAPT for 3 months and then Plavix alone. Continue lipitor 20.  Recommend BP check at home.  BP goal normotensive.   Neurology will sign off. Please call with questions. Pt will follow up with stroke clinic NP at [facility] in about 4 weeks. Thanks for the consult.   Rosalin Hawking, MD PhD Stroke Neurology 05/10/2018 2:01 PM     To contact Stroke Continuity provider, please refer to [email or web address] After hours, contact General Neurology

## 2018-05-10 NOTE — Discharge Summary (Signed)
PATIENT DETAILS Name: Tyrone Mcdowell Age: 62 y.o. Sex: male Date of Birth: Sep 20, 1956 MRN: [id number]. Admitting Physician: Toy Baker, MD BDZ:[id number], Aris Everts, MD  Admit Date: 05/08/2018 Discharge date: 05/10/2018  Recommendations for Outpatient Follow-up:  1. Follow up with PCP in 1-2 weeks 2. Please obtain BMP/CBC in one week 3. Please ensure follow-up with cardiology, and neurology.   4. Aspirin Plavix for 3 months, followed by aspirin alone  Admitted From:  Home  Disposition: [place]: No  Equipment/Devices: None  Discharge Condition: Stable  CODE STATUS: FULL CODE  Diet recommendation:  Heart Healthy  Brief Summary: See H&P, Labs, Consult and Test reports for all details in brief, Tyrone Mcdowell an 61 y.o.malepast medical history of hypertension, hyperlipidemia, former smoker presented to ED with 3 episodes of dizziness in the last week, he reports lightheadedness and not vertigo, denies syncope.CT head and MRI negative for CVA, evidence of small old CVA noted.Pt also mentions dyspnea on exertion for months and atleast 2 episodes of chest pressure, tightness recently which was self limiting and patient was subsequently admitted and underwent significant inpatient work-up-see below for further details  Brief Hospital Course: Dizziness: Etiology unclear at this time-MRI of the brain did not show CVA,CTA neck noted carotid bifurcation atherosclerosis but no stenosis and abnormal appearance of the proximal vertebral arteries bilaterally, appearance is more consistent with advanced atherosclerotic disease.  Telemetry was unremarkable, 2D echocardiogram showed preserved EF.  Evaluated by stroke team-no further inpatient work-up recommended, recommendations are to continue with aspirin and Plavix for 3 months, then aspirin alone.  Patient has also been started on Lipitor.  Patient will need to follow-up with neurology in the outpatient setting.   Patient's dizziness has completely resolved-this morning he was ambulating in the room without any major issues at all.  Chest pain/mild exertional dyspnea: Evaluated by cardiology-transthoracic echocardiogram was unremarkable.  Thought to have atypical chest pain.  Cardiology recommending outpatient including outpatient coronary CTA and outpatient sleep study.  Per cardiology-no further inpatient work-up required at this time.  Echo cardiogram showed preserved EF without any major abnormalities.  Former smoker:35ppd, quit smoking 5years ago-counseled.  Hypertension: controlled-resume HCTZ, amlodipine and losartan on discharge  Hyperlipidemia LDL goal <100:continue Statin, LDL 113  Old CVA (cerebral vascular accident):incidental finding of Old CVA on MRI,ASA/Plavix/statin to be continued on discharge  Procedures/Studies: None  Discharge Diagnoses:  Active Problems:   Hypertension   Hyperlipidemia LDL goal <100   CVA (cerebral vascular accident) ([place])   Stenosis of both vertebral arteries   TIA (transient ischemic attack)   Discharge Instructions:  Activity:  As tolerated with Full fall precautions use walker/cane & assistance as needed   Discharge Instructions    Ambulatory referral to Neurology   Complete by:  As directed    Follow up with stroke clinic NP (Jessica Vanschaick or Cecille Rubin, if both not available, consider Dr. Antony Contras, Dr. Bess Harvest, or Dr. Sarina Ill) at [facility] in about 4 weeks.   Ambulatory referral to Sleep Studies   Complete by:  As directed    Patient with stroke. Has witnessed sleep apnea in the hospital. Consider for sleep study   Diet - low sodium heart healthy   Complete by:  As directed    Discharge instructions   Complete by:  As directed    Follow with Primary MD  Tyrone Mc, MD  In 1 week  Follow with cardiologist on July 10  Stroke  clinic will give you call with an appointment  Please get a  complete blood count and chemistry panel checked by your Primary MD at your next visit, and again as instructed by your Primary MD.  Get Medicines reviewed and adjusted: Please take all your medications with you for your next visit with your Primary MD  Laboratory/radiological data: Please request your Primary MD to go over all hospital tests and procedure/radiological results at the follow up, please ask your Primary MD to get all Hospital records sent to his/her office.  In some cases, they will be blood work, cultures and biopsy results pending at the time of your discharge. Please request that your primary care M.D. follows up on these results.  Also Note the following: If you experience worsening of your admission symptoms, develop shortness of breath, life threatening emergency, suicidal or homicidal thoughts you must seek medical attention immediately by calling 911 or calling your MD immediately  if symptoms less severe.  You must read complete instructions/literature along with all the possible adverse reactions/side effects for all the Medicines you take and that have been prescribed to you. Take any new Medicines after you have completely understood and accpet all the possible adverse reactions/side effects.   Do not drive when taking Pain medications or sleeping medications (Benzodaizepines)  Do not take more than prescribed Pain, Sleep and Anxiety Medications. It is not advisable to combine anxiety,sleep and pain medications without talking with your primary care practitioner  Special Instructions: If you have smoked or chewed Tobacco  in the last 2 yrs please stop smoking, stop any regular Alcohol  and or any Recreational drug use.  Wear Seat belts while driving.  Please note: You were cared for by a hospitalist during your hospital stay. Once you are discharged, your primary care physician will handle any further medical issues. Please note that NO REFILLS for any discharge  medications will be authorized once you are discharged, as it is imperative that you return to your primary care physician (or establish a relationship with a primary care physician if you do not have one) for your post hospital discharge needs so that they can reassess your need for medications and monitor your lab values.   Increase activity slowly   Complete by:  As directed      Allergies as of 05/10/2018   No Known Allergies     Medication List    STOP taking these medications   Liraglutide -Weight Management 18 MG/3ML Sopn Commonly known as:  SAXENDA   Pen Needles 31G X 6 MM Misc   predniSONE 10 MG tablet Commonly known as:  DELTASONE   sertraline 100 MG tablet Commonly known as:  ZOLOFT     TAKE these medications   amLODipine 10 MG tablet Commonly known as:  NORVASC TAKE 1 TABLET BY MOUTH DAILY What changed:    how much to take  how to take this  when to take this   aspirin 325 MG tablet Take 1 tablet (325 mg total) by mouth daily. Start taking on:  05/11/2018 What changed:    medication strength  how much to take   atorvastatin 20 MG tablet Commonly known as:  LIPITOR Take 1 tablet (20 mg total) by mouth daily at 6 PM.   clopidogrel 75 MG tablet Commonly known as:  PLAVIX Take 1 tablet (75 mg total) by mouth daily. Start taking on:  05/11/2018   DULoxetine 30 MG capsule Commonly known as:  CYMBALTA TAKE 1  CAPSULE (30 MG TOTAL) BY MOUTH DAILY. INCREASE TO TWICE DAILY AFTER 2 WEEKS What changed:    when to take this  additional instructions   furosemide 20 MG tablet Commonly known as:  LASIX TAKE 1 TABLET BY MOUTH DAILY What changed:    how much to take  how to take this  when to take this   gabapentin 100 MG capsule Commonly known as:  NEURONTIN TAKE 2 CAPSULES BY MOUTH 3 TIMES DAILY. What changed:  See the new instructions.   hydrochlorothiazide 25 MG tablet Commonly known as:  HYDRODIURIL TAKE 1 TABLET (25 MG TOTAL) BY MOUTH DAILY.    tizanidine 6 MG capsule Commonly known as:  ZANAFLEX TAKE 1 CAPSULE BY MOUTH 3 TIMES DAILY. What changed:    how much to take  how to take this  when to take this   traMADol 50 MG tablet Commonly known as:  ULTRAM Take 1 tablet (50 mg total) by mouth every 6 (six) hours as needed. For chronic back and leg pain   valsartan 160 MG tablet Commonly known as:  DIOVAN TAKE 1 TABLET BY MOUTH DAILY What changed:    how much to take  how to take this  when to take this      Follow-up Information    Gollan, Kathlene November, MD Follow up.   Specialty:  Cardiology Why:  Cardiology hospital follow up on July 10th at 10:20 after testing is done. Please arrive 15 minutes early for checkin.  Contact information: [address] [place] [address] [phone]        [facility] Follow up.   Specialty:  Cardiology Why:  The office will call you to schedule a 30 day cardiac event monitor and sleep study as well cardiac CT.  Contact information: [address] [place] [phone]       [facility] Follow up in 4 week(s).   Specialty:  Neurology Why:  stroke clinic. office will call with appt date and time. Contact information: [address] [place] [facility]. Schedule an appointment as soon as possible for a visit.   Specialty:  Sleep Medicine Why:  an electronic request has been sent for sleep study. if you are not contacted by them, please give them a call. Contact information: [place] [id number] [place] [id number]         No Known Allergies  Consultations:   cardiology and neurology  Other Procedures/Studies: Ct Angio Head W Or Wo Contrast  Result Date: 05/08/2018 CLINICAL DATA:  Acute onset of dizziness an weakness beginning  yesterday. Weakness particularly left-sided. Negative acute head CT today. EXAM: CT ANGIOGRAPHY HEAD AND NECK TECHNIQUE: Multidetector CT imaging of the head and neck was performed using the standard protocol during bolus administration of intravenous contrast. Multiplanar CT image reconstructions and MIPs were obtained to evaluate the vascular anatomy. Carotid stenosis measurements (when applicable) are obtained utilizing NASCET criteria, using the distal internal carotid diameter as the denominator. CONTRAST:  69mL ISOVUE-370 IOPAMIDOL (ISOVUE-370) INJECTION 76% COMPARISON:  Head CT earlier same day and 05/03/2012. FINDINGS: CTA NECK FINDINGS Aortic arch: Mild aortic atherosclerosis. No aneurysm or dissection. Branching pattern of the brachiocephalic vessels from the arch is normal. No origin stenosis. Right carotid system: Common carotid artery widely patent to the bifurcation region. Soft and calcified  plaque at the bifurcation and ICA bulb but no stenosis. Cervical ICA tortuous but widely patent. Left carotid system: Left common carotid artery widely patent to the bifurcation region. Soft and calcified plaque at the carotid bifurcation but no stenosis. Cervical ICA tortuous but widely patent. Vertebral arteries: Both vertebral arteries patent at their origins. Narrowing and irregularity of both proximal vertebral arteries, more severe on the right than the left. This could be due to advanced diffuse atherosclerotic disease, but dissection is not excluded on either side. More distally, the vessels appear widely patent and normal. Skeleton: Normal Other neck: No mass or lymphadenopathy. The patient does have thyroid goiter. Upper chest: Lung apices are clear and normal. Review of the MIP images confirms the above findings CTA HEAD FINDINGS Anterior circulation: Both internal carotid arteries are patent through the skull base and siphon regions without stenosis. The anterior and middle cerebral vessels are patent  without proximal stenosis, aneurysm or vascular malformation. Posterior circulation: Both vertebral arteries are widely patent through the foramen magnum to the basilar. No basilar stenosis. Posterior circulation branch vessels appear patent and normal. Venous sinuses: Patent and normal. Anatomic variants: None significant. Delayed phase: No abnormal enhancement. Review of the MIP images confirms the above findings IMPRESSION: Carotid bifurcation atherosclerosis but no stenosis or irregularity. No intracranial anterior circulation abnormality. Abnormal appearance of the proximal vertebral arteries bilaterally. I think the appearance is more consistent with advanced atherosclerotic disease, with narrowing and irregularity. I cannot exclude the possibility of vertebral artery dissection on either or both sides however. In the upper cervical region, the vertebral arteries regain a normal appearance in the vessels are widely patent through the foramen magnum to the basilar, without identifiable intracranial posterior circulation abnormality. Electronically Signed   By: Nelson Chimes M.D.   On: 05/08/2018 16:06   Ct Angio Neck W And/or Wo Contrast  Result Date: 05/08/2018 CLINICAL DATA:  Acute onset of dizziness an weakness beginning yesterday. Weakness particularly left-sided. Negative acute head CT today. EXAM: CT ANGIOGRAPHY HEAD AND NECK TECHNIQUE: Multidetector CT imaging of the head and neck was performed using the standard protocol during bolus administration of intravenous contrast. Multiplanar CT image reconstructions and MIPs were obtained to evaluate the vascular anatomy. Carotid stenosis measurements (when applicable) are obtained utilizing NASCET criteria, using the distal internal carotid diameter as the denominator. CONTRAST:  89mL ISOVUE-370 IOPAMIDOL (ISOVUE-370) INJECTION 76% COMPARISON:  Head CT earlier same day and 05/03/2012. FINDINGS: CTA NECK FINDINGS Aortic arch: Mild aortic atherosclerosis. No  aneurysm or dissection. Branching pattern of the brachiocephalic vessels from the arch is normal. No origin stenosis. Right carotid system: Common carotid artery widely patent to the bifurcation region. Soft and calcified plaque at the bifurcation and ICA bulb but no stenosis. Cervical ICA tortuous but widely patent. Left carotid system: Left common carotid artery widely patent to the bifurcation region. Soft and calcified plaque at the carotid bifurcation but no stenosis. Cervical ICA tortuous but widely patent. Vertebral arteries: Both vertebral arteries patent at their origins. Narrowing and irregularity of both proximal vertebral arteries, more severe on the right than the left. This could be due to advanced diffuse atherosclerotic disease, but dissection is not excluded on either side. More distally, the vessels appear widely patent and normal. Skeleton: Normal Other neck: No mass or lymphadenopathy. The patient does have thyroid goiter. Upper chest: Lung apices are clear and normal. Review of the MIP images confirms the above findings CTA HEAD FINDINGS Anterior circulation: Both internal carotid arteries are patent  through the skull base and siphon regions without stenosis. The anterior and middle cerebral vessels are patent without proximal stenosis, aneurysm or vascular malformation. Posterior circulation: Both vertebral arteries are widely patent through the foramen magnum to the basilar. No basilar stenosis. Posterior circulation branch vessels appear patent and normal. Venous sinuses: Patent and normal. Anatomic variants: None significant. Delayed phase: No abnormal enhancement. Review of the MIP images confirms the above findings IMPRESSION: Carotid bifurcation atherosclerosis but no stenosis or irregularity. No intracranial anterior circulation abnormality. Abnormal appearance of the proximal vertebral arteries bilaterally. I think the appearance is more consistent with advanced atherosclerotic disease,  with narrowing and irregularity. I cannot exclude the possibility of vertebral artery dissection on either or both sides however. In the upper cervical region, the vertebral arteries regain a normal appearance in the vessels are widely patent through the foramen magnum to the basilar, without identifiable intracranial posterior circulation abnormality. Electronically Signed   By: Nelson Chimes M.D.   On: 05/08/2018 16:06   Mr Brain Wo Contrast  Result Date: 05/09/2018 CLINICAL DATA:  Intermittent dizziness and ataxia for a week. Left facial numbness. History of hypertension. EXAM: MRI HEAD WITHOUT CONTRAST TECHNIQUE: Multiplanar, multiecho pulse sequences of the brain and surrounding structures were obtained without intravenous contrast. COMPARISON:  CT head 05/08/2018 FINDINGS: INTRACRANIAL CONTENTS: No reduced diffusion to suggest acute ischemia. No susceptibility artifact to suggest hemorrhage. Old right caudate infarct with ex-vacuo right lateral ventricle. No overall parenchymal brain volume loss for age. A few scattered supratentorial subcentimeter T2 hyperintensities. No suspicious parenchymal signal, masses, mass effect. No abnormal extra-axial fluid collections. No extra-axial masses. VASCULAR: Normal major intracranial vascular flow voids present at skull base. SKULL AND UPPER CERVICAL SPINE: No abnormal sellar expansion. No suspicious calvarial bone marrow signal. Craniocervical junction maintained. SINUSES/ORBITS: Trace paranasal sinus mucosal thickening, small right ethmoid mucosal retention cyst. Left mastoid effusion.The included ocular globes and orbital contents are non-suspicious. OTHER: None. IMPRESSION: 1.  No acute intracranial process. 2. Old right basal ganglia infarct. Mild chronic small vessel ischemic changes. Electronically Signed   By: Elon Alas M.D.   On: 05/09/2018 00:32   Ct Head Code Stroke Wo Contrast  Result Date: 05/08/2018 CLINICAL DATA:  Code stroke. Left-sided  weakness and syncopal episode yesterday which than resolved. Syncopal episode today with subsequent facial numbness. Last seen normal 2 hours ago. EXAM: CT HEAD WITHOUT CONTRAST TECHNIQUE: Contiguous axial images were obtained from the base of the skull through the vertex without intravenous contrast. COMPARISON:  CT head 05/03/2012. FINDINGS: Brain: A remote lacunar infarct of the right caudate head and anterior limb right internal capsule is again seen. Periventricular and subcortical white matter changes are mildly advanced for age. Basal ganglia are otherwise intact. Insular cortex is normal. Ventricles are of normal size. No significant extra-axial fluid collection is present. The brainstem and cerebellum are normal. Vascular: No hyperdense vessel or unexpected calcification. Skull: Calvarium is intact. No focal lytic or blastic lesions are present. No significant extracranial soft tissue lesions are present. Sinuses/Orbits: The paranasal sinuses and right mastoid air cells are clear. Sclerotic changes of the left mastoid are stable. The globes and orbits are within normal limits. ASPECTS [facility] Stroke Program Early CT Score) - Ganglionic level infarction (caudate, lentiform nuclei, internal capsule, insula, M1-M3 cortex): 7/7 - Supraganglionic infarction (M4-M6 cortex): 3/3 Total score (0-10 with 10 being normal): 10/10 IMPRESSION: 1. No acute intracranial abnormality. 2. Remote lacunar infarct of the right caudate head and anterior limb internal capsule is  stable. 3. Atrophy and white matter changes are mildly advanced for age. The finding is nonspecific but can be seen in the setting of chronic microvascular ischemia, a demyelinating process such as multiple sclerosis, vasculitis, complicated migraine headaches, or as the sequelae of a prior infectious or inflammatory process. 4. ASPECTS is 10/10 These results were called by telephone at the time of interpretation on 05/08/2018 at 3:11 pm to Dr. Conni Slipper  , who verbally acknowledged these results. Electronically Signed   By: San Morelle M.D.   On: 05/08/2018 15:12      TODAY-DAY OF DISCHARGE:  Subjective:   Neale Burly today has no headache,no chest abdominal pain,no new weakness tingling or numbness, feels much better wants to go home today.  Objective:   Blood pressure (!) 153/98, pulse 74, temperature 98.5 F (36.9 C), temperature source Oral, resp. rate 16, SpO2 96 %.  Intake/Output Summary (Last 24 hours) at 05/10/2018 1104 Last data filed at 05/10/2018 1008 Gross per 24 hour  Intake 700 ml  Output -  Net 700 ml   There were no vitals filed for this visit.  Exam: Awake Alert, Oriented *3, No new F.N deficits, Normal affect [place].AT,PERRAL Supple Neck,No JVD, No cervical lymphadenopathy appriciated.  Symmetrical Chest wall movement, Good air movement bilaterally, CTAB RRR,No Gallops,Rubs or new Murmurs, No Parasternal Heave +ve B.Sounds, Abd Soft, Non tender, No organomegaly appriciated, No rebound -guarding or rigidity. No Cyanosis, Clubbing or edema, No new Rash or bruise   PERTINENT RADIOLOGIC STUDIES: Ct Angio Head W Or Wo Contrast  Result Date: 05/08/2018 CLINICAL DATA:  Acute onset of dizziness an weakness beginning yesterday. Weakness particularly left-sided. Negative acute head CT today. EXAM: CT ANGIOGRAPHY HEAD AND NECK TECHNIQUE: Multidetector CT imaging of the head and neck was performed using the standard protocol during bolus administration of intravenous contrast. Multiplanar CT image reconstructions and MIPs were obtained to evaluate the vascular anatomy. Carotid stenosis measurements (when applicable) are obtained utilizing NASCET criteria, using the distal internal carotid diameter as the denominator. CONTRAST:  40mL ISOVUE-370 IOPAMIDOL (ISOVUE-370) INJECTION 76% COMPARISON:  Head CT earlier same day and 05/03/2012. FINDINGS: CTA NECK FINDINGS Aortic arch: Mild aortic atherosclerosis. No aneurysm or  dissection. Branching pattern of the brachiocephalic vessels from the arch is normal. No origin stenosis. Right carotid system: Common carotid artery widely patent to the bifurcation region. Soft and calcified plaque at the bifurcation and ICA bulb but no stenosis. Cervical ICA tortuous but widely patent. Left carotid system: Left common carotid artery widely patent to the bifurcation region. Soft and calcified plaque at the carotid bifurcation but no stenosis. Cervical ICA tortuous but widely patent. Vertebral arteries: Both vertebral arteries patent at their origins. Narrowing and irregularity of both proximal vertebral arteries, more severe on the right than the left. This could be due to advanced diffuse atherosclerotic disease, but dissection is not excluded on either side. More distally, the vessels appear widely patent and normal. Skeleton: Normal Other neck: No mass or lymphadenopathy. The patient does have thyroid goiter. Upper chest: Lung apices are clear and normal. Review of the MIP images confirms the above findings CTA HEAD FINDINGS Anterior circulation: Both internal carotid arteries are patent through the skull base and siphon regions without stenosis. The anterior and middle cerebral vessels are patent without proximal stenosis, aneurysm or vascular malformation. Posterior circulation: Both vertebral arteries are widely patent through the foramen magnum to the basilar. No basilar stenosis. Posterior circulation branch vessels appear patent and normal. Venous sinuses: Patent  and normal. Anatomic variants: None significant. Delayed phase: No abnormal enhancement. Review of the MIP images confirms the above findings IMPRESSION: Carotid bifurcation atherosclerosis but no stenosis or irregularity. No intracranial anterior circulation abnormality. Abnormal appearance of the proximal vertebral arteries bilaterally. I think the appearance is more consistent with advanced atherosclerotic disease, with  narrowing and irregularity. I cannot exclude the possibility of vertebral artery dissection on either or both sides however. In the upper cervical region, the vertebral arteries regain a normal appearance in the vessels are widely patent through the foramen magnum to the basilar, without identifiable intracranial posterior circulation abnormality. Electronically Signed   By: Nelson Chimes M.D.   On: 05/08/2018 16:06   Ct Angio Neck W And/or Wo Contrast  Result Date: 05/08/2018 CLINICAL DATA:  Acute onset of dizziness an weakness beginning yesterday. Weakness particularly left-sided. Negative acute head CT today. EXAM: CT ANGIOGRAPHY HEAD AND NECK TECHNIQUE: Multidetector CT imaging of the head and neck was performed using the standard protocol during bolus administration of intravenous contrast. Multiplanar CT image reconstructions and MIPs were obtained to evaluate the vascular anatomy. Carotid stenosis measurements (when applicable) are obtained utilizing NASCET criteria, using the distal internal carotid diameter as the denominator. CONTRAST:  29mL ISOVUE-370 IOPAMIDOL (ISOVUE-370) INJECTION 76% COMPARISON:  Head CT earlier same day and 05/03/2012. FINDINGS: CTA NECK FINDINGS Aortic arch: Mild aortic atherosclerosis. No aneurysm or dissection. Branching pattern of the brachiocephalic vessels from the arch is normal. No origin stenosis. Right carotid system: Common carotid artery widely patent to the bifurcation region. Soft and calcified plaque at the bifurcation and ICA bulb but no stenosis. Cervical ICA tortuous but widely patent. Left carotid system: Left common carotid artery widely patent to the bifurcation region. Soft and calcified plaque at the carotid bifurcation but no stenosis. Cervical ICA tortuous but widely patent. Vertebral arteries: Both vertebral arteries patent at their origins. Narrowing and irregularity of both proximal vertebral arteries, more severe on the right than the left. This could  be due to advanced diffuse atherosclerotic disease, but dissection is not excluded on either side. More distally, the vessels appear widely patent and normal. Skeleton: Normal Other neck: No mass or lymphadenopathy. The patient does have thyroid goiter. Upper chest: Lung apices are clear and normal. Review of the MIP images confirms the above findings CTA HEAD FINDINGS Anterior circulation: Both internal carotid arteries are patent through the skull base and siphon regions without stenosis. The anterior and middle cerebral vessels are patent without proximal stenosis, aneurysm or vascular malformation. Posterior circulation: Both vertebral arteries are widely patent through the foramen magnum to the basilar. No basilar stenosis. Posterior circulation branch vessels appear patent and normal. Venous sinuses: Patent and normal. Anatomic variants: None significant. Delayed phase: No abnormal enhancement. Review of the MIP images confirms the above findings IMPRESSION: Carotid bifurcation atherosclerosis but no stenosis or irregularity. No intracranial anterior circulation abnormality. Abnormal appearance of the proximal vertebral arteries bilaterally. I think the appearance is more consistent with advanced atherosclerotic disease, with narrowing and irregularity. I cannot exclude the possibility of vertebral artery dissection on either or both sides however. In the upper cervical region, the vertebral arteries regain a normal appearance in the vessels are widely patent through the foramen magnum to the basilar, without identifiable intracranial posterior circulation abnormality. Electronically Signed   By: Nelson Chimes M.D.   On: 05/08/2018 16:06   Mr Brain Wo Contrast  Result Date: 05/09/2018 CLINICAL DATA:  Intermittent dizziness and ataxia for a week. Left facial  numbness. History of hypertension. EXAM: MRI HEAD WITHOUT CONTRAST TECHNIQUE: Multiplanar, multiecho pulse sequences of the brain and surrounding  structures were obtained without intravenous contrast. COMPARISON:  CT head 05/08/2018 FINDINGS: INTRACRANIAL CONTENTS: No reduced diffusion to suggest acute ischemia. No susceptibility artifact to suggest hemorrhage. Old right caudate infarct with ex-vacuo right lateral ventricle. No overall parenchymal brain volume loss for age. A few scattered supratentorial subcentimeter T2 hyperintensities. No suspicious parenchymal signal, masses, mass effect. No abnormal extra-axial fluid collections. No extra-axial masses. VASCULAR: Normal major intracranial vascular flow voids present at skull base. SKULL AND UPPER CERVICAL SPINE: No abnormal sellar expansion. No suspicious calvarial bone marrow signal. Craniocervical junction maintained. SINUSES/ORBITS: Trace paranasal sinus mucosal thickening, small right ethmoid mucosal retention cyst. Left mastoid effusion.The included ocular globes and orbital contents are non-suspicious. OTHER: None. IMPRESSION: 1.  No acute intracranial process. 2. Old right basal ganglia infarct. Mild chronic small vessel ischemic changes. Electronically Signed   By: Elon Alas M.D.   On: 05/09/2018 00:32   Ct Head Code Stroke Wo Contrast  Result Date: 05/08/2018 CLINICAL DATA:  Code stroke. Left-sided weakness and syncopal episode yesterday which than resolved. Syncopal episode today with subsequent facial numbness. Last seen normal 2 hours ago. EXAM: CT HEAD WITHOUT CONTRAST TECHNIQUE: Contiguous axial images were obtained from the base of the skull through the vertex without intravenous contrast. COMPARISON:  CT head 05/03/2012. FINDINGS: Brain: A remote lacunar infarct of the right caudate head and anterior limb right internal capsule is again seen. Periventricular and subcortical white matter changes are mildly advanced for age. Basal ganglia are otherwise intact. Insular cortex is normal. Ventricles are of normal size. No significant extra-axial fluid collection is present. The  brainstem and cerebellum are normal. Vascular: No hyperdense vessel or unexpected calcification. Skull: Calvarium is intact. No focal lytic or blastic lesions are present. No significant extracranial soft tissue lesions are present. Sinuses/Orbits: The paranasal sinuses and right mastoid air cells are clear. Sclerotic changes of the left mastoid are stable. The globes and orbits are within normal limits. ASPECTS [facility] Stroke Program Early CT Score) - Ganglionic level infarction (caudate, lentiform nuclei, internal capsule, insula, M1-M3 cortex): 7/7 - Supraganglionic infarction (M4-M6 cortex): 3/3 Total score (0-10 with 10 being normal): 10/10 IMPRESSION: 1. No acute intracranial abnormality. 2. 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 3. Atrophy and white matter changes are mildly advanced for age. The finding is nonspecific but can be seen in the setting of chronic microvascular ischemia, a demyelinating process such as multiple sclerosis, vasculitis, complicated migraine headaches, or as the sequelae of a prior infectious or inflammatory process. 4. ASPECTS is 10/10 These results were called by telephone at the time of interpretation on 05/08/2018 at 3:11 pm to Dr. Conni Slipper , who verbally acknowledged these results. Electronically Signed   By: San Morelle M.D.   On: 05/08/2018 15:12     PERTINENT LAB RESULTS: CBC: Recent Labs    05/08/18 1430  WBC 6.9  HGB 13.6  HCT 40.5  PLT 325   CMET CMP     Component Value Date/Time   NA 137 05/08/2018 1430   NA 144 05/03/2012 2101   K 3.5 05/08/2018 1430   K 3.6 05/03/2012 2101   CL 98 (L) 05/08/2018 1430   CL 109 (H) 05/03/2012 2101   CO2 30 05/08/2018 1430   CO2 25 05/03/2012 2101   GLUCOSE 90 05/08/2018 1430   GLUCOSE 81 05/03/2012  2101   BUN 18 05/08/2018 1430   BUN 11 05/03/2012 2101   CREATININE 1.00 05/08/2018 1430   CREATININE 0.79 05/03/2012 2101   CALCIUM 9.6  05/08/2018 1430   CALCIUM 9.1 05/03/2012 2101   PROT 7.7 11/07/2017 0850   ALBUMIN 4.4 11/07/2017 0850   AST 19 11/07/2017 0850   ALT 19 11/07/2017 0850   ALKPHOS 73 11/07/2017 0850   BILITOT 0.5 11/07/2017 0850   GFRNONAA >60 05/08/2018 1430   GFRNONAA >60 05/03/2012 2101   GFRAA >60 05/08/2018 1430   GFRAA >60 05/03/2012 2101    GFR Estimated Creatinine Clearance: 105.8 mL/min (by C-G formula based on SCr of 1 mg/dL). No results for input(s): LIPASE, AMYLASE in the last 72 hours. Recent Labs    05/08/18 1430 05/08/18 2109  TROPONINI <0.03 <0.03   Invalid input(s): POCBNP No results for input(s): DDIMER in the last 72 hours. Recent Labs    05/09/18 0342  HGBA1C 5.5   Recent Labs    05/09/18 0342  CHOL 170  HDL 35*  LDLCALC 113*  TRIG 111  CHOLHDL 4.9   No results for input(s): TSH, T4TOTAL, T3FREE, THYROIDAB in the last 72 hours.  Invalid input(s): FREET3 No results for input(s): VITAMINB12, FOLATE, FERRITIN, TIBC, IRON, RETICCTPCT in the last 72 hours. Coags: Recent Labs    05/08/18 1430  INR 1.07   Microbiology: No results found for this or any previous visit (from the past 240 hour(s)).  FURTHER DISCHARGE INSTRUCTIONS:  Get Medicines reviewed and adjusted: Please take all your medications with you for your next visit with your Primary MD  Laboratory/radiological data: Please request your Primary MD to go over all hospital tests and procedure/radiological results at the follow up, please ask your Primary MD to get all Hospital records sent to his/her office.  In some cases, they will be blood work, cultures and biopsy results pending at the time of your discharge. Please request that your primary care M.D. goes through all the records of your hospital data and follows up on these results.  Also Note the following: If you experience worsening of your admission symptoms, develop shortness of breath, life threatening emergency, suicidal or homicidal  thoughts you must seek medical attention immediately by calling 911 or calling your MD immediately  if symptoms less severe.  You must read complete instructions/literature along with all the possible adverse reactions/side effects for all the Medicines you take and that have been prescribed to you. Take any new Medicines after you have completely understood and accpet all the possible adverse reactions/side effects.   Do not drive when taking Pain medications or sleeping medications (Benzodaizepines)  Do not take more than prescribed Pain, Sleep and Anxiety Medications. It is not advisable to combine anxiety,sleep and pain medications without talking with your primary care practitioner  Special Instructions: If you have smoked or chewed Tobacco  in the last 2 yrs please stop smoking, stop any regular Alcohol  and or any Recreational drug use.  Wear Seat belts while driving.  Please note: You were cared for by a hospitalist during your hospital stay. Once you are discharged, your primary care physician will handle any further medical issues. Please note that NO REFILLS for any discharge medications will be authorized once you are discharged, as it is imperative that you return to your primary care physician (or establish a relationship with a primary care physician if you do not have one) for your post hospital discharge needs so that they can  reassess your need for medications and monitor your lab values.  Total Time spent coordinating discharge including counseling, education and face to face time equals  45 minutes.  Signed: Shanker Ghimire 05/10/2018 11:04 AM

## 2018-05-11 ENCOUNTER — Other Ambulatory Visit: Payer: Self-pay | Admitting: *Deleted

## 2018-05-11 NOTE — Patient Outreach (Signed)
[facility]) Care Management  05/11/2018  Tyrone Mcdowell [birth year] [id number]   Subjective: Telephone call to patient's home / mobile number, spoke with patient, and HIPAA verified.  Discussed [facility] Care Management [facility] Transition of care follow up, patient voiced understanding, and is in agreement to follow up.    Patient states he is doing ok, unable to talk at this time, and requested call back.     Objective:Per KPN (Knowledge Performance Now, point of care tool) and chart review, patient hospitalized 05/08/18 -05/10/18 for CVA and TIA.    Patient also has a history of hypertension and hyperlipidemia.        Assessment: Received [facility]MR Transition of care referral on 05/11/18.   Transition of care follow up pending patient contact.      Plan: RNCM will send unsuccessful outreach  letter, [facility] pamphlet, will call patient for 2nd telephone outreach attempt, transition of care follow up, and proceed with case closure, within 10 business days if no return call.        Jadeyn Hargett H. Annia Friendly, BSN, [place] Management [facility] Telephonic CM Phone: [phone] Fax: [phone]

## 2018-05-11 NOTE — Telephone Encounter (Signed)
Transition Care Management Follow-up Telephone Call   Date discharged? 05/10/18   How have you been since you were released from the hospital? Overall doing well.  No new or worsening symptoms presenting.  No falls.  No dizziness. No new weakness.  No chest or ABD pain. No problems with B/B.  Appetite ok.    Do you understand why you were in the hospital? Yes, dizziness and chest pain.    Do you understand the discharge instructions? Yes, increase activity slowly.  Use cane/walker assist as needed. BMP and CBC needed at appointment with primary care physician.    Where were you discharged to? Home.    Items Reviewed:  Medications reviewed: Yes. Start aspirin and plavix on 6/6 for 3 months, then aspirin only. Stop Liraglutide, prednisone, sertraline. Take all other scheduled medications as directed.   Allergies reviewed: NKA.  Dietary changes reviewed: Heart healthy, low sodium.   Referrals reviewed: Cardiology- scheduled July 10.  Neurology- awaiting appointment.    Functional Questionnaire:   Activities of Daily Living (ADLs):   He states they are independent in the following: all ADLs States they require assistance with the following: Ambulates with cane/walker.    Any transportation issues/concerns?: None.    Any patient concerns? None at this time.    Confirmed importance and date/time of follow-up visits scheduled Yes, appointment scheduled 05/15/18 at 11:00. Needs BMP/CBC to be drawn at that time.   Provider Appointment booked with Deborra Medina, MD. (PCP)   Confirmed with patient if condition begins to worsen call PCP or go to the ER.  Patient was given the office number and encouraged to call back with question or concerns.  : Yes.  No further questions or concerns at this time.

## 2018-05-12 ENCOUNTER — Encounter: Payer: Self-pay | Admitting: Internal Medicine

## 2018-05-15 ENCOUNTER — Encounter: Payer: Self-pay | Admitting: *Deleted

## 2018-05-15 ENCOUNTER — Ambulatory Visit (INDEPENDENT_AMBULATORY_CARE_PROVIDER_SITE_OTHER): Payer: 59 | Admitting: Internal Medicine

## 2018-05-15 ENCOUNTER — Encounter: Payer: Self-pay | Admitting: Internal Medicine

## 2018-05-15 ENCOUNTER — Telehealth: Payer: Self-pay | Admitting: *Deleted

## 2018-05-15 ENCOUNTER — Other Ambulatory Visit: Payer: Self-pay | Admitting: *Deleted

## 2018-05-15 VITALS — BP 118/80 | HR 66 | Temp 98.2°F | Resp 16 | Ht 70.0 in | Wt 282.2 lb

## 2018-05-15 DIAGNOSIS — R499 Unspecified voice and resonance disorder: Secondary | ICD-10-CM | POA: Diagnosis not present

## 2018-05-15 DIAGNOSIS — M79605 Pain in left leg: Secondary | ICD-10-CM

## 2018-05-15 DIAGNOSIS — Z79899 Other long term (current) drug therapy: Secondary | ICD-10-CM

## 2018-05-15 DIAGNOSIS — G459 Transient cerebral ischemic attack, unspecified: Secondary | ICD-10-CM | POA: Diagnosis not present

## 2018-05-15 DIAGNOSIS — Z09 Encounter for follow-up examination after completed treatment for conditions other than malignant neoplasm: Secondary | ICD-10-CM

## 2018-05-15 DIAGNOSIS — G8929 Other chronic pain: Secondary | ICD-10-CM | POA: Diagnosis not present

## 2018-05-15 DIAGNOSIS — R0683 Snoring: Secondary | ICD-10-CM

## 2018-05-15 DIAGNOSIS — Z01810 Encounter for preprocedural cardiovascular examination: Secondary | ICD-10-CM

## 2018-05-15 DIAGNOSIS — I10 Essential (primary) hypertension: Secondary | ICD-10-CM

## 2018-05-15 DIAGNOSIS — R42 Dizziness and giddiness: Secondary | ICD-10-CM

## 2018-05-15 DIAGNOSIS — E785 Hyperlipidemia, unspecified: Secondary | ICD-10-CM

## 2018-05-15 DIAGNOSIS — I639 Cerebral infarction, unspecified: Secondary | ICD-10-CM

## 2018-05-15 MED ORDER — TRAMADOL HCL 50 MG PO TABS: 50.0000 mg | ORAL_TABLET | Freq: Four times a day (QID) | ORAL | 5 refills | Status: DC | PRN

## 2018-05-15 MED ORDER — METOPROLOL TARTRATE 50 MG PO TABS: ORAL_TABLET | ORAL | 0 refills | Status: DC
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
## 2018-05-15 NOTE — Telephone Encounter (Signed)
-----   Message from Blain Pais sent at 05/11/2018  9:11 AM EDT ----- Regarding: FW: testing   ----- Message ----- From: Daune Perch, NP Sent: 05/09/2018   3:55 PM To: Blain Pais Subject: testing                                        This patient was seen in consultation in [facility] and wants to follow up in [place].  He will need a 30 day event monitor to evaluate for dizziness. He also needs a sleep study for snoring and witness apnea during sleep.  I have placed an order for cardiac CTA for chest pain which is done at [facility]. Can you see if they will call him to schedule?   He has a follow up with Dr. Rockey Situ on 7/10 after his event monitor and CTA have resulted.   Thank you, Pecolia Ades, NP Call me if you have questions  [phone]

## 2018-05-15 NOTE — Patient Outreach (Signed)
[facility]) Care Management  05/15/2018  Tyrone Mcdowell Apr 09, 1956 [id number]   Subjective:Telephone call to patient's home  / mobile number, no answer, left HIPAA compliant voicemail message, and requested call back.      Objective:Per KPN (Knowledge Performance Now, point of care tool) and chart review, patient hospitalized 05/08/18 -05/10/18 for CVA and TIA.    Patient also has a history of hypertension and hyperlipidemia.        Assessment: Received UMR Transition of care referral on 05/11/18.   Transition of care follow up pending patient contact.      Plan: RNCM has sent unsuccessful outreach  letter, [facility] pamphlet, will call patient for 3rd telephone outreach attempt, transition of care follow up, and proceed with case closure, within 10 business days if no return call.       Tyrone Mcdowell, BSN, [place] Management [facility] Telephonic CM Phone: [phone] Fax: [phone]

## 2018-05-15 NOTE — Patient Instructions (Addendum)
Non fasting labs needed around  June 19th to continue using atorvastatin  Tramadol refilled for 6 months

## 2018-05-15 NOTE — Telephone Encounter (Signed)
Called patient and he verbalized understanding of the following plan of care: 1- To wear [employer] Heart Monitor for 30 days. Would receive in the mail and to follow instructions and call [employer] to help with set up and someone from [employer] will call in the next day to verify his address before shipping. Follow up with Dr Rockey Situ moved out to give time to receive monitor and results for appointment.  2- Referral to Pulmonology for sleep study. Someone will call him to schedule and located in [place].  3- Cardiac CTA - In process of scheduling. This takes sometime to schedule and he would receive a call. He will possible need BMET at the [facility] prior to if current BMET from 05/08/18 is not within 30 days of the CT once scheduled. He was fine with me sending him a MyChart message with instructions for the Cardiac CTA.   Orders placed for referral to pulmonology and BMET at [facility]. Patient registered on [employer].  MyChart Letter sent to patient with instructions for Cardiac CTA.

## 2018-05-16 ENCOUNTER — Other Ambulatory Visit: Payer: Self-pay | Admitting: *Deleted

## 2018-05-16 DIAGNOSIS — Z09 Encounter for follow-up examination after completed treatment for conditions other than malignant neoplasm: Secondary | ICD-10-CM | POA: Insufficient documentation

## 2018-05-16 NOTE — Patient Outreach (Signed)
[place] [facility]) Care Management  05/16/2018  Tyrone Mcdowell 06-02-1956 [id number]   Subjective: Received voicemail message from Neale Burly, states he is returning call, and requested call back. Telephone call to patient's home / mobile number, spoke with patient, and HIPAA verified.  Discussed [facility] Care Management UMR Transition of care follow up, patient voiced understanding, and is in agreement to follow up.    Patient states he is currently driving,  will call this RNCM back when he gets home, or at a later time.     Objective:Per KPN (Knowledge Performance Now, point of care tool) and chart review,patient hospitalized 05/08/18 -05/10/18 for CVA and TIA. Patient also has a history of hypertension and hyperlipidemia.      Assessment: Received UMR Transition of care referral on 05/11/18.Transition of care follow up pending patient contact.     Plan:RNCM has sent unsuccessful outreach letter, [facility] pamphlet, and will proceed with case closure, within 10 business days if no return call.       Haizlee Henton H. Annia Friendly, BSN, [place] Management [facility] Telephonic CM Phone: [phone] Fax: [phone]

## 2018-05-16 NOTE — Assessment & Plan Note (Signed)
Diagnosed during recent admission for vertigo and ataxia. Outpatient workup for alternate causes in progress.  Continue asa/plavix x 3 months,  Longer depending on results of workup,  And statin .

## 2018-05-16 NOTE — Assessment & Plan Note (Signed)
S/p lumbar decompression May 2017.  He has weaned off narcotics and continues to use tramadol.  Refills given.

## 2018-05-16 NOTE — Assessment & Plan Note (Signed)
Well controlled on current regimen. Renal function stable, no changes today.  Lab Results  Component Value Date   CREATININE 1.00 05/08/2018   Lab Results  Component Value Date   NA 137 05/08/2018   K 3.5 05/08/2018   CL 98 (L) 05/08/2018   CO2 30 05/08/2018

## 2018-05-16 NOTE — Assessment & Plan Note (Addendum)
Patient is stable post discharge and has no new issues or questions about discharge plans at the visit today for hospital follow up. All labs , imaging studies and progress notes from admission were reviewed with patient today  .  Referral for sleep study, Coronary CT, 30 day monitor for arrhythmia  and neurology follow up are in progress/=

## 2018-05-16 NOTE — Assessment & Plan Note (Signed)
ENT evaluation t rule out throat CA was negative.  Speech therapy advised but not done yet.

## 2018-05-16 NOTE — Assessment & Plan Note (Signed)
Based on his untreated lipid profile, the risk of clinically significant CAD is 16% over the next 10 years, using the Framingham risk calculator.  He has historically  deferred treatment. But since his hospitalization for TIA  He is tolerating medication. rtc 3 weeks for heaptic panel    Lab Results  Component Value Date   CHOL 170 05/09/2018   HDL 35 (L) 05/09/2018   LDLCALC 113 (H) 05/09/2018   LDLDIRECT 142.8 10/23/2012   TRIG 111 05/09/2018   CHOLHDL 4.9 05/09/2018   Lab Results  Component Value Date   ALT 19 11/07/2017   AST 19 11/07/2017   ALKPHOS 73 11/07/2017   BILITOT 0.5 11/07/2017

## 2018-05-18 ENCOUNTER — Ambulatory Visit (INDEPENDENT_AMBULATORY_CARE_PROVIDER_SITE_OTHER): Payer: 59

## 2018-05-18 DIAGNOSIS — R42 Dizziness and giddiness: Secondary | ICD-10-CM

## 2018-05-19 ENCOUNTER — Telehealth: Payer: Self-pay | Admitting: Cardiovascular Disease

## 2018-05-19 NOTE — Telephone Encounter (Signed)
Please call pt regarding scheduling his cardiac CTA. Pt request the call be returned on Monday

## 2018-05-24 ENCOUNTER — Other Ambulatory Visit (INDEPENDENT_AMBULATORY_CARE_PROVIDER_SITE_OTHER): Payer: 59

## 2018-05-24 ENCOUNTER — Inpatient Hospital Stay: Payer: 59 | Admitting: Internal Medicine

## 2018-05-24 ENCOUNTER — Other Ambulatory Visit: Payer: Self-pay | Admitting: *Deleted

## 2018-05-24 DIAGNOSIS — Z79899 Other long term (current) drug therapy: Secondary | ICD-10-CM

## 2018-05-24 LAB — COMPREHENSIVE METABOLIC PANEL
ALT: 25 U/L (ref 0–53)
AST: 22 U/L (ref 0–37)
Albumin: 4.3 g/dL (ref 3.5–5.2)
Alkaline Phosphatase: 95 U/L (ref 39–117)
BILIRUBIN TOTAL: 0.4 mg/dL (ref 0.2–1.2)
BUN: 22 mg/dL (ref 6–23)
CHLORIDE: 97 meq/L (ref 96–112)
CO2: 34 meq/L — AB (ref 19–32)
Calcium: 9.8 mg/dL (ref 8.4–10.5)
Creatinine, Ser: 1.13 mg/dL (ref 0.40–1.50)
GFR: 70.43 mL/min (ref >60.00)
GLUCOSE: 92 mg/dL (ref 70–99)
Potassium: 3.6 mEq/L (ref 3.5–5.1)
Sodium: 138 mEq/L (ref 135–145)
Total Protein: 7.5 g/dL (ref 6.0–8.3)

## 2018-05-24 NOTE — Patient Outreach (Signed)
[facility]) Care Management  05/24/2018  PRATEEK KNIPPLE 08-23-1956 [id number]   No response from patient outreach attempts will proceed with case closure.    Objective:Per KPN (Knowledge Performance Now, point of care tool) and chart review,patient hospitalized 05/08/18 -05/10/18 for CVA and TIA. Patient also has a history of hypertension and hyperlipidemia.      Assessment: Received UMR Transition of care referral on 05/11/18.Transition of care follow up not completed due to unable to contact patient and will proceed with case closure.      Plan:Case closure due to unable to reach.        Jaegar Croft H. Annia Friendly, BSN, [place] Management [facility] Telephonic CM Phone: [phone] Fax: [phone]

## 2018-05-24 NOTE — Telephone Encounter (Signed)
I called and spoke with the patient. He had called Friday and was waiting on a call to schedule his cardiac CTA. He states this has been scheduled.  His Cardiac CTA is 7/18 He see Dr. Rockey Situ on 7/26 He is currently wearing his heart monitor. He has no further questions at this time.

## 2018-05-31 ENCOUNTER — Ambulatory Visit (INDEPENDENT_AMBULATORY_CARE_PROVIDER_SITE_OTHER): Payer: 59 | Admitting: Internal Medicine

## 2018-05-31 ENCOUNTER — Encounter: Payer: Self-pay | Admitting: Internal Medicine

## 2018-05-31 VITALS — BP 138/78 | HR 61 | Resp 16 | Ht 70.0 in | Wt 279.0 lb

## 2018-05-31 DIAGNOSIS — G4719 Other hypersomnia: Secondary | ICD-10-CM | POA: Diagnosis not present

## 2018-05-31 NOTE — Progress Notes (Signed)
[name] [facility] Consultation      Assessment and Plan:  Excessive daytime sleepiness. -Symptoms and signs of obstructive sleep apnea. - We will send for sleep study.  History of stroke, essential hypertension, obesity. - Obstructive of sleep apnea can contribute to above conditions, therefore treatment of sleep apnea is an important part of their management.  Orders Placed This Encounter  Procedures  . Home sleep test   Return in about 2 months (around 07/31/2018).   Date: 05/31/2018  MRN# [id number] Tyrone Mcdowell December 16, 1955    Tyrone Mcdowell is a 62 y.o. old male seen in consultation for chief complaint of:    Chief Complaint  Patient presents with  . Consult    Referred by Dr. Rockey Situ snoring excessive daytime sleepiness    HPI:   The patient is a 63 year old male with a history of hypertension he was discharged from the hospital recently on 05/10/2018, after work-up for dizziness which included MRI and echocardiogram which were unremarkable.  Patient was thought to have had a history of stroke.  He was seen by cardiology at that time, who recommended outpatient sleep study. He is present with his wife, he snores at night and has witnessed apneas, he is sleepy during the day. He may fall asleep if he is sitting still. He has gained about 30 pounds in 2 years.  He has not have a sleep study in the past.  His dad and brother both have CPAP.  Goes to bed between 10 and 12; wakes at 6 am.   **Echocardiogram, 05/09/18>> EF 60%   PMHX:   Past Medical History:  Diagnosis Date  . Anxiety    takes Valium as needed  . Arthritis   . Chronic back pain    spondylolisthesis  . Depression    takes Zoloft daily  . Hyperlipidemia    takes Atorvastatin daily  . Hypertension    takes Micardis and AMlodipine daily  . Joint pain   . Joint swelling   . Peripheral edema    takes Lasix daily  . Urinary frequency    takes Rapaflo daily  . Weakness    numbness  and tingling   Surgical Hx:  Past Surgical History:  Procedure Laterality Date  . BACK SURGERY  04/2016  . COLONOSCOPY    . TIBIA FRACTURE SURGERY Bilateral 1989   multiple surgeries   . TIBIA HARDWARE REMOVAL  1989   Family Hx:  Family History  Problem Relation Age of Onset  . Cancer Mother        lung Ca, tobacco abuse  . CAD Mother        MI at age 55  . Stroke Father   . Hypertension Father   . Heart disease Father        pacemaker, heart failure  . Kidney disease Father   . Ulcerative colitis Father   . Cancer Sister 71       colon CA  . Cancer Other        colon CA at age 37  . Ulcerative colitis Brother    Social Hx:   Social History   Tobacco Use  . Smoking status: Former Smoker    Packs/day: 0.00    Years: 34.00    Pack years: 0.00    Types: Cigarettes    Start date: 1979    Last attempt to quit: 2015    Years since quitting: 4.4  . Smokeless tobacco: Current User  Types: Chew  . Tobacco comment: 34 year pack history  Substance Use Topics  . Alcohol use: No    Alcohol/week: 0.0 oz    Comment: no alcohol in over a yr  . Drug use: No   Medication:    Current Outpatient Medications:  .  amLODipine (NORVASC) 10 MG tablet, TAKE 1 TABLET BY MOUTH DAILY (Patient taking differently: TAKE 1 TABLET (10MG ) BY MOUTH EVERY EVENING), Disp: 90 tablet, Rfl: 1 .  aspirin 325 MG tablet, Take 1 tablet (325 mg total) by mouth daily., Disp: 90 tablet, Rfl: 0 .  atorvastatin (LIPITOR) 20 MG tablet, Take 1 tablet (20 mg total) by mouth daily at 6 PM., Disp: 30 tablet, Rfl: 0 .  clopidogrel (PLAVIX) 75 MG tablet, Take 1 tablet (75 mg total) by mouth daily., Disp: 90 tablet, Rfl: 0 .  DULoxetine (CYMBALTA) 30 MG capsule, TAKE 1 CAPSULE (30 MG TOTAL) BY MOUTH DAILY. INCREASE TO TWICE DAILY AFTER 2 WEEKS (Patient taking differently: Take 30 mg by mouth 2 (two) times daily. ), Disp: 180 capsule, Rfl: 1 .  furosemide (LASIX) 20 MG tablet, TAKE 1 TABLET BY MOUTH DAILY (Patient  taking differently: TAKE 1 TABLET (20MG ) BY MOUTH DAILY AS NEEDED FOR SWELLING), Disp: 90 tablet, Rfl: 2 .  gabapentin (NEURONTIN) 100 MG capsule, TAKE 2 CAPSULES BY MOUTH 3 TIMES DAILY. (Patient taking differently: TAKE 2 CAPSULES (200MG ) BY MOUTH 3 TIMES DAILY.), Disp: 180 capsule, Rfl: 2 .  hydrochlorothiazide (HYDRODIURIL) 25 MG tablet, TAKE 1 TABLET (25 MG TOTAL) BY MOUTH DAILY., Disp: 90 tablet, Rfl: 1 .  metoprolol tartrate (LOPRESSOR) 50 MG tablet, Take 1 tablet (50 mg) by mouth one hour before procedure., Disp: 1 tablet, Rfl: 0 .  tizanidine (ZANAFLEX) 6 MG capsule, TAKE 1 CAPSULE BY MOUTH 3 TIMES DAILY. (Patient taking differently: TAKE 1 CAPSULE (6MG ) BY MOUTH 3 TIMES DAILY AS NEEDED FOR MUSCLE SPASMS.), Disp: 90 capsule, Rfl: 1 .  traMADol (ULTRAM) 50 MG tablet, Take 1 tablet (50 mg total) by mouth every 6 (six) hours as needed. For chronic back and leg pain, Disp: 120 tablet, Rfl: 5 .  valsartan (DIOVAN) 160 MG tablet, TAKE 1 TABLET BY MOUTH DAILY (Patient taking differently: TAKE 1 TABLET (160MG ) BY MOUTH DAILY), Disp: 90 tablet, Rfl: 1   Allergies:  Patient has no known allergies.  Review of Systems: Gen:  Denies  fever, sweats, chills HEENT: Denies blurred vision, double vision. bleeds, sore throat Cvc:  No dizziness, chest pain. Resp:   Denies cough or sputum production, shortness of breath Gi: Denies swallowing difficulty, stomach pain. Gu:  Denies bladder incontinence, burning urine Ext:   No Joint pain, stiffness. Skin: No skin rash,  hives  Endoc:  No polyuria, polydipsia. Psych: No depression, insomnia. Other:  All other systems were reviewed with the patient and were negative other that what is mentioned in the HPI.   Physical Examination:   VS: BP 138/78 (BP Location: Left Arm, Cuff Size: Large)   Pulse 61   Resp 16   Ht 5\' 10"  (1.778 m)   Wt 279 lb (126.6 kg)   SpO2 96%   BMI 40.03 kg/m   General Appearance: No distress  Neuro:without focal findings,   speech normal,  HEENT: PERRLA, EOM intact.   Pulmonary: normal breath sounds, No wheezing.  CardiovascularNormal S1,S2.  No m/r/g.   Abdomen: Benign, Soft, non-tender. Renal:  No costovertebral tenderness  GU:  No performed at this time. Endoc: No evident thyromegaly, no signs  of acromegaly. Skin:   warm, no rashes, no ecchymosis  Extremities: normal, no cyanosis, clubbing.  Other findings:    LABORATORY PANEL:   CBC No results for input(s): WBC, HGB, HCT, PLT in the last 168 hours. ------------------------------------------------------------------------------------------------------------------  Chemistries  No results for input(s): NA, K, CL, CO2, GLUCOSE, BUN, CREATININE, CALCIUM, MG, AST, ALT, ALKPHOS, BILITOT in the last 168 hours.  Invalid input(s): GFRCGP ------------------------------------------------------------------------------------------------------------------  Cardiac Enzymes No results for input(s): TROPONINI in the last 168 hours. ------------------------------------------------------------  RADIOLOGY:  No results found.     Thank  you for the consultation and for allowing [facility] to assist in the care of your patient. Our recommendations are noted above.  Please contact us if we can be of further service.   Marda Stalker, M.D., F.C.C.P.  Board Certified in Internal Medicine, Pulmonary Medicine, [place], and Sleep Medicine.  [facility] Office Number: [phone]   05/31/2018

## 2018-05-31 NOTE — Patient Instructions (Addendum)
Will send for sleep study.    

## 2018-06-12 ENCOUNTER — Ambulatory Visit: Payer: 59 | Admitting: Internal Medicine

## 2018-06-12 ENCOUNTER — Encounter: Payer: Self-pay | Admitting: Internal Medicine

## 2018-06-14 ENCOUNTER — Ambulatory Visit: Payer: 59 | Admitting: Adult Health

## 2018-06-14 ENCOUNTER — Encounter: Payer: Self-pay | Admitting: Adult Health

## 2018-06-14 ENCOUNTER — Ambulatory Visit: Payer: 59 | Admitting: Cardiovascular Disease

## 2018-06-14 VITALS — BP 104/63 | HR 55 | Ht 70.0 in | Wt 275.0 lb

## 2018-06-14 DIAGNOSIS — E785 Hyperlipidemia, unspecified: Secondary | ICD-10-CM

## 2018-06-14 DIAGNOSIS — I10 Essential (primary) hypertension: Secondary | ICD-10-CM

## 2018-06-14 DIAGNOSIS — G459 Transient cerebral ischemic attack, unspecified: Secondary | ICD-10-CM

## 2018-06-14 MED ORDER — CLOPIDOGREL BISULFATE 75 MG PO TABS: 75.0000 mg | ORAL_TABLET | Freq: Every day | ORAL | 3 refills | Status: DC

## 2018-06-14 MED ORDER — ASPIRIN 325 MG PO TABS: 325.0000 mg | ORAL_TABLET | Freq: Every day | ORAL | 0 refills | Status: DC

## 2018-06-14 NOTE — Progress Notes (Signed)
I agree with the above plan 

## 2018-06-14 NOTE — Patient Instructions (Addendum)
Continue aspirin 325 mg daily and clopidogrel 75 mg daily  and lipitor  for secondary stroke prevention  Stop aspirin 325mg  after 08/08/18 and continue plavix only  Follow up with heart monitor once completed  Follow up with sleep study scheduling   CT coronary scan 06/22/18  Continue to follow up with PCP regarding cholesterol and blood pressure management   Continue to monitor blood pressure at home  Maintain strict control of hypertension with blood pressure goal below 130/90, diabetes with hemoglobin A1c goal below 6.5% and cholesterol with LDL cholesterol (bad cholesterol) goal below 70 mg/dL. I also advised the patient to eat a healthy diet with plenty of whole grains, cereals, fruits and vegetables, exercise regularly and maintain ideal body weight.  Followup in the future with me in 6 months or call earlier if needed        Thank you for coming to see [place] at [facility]. I hope we have been able to provide you high quality care today.  You may receive a patient satisfaction survey over the next few weeks. We would appreciate your feedback and comments so that we may continue to improve ourselves and the health of our patients.

## 2018-06-14 NOTE — Progress Notes (Signed)
[facility] [address] [place]. [place] [address] [phone]       OFFICE FOLLOW UP NOTE  Mr. Tyrone Mcdowell Date of Birth:  1956/03/03 Medical Record Number:  [id number]   Reason for Referral:  hospital follow up  CHIEF COMPLAINT:  Chief Complaint  Patient presents with  . Follow-up    pt with wife, following up from hospital visit. no concerns.     HPI: Tyrone Mcdowell is being seen today for initial visit in the office for TIA vs presyncope vs HTN urgerncy on 05/08/18. History obtained from patient and chart review. Reviewed all radiology images and labs personally.  Tyrone Mcdowell is a 62 year old male with history of lower back pain, depression, hyperlipidemia and hypertension who was admitted for 3 episode of disequilibrium, lightheadedness, diaphoresis, pale looking and one-time falling and nausea vomiting. Symptoms lasted 3 to 5 minutes. He denied vertigo feeling and also denied any headache associate with the episodes, no ear ringing or hearing loss, no recent infection.Episodes not consistently associate with motion. Denies chest pain, shortness breath,or loss of consciousness. CT and MRI unremarkable. 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 LDL 113 and was started on Lipitor 20mg  and A1c 5.5.UDS negative. EF 60-65%.  Patient presentation atypical, however, differential diagnoses include posterior TIA, presyncope, hypertensive urgencyor vestibular proxysmia.Cardiology consulted while inpatient and recommended outpt 30 day cardiac event monitoring, CTA coronary artery and sleep study. Given bilateral vertebral artery stenosis, will treat with aspirin 325 and Plavix 75 DAPT for 3 months and then Plavix alone. Discharged in stable condition.   Patient is being seen today for hospital follow-up and is accompanied by his wife.  He states he has been doing well without further episodes since hospital discharge.  He  has changed his diet where he has been trying to eat healthier and staying active.  He continues DAPT with aspirin and Plavix without side effects of bleeding or bruising.  Continues Lipitor without side effects of myalgias.  He is currently wearing a 30-day cardiac monitor which will be completed on 06/16/2018.  He is awaiting scheduling for sleep study for insurance approval reasons.  He has CT coronary artery scan ordered on 06/22/2018.  Blood pressure today 104/63 and per patient and wife this is much lower than it normally is.  Typical SBP 1 30-1 40.  Denies new or worsening stroke/TIA symptoms.   ROS:   14 system review of systems performed and negative with exception of no complaints  PMH:  Past Medical History:  Diagnosis Date  . Anxiety    takes Valium as needed  . Arthritis   . Chronic back pain    spondylolisthesis  . Depression    takes Zoloft daily  . Hyperlipidemia    takes Atorvastatin daily  . Hypertension    takes Micardis and AMlodipine daily  . Joint pain   . Joint swelling   . Peripheral edema    takes Lasix daily  . Urinary frequency    takes Rapaflo daily  . Weakness    numbness and tingling    PSH:  Past Surgical History:  Procedure Laterality Date  . BACK SURGERY  04/2016  . COLONOSCOPY    . TIBIA FRACTURE SURGERY Bilateral 1989   multiple surgeries   . TIBIA HARDWARE REMOVAL  1989    Social History:  Social History   Socioeconomic History  . Marital status: Married    Spouse name: Not on file  .  Number of children: Not on file  . Years of education: Not on file  . Highest education level: Not on file  Occupational History  . Not on file  Social Needs  . Financial resource strain: Not on file  . Food insecurity:    Worry: Not on file    Inability: Not on file  . Transportation needs:    Medical: Not on file    Non-medical: Not on file  Tobacco Use  . Smoking status: Former Smoker    Packs/day: 0.00    Years: 34.00    Pack years:  0.00    Types: Cigarettes    Start date: 1979    Last attempt to quit: 2015    Years since quitting: 4.5  . Smokeless tobacco: Current User    Types: Chew  . Tobacco comment: 34 year pack history  Substance and Sexual Activity  . Alcohol use: No    Alcohol/week: 0.0 oz    Comment: no alcohol in over a yr  . Drug use: No  . Sexual activity: Not Currently  Lifestyle  . Physical activity:    Days per week: Not on file    Minutes per session: Not on file  . Stress: Not on file  Relationships  . Social connections:    Talks on phone: Not on file    Gets together: Not on file    Attends religious service: Not on file    Active member of club or organization: Not on file    Attends meetings of clubs or organizations: Not on file    Relationship status: Not on file  . Intimate partner violence:    Fear of current or ex partner: Not on file    Emotionally abused: Not on file    Physically abused: Not on file    Forced sexual activity: Not on file  Other Topics Concern  . Not on file  Social History Narrative   Tyrone Mcdowell is married with 2 grown children and 2 grandchildren. He is on disability for back and leg pain.     Family History:  Family History  Problem Relation Age of Onset  . Cancer Mother        lung Ca, tobacco abuse  . CAD Mother        MI at age 73  . Stroke Father   . Hypertension Father   . Heart disease Father        pacemaker, heart failure  . Kidney disease Father   . Ulcerative colitis Father   . Cancer Sister 77       colon CA  . Cancer Other        colon CA at age 66  . Ulcerative colitis Brother     Medications:   Current Outpatient Medications on File Prior to Visit  Medication Sig Dispense Refill  . amLODipine (NORVASC) 10 MG tablet TAKE 1 TABLET BY MOUTH DAILY (Patient taking differently: TAKE 1 TABLET (10MG ) BY MOUTH EVERY EVENING) 90 tablet 1  . atorvastatin (LIPITOR) 20 MG tablet Take 1 tablet (20 mg total) by mouth daily at 6 PM. 30  tablet 0  . DULoxetine (CYMBALTA) 30 MG capsule TAKE 1 CAPSULE (30 MG TOTAL) BY MOUTH DAILY. INCREASE TO TWICE DAILY AFTER 2 WEEKS (Patient taking differently: Take 30 mg by mouth 2 (two) times daily. ) 180 capsule 1  . furosemide (LASIX) 20 MG tablet TAKE 1 TABLET BY MOUTH DAILY (Patient taking differently: TAKE 1 TABLET (  20MG ) BY MOUTH DAILY AS NEEDED FOR SWELLING) 90 tablet 2  . gabapentin (NEURONTIN) 100 MG capsule TAKE 2 CAPSULES BY MOUTH 3 TIMES DAILY. (Patient taking differently: TAKE 2 CAPSULES (200MG ) BY MOUTH 3 TIMES DAILY.) 180 capsule 2  . hydrochlorothiazide (HYDRODIURIL) 25 MG tablet TAKE 1 TABLET (25 MG TOTAL) BY MOUTH DAILY. 90 tablet 1  . metoprolol tartrate (LOPRESSOR) 50 MG tablet Take 1 tablet (50 mg) by mouth one hour before procedure. 1 tablet 0  . tizanidine (ZANAFLEX) 6 MG capsule TAKE 1 CAPSULE BY MOUTH 3 TIMES DAILY. (Patient taking differently: TAKE 1 CAPSULE (6MG ) BY MOUTH 3 TIMES DAILY AS NEEDED FOR MUSCLE SPASMS.) 90 capsule 1  . traMADol (ULTRAM) 50 MG tablet Take 1 tablet (50 mg total) by mouth every 6 (six) hours as needed. For chronic back and leg pain 120 tablet 5  . valsartan (DIOVAN) 160 MG tablet TAKE 1 TABLET BY MOUTH DAILY (Patient taking differently: TAKE 1 TABLET (160MG ) BY MOUTH DAILY) 90 tablet 1   No current facility-administered medications on file prior to visit.     Allergies:  No Known Allergies   Physical Exam  Vitals:   06/14/18 0753  BP: 104/63  Pulse: (!) 55  Weight: 275 lb (124.7 kg)  Height: 5\' 10"  (1.778 m)   Body mass index is 39.46 kg/m. No exam data present  General: well developed, well nourished, pleasant obese middle-aged male, seated, in no evident distress Head: head normocephalic and atraumatic.   Neck: supple with no carotid or supraclavicular bruits Cardiovascular: regular rate and rhythm, no murmurs Musculoskeletal: no deformity Skin:  no rash/petichiae Vascular:  Normal pulses all extremities  Neurologic  Exam Mental Status: Awake and fully alert. Oriented to place and time. Recent and remote memory intact. Attention span, concentration and fund of knowledge appropriate. Mood and affect appropriate.  Cranial Nerves: Fundoscopic exam reveals sharp disc margins. Pupils equal, briskly reactive to light. Extraocular movements full without nystagmus. Visual fields full to confrontation. Hearing intact. Facial sensation intact. Face, tongue, palate moves normally and symmetrically.  Motor: Normal bulk and tone. Normal strength in all tested extremity muscles. Sensory.: intact to touch , pinprick , position and vibratory sensation.  Coordination: Rapid alternating movements normal in all extremities. Finger-to-nose and heel-to-shin performed accurately bilaterally. Gait and Station: Arises from chair without difficulty. Stance is normal. Gait demonstrates normal stride length and balance . Able to heel, toe and tandem walk with mild difficulty.  Reflexes: 1+ and symmetric. Toes downgoing.    NIHSS  0 Modified Rankin  0   Diagnostic Data (Labs, Imaging, Testing)  CT HEAD WO CONTRAST 05/08/18 IMPRESSION: 1. No acute intracranial abnormality. 2. 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 3. Atrophy and white matter changes are mildly advanced for age. The finding is nonspecific but can be seen in the setting of chronic microvascular ischemia, a demyelinating process such as multiple sclerosis, vasculitis, complicated migraine headaches, or as the sequelae of a prior infectious or inflammatory process. 4. ASPECTS is 10/10  CT ANGIO HEAD/NECK W OR WO CONTRAST 05/08/18 IMPRESSION: Carotid bifurcation atherosclerosis but no stenosis or irregularity. No intracranial anterior circulation abnormality. Abnormal appearance of the proximal vertebral arteries bilaterally. I think the appearance is more consistent with advanced atherosclerotic disease,  with narrowing and irregularity. I cannot exclude the possibility of vertebral artery dissection on either or both sides however. In the upper cervical region, the vertebral arteries regain a normal appearance in  the vessels are widely patent through the foramen magnum to the basilar, without identifiable intracranial posterior circulation abnormality.  MR BRAIN WO CONTRAST 05/09/2018 IMPRESSION: 1.  No acute intracranial process. 2. Old right basal ganglia infarct. Mild chronic small vessel ischemic changes.   ECHOCARDIOGRAM 05/09/2018 Study Conclusions - Left ventricle: The cavity size was normal. Systolic function was   normal. The estimated ejection fraction was in the range of 60%   to 65%. Wall motion was normal; there were no regional wall   motion abnormalities. Left ventricular diastolic function   parameters were normal.      ASSESSMENT: Tyrone Mcdowell is a 62 y.o. year old male here with TIA vs presyncope vs hypertensive emergency on 05/08/18. Vascular risk factors include HTN and HLD.     PLAN: -Continue aspirin 325 mg daily and clopidogrel 75 mg daily  and Lipitor for secondary stroke prevention -Advised to stop aspirin 325 and continue Plavix only after 08/08/2018 for a total of 3 months DAPT -F/u with PCP regarding your HLD and HTN management -continue to monitor BP at home -Advised to follow-up with sleep study scheduling -Follow-up on cardiac monitor results -CT coronary artery scan 06/22/2018 -Continue to eat a healthy diet and increase activity and exercise -Maintain strict control of hypertension with blood pressure goal below 130/90, diabetes with hemoglobin A1c goal below 6.5% and cholesterol with LDL cholesterol (bad cholesterol) goal below 70 mg/dL. I also advised the patient to eat a healthy diet with plenty of whole grains, cereals, fruits and vegetables, exercise regularly and maintain ideal body weight.  Follow up in 6 months or call earlier if  needed  Greater than 50% of time during this 25 minute visit was spent on counseling,explanation of diagnosis of possible TIA versus presyncope versus hypertensive emergency, reviewing risk factor management of HLD and HTN, planning of further management, discussion with patient and family and coordination of care  Venancio Poisson, [facility]  [facility] [address] [place], [place] [address]  Phone [phone] Fax [phone]

## 2018-06-16 ENCOUNTER — Other Ambulatory Visit: Payer: Self-pay

## 2018-06-16 MED ORDER — ATORVASTATIN CALCIUM 20 MG PO TABS: 20.0000 mg | ORAL_TABLET | Freq: Every day | ORAL | 0 refills | Status: DC

## 2018-06-16 MED ORDER — ATORVASTATIN CALCIUM 20 MG PO TABS: 20.0000 mg | ORAL_TABLET | Freq: Every day | ORAL | 3 refills | Status: DC

## 2018-06-16 NOTE — Progress Notes (Signed)
Pts wife calling to check status on the atorvastatin (LIPITOR) 20 MG tablet  Being refilled.

## 2018-06-19 NOTE — Telephone Encounter (Signed)
Medication was refilled on 06/16/2018

## 2018-06-20 ENCOUNTER — Other Ambulatory Visit: Payer: Self-pay | Admitting: Internal Medicine

## 2018-06-22 ENCOUNTER — Ambulatory Visit (HOSPITAL_COMMUNITY)
Admission: RE | Admit: 2018-06-22 | Discharge: 2018-06-22 | Disposition: A | Discharge status: Home / Self Care | Payer: 59 | Source: Ambulatory Visit | Attending: Cardiology | Admitting: Cardiology

## 2018-06-22 ENCOUNTER — Telehealth: Payer: Self-pay

## 2018-06-22 DIAGNOSIS — R079 Chest pain, unspecified: Secondary | ICD-10-CM | POA: Insufficient documentation

## 2018-06-22 MED ORDER — IOPAMIDOL (ISOVUE-370) INJECTION 76%
100.0000 mL | Freq: Once | INTRAVENOUS | Status: AC | PRN
  Administered 2018-06-22: 100 mL via INTRAVENOUS

## 2018-06-22 MED ORDER — NITROGLYCERIN 0.4 MG SL SUBL
SUBLINGUAL_TABLET | SUBLINGUAL | Status: AC
  Administered 2018-06-22: 0.8 mg
  Filled 2018-06-22: qty 2

## 2018-06-22 MED ORDER — IOPAMIDOL (ISOVUE-370) INJECTION 76%
INTRAVENOUS | Status: AC
  Filled 2018-06-22: qty 100

## 2018-06-22 NOTE — Telephone Encounter (Signed)
Notes recorded by Frederik Schmidt, RN on 06/22/2018 at 12:19 PM EDT Reviewed CT results with patient and recommendations. He verbalized understanding.

## 2018-06-22 NOTE — Telephone Encounter (Signed)
-----   Message from Daune Perch, NP sent at 06/22/2018 11:57 AM EDT ----- Please let the patient know that his cardiac CT showed normal coronary arteries and coronary calcium score of 0 which indicates that his risk of cardiac event in the next 5 years is very low.  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  Please send copy to [name], MD  Daune Perch, NP

## 2018-06-29 ENCOUNTER — Telehealth: Payer: Self-pay | Admitting: Cardiovascular Disease

## 2018-06-29 NOTE — Telephone Encounter (Signed)
Patient wants to know if it is necessary for him to come to appt tomorrow since he has already been given and advised about testing results . Appt is tomorrow with Rockey Situ please call

## 2018-06-29 NOTE — Telephone Encounter (Signed)
Patient has been advised to come to his appointment tomorrow. He verbalized his understanding.

## 2018-06-29 NOTE — Progress Notes (Signed)
Cardiology Office Note  Date:  06/30/2018   ID:  Tyrone, Mcdowell 03-20-1956, MRN [id number]  PCP:  [name], MD   Chief Complaint  Patient presents with  . other    Follow up from [facility] & discuss 30 day event monitor. Meds reviewed by the pt. verbally. "doing well."    HPI:  [name] is a 62 yo gentleman with past medical history of Smoker Hyperlipidemia Hypertension  [facility] ED on 05/08/2018 for evaluation of dizziness, shortness of breath and chest pain B/l leg surgery, in 1980 Who presents for follow-up of his cardiac issues, dizziness   lightheadedness on 3 occasions This has occurred while seated, driving and on Sunday it occurred while walking down his driveway. On the Sunday occasion he felt like he would pass out and went down to the ground. He did not lose consciousness. He was able to get up but was very weak and wobbly as he walked into the house to get his wife. His symptoms were no frank dizziness like room spinning but "felt like being on a rocking ship" with loss of equilibrium. Lasts one minute    CT and MRI of the head are negative for CVA.   small old basal ganglia infarct.   Carotid CT showed carotid bifurcation atherosclerosis but no stenosis or irregularity. advanced atherosclerotic disease in the vertebral arteries.    [name] is on disability for bilateral leg and back issues.   back surgery about 2 years ago   outpatient cardiac CTA 1. Calcium score 0 2.  Mild aortic root dilatation 4.0 cm 3.  Normal co dominant coronary arteries  Outpatient sleep study  Outpatient event monitor No arrhythmia, did get dizzy " twice", "  Echocardiogram:  normal ejection fraction greater than 60%   positive family cardiac history in his mother having had an MI at age 58 and his maternal grandfather had a pacemaker thought to be related to bradycardia.      PMH:   has a past medical history of Anxiety, Arthritis, Chronic back pain,  Depression, Hyperlipidemia, Hypertension, Joint pain, Joint swelling, Peripheral edema, Urinary frequency, and Weakness.  PSH:    Past Surgical History:  Procedure Laterality Date  . BACK SURGERY  04/2016  . COLONOSCOPY    . TIBIA FRACTURE SURGERY Bilateral 1989   multiple surgeries   . TIBIA HARDWARE REMOVAL  1989    Current Outpatient Medications  Medication Sig Dispense Refill  . amLODipine (NORVASC) 10 MG tablet TAKE 1 TABLET BY MOUTH DAILY (Patient taking differently: TAKE 1 TABLET (10MG ) BY MOUTH EVERY EVENING) 90 tablet 1  . aspirin 81 MG tablet Take 1 tablet (81 mg total) by mouth daily. 90 tablet 0  . atorvastatin (LIPITOR) 20 MG tablet Take 1 tablet (20 mg total) by mouth daily at 6 PM. 30 tablet 3  . clopidogrel (PLAVIX) 75 MG tablet Take 1 tablet (75 mg total) by mouth daily. 90 tablet 3  . DULoxetine (CYMBALTA) 30 MG capsule TAKE 1 CAPSULE (30 MG TOTAL) BY MOUTH DAILY. INCREASE TO TWICE DAILY AFTER 2 WEEKS (Patient taking differently: Take 30 mg by mouth 2 (two) times daily. ) 180 capsule 1  . furosemide (LASIX) 20 MG tablet Take 1 tablet (20 mg total) by mouth daily as needed. 90 tablet 2  . gabapentin (NEURONTIN) 100 MG capsule TAKE 2 CAPSULES BY MOUTH 3 TIMES DAILY. 180 capsule 2  . hydrochlorothiazide (HYDRODIURIL) 25 MG tablet TAKE 1 TABLET (25 MG TOTAL)  BY MOUTH DAILY. 90 tablet 1  . tizanidine (ZANAFLEX) 6 MG capsule TAKE 1 CAPSULE BY MOUTH 3 TIMES DAILY. (Patient taking differently: TAKE 1 CAPSULE (6MG ) BY MOUTH 3 TIMES DAILY AS NEEDED FOR MUSCLE SPASMS.) 90 capsule 1  . traMADol (ULTRAM) 50 MG tablet Take 1 tablet (50 mg total) by mouth every 6 (six) hours as needed. For chronic back and leg pain 120 tablet 5  . valsartan (DIOVAN) 160 MG tablet TAKE 1 TABLET BY MOUTH DAILY (Patient taking differently: TAKE 1 TABLET (160MG ) BY MOUTH DAILY) 90 tablet 1   No current facility-administered medications for this visit.      Allergies:   Patient has no known allergies.    Social History:  The patient  reports that he quit smoking about 4 years ago. His smoking use included cigarettes. He started smoking about 40 years ago. He smoked 0.00 packs per day for 34.00 years. His smokeless tobacco use includes chew. He reports that he does not drink alcohol or use drugs.   Family History:   family history includes CAD in his mother; Cancer in his mother and other; Cancer (age of onset: 85) in his sister; Heart disease in his father; Hypertension in his father; Kidney disease in his father; Stroke in his father; Ulcerative colitis in his brother and father.    Review of Systems: Review of Systems  Constitutional: Negative.   Respiratory: Negative.   Cardiovascular: Negative.   Gastrointestinal: Negative.   Musculoskeletal: Negative.        Chronic leg pain bilaterally  Neurological: Negative.   Psychiatric/Behavioral: Negative.   All other systems reviewed and are negative.    PHYSICAL EXAM: VS:  BP 120/68 (BP Location: Left Arm, Patient Position: Sitting, Cuff Size: Large)   Pulse (!) 56   Ht 5\' 10"  (1.778 m)   Wt 276 lb 12 oz (125.5 kg)   BMI 39.71 kg/m  , BMI Body mass index is 39.71 kg/m. GEN: Well nourished, well developed, in no acute distress  HEENT: normal  Neck: no JVD, carotid bruits, or masses Cardiac: RRR; no murmurs, rubs, or gallops,no edema  Respiratory:  clear to auscultation bilaterally, normal work of breathing GI: soft, nontender, nondistended, + BS MS: no deformity or atrophy  Skin: warm and dry, no rash Neuro:  Strength and sensation are intact Psych: euthymic mood, full affect  Recent Labs: 11/07/2017: TSH 1.21 05/08/2018: Hemoglobin 13.6; Platelets 325 05/24/2018: ALT 25; BUN 22; Creatinine, Ser 1.13; Potassium 3.6; Sodium 138    Lipid Panel Lab Results  Component Value Date   CHOL 170 05/09/2018   HDL 35 (L) 05/09/2018   LDLCALC 113 (H) 05/09/2018   TRIG 111 05/09/2018      Wt Readings from Last 3 Encounters:   06/30/18 276 lb 12 oz (125.5 kg)  06/14/18 275 lb (124.7 kg)  05/31/18 279 lb (126.6 kg)       ASSESSMENT AND PLAN:  Cerebrovascular accident (CVA), unspecified mechanism ([place]) - Plan: EKG 12-Lead Discharged on Plavix and high-dose aspirin He was told to decrease aspirin down to 81 mg daily beginning of September  Stenosis of both vertebral arteries - Plan: EKG 12-Lead Discussed results with him Goal LDL less than 70 Currently on aspirin and Plavix Lipitor  Venous insufficiency of lower extremity, unspecified laterality Recommended compression hose He has them at home  Essential hypertension Blood pressure is well controlled on today's visit. No changes made to the medications.  History of tobacco abuse We have encouraged him  to continue to work on weaning his cigarettes and smoking cessation. He will continue to work on this and does not want any assistance with chantix.   Hyperlipidemia LDL goal <70 Currently on Lipitor Would prefer goal LDL less than 70  Dilated aortic root No need periodic echocardiogram every several years for evaluation Likely benign finding  Disposition:   F/U as needed  extensive review of recent hospital records New patient to our office  Total encounter time more than 45 minutes  Greater than 50% was spent in counseling and coordination of care with the patient    Orders Placed This Encounter  Procedures  . EKG 12-Lead     Signed, Esmond Plants, M.D., Ph.D. 06/30/2018  [place], [place]

## 2018-06-30 ENCOUNTER — Ambulatory Visit: Payer: 59 | Admitting: Cardiovascular Disease

## 2018-06-30 ENCOUNTER — Encounter: Payer: Self-pay | Admitting: Cardiovascular Disease

## 2018-06-30 VITALS — BP 120/68 | HR 56 | Ht 70.0 in | Wt 276.8 lb

## 2018-06-30 DIAGNOSIS — E785 Hyperlipidemia, unspecified: Secondary | ICD-10-CM

## 2018-06-30 DIAGNOSIS — I10 Essential (primary) hypertension: Secondary | ICD-10-CM

## 2018-06-30 DIAGNOSIS — I872 Venous insufficiency (chronic) (peripheral): Secondary | ICD-10-CM

## 2018-06-30 DIAGNOSIS — Z87891 Personal history of nicotine dependence: Secondary | ICD-10-CM

## 2018-06-30 DIAGNOSIS — I6503 Occlusion and stenosis of bilateral vertebral arteries: Secondary | ICD-10-CM

## 2018-06-30 DIAGNOSIS — I639 Cerebral infarction, unspecified: Secondary | ICD-10-CM | POA: Diagnosis not present

## 2018-06-30 NOTE — Patient Instructions (Addendum)
Medication Instructions:   No medication changes made  If you continue to have dizzy spells, Stay hydrated, Try meclizine OTC  Labwork:  No new labs needed  Testing/Procedures:  No further testing at this time   Follow-Up: It was a pleasure seeing you in the office today. Please call us if you have new issues that need to be addressed before your next appt.  [phone]  Your physician wants you to follow-up in:  As needed  If you need a refill on your cardiac medications before your next appointment, please call your pharmacy.  For educational health videos Log in to : [email or web address] Or : [email or web address], password : [email or web address]

## 2018-07-03 ENCOUNTER — Other Ambulatory Visit: Payer: Self-pay | Admitting: Internal Medicine

## 2018-07-03 ENCOUNTER — Encounter: Payer: Self-pay | Admitting: Internal Medicine

## 2018-07-03 DIAGNOSIS — G4719 Other hypersomnia: Secondary | ICD-10-CM

## 2018-07-03 DIAGNOSIS — G4733 Obstructive sleep apnea (adult) (pediatric): Secondary | ICD-10-CM | POA: Diagnosis not present

## 2018-07-20 ENCOUNTER — Telehealth: Payer: Self-pay | Admitting: *Deleted

## 2018-07-20 DIAGNOSIS — G4733 Obstructive sleep apnea (adult) (pediatric): Secondary | ICD-10-CM

## 2018-07-20 NOTE — Telephone Encounter (Signed)
Pt aware of results. Orders placed  Nothing further needed. 

## 2018-07-21 ENCOUNTER — Other Ambulatory Visit: Payer: Self-pay | Admitting: Internal Medicine

## 2018-07-31 ENCOUNTER — Other Ambulatory Visit: Payer: Self-pay | Admitting: Internal Medicine

## 2018-07-31 NOTE — Progress Notes (Deleted)
[facility] Consultation      Assessment and Plan:  Excessive daytime sleepiness. -Symptoms and signs of obstructive sleep apnea. - We will send for sleep study.  History of stroke, essential hypertension, obesity. - Obstructive of sleep apnea can contribute to above conditions, therefore treatment of sleep apnea is an important part of their management.  No orders of the defined types were placed in this encounter.  No follow-ups on file.   Date: 07/31/2018  MRN# [id number] Tyrone Mcdowell [birth year]    Tyrone Mcdowell is a 61 y.o. old male seen in consultation for chief complaint of:    No chief complaint on file.   HPI:   The patient is a 62 year old male with a history of hypertension he was discharged from the hospital recently on 05/10/2018, after work-up for dizziness which included MRI and echocardiogram which were unremarkable.  Patient was thought to have had a history of stroke.  He was seen by cardiology at that time, who recommended outpatient sleep study. He is present with his wife, he snores at night and has witnessed apneas, he is sleepy during the day. He may fall asleep if he is sitting still. He has gained about 30 pounds in 2 years.  He has not have a sleep study in the past.  His dad and brother both have CPAP.  Goes to bed between 10 and 12; wakes at 6 am.   **Echocardiogram, 05/09/18>> EF 60%  Medication:    Current Outpatient Medications:  .  amLODipine (NORVASC) 10 MG tablet, TAKE 1 TABLET BY MOUTH DAILY, Disp: 90 tablet, Rfl: 1 .  aspirin 81 MG tablet, Take 1 tablet (81 mg total) by mouth daily., Disp: 90 tablet, Rfl: 0 .  atorvastatin (LIPITOR) 20 MG tablet, Take 1 tablet (20 mg total) by mouth daily at 6 PM., Disp: 30 tablet, Rfl: 3 .  clopidogrel (PLAVIX) 75 MG tablet, Take 1 tablet (75 mg total) by mouth daily., Disp: 90 tablet, Rfl: 3 .  DULoxetine (CYMBALTA) 30 MG capsule, TAKE 1 CAPSULE (30 MG TOTAL) BY MOUTH DAILY.  INCREASE TO TWICE DAILY AFTER 2 WEEKS, Disp: 180 capsule, Rfl: 1 .  furosemide (LASIX) 20 MG tablet, Take 1 tablet (20 mg total) by mouth daily as needed., Disp: 90 tablet, Rfl: 2 .  gabapentin (NEURONTIN) 100 MG capsule, TAKE 2 CAPSULES BY MOUTH 3 TIMES DAILY., Disp: 180 capsule, Rfl: 2 .  hydrochlorothiazide (HYDRODIURIL) 25 MG tablet, TAKE 1 TABLET (25 MG TOTAL) BY MOUTH DAILY., Disp: 90 tablet, Rfl: 1 .  tizanidine (ZANAFLEX) 6 MG capsule, TAKE 1 CAPSULE BY MOUTH 3 TIMES DAILY. (Patient taking differently: TAKE 1 CAPSULE (6MG ) BY MOUTH 3 TIMES DAILY AS NEEDED FOR MUSCLE SPASMS.), Disp: 90 capsule, Rfl: 1 .  traMADol (ULTRAM) 50 MG tablet, Take 1 tablet (50 mg total) by mouth every 6 (six) hours as needed. For chronic back and leg pain, Disp: 120 tablet, Rfl: 5 .  valsartan (DIOVAN) 160 MG tablet, TAKE 1 TABLET BY MOUTH DAILY, Disp: 90 tablet, Rfl: 1   Allergies:  Patient has no known allergies.       LABORATORY PANEL:   CBC No results for input(s): WBC, HGB, HCT, PLT in the last 168 hours. ------------------------------------------------------------------------------------------------------------------  Chemistries  No results for input(s): NA, K, CL, CO2, GLUCOSE, BUN, CREATININE, CALCIUM, MG, AST, ALT, ALKPHOS, BILITOT in the last 168 hours.  Invalid input(s): GFRCGP ------------------------------------------------------------------------------------------------------------------  Cardiac Enzymes No results for input(s): TROPONINI in the last 168  hours. ------------------------------------------------------------  RADIOLOGY:  No results found.     Thank  you for the consultation and for allowing [facility] to assist in the care of your patient. Our recommendations are noted above.  Please contact us if we can be of further service.   Marda Stalker, M.D., F.C.C.P.  Board Certified in Internal Medicine, Pulmonary Medicine, [place], and Sleep Medicine.  [facility] Office Number: [phone]   07/31/2018

## 2018-08-01 ENCOUNTER — Ambulatory Visit: Payer: 59 | Admitting: Internal Medicine

## 2018-08-03 DIAGNOSIS — G4733 Obstructive sleep apnea (adult) (pediatric): Secondary | ICD-10-CM | POA: Diagnosis not present

## 2018-08-10 ENCOUNTER — Telehealth: Payer: Self-pay | Admitting: Internal Medicine

## 2018-08-10 NOTE — Telephone Encounter (Signed)
Ok. We can offer pt referral to dentist for dental device.

## 2018-08-10 NOTE — Telephone Encounter (Signed)
Returned call to patient for complaint on cpap. Patient says he is claustrophobic.. I offered mask fit referral patient declines. I asked if too much air is blowing, he says no he just can't do cpap. Pt is going to turn machine back in.

## 2018-08-10 NOTE — Telephone Encounter (Signed)
Patient unable to tolerate cpap and wants to return it .  Please call to discuss process .

## 2018-08-11 NOTE — Telephone Encounter (Signed)
Called patient and offered referral for dental device. Pt states he does not want to try this. He prefers to try diet and exercise. He will be returning cpap to [facility]. Nothing further needed.

## 2018-08-21 ENCOUNTER — Other Ambulatory Visit: Payer: Self-pay | Admitting: Internal Medicine

## 2018-09-03 DIAGNOSIS — G4733 Obstructive sleep apnea (adult) (pediatric): Secondary | ICD-10-CM | POA: Diagnosis not present

## 2018-09-21 ENCOUNTER — Other Ambulatory Visit: Payer: Self-pay | Admitting: Internal Medicine

## 2018-10-03 DIAGNOSIS — G4733 Obstructive sleep apnea (adult) (pediatric): Secondary | ICD-10-CM | POA: Diagnosis not present

## 2018-10-13 ENCOUNTER — Other Ambulatory Visit: Payer: Self-pay | Admitting: Internal Medicine

## 2018-10-30 ENCOUNTER — Other Ambulatory Visit: Payer: Self-pay | Admitting: Internal Medicine

## 2018-11-06 ENCOUNTER — Other Ambulatory Visit: Payer: Self-pay | Admitting: Internal Medicine

## 2018-11-06 NOTE — Telephone Encounter (Signed)
Refilled: 05/15/2018 Last OV: 05/15/2018 Next OV: 11/15/2018

## 2018-11-15 ENCOUNTER — Ambulatory Visit (INDEPENDENT_AMBULATORY_CARE_PROVIDER_SITE_OTHER): Payer: 59

## 2018-11-15 ENCOUNTER — Encounter: Payer: Self-pay | Admitting: Internal Medicine

## 2018-11-15 ENCOUNTER — Ambulatory Visit: Payer: 59 | Admitting: Internal Medicine

## 2018-11-15 ENCOUNTER — Other Ambulatory Visit: Payer: Self-pay | Admitting: Internal Medicine

## 2018-11-15 VITALS — BP 128/84 | HR 69 | Temp 98.4°F | Resp 16 | Ht 70.0 in | Wt 264.4 lb

## 2018-11-15 DIAGNOSIS — Z6839 Body mass index (BMI) 39.0-39.9, adult: Secondary | ICD-10-CM

## 2018-11-15 DIAGNOSIS — Z125 Encounter for screening for malignant neoplasm of prostate: Secondary | ICD-10-CM | POA: Diagnosis not present

## 2018-11-15 DIAGNOSIS — E785 Hyperlipidemia, unspecified: Secondary | ICD-10-CM | POA: Diagnosis not present

## 2018-11-15 DIAGNOSIS — R05 Cough: Secondary | ICD-10-CM

## 2018-11-15 DIAGNOSIS — J40 Bronchitis, not specified as acute or chronic: Secondary | ICD-10-CM

## 2018-11-15 DIAGNOSIS — E6609 Other obesity due to excess calories: Secondary | ICD-10-CM | POA: Diagnosis not present

## 2018-11-15 DIAGNOSIS — R0989 Other specified symptoms and signs involving the circulatory and respiratory systems: Secondary | ICD-10-CM | POA: Diagnosis not present

## 2018-11-15 DIAGNOSIS — G8929 Other chronic pain: Secondary | ICD-10-CM

## 2018-11-15 DIAGNOSIS — R058 Other specified cough: Secondary | ICD-10-CM

## 2018-11-15 DIAGNOSIS — I10 Essential (primary) hypertension: Secondary | ICD-10-CM

## 2018-11-15 DIAGNOSIS — M79605 Pain in left leg: Secondary | ICD-10-CM

## 2018-11-15 DIAGNOSIS — Z79899 Other long term (current) drug therapy: Secondary | ICD-10-CM | POA: Diagnosis not present

## 2018-11-15 LAB — CBC WITH DIFFERENTIAL/PLATELET
BASOS ABS: 0.1 10*3/uL (ref 0.0–0.1)
Basophils Relative: 0.5 % (ref 0.0–3.0)
Eosinophils Absolute: 0.1 10*3/uL (ref 0.0–0.7)
Eosinophils Relative: 0.9 % (ref 0.0–5.0)
HCT: 42.9 % (ref 39.0–52.0)
HEMOGLOBIN: 14.2 g/dL (ref 13.0–17.0)
Lymphocytes Relative: 21.2 % (ref 12.0–46.0)
Lymphs Abs: 2.6 10*3/uL (ref 0.7–4.0)
MCHC: 33.2 g/dL (ref 30.0–36.0)
MCV: 91.1 fl (ref 78.0–100.0)
Monocytes Absolute: 1.2 10*3/uL — ABNORMAL HIGH (ref 0.1–1.0)
Monocytes Relative: 9.5 % (ref 3.0–12.0)
NEUTROS ABS: 8.4 10*3/uL — AB (ref 1.4–7.7)
NEUTROS PCT: 67.9 % (ref 43.0–77.0)
Platelets: 342 10*3/uL (ref 150.0–400.0)
RBC: 4.71 Mil/uL (ref 4.22–5.81)
RDW: 14 % (ref 11.5–15.5)
WBC: 12.3 10*3/uL — AB (ref 4.0–10.5)

## 2018-11-15 LAB — COMPREHENSIVE METABOLIC PANEL
ALBUMIN: 4.3 g/dL (ref 3.5–5.2)
ALT: 20 U/L (ref 0–53)
AST: 20 U/L (ref 0–37)
Alkaline Phosphatase: 118 U/L — ABNORMAL HIGH (ref 39–117)
BILIRUBIN TOTAL: 0.6 mg/dL (ref 0.2–1.2)
BUN: 15 mg/dL (ref 6–23)
CALCIUM: 10.1 mg/dL (ref 8.4–10.5)
CHLORIDE: 94 meq/L — AB (ref 96–112)
CO2: 33 meq/L — AB (ref 19–32)
Creatinine, Ser: 0.91 mg/dL (ref 0.40–1.50)
GFR: 90.28 mL/min (ref >60.00)
Glucose, Bld: 116 mg/dL — ABNORMAL HIGH (ref 70–99)
POTASSIUM: 4.2 meq/L (ref 3.5–5.1)
Sodium: 136 mEq/L (ref 135–145)
Total Protein: 7.5 g/dL (ref 6.0–8.3)

## 2018-11-15 LAB — PSA: PSA: 0.26 ng/mL (ref 0.10–4.00)

## 2018-11-15 MED ORDER — PREDNISONE 10 MG PO TABS: ORAL_TABLET | ORAL | 0 refills | Status: DC

## 2018-11-15 MED ORDER — METHYLPREDNISOLONE ACETATE 80 MG/ML IJ SUSP
80.0000 mg | Freq: Once | INTRAMUSCULAR | Status: AC
  Administered 2018-11-15: 80 mg via INTRAMUSCULAR

## 2018-11-15 MED ORDER — LEVOFLOXACIN 500 MG PO TABS: 500.0000 mg | ORAL_TABLET | Freq: Every day | ORAL | 0 refills | Status: DC

## 2018-11-15 MED ORDER — HYDROCODONE-ACETAMINOPHEN 10-325 MG PO TABS: 1.0000 | ORAL_TABLET | Freq: Every day | ORAL | 0 refills | Status: DC | PRN

## 2018-11-15 MED ORDER — BENZONATATE 200 MG PO CAPS: 200.0000 mg | ORAL_CAPSULE | Freq: Three times a day (TID) | ORAL | 1 refills | Status: DC | PRN

## 2018-11-15 MED ORDER — HYDROCOD POLST-CPM POLST ER 10-8 MG/5ML PO SUER: 5.0000 mL | Freq: Every evening | ORAL | 0 refills | Status: DC | PRN

## 2018-11-15 MED ORDER — HYDROCODONE-ACETAMINOPHEN 10-325 MG PO TABS: 1.0000 | ORAL_TABLET | Freq: Four times a day (QID) | ORAL | 0 refills | Status: DC | PRN

## 2018-11-15 MED ORDER — METHYLPREDNISOLONE ACETATE 80 MG/ML IJ SUSP: 80.0000 mg | Freq: Once | INTRAMUSCULAR | Status: DC

## 2018-11-15 MED ORDER — ALBUTEROL SULFATE (2.5 MG/3ML) 0.083% IN NEBU
2.5000 mg | INHALATION_SOLUTION | Freq: Once | RESPIRATORY_TRACT | Status: AC
Start: 2018-11-15 — End: 2018-11-15
  Administered 2018-11-15: 2.5 mg via RESPIRATORY_TRACT

## 2018-11-15 MED ORDER — IPRATROPIUM-ALBUTEROL 0.5-2.5 (3) MG/3ML IN SOLN: 3.0000 mL | Freq: Once | RESPIRATORY_TRACT | Status: DC

## 2018-11-15 MED ORDER — IPRATROPIUM BROMIDE 0.02 % IN SOLN
0.5000 mg | Freq: Once | RESPIRATORY_TRACT | Status: AC
  Administered 2018-11-15: 0.5 mg via RESPIRATORY_TRACT

## 2018-11-15 NOTE — Progress Notes (Signed)
Subjective:  Patient ID: Tyrone Mcdowell, male    DOB: 1956-03-15  Age: 62 y.o. MRN: [id number]  CC: The primary encounter diagnosis was Cough productive of purulent sputum. Diagnoses of Prostate cancer screening, Long-term use of high-risk medication, Bronchitis, Chronic pain of left lower extremity, Hyperlipidemia LDL goal <100, Essential hypertension, Class 2 obesity due to excess calories without serious comorbidity with body mass index (BMI) of 39.0 to 39.9 in adult, and Bronchitis with tracheitis were also pertinent to this visit.  HPI Tyrone Mcdowell presents for follow up on multiple  Issues.  Acute illness:  Productive cough for the past 10 days,  Started as a viral infection ,  Went into chest,  Feels tight,  Occasional wheezing.  Feels run down.    chronic leg pain post traumatic OA> .  Left lateral tibia screaming at night.  Sing tramadol and tylenol for daytime but not sleeping well at night,  Requesting use of vicodin for nighttime pain.  Refill history confirmed via [place] Controlled Substance databas, accessed by me today.[name]  Losing weight intentionally, down  12 lbs since last visit.   Gave up bread and mayonaaise .  Engineer, drilling for exercise   Stress :  Emotionally supportive of 62 yr old son who is battling alcoholism  . Son had a short stay in rehab.  Now 3 weeks sober.     Hypertension: patient's wife Maudie Mercury  checks blood pressure twice weekly at home.  Readings have been for the most part < 140/80 at rest . Patient is following a reduce salt diet most days and is taking medications as prescribed  Lab Results  Component Value Date   HGBA1C 5.5 05/09/2018     Outpatient Medications Prior to Visit  Medication Sig Dispense Refill  . amLODipine (NORVASC) 10 MG tablet TAKE 1 TABLET BY MOUTH DAILY 90 tablet 1  . aspirin 81 MG tablet Take 1 tablet (81 mg total) by mouth daily. 90 tablet 0  . atorvastatin (LIPITOR) 20 MG tablet TAKE 1 TABLET (20 MG TOTAL) BY MOUTH DAILY AT  6 PM. 30 tablet 3  . clopidogrel (PLAVIX) 75 MG tablet Take 1 tablet (75 mg total) by mouth daily. 90 tablet 3  . DULoxetine (CYMBALTA) 30 MG capsule TAKE 1 CAPSULE (30 MG TOTAL) BY MOUTH DAILY. INCREASE TO TWICE DAILY AFTER 2 WEEKS 180 capsule 1  . furosemide (LASIX) 20 MG tablet Take 1 tablet (20 mg total) by mouth daily as needed. 90 tablet 2  . gabapentin (NEURONTIN) 100 MG capsule TAKE 2 CAPSULES BY MOUTH 3 TIMES DAILY. 180 capsule 2  . hydrochlorothiazide (HYDRODIURIL) 25 MG tablet TAKE 1 TABLET (25 MG TOTAL) BY MOUTH DAILY. 90 tablet 1  . tizanidine (ZANAFLEX) 6 MG capsule TAKE 1 CAPSULE (6MG ) BY MOUTH 3 TIMES DAILY AS NEEDED FOR MUSCLE SPASMS. 90 capsule 1  . traMADol (ULTRAM) 50 MG tablet TAKE 1 TABLET BY MOUTH EVERY 6 HOURS AS NEEDED FOR CHRONIC BACK AND LEG PAIN 120 tablet 5  . valsartan (DIOVAN) 160 MG tablet TAKE 1 TABLET BY MOUTH DAILY 90 tablet 1   No facility-administered medications prior to visit.     Review of Systems;  Patient denies headache, fevers, malaise, unintentional weight loss, skin rash, eye pain,sinus pain, sore throat, dysphagia,  hemoptysis ,  dyspnea, chest pain, palpitations, orthopnea, edema, abdominal pain, nausea, melena, diarrhea, constipation, flank pain, dysuria, hematuria, urinary  Frequency, nocturia, numbness, tingling, seizures,  Focal weakness, Loss of consciousness,  Tremor,  insomnia, depression, anxiety, and suicidal ideation.      Objective:  BP 128/84 (BP Location: Left Arm, Patient Position: Sitting, Cuff Size: Normal)   Pulse 69   Temp 98.4 F (36.9 C) (Oral)   Resp 16   Ht 5\' 10"  (1.778 m)   Wt 264 lb 6.4 oz (119.9 kg)   SpO2 98%   BMI 37.94 kg/m   BP Readings from Last 3 Encounters:  11/15/18 128/84  06/30/18 120/68  06/22/18 124/77    Wt Readings from Last 3 Encounters:  11/15/18 264 lb 6.4 oz (119.9 kg)  06/30/18 276 lb 12 oz (125.5 kg)  06/14/18 275 lb (124.7 kg)    General appearance: alert, cooperative and appears  stated age Ears: normal TM's and external ear canals both ears Throat: lips, mucosa, and tongue normal; teeth and gums normal Neck: no adenopathy, no carotid bruit, supple, symmetrical, trachea midline and thyroid not enlarged, symmetric, no tenderness/mass/nodules Back: symmetric, no curvature. ROM normal. No CVA tenderness. Lungs: bilateral ronchi with light wheezing,  no egophony  Heart: regular rate and rhythm, S1, S2 normal, no murmur, click, rub or gallop Abdomen: soft, non-tender; bowel sounds normal; no masses,  no organomegaly Pulses: 2+ and symmetric Skin: Skin color, texture, turgor normal. No rashes or lesions Ext: acquired surgical deformity(chronic)of left lower leg. No warmth or redness.  Lymph nodes: Cervical, supraclavicular, and axillary nodes normal.  Lab Results  Component Value Date   HGBA1C 5.5 05/09/2018   HGBA1C 5.9 11/07/2017    Lab Results  Component Value Date   CREATININE 0.91 11/15/2018   CREATININE 1.13 05/24/2018   CREATININE 1.00 05/08/2018    Lab Results  Component Value Date   WBC 12.3 (H) 11/15/2018   HGB 14.2 11/15/2018   HCT 42.9 11/15/2018   PLT 342.0 11/15/2018   GLUCOSE 116 (H) 11/15/2018   CHOL 170 05/09/2018   TRIG 111 05/09/2018   HDL 35 (L) 05/09/2018   LDLDIRECT 142.8 10/23/2012   LDLCALC 113 (H) 05/09/2018   ALT 20 11/15/2018   AST 20 11/15/2018   NA 136 11/15/2018   K 4.2 11/15/2018   CL 94 (L) 11/15/2018   CREATININE 0.91 11/15/2018   BUN 15 11/15/2018   CO2 33 (H) 11/15/2018   TSH 1.21 11/07/2017   PSA 0.26 11/15/2018   INR 1.07 05/08/2018   HGBA1C 5.5 05/09/2018   MICROALBUR <0.7 11/11/2015    Ct Coronary Morph W/cta Cor W/score W/ca W/cm &/or Wo/cm  Addendum Date: 06/22/2018   ADDENDUM REPORT: 06/22/2018 11:14 CLINICAL DATA:  Chest pain EXAM: Cardiac CTA MEDICATIONS: Sub lingual nitro. 4mg  and lopressor 5 mg TECHNIQUE: The patient was scanned on a Siemens 076 slice scanner. Gantry rotation speed was 270 msecs.  Collimation was .15mm. A 100 kV prospective scan was triggered in the descending thoracic aorta at 111 HU's with 5% padding centered around 78% of the R-R interval. Average HR during the scan was 48 bpm. The 3D data set was interpreted on a dedicated work station using MPR, MIP and VRT modes. A total of 80cc of contrast was used. FINDINGS: Non-cardiac: See separate report from [facility]. No significant findings on limited lung and soft tissue windows. Calcium Score: No calcium detected Coronary Arteries: Co- dominant with no anomalies LM: Normal high take off near non coronary / left coronary cusp LAD: Normal D1: Normal D2: Normal D3: Normal Circumflex: Co dominant Normal OM1: Normal OM2: Normal RCA: Normal PDA: Normal PLA: Normal IMPRESSION: 1. Calcium score 0 2.  Mild aortic root dilatation 4.0 cm 3.  Normal co dominant coronary arteries Jenkins Rouge Electronically Signed   By: Jenkins Rouge M.D.   On: 06/22/2018 11:14   Result Date: 06/22/2018 EXAM: OVER-READ INTERPRETATION  CT CHEST The following report is an over-read performed by radiologist Dr. Abigail Miyamoto of [facility], [place] on 06/22/2018. This over-read does not include interpretation of cardiac or coronary anatomy or pathology. The CTA interpretation by the cardiologist is attached. COMPARISON:  Chest radiograph 03/03/2015 FINDINGS: Vascular: Aortic atherosclerosis. No dissection. No central pulmonary embolism, on this non-dedicated study. Mediastinum/Nodes: No imaged thoracic adenopathy. Subtle fluid level in the esophagus on 09/11. Lungs/Pleura: No pleural fluid. 4 mm subpleural nodular density within the anterior right lung base on 19/12 is favored to represent an area of scarring. Upper Abdomen: Normal imaged portions of the liver, spleen, stomach. Musculoskeletal: Probable sebaceous cyst about the anterior right chest wall at 11 mm on 21/11. No acute osseous abnormality. IMPRESSION: 1.  No acute findings in the imaged extracardiac  chest. 2. Probable nodular scarring at the anterior right lung base. No follow-up needed if patient is low-risk. Non-contrast chest CT can be considered in 12 months if patient is high-risk. This recommendation follows the consensus statement: Guidelines for Management of Incidental Pulmonary Nodules Detected on CT Images: From the Fleischner Society 2017; Radiology 2017; 284:228-243. 3. Esophageal air fluid level suggests dysmotility or gastroesophageal reflux. Electronically Signed: By: Abigail Miyamoto M.D. On: 06/22/2018 11:04    Assessment & Plan:   Problem List Items Addressed This Visit    Bronchitis with tracheitis    levaquin and prednisone taper prescribed. Daily use of a probiotic advised for 3 weeks. .  Chest x ray was negative for infiltrate.      Chronic leg pain    Secondary to traumatic injury, remote.  Continue tramadol for post traumatic OA and add vicodin at night only.  Refill history confirmed via [place] Controlled Substance databas, accessed by me today..      Relevant Medications   predniSONE (DELTASONE) 10 MG tablet   HYDROcodone-acetaminophen (NORCO) 10-325 MG tablet   methylPREDNISolone acetate (DEPO-MEDROL) injection 80 mg (Completed)   Hyperlipidemia LDL goal <100    Based on his untreated lipid profile, the risk of clinically significant CAD is 16% over the next 10 years, using the Framingham risk calculator.  He has historically  deferred treatment. But since his hospitalization for TIA  He is tolerating medication. LFTs are normal.  Coronary calcium  Score is zero per July scan/   Lab Results  Component Value Date   ALT 20 11/15/2018   AST 20 11/15/2018   ALKPHOS 118 (H) 11/15/2018   BILITOT 0.6 11/15/2018         Hypertension    Well controlled on current regimen. Renal function stable, no changes today.  Lab Results  Component Value Date   CREATININE 0.91 11/15/2018   Lab Results  Component Value Date   NA 136 11/15/2018   K 4.2 11/15/2018   CL 94  (L) 11/15/2018   CO2 33 (H) 11/15/2018         Obesity    I have congratulated him in reduction of   BMI and encouraged  Continued weight loss with goal of 10% of body weight over the next 6 months using a low glycemic index diet and regular exercise a minimum of 5 days per week.         Other Visit Diagnoses    Cough  productive of purulent sputum    -  Primary   Relevant Orders   DG Chest 2 View (Completed)   CBC with Differential/Platelet (Completed)   Prostate cancer screening       Relevant Orders   PSA (Completed)   Long-term use of high-risk medication       Relevant Orders   Comprehensive metabolic panel (Completed)   Bronchitis       Relevant Medications   methylPREDNISolone acetate (DEPO-MEDROL) injection 80 mg (Completed)   albuterol (PROVENTIL) (2.5 MG/3ML) 0.083% nebulizer solution 2.5 mg (Completed)   ipratropium (ATROVENT) nebulizer solution 0.5 mg (Completed)     A total of 40 minutes was spent with patient more than half of which was spent in counseling patient on the above mentioned issues , reviewing and explaining recent labs and imaging studies done, and coordination of care.   I have changed Lavon Paganini. Cyr's HYDROcodone-acetaminophen. I am also having him start on levofloxacin, predniSONE, and benzonatate. Additionally, I am having him maintain his clopidogrel, aspirin, furosemide, DULoxetine, valsartan, amLODipine, tizanidine, gabapentin, atorvastatin, hydrochlorothiazide, traMADol, and chlorpheniramine-HYDROcodone. We administered methylPREDNISolone acetate, albuterol, and ipratropium.  Meds ordered this encounter  Medications  . DISCONTD: chlorpheniramine-HYDROcodone (TUSSIONEX PENNKINETIC ER) 10-8 MG/5ML SUER    Sig: Take 5 mLs by mouth at bedtime as needed for cough.    Dispense:  140 mL    Refill:  0  . levofloxacin (LEVAQUIN) 500 MG tablet    Sig: Take 1 tablet (500 mg total) by mouth daily.    Dispense:  7 tablet    Refill:  0  . predniSONE  (DELTASONE) 10 MG tablet    Sig: 6 tablets on DayS 1 2, AND 3, then reduce by 1 tablet daily until gone    Dispense:  33 tablet    Refill:  0  . chlorpheniramine-HYDROcodone (TUSSIONEX PENNKINETIC ER) 10-8 MG/5ML SUER    Sig: Take 5 mLs by mouth at bedtime as needed for cough.    Dispense:  180 mL    Refill:  0  . DISCONTD: HYDROcodone-acetaminophen (NORCO) 10-325 MG tablet    Sig: Take 1 tablet by mouth every 6 (six) hours as needed.    Dispense:  30 tablet    Refill:  0  . HYDROcodone-acetaminophen (NORCO) 10-325 MG tablet    Sig: Take 1 tablet by mouth daily as needed.    Dispense:  30 tablet    Refill:  0  . benzonatate (TESSALON) 200 MG capsule    Sig: Take 1 capsule (200 mg total) by mouth 3 (three) times daily as needed for cough.    Dispense:  60 capsule    Refill:  1  . DISCONTD: ipratropium-albuterol (DUONEB) 0.5-2.5 (3) MG/3ML nebulizer solution 3 mL  . DISCONTD: methylPREDNISolone acetate (DEPO-MEDROL) injection 80 mg  . methylPREDNISolone acetate (DEPO-MEDROL) injection 80 mg  . albuterol (PROVENTIL) (2.5 MG/3ML) 0.083% nebulizer solution 2.5 mg  . ipratropium (ATROVENT) nebulizer solution 0.5 mg    Medications Discontinued During This Encounter  Medication Reason  . chlorpheniramine-HYDROcodone (TUSSIONEX PENNKINETIC ER) 10-8 MG/5ML SUER Reorder  . HYDROcodone-acetaminophen (NORCO) 10-325 MG tablet   . methylPREDNISolone acetate (DEPO-MEDROL) injection 80 mg   . ipratropium-albuterol (DUONEB) 0.5-2.5 (3) MG/3ML nebulizer solution 3 mL     Follow-up: Return in about 3 months (around 02/14/2019).   [name], MD

## 2018-11-15 NOTE — Telephone Encounter (Signed)
Copied from [place] [id number]. Topic: Quick Communication - Rx Refill/Question >> Nov 15, 2018 12:48 PM Burchel, Abbi R wrote: Medication: valsartan (DIOVAN) 160 MG tablet   Preferred Pharmacy: [facility], [address] [place] [address] Phone: [phone] Fax: [phone]    Pt was advised that RX refills may take up to 3 business days. We ask that you follow-up with your pharmacy.

## 2018-11-15 NOTE — Patient Instructions (Addendum)
You are doing VERY WELL with the weight loss!!    You have a viral infection complicated by bronchitis.   prednisone (60 mg daily x  Days,  Then taper by 10 mg daily until gone) for the wheezing and inflammation  LEVAQUIN FOR THE INFECTION    I am prescribing Tussionex,  Which is a strong cough suppressant that has Vicodin in it so take it only at night  I am also prescribing a cough suppressant for daytime [place] that is in  pill form called benzonotate to manage your daytime cough   Chest x ray today   I am prescribing Vicodin to use at night for your leg pain IF NEEDED.  I can refill this 2 more times before you will need to be seen

## 2018-11-16 ENCOUNTER — Encounter: Payer: Self-pay | Admitting: Internal Medicine

## 2018-11-16 DIAGNOSIS — J40 Bronchitis, not specified as acute or chronic: Secondary | ICD-10-CM | POA: Insufficient documentation

## 2018-11-16 NOTE — Assessment & Plan Note (Signed)
Well controlled on current regimen. Renal function stable, no changes today.  Lab Results  Component Value Date   CREATININE 0.91 11/15/2018   Lab Results  Component Value Date   NA 136 11/15/2018   K 4.2 11/15/2018   CL 94 (L) 11/15/2018   CO2 33 (H) 11/15/2018

## 2018-11-16 NOTE — Assessment & Plan Note (Addendum)
I have congratulated him in reduction of   BMI and encouraged  Continued weight loss with goal of 10% of body weight over the next 6 months using a low glycemic index diet and regular exercise a minimum of 5 days per week.

## 2018-11-16 NOTE — Assessment & Plan Note (Signed)
Secondary to traumatic injury, remote.  Continue tramadol for post traumatic OA and add vicodin at night only.  Refill history confirmed via [place] Controlled Substance databas, accessed by me today.Tyrone Mcdowell

## 2018-11-16 NOTE — Assessment & Plan Note (Signed)
Based on his untreated lipid profile, the risk of clinically significant CAD is 16% over the next 10 years, using the Framingham risk calculator.  He has historically  deferred treatment. But since his hospitalization for TIA  He is tolerating medication. LFTs are normal.  Coronary calcium  Score is zero per July scan/   Lab Results  Component Value Date   ALT 20 11/15/2018   AST 20 11/15/2018   ALKPHOS 118 (H) 11/15/2018   BILITOT 0.6 11/15/2018

## 2018-11-16 NOTE — Telephone Encounter (Signed)
Need to verify with pharmacy that prescription was received that was written on 07/24/18 #90 with 1 refill. Pt should not be due for a refill until 01/2019.

## 2018-11-16 NOTE — Assessment & Plan Note (Signed)
levaquin and prednisone taper prescribed. Daily use of a probiotic advised for 3 weeks. .  Chest x ray was negative for infiltrate.

## 2018-11-17 NOTE — Telephone Encounter (Signed)
Spoke with Pam at [facility] who states that the pt picked up refill of medication on yesterday

## 2018-12-13 ENCOUNTER — Other Ambulatory Visit: Payer: Self-pay

## 2018-12-14 ENCOUNTER — Other Ambulatory Visit: Payer: Self-pay | Admitting: Internal Medicine

## 2018-12-14 MED ORDER — HYDROCODONE-ACETAMINOPHEN 10-325 MG PO TABS: 1.0000 | ORAL_TABLET | Freq: Every day | ORAL | 0 refills | Status: DC | PRN

## 2018-12-14 NOTE — Telephone Encounter (Signed)
Refilled: 11/15/2018 Last OV: 11/15/2018 Next OV: 02/16/2019

## 2018-12-20 ENCOUNTER — Ambulatory Visit: Payer: 59 | Admitting: Adult Health

## 2019-01-01 ENCOUNTER — Other Ambulatory Visit: Payer: Self-pay | Admitting: Internal Medicine

## 2019-01-11 ENCOUNTER — Other Ambulatory Visit: Payer: Self-pay

## 2019-01-11 ENCOUNTER — Other Ambulatory Visit: Payer: Self-pay | Admitting: Internal Medicine

## 2019-01-11 MED ORDER — HYDROCODONE-ACETAMINOPHEN 10-325 MG PO TABS: 1.0000 | ORAL_TABLET | Freq: Every day | ORAL | 0 refills | Status: DC | PRN

## 2019-01-11 NOTE — Telephone Encounter (Signed)
Refilled: 12/14/2018 Last OV: 11/15/2018 Next OV: 02/16/2019

## 2019-01-29 NOTE — Telephone Encounter (Signed)
Spoke with pt and he has been scheduled for 02/05/2019. Pt stated that the pain under his shoulder blade has been going on for about 3 weeks and doesn't seem to be getting any better. 02/05/2019 is the first available appt, does the pt need to be scheduled any sooner.

## 2019-01-30 DIAGNOSIS — M7542 Impingement syndrome of left shoulder: Secondary | ICD-10-CM | POA: Diagnosis not present

## 2019-01-31 ENCOUNTER — Other Ambulatory Visit: Payer: Self-pay | Admitting: Internal Medicine

## 2019-01-31 DIAGNOSIS — M25512 Pain in left shoulder: Secondary | ICD-10-CM

## 2019-01-31 NOTE — Progress Notes (Signed)
amb ref to  

## 2019-02-05 ENCOUNTER — Encounter: Payer: Self-pay | Admitting: Internal Medicine

## 2019-02-05 ENCOUNTER — Ambulatory Visit (INDEPENDENT_AMBULATORY_CARE_PROVIDER_SITE_OTHER): Payer: 59 | Admitting: Internal Medicine

## 2019-02-05 VITALS — BP 150/98 | HR 72 | Temp 98.6°F | Resp 16 | Ht 70.0 in | Wt 267.8 lb

## 2019-02-05 DIAGNOSIS — Z0000 Encounter for general adult medical examination without abnormal findings: Secondary | ICD-10-CM

## 2019-02-05 DIAGNOSIS — I10 Essential (primary) hypertension: Secondary | ICD-10-CM

## 2019-02-05 DIAGNOSIS — E785 Hyperlipidemia, unspecified: Secondary | ICD-10-CM | POA: Diagnosis not present

## 2019-02-05 DIAGNOSIS — R748 Abnormal levels of other serum enzymes: Secondary | ICD-10-CM | POA: Diagnosis not present

## 2019-02-05 DIAGNOSIS — G8929 Other chronic pain: Secondary | ICD-10-CM

## 2019-02-05 DIAGNOSIS — D72823 Leukemoid reaction: Secondary | ICD-10-CM

## 2019-02-05 DIAGNOSIS — I6503 Occlusion and stenosis of bilateral vertebral arteries: Secondary | ICD-10-CM | POA: Diagnosis not present

## 2019-02-05 DIAGNOSIS — K760 Fatty (change of) liver, not elsewhere classified: Secondary | ICD-10-CM | POA: Diagnosis not present

## 2019-02-05 DIAGNOSIS — M79605 Pain in left leg: Secondary | ICD-10-CM

## 2019-02-05 DIAGNOSIS — Z8673 Personal history of transient ischemic attack (TIA), and cerebral infarction without residual deficits: Secondary | ICD-10-CM

## 2019-02-05 DIAGNOSIS — Z23 Encounter for immunization: Secondary | ICD-10-CM | POA: Diagnosis not present

## 2019-02-05 DIAGNOSIS — M79604 Pain in right leg: Secondary | ICD-10-CM

## 2019-02-05 LAB — CBC WITH DIFFERENTIAL/PLATELET
Basophils Absolute: 0 10*3/uL (ref 0.0–0.1)
Basophils Relative: 0.5 % (ref 0.0–3.0)
Eosinophils Absolute: 0.1 10*3/uL (ref 0.0–0.7)
Eosinophils Relative: 1.2 % (ref 0.0–5.0)
HCT: 43.1 % (ref 39.0–52.0)
Hemoglobin: 14.6 g/dL (ref 13.0–17.0)
LYMPHS PCT: 27.6 % (ref 12.0–46.0)
Lymphs Abs: 2.1 10*3/uL (ref 0.7–4.0)
MCHC: 33.9 g/dL (ref 30.0–36.0)
MCV: 91.1 fl (ref 78.0–100.0)
Monocytes Absolute: 0.7 10*3/uL (ref 0.1–1.0)
Monocytes Relative: 9.4 % (ref 3.0–12.0)
Neutro Abs: 4.6 10*3/uL (ref 1.4–7.7)
Neutrophils Relative %: 61.3 % (ref 43.0–77.0)
Platelets: 362 10*3/uL (ref 150.0–400.0)
RBC: 4.73 Mil/uL (ref 4.22–5.81)
RDW: 13.8 % (ref 11.5–15.5)
WBC: 7.5 10*3/uL (ref 4.0–10.5)

## 2019-02-05 LAB — COMPREHENSIVE METABOLIC PANEL
ALK PHOS: 125 U/L — AB (ref 39–117)
ALT: 16 U/L (ref 0–53)
AST: 12 U/L (ref 0–37)
Albumin: 4.4 g/dL (ref 3.5–5.2)
BILIRUBIN TOTAL: 0.6 mg/dL (ref 0.2–1.2)
BUN: 14 mg/dL (ref 6–23)
CO2: 33 mEq/L — ABNORMAL HIGH (ref 19–32)
Calcium: 10.2 mg/dL (ref 8.4–10.5)
Chloride: 98 mEq/L (ref 96–112)
Creatinine, Ser: 0.86 mg/dL (ref 0.40–1.50)
GFR: 90.6 mL/min (ref >60.00)
Glucose, Bld: 94 mg/dL (ref 70–99)
Potassium: 4.1 mEq/L (ref 3.5–5.1)
Sodium: 137 mEq/L (ref 135–145)
TOTAL PROTEIN: 7.7 g/dL (ref 6.0–8.3)

## 2019-02-05 MED ORDER — HYDROCODONE-ACETAMINOPHEN 10-325 MG PO TABS: 1.0000 | ORAL_TABLET | Freq: Two times a day (BID) | ORAL | 0 refills | Status: DC | PRN

## 2019-02-05 MED ORDER — CELECOXIB 100 MG PO CAPS: 100.0000 mg | ORAL_CAPSULE | Freq: Two times a day (BID) | ORAL | 5 refills | Status: DC

## 2019-02-05 NOTE — Patient Instructions (Addendum)
Change  Your dosing of valsartan to bedtime dosing and continue  Taking amlodipine in the morning   Have Kim check pressure in one week and send me a message if  it is 140/90  Or higher   adding 2nd daily dose of Vicodin  Daily     adding celebrex twice daily for anti inflammatory  We can increase  gabapentin to 300 mg three times daily if needed   RTC 3 months   Repeat labs today

## 2019-02-05 NOTE — Progress Notes (Signed)
Subjective:  Patient ID: Tyrone Mcdowell, male    DOB: 07-Oct-1956  Age: 63 y.o. MRN: [id number]  CC: The primary encounter diagnosis was Routine general medical examination at a health care facility. Diagnoses of Need for immunization against influenza, Elevated alkaline phosphatase measurement, Leukemoid reaction, Stenosis of both vertebral arteries, History of TIA (transient ischemic attack), Hyperlipidemia LDL goal <100, Essential hypertension, Fatty liver, and Chronic pain of both lower extremities were also pertinent to this visit.  HPI by Stephenie Acres presents for management of chronic pain .  Patient was treated last week for left shoulder pain by [facility]   With a subacromial steroid injection.  Plain films were done and reportedly positive for bone spurring .[name]  He states that the  shoulder feels much better   Patient has chronic  leg pain that is multifactorial .  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  He has a history of low back pain secondary to spinal stenosis and underwent lumbar decompression in May 2017.  He was weaned off of narcotics post operatively but continues to have pain not well controlled with tramadol 4 daily (2 every 12 hours ) and  one vicodin daily  . He is in constant pain ,   Also taking cymbalta. Not taking any NSAIDS. Pain is most severe in the early morning and late at night that wakes him from sleep. Bilateral tibia pain.  Last dose of vicodin was around 4 days ago prior to seeing [facility]  .  No withdrawal symptoms   HTN:  Patient is taking his medications as prescribed and notes no adverse effects.  Home BP readings have not been done recently .  His wife is an Therapist, sports  he is avoiding added salt in his  diet and walking regularly about 3 times per week for exercise  .  Outpatient Medications Prior to Visit  Medication Sig Dispense Refill  . amLODipine (NORVASC) 10 MG tablet TAKE 1 TABLET BY MOUTH DAILY 90 tablet 1    . aspirin 81 MG tablet Take 1 tablet (81 mg total) by mouth daily. 90 tablet 0  . atorvastatin (LIPITOR) 20 MG tablet TAKE 1 TABLET (20 MG TOTAL) BY MOUTH DAILY AT 6 PM. 30 tablet 3  . clopidogrel (PLAVIX) 75 MG tablet Take 1 tablet (75 mg total) by mouth daily. 90 tablet 3  . DULoxetine (CYMBALTA) 30 MG capsule TAKE 1 CAPSULE BY MOUTH TWICE DAILY 180 capsule 1  . furosemide (LASIX) 20 MG tablet Take 1 tablet (20 mg total) by mouth daily as needed. 90 tablet 2  . gabapentin (NEURONTIN) 100 MG capsule TAKE 2 CAPSULES BY MOUTH 3 TIMES DAILY. 180 capsule 2  . hydrochlorothiazide (HYDRODIURIL) 25 MG tablet TAKE 1 TABLET (25 MG TOTAL) BY MOUTH DAILY. 90 tablet 1  . tizanidine (ZANAFLEX) 6 MG capsule TAKE 1 CAPSULE (6MG ) BY MOUTH 3 TIMES DAILY AS NEEDED FOR MUSCLE SPASMS. 90 capsule 1  . traMADol (ULTRAM) 50 MG tablet TAKE 1 TABLET BY MOUTH EVERY 6 HOURS AS NEEDED FOR CHRONIC BACK AND LEG PAIN 120 tablet 5  . valsartan (DIOVAN) 160 MG tablet TAKE 1 TABLET BY MOUTH DAILY 90 tablet 1  . HYDROcodone-acetaminophen (NORCO) 10-325 MG tablet Take 1 tablet by mouth daily as needed. 30 tablet 0  . benzonatate (TESSALON) 200 MG capsule Take 1 capsule (200 mg total) by mouth 3 (three) times daily as needed for cough. (Patient not taking: Reported on 02/05/2019) 60  capsule 1  . chlorpheniramine-HYDROcodone (TUSSIONEX PENNKINETIC ER) 10-8 MG/5ML SUER Take 5 mLs by mouth at bedtime as needed for cough. (Patient not taking: Reported on 02/05/2019) 180 mL 0  . levofloxacin (LEVAQUIN) 500 MG tablet Take 1 tablet (500 mg total) by mouth daily. (Patient not taking: Reported on 02/05/2019) 7 tablet 0  . predniSONE (DELTASONE) 10 MG tablet 6 tablets on DayS 1 2, AND 3, then reduce by 1 tablet daily until gone (Patient not taking: Reported on 02/05/2019) 33 tablet 0   No facility-administered medications prior to visit.     Review of Systems;  Patient denies headache, fevers, malaise, unintentional weight loss, skin rash, eye  pain, sinus congestion and sinus pain, sore throat, dysphagia,  hemoptysis , cough, dyspnea, wheezing, chest pain, palpitations, orthopnea, edema, abdominal pain, nausea, melena, diarrhea, constipation, flank pain, dysuria, hematuria, urinary  Frequency, nocturia, numbness, tingling, seizures,  Focal weakness, Loss of consciousness,  Tremor, insomnia, depression, anxiety, and suicidal ideation.      Objective:  BP (!) 150/98 (BP Location: Left Arm, Patient Position: Sitting, Cuff Size: Large)   Pulse 72   Temp 98.6 F (37 C) (Oral)   Resp 16   Ht 5\' 10"  (1.778 m)   Wt 267 lb 12.8 oz (121.5 kg)   SpO2 98%   BMI 38.43 kg/m   BP Readings from Last 3 Encounters:  02/05/19 (!) 150/98  11/15/18 128/84  06/30/18 120/68    Wt Readings from Last 3 Encounters:  02/05/19 267 lb 12.8 oz (121.5 kg)  11/15/18 264 lb 6.4 oz (119.9 kg)  06/30/18 276 lb 12 oz (125.5 kg)    General appearance: alert, cooperative and appears stated age Ears: normal TM's and external ear canals both ears Throat: lips, mucosa, and tongue normal; teeth and gums normal Neck: no adenopathy, no carotid bruit, supple, symmetrical, trachea midline and thyroid not enlarged, symmetric, no tenderness/mass/nodules Back: symmetric, no curvature. ROM normal. No CVA tenderness. Lungs: clear to auscultation bilaterally Heart: regular rate and rhythm, S1, S2 normal, no murmur, click, rub or gallop Abdomen: soft, non-tender; bowel sounds normal; no masses,  no organomegaly Pulses: 2+ and symmetric Skin: Skin color, texture, turgor normal. No rashes or lesions Lymph nodes: Cervical, supraclavicular, and axillary nodes normal.  Lab Results  Component Value Date   HGBA1C 5.5 05/09/2018   HGBA1C 5.9 11/07/2017    Lab Results  Component Value Date   CREATININE 0.86 02/05/2019   CREATININE 0.91 11/15/2018   CREATININE 1.13 05/24/2018    Lab Results  Component Value Date   WBC 7.5 02/05/2019   HGB 14.6 02/05/2019   HCT  43.1 02/05/2019   PLT 362.0 02/05/2019   GLUCOSE 94 02/05/2019   CHOL 170 05/09/2018   TRIG 111 05/09/2018   HDL 35 (L) 05/09/2018   LDLDIRECT 142.8 10/23/2012   LDLCALC 113 (H) 05/09/2018   ALT 16 02/05/2019   AST 12 02/05/2019   NA 137 02/05/2019   K 4.1 02/05/2019   CL 98 02/05/2019   CREATININE 0.86 02/05/2019   BUN 14 02/05/2019   CO2 33 (H) 02/05/2019   TSH 1.21 11/07/2017   PSA 0.26 11/15/2018   INR 1.07 05/08/2018   HGBA1C 5.5 05/09/2018   MICROALBUR <0.7 11/11/2015    Ct Coronary Morph W/cta Cor W/score W/ca W/cm &/or Wo/cm  Addendum Date: 06/22/2018   ADDENDUM REPORT: 06/22/2018 11:14 CLINICAL DATA:  Chest pain EXAM: Cardiac CTA MEDICATIONS: Sub lingual nitro. 4mg  and lopressor 5 mg TECHNIQUE: The patient was  scanned on a Siemens 315 slice scanner. Gantry rotation speed was 270 msecs. Collimation was .28mm. A 100 kV prospective scan was triggered in the descending thoracic aorta at 111 HU's with 5% padding centered around 78% of the R-R interval. Average HR during the scan was 48 bpm. The 3D data set was interpreted on a dedicated work station using MPR, MIP and VRT modes. A total of 80cc of contrast was used. FINDINGS: Non-cardiac: See separate report from [facility]. No significant findings on limited lung and soft tissue windows. Calcium Score: No calcium detected Coronary Arteries: Co- dominant with no anomalies LM: Normal high take off near non coronary / left coronary cusp LAD: Normal D1: Normal D2: Normal D3: Normal Circumflex: Co dominant Normal OM1: Normal OM2: Normal RCA: Normal PDA: Normal PLA: Normal IMPRESSION: 1. Calcium score 0 2.  Mild aortic root dilatation 4.0 cm 3.  Normal co dominant coronary arteries [place] Electronically Signed   By: [place] Rouge M.D.   On: 06/22/2018 11:14   Result Date: 06/22/2018 EXAM: OVER-READ INTERPRETATION  CT CHEST The following report is an over-read performed by radiologist Dr. Abigail Miyamoto of [facility],  [place] on 06/22/2018. This over-read does not include interpretation of cardiac or coronary anatomy or pathology. The CTA interpretation by the cardiologist is attached. COMPARISON:  Chest radiograph 03/03/2015 FINDINGS: Vascular: Aortic atherosclerosis. No dissection. No central pulmonary embolism, on this non-dedicated study. Mediastinum/Nodes: No imaged thoracic adenopathy. Subtle fluid level in the esophagus on 09/11. Lungs/Pleura: No pleural fluid. 4 mm subpleural nodular density within the anterior right lung base on 19/12 is favored to represent an area of scarring. Upper Abdomen: Normal imaged portions of the liver, spleen, stomach. Musculoskeletal: Probable sebaceous cyst about the anterior right chest wall at 11 mm on 21/11. No acute osseous abnormality. IMPRESSION: 1.  No acute findings in the imaged extracardiac chest. 2. Probable nodular scarring at the anterior right lung base. No follow-up needed if patient is low-risk. Non-contrast chest CT can be considered in 12 months if patient is high-risk. This recommendation follows the consensus statement: Guidelines for Management of Incidental Pulmonary Nodules Detected on CT Images: From the Fleischner Society 2017; Radiology 2017; 284:228-243. 3. Esophageal air fluid level suggests dysmotility or gastroesophageal reflux. Electronically Signed: By: Abigail Miyamoto M.D. On: 06/22/2018 11:04    Assessment & Plan:   Problem List Items Addressed This Visit    Stenosis of both vertebral arteries    Non occlusive,  Done during workup for TIA.  Continue Plavix, ASA and statin       Routine general medical examination at a health care facility - Primary   Hypertension    e reports compliance with medication regimen  but has an elevated reading today in office.  he is not using NSAIDs daily but celebrex will be started to help manage his  chronic pain .  Discussed goal of 120/70  (130/80 for patients over 70)  to preserve renal function.  he has been asked to  check her  BP  at home and  submit readings for evaluation. Renal function, electrolytes and screen for proteinuria are all normal   Lab Results  Component Value Date   CREATININE 0.86 02/05/2019   Lab Results  Component Value Date   NA 137 02/05/2019   K 4.1 02/05/2019   CL 98 02/05/2019   CO2 33 (H) 02/05/2019         Hyperlipidemia LDL goal <100    Based on his  untreated lipid profile, the risk of clinically significant CAD is 16% over the next 10 years, using the Framingham risk calculator.  He has historically  deferred treatment. But since his hospitalization for TIA  He is tolerating medication. LFTs are normal.  Coronary calcium  Score is zero per July scan.   Lab Results  Component Value Date   ALT 16 02/05/2019   AST 12 02/05/2019   ALKPHOS 125 (H) 02/05/2019   BILITOT 0.6 02/05/2019   Lab Results  Component Value Date   CHOL 170 05/09/2018   HDL 35 (L) 05/09/2018   LDLCALC 113 (H) 05/09/2018   LDLDIRECT 142.8 10/23/2012   TRIG 111 05/09/2018   CHOLHDL 4.9 05/09/2018         History of TIA (transient ischemic attack)    Presenting with vertigo.  Workup July 2019 .  Continue BP control,  Plavix, asa and statin       Fatty liver    Presumed by ultrasound changes and serologies negative for autoimmune causes of hepatitis.  Current transaminases are normal and all modifiable risk factors including obesity  and hyperlipidemia have been addressed .  Lab Results  Component Value Date   ALT 16 02/05/2019   AST 12 02/05/2019   ALKPHOS 125 (H) 02/05/2019   BILITOT 0.6 02/05/2019         Chronic leg pain    Secondary to traumatic injury, remote, and lumbar spondylosis with history of decompression in 2018 .  Pain has become more problematic in the last few months despite use of tramadol,  cymbalata and once daily vicodin .Continue tramadol for post traumatic OA and authorizing an increase use of vicodin  To twice daily . adding celebrex 100 mg bid.   Refill  history confirmed via [place] Controlled Substance databas, accessed by me today..      Relevant Medications   celecoxib (CELEBREX) 100 MG capsule   HYDROcodone-acetaminophen (NORCO) 10-325 MG tablet   HYDROcodone-acetaminophen (NORCO) 10-325 MG tablet (Start on 03/07/2019)   HYDROcodone-acetaminophen ([place]) 10-325 MG tablet    Other Visit Diagnoses    Need for immunization against influenza       Relevant Orders   Flu Vaccine QUAD 36+ mos IM (Completed)   Elevated alkaline phosphatase measurement       Relevant Orders   Comprehensive metabolic panel (Completed)   Leukemoid reaction       Relevant Orders   CBC with Differential/Platelet (Completed)      I have discontinued Lavon Paganini. Klann's levofloxacin, predniSONE, and chlorpheniramine-HYDROcodone. I have also changed his HYDROcodone-acetaminophen, HYDROcodone-acetaminophen, and HYDROcodone-acetaminophen. Additionally, I am having him start on celecoxib. Lastly, I am having him maintain his clopidogrel, aspirin, furosemide, valsartan, tizanidine, atorvastatin, hydrochlorothiazide, traMADol, benzonatate, gabapentin, DULoxetine, and amLODipine.  Meds ordered this encounter  Medications  . celecoxib (CELEBREX) 100 MG capsule    Sig: Take 1 capsule (100 mg total) by mouth 2 (two) times daily.    Dispense:  60 capsule    Refill:  5  . HYDROcodone-acetaminophen (NORCO) 10-325 MG tablet    Sig: Take 1 tablet by mouth every 12 (twelve) hours as needed.    Dispense:  60 tablet    Refill:  0  . HYDROcodone-acetaminophen (NORCO) 10-325 MG tablet    Sig: Take 1 tablet by mouth every 12 (twelve) hours as needed.    Dispense:  60 tablet    Refill:  0  . HYDROcodone-acetaminophen (NORCO) 10-325 MG tablet    Sig: Take 1 tablet by  mouth every 12 (twelve) hours as needed.    Dispense:  60 tablet    Refill:  0    Medications Discontinued During This Encounter  Medication Reason  . chlorpheniramine-HYDROcodone (TUSSIONEX PENNKINETIC ER) 10-8  MG/5ML SUER Completed Course  . levofloxacin (LEVAQUIN) 500 MG tablet Completed Course  . predniSONE (DELTASONE) 10 MG tablet Completed Course  . HYDROcodone-acetaminophen (NORCO) 10-325 MG tablet Reorder    Follow-up: No follow-ups on file.   [name], MD

## 2019-02-06 ENCOUNTER — Telehealth: Payer: Self-pay | Admitting: Internal Medicine

## 2019-02-06 NOTE — Assessment & Plan Note (Signed)
Presumed by ultrasound changes and serologies negative for autoimmune causes of hepatitis.  Current transaminases are normal and all modifiable risk factors including obesity  and hyperlipidemia have been addressed .  Lab Results  Component Value Date   ALT 16 02/05/2019   AST 12 02/05/2019   ALKPHOS 125 (H) 02/05/2019   BILITOT 0.6 02/05/2019

## 2019-02-06 NOTE — Assessment & Plan Note (Signed)
e reports compliance with medication regimen  but has an elevated reading today in office.  he is not using NSAIDs daily but celebrex will be started to help manage his  chronic pain .  Discussed goal of 120/70  (130/80 for patients over 70)  to preserve renal function.  he has been asked to check her  BP  at home and  submit readings for evaluation. Renal function, electrolytes and screen for proteinuria are all normal   Lab Results  Component Value Date   CREATININE 0.86 02/05/2019   Lab Results  Component Value Date   NA 137 02/05/2019   K 4.1 02/05/2019   CL 98 02/05/2019   CO2 33 (H) 02/05/2019

## 2019-02-06 NOTE — Assessment & Plan Note (Addendum)
Secondary to traumatic injury, remote, and lumbar spondylosis with history of decompression in 2018 .  Pain has become more problematic in the last few months despite use of tramadol,  cymbalata and once daily vicodin .Continue tramadol for post traumatic OA and authorizing an increase use of vicodin  To twice daily . adding celebrex 100 mg bid.   Refill history confirmed via [place] Controlled Substance databas, accessed by me today.[name]

## 2019-02-06 NOTE — Assessment & Plan Note (Signed)
Non occlusive,  Done during workup for TIA.  Continue Plavix, ASA and statin

## 2019-02-06 NOTE — Assessment & Plan Note (Signed)
Presenting with vertigo.  Workup July 2019 .  Continue BP control,  Plavix, asa and statin

## 2019-02-06 NOTE — Assessment & Plan Note (Signed)
Based on his untreated lipid profile, the risk of clinically significant CAD is 16% over the next 10 years, using the Framingham risk calculator.  He has historically  deferred treatment. But since his hospitalization for TIA  He is tolerating medication. LFTs are normal.  Coronary calcium  Score is zero per July scan.   Lab Results  Component Value Date   ALT 16 02/05/2019   AST 12 02/05/2019   ALKPHOS 125 (H) 02/05/2019   BILITOT 0.6 02/05/2019   Lab Results  Component Value Date   CHOL 170 05/09/2018   HDL 35 (L) 05/09/2018   LDLCALC 113 (H) 05/09/2018   LDLDIRECT 142.8 10/23/2012   TRIG 111 05/09/2018   CHOLHDL 4.9 05/09/2018

## 2019-02-12 ENCOUNTER — Other Ambulatory Visit: Payer: Self-pay | Admitting: Internal Medicine

## 2019-02-16 ENCOUNTER — Ambulatory Visit: Payer: 59 | Admitting: Internal Medicine

## 2019-03-05 DIAGNOSIS — M79671 Pain in right foot: Secondary | ICD-10-CM

## 2019-03-05 DIAGNOSIS — S92354A Nondisplaced fracture of fifth metatarsal bone, right foot, initial encounter for closed fracture: Secondary | ICD-10-CM | POA: Diagnosis not present

## 2019-03-05 MED FILL — GABAPENTIN 100 MG CAPSULE: 100 | 30 days supply | Qty: 180 | Fill #0 | Status: TO

## 2019-03-05 MED FILL — CELECOXIB 100 MG CAP: 100 | 30 days supply | Qty: 60 | Fill #0 | Status: TO

## 2019-03-06 ENCOUNTER — Other Ambulatory Visit: Payer: Self-pay

## 2019-03-06 MED ORDER — HYDROCODONE-ACETAMINOPHEN 10-325 MG PO TABS: 1.0000 | ORAL_TABLET | Freq: Two times a day (BID) | ORAL | 0 refills | Status: DC | PRN

## 2019-03-06 NOTE — Telephone Encounter (Signed)
Refilled: 02/05/2019 Last OV: 02/05/2019 Next OV: 05/04/2019

## 2019-03-19 DIAGNOSIS — S92354D Nondisplaced fracture of fifth metatarsal bone, right foot, subsequent encounter for fracture with routine healing: Secondary | ICD-10-CM | POA: Diagnosis not present

## 2019-03-19 DIAGNOSIS — S92354S Nondisplaced fracture of fifth metatarsal bone, right foot, sequela: Secondary | ICD-10-CM | POA: Diagnosis not present

## 2019-03-28 ENCOUNTER — Other Ambulatory Visit: Payer: Self-pay | Admitting: Internal Medicine

## 2019-03-28 ENCOUNTER — Other Ambulatory Visit: Payer: Self-pay

## 2019-03-28 MED ORDER — HYDROCODONE-ACETAMINOPHEN 10-325 MG PO TABS: 1.0000 | ORAL_TABLET | Freq: Four times a day (QID) | ORAL | 0 refills | Status: DC | PRN

## 2019-03-28 NOTE — Progress Notes (Signed)
Vicodin prescription amended for temporary increase to qty 4 daily to manage post operative foot pain

## 2019-03-28 NOTE — Telephone Encounter (Signed)
Refilled: 03/07/2019 Last OV: 02/05/2019 Next OV: 05/04/2019

## 2019-04-09 DIAGNOSIS — S92352D Displaced fracture of fifth metatarsal bone, left foot, subsequent encounter for fracture with routine healing: Secondary | ICD-10-CM | POA: Diagnosis not present

## 2019-04-09 DIAGNOSIS — S92354S Nondisplaced fracture of fifth metatarsal bone, right foot, sequela: Secondary | ICD-10-CM | POA: Diagnosis not present

## 2019-04-09 DIAGNOSIS — S92351D Displaced fracture of fifth metatarsal bone, right foot, subsequent encounter for fracture with routine healing: Secondary | ICD-10-CM | POA: Diagnosis not present

## 2019-04-23 ENCOUNTER — Other Ambulatory Visit: Payer: Self-pay | Admitting: Internal Medicine

## 2019-04-24 ENCOUNTER — Other Ambulatory Visit: Payer: Self-pay | Admitting: Internal Medicine

## 2019-04-25 ENCOUNTER — Other Ambulatory Visit: Payer: Self-pay

## 2019-04-25 MED ORDER — HYDROCODONE-ACETAMINOPHEN 10-325 MG PO TABS: 1.0000 | ORAL_TABLET | Freq: Four times a day (QID) | ORAL | 0 refills | Status: DC | PRN

## 2019-04-25 NOTE — Telephone Encounter (Signed)
Refilled: 03/28/2019 Last OV: 02/05/2019 Next OV: 05/04/2019

## 2019-05-04 ENCOUNTER — Ambulatory Visit: Payer: 59 | Admitting: Internal Medicine

## 2019-05-07 ENCOUNTER — Encounter: Payer: Self-pay | Admitting: Internal Medicine

## 2019-05-07 ENCOUNTER — Ambulatory Visit (INDEPENDENT_AMBULATORY_CARE_PROVIDER_SITE_OTHER): Payer: 59 | Admitting: Internal Medicine

## 2019-05-07 ENCOUNTER — Other Ambulatory Visit: Payer: Self-pay

## 2019-05-07 DIAGNOSIS — M4317 Spondylolisthesis, lumbosacral region: Secondary | ICD-10-CM | POA: Diagnosis not present

## 2019-05-07 NOTE — Assessment & Plan Note (Signed)
He has chronic leg pain secondary to traumatic injury, remote, and lumbar spondylosis with history of decompression in 2018 .  Pain has become more problematic in the last few months despite use of tramadol,  cymbalta and once daily vicodin due t 5th metatarsal fracture and use of orthopedic boot. .Continue tramadol for post traumatic OA and authorizing refill on Vicodin with a frequency of 4 time daily  Refill history confirmed via  Controlled Substance databas, accessed by me today.[name]

## 2019-05-07 NOTE — Progress Notes (Signed)
Virtual Visit via [email or web address]  This visit type was conducted due to national recommendations for restrictions regarding the COVID-19 pandemic (e.g. social distancing).  This format is felt to be most appropriate for this patient at this time.  All issues noted in this document were discussed and addressed.  No physical exam was performed (except for noted visual exam findings with Video Visits).   I connected with@ on 05/07/19 at  9:00 AM EDT by a video enabled telemedicine application  and verified that I am speaking with the correct person using two identifiers. Location patient: home Location provider: work or home office Persons participating in the virtual visit: patient, provider  I discussed the limitations, risks, security and privacy concerns of performing an evaluation and management service by telephone and the availability of in person appointments. I also discussed with the patient that there may be a patient responsible charge related to this service. The patient expressed understanding and agreed to proceed.  Reason for visit: medication refill  HPI: Presents for med refill on narcotics for chronic leg pain and back pain aggravated by current use of an orthopedic boot on his right foot to manage  5th metatarsal fracture.   His dose of  vicodin was increased  from qhs to qid in march .  The boot aggravated his  back pain and now is causing bilaterl hip pain as well.despite resting the leg at night . He has not had any surgery or PT and has orthopedic follow up next week.    .  Had lumbar decompression may 2017, was weaned off narcotics then.  Has been taking tramadol,  cymbalta and gabapentin    ROS: See pertinent positives and negatives per HPI.  Past Medical History:  Diagnosis Date  . Anxiety    takes Valium as needed  . Arthritis   . Chronic back pain    spondylolisthesis  . Depression    takes Zoloft daily  . Hyperlipidemia    takes Atorvastatin daily  . Hypertension    takes Micardis and AMlodipine daily  . Joint pain   . Joint swelling   . Peripheral edema    takes Lasix daily  . Urinary frequency    takes Rapaflo daily  . Weakness    numbness and tingling    Past Surgical History:  Procedure Laterality Date  . BACK SURGERY  04/2016  . COLONOSCOPY    . TIBIA FRACTURE SURGERY Bilateral 1989   multiple surgeries   . TIBIA HARDWARE REMOVAL  1989    Family History  Problem Relation Age of Onset  . Cancer Mother        lung Ca, tobacco abuse  . CAD Mother        MI at age 33  . Stroke Father   . Hypertension Father   . Heart disease Father        pacemaker, heart failure  . Kidney disease Father   . Ulcerative colitis Father   . Cancer Sister 59       colon CA  . Cancer Other        colon CA at age 13  . Ulcerative colitis Brother     SOCIAL HX:  reports that he quit smoking about 5 years ago. His smoking use included cigarettes. He started smoking about 41 years ago. He smoked 0.00 packs per day for 34.00 years. His smokeless tobacco use includes chew. He reports that he does not drink alcohol or use drugs.  Current Outpatient Medications:  .  amLODipine (NORVASC) 10 MG tablet, TAKE 1 TABLET BY MOUTH DAILY, Disp: 90 tablet, Rfl: 1 .  atorvastatin (LIPITOR) 20 MG tablet, TAKE 1 TABLET (20 MG TOTAL) BY MOUTH DAILY AT 6 PM., Disp: 90 tablet, Rfl: 1 .  celecoxib (CELEBREX) 100 MG capsule, Take 1 capsule (100 mg total) by mouth 2 (two) times daily., Disp: 60 capsule, Rfl: 5 .  clopidogrel (PLAVIX) 75 MG tablet, Take 1 tablet (75 mg total) by mouth daily., Disp: 90 tablet, Rfl: 3 .  DULoxetine (CYMBALTA) 30 MG capsule, TAKE 1 CAPSULE BY MOUTH TWICE DAILY, Disp: 180 capsule, Rfl: 1 .  furosemide (LASIX) 20 MG tablet, Take 1 tablet (20 mg total) by mouth daily as needed., Disp: 90 tablet, Rfl: 2 .  gabapentin (NEURONTIN) 100 MG capsule, TAKE 2 CAPSULES BY MOUTH 3 TIMES DAILY., Disp: 180 capsule, Rfl: 1 .  HYDROcodone-acetaminophen (NORCO)  10-325 MG tablet, Take 1 tablet by mouth every 12 (twelve) hours as needed., Disp: 60 tablet, Rfl: 0 .  HYDROcodone-acetaminophen (NORCO) 10-325 MG tablet, Take 1 tablet by mouth every 12 (twelve) hours as needed., Disp: 60 tablet, Rfl: 0 .  HYDROcodone-acetaminophen (NORCO) 10-325 MG tablet, Take 1 tablet by mouth every 6 (six) hours as needed for severe pain., Disp: 120 tablet, Rfl: 0 .  tizanidine (ZANAFLEX) 6 MG capsule, TAKE 1 CAPSULE (6MG ) BY MOUTH 3 TIMES DAILY AS NEEDED FOR MUSCLE SPASMS., Disp: 90 capsule, Rfl: 1 .  traMADol (ULTRAM) 50 MG tablet, TAKE 1 TABLET BY MOUTH EVERY 6 HOURS AS NEEDED FOR CHRONIC BACK AND LEG PAIN, Disp: 120 tablet, Rfl: 5 .  valsartan-hydrochlorothiazide (DIOVAN-HCT) 80-12.5 MG tablet, Take 1 tablet by mouth daily. , Disp: , Rfl:   EXAM:  VITALS per patient if applicable:  GENERAL: alert, oriented, appears well and in no acute distress  HEENT: atraumatic, conjunttiva clear, no obvious abnormalities on inspection of external nose and ears  NECK: normal movements of the head and neck  LUNGS: on inspection no signs of respiratory distress, breathing rate appears normal, no obvious gross SOB, gasping or wheezing  CV: no obvious cyanosis  MS: moves right leg with difficulty  PSYCH/NEURO: pleasant and cooperative, no obvious depression or anxiety, speech and thought processing grossly intact  ASSESSMENT AND PLAN:  Discussed the following assessment and plan:  Spondylolisthesis at L5-S1 level  Spondylolisthesis at L5-S1 level He has chronic leg pain secondary to traumatic injury, remote, and lumbar spondylosis with history of decompression in 2018 .  Pain has become more problematic in the last few months despite use of tramadol,  cymbalta and once daily vicodin due t 5th metatarsal fracture and use of orthopedic boot. .Continue tramadol for post traumatic OA and authorizing refill on Vicodin with a frequency of 4 time daily  Refill history confirmed via  [place] Controlled Substance databas, accessed by me today..    I discussed the assessment and treatment plan with the patient. The patient was provided an opportunity to ask questions and all were answered. The patient agreed with the plan and demonstrated an understanding of the instructions.   The patient was advised to call back or seek an in-person evaluation if the symptoms worsen or if the condition fails to improve as anticipated.  I provided  15 minutes of non-face-to-face time during this encounter.   Tyrone Mc, MD

## 2019-05-18 DIAGNOSIS — S92351D Displaced fracture of fifth metatarsal bone, right foot, subsequent encounter for fracture with routine healing: Secondary | ICD-10-CM | POA: Diagnosis not present

## 2019-05-23 ENCOUNTER — Other Ambulatory Visit: Payer: Self-pay

## 2019-05-23 ENCOUNTER — Other Ambulatory Visit: Payer: Self-pay | Admitting: Internal Medicine

## 2019-05-24 NOTE — Telephone Encounter (Signed)
Tramadol Refilled: 11/06/2018 Hydrocodone Refilled: 04/27/2019 Last OV: 05/07/2019 Next OV: not scheduled

## 2019-05-25 NOTE — Telephone Encounter (Signed)
vicodin refilled for jun 22 and tramadol refilled for today

## 2019-06-20 IMAGING — CT CT ANGIO HEAD
2 of 8 series · 8 of 35 positions shown · IV contrast (APPLIED)
Comparison: Head CT earlier same day and 05/03/2012.

CLINICAL DATA: Acute onset of dizziness an weakness beginning
yesterday. Weakness particularly left-sided. Negative acute head CT
today.

EXAM:
CT ANGIOGRAPHY HEAD AND NECK
TECHNIQUE: Multidetector CT imaging of the head and neck was performed using
the standard protocol during bolus administration of intravenous
contrast. Multiplanar CT image reconstructions and MIPs were
obtained to evaluate the vascular anatomy. Carotid stenosis
measurements (when applicable) are obtained utilizing NASCET
criteria, using the distal internal carotid diameter as the
denominator.
CONTRAST:  75mL 2EQNEH-Q08 IOPAMIDOL (2EQNEH-Q08) INJECTION 76%

[Series 6: ax thin · axial · 0.64mm/px · z∈[+114,+405]mm · 6 of 432 slices shown]
[im 62/432  soft-tissue]
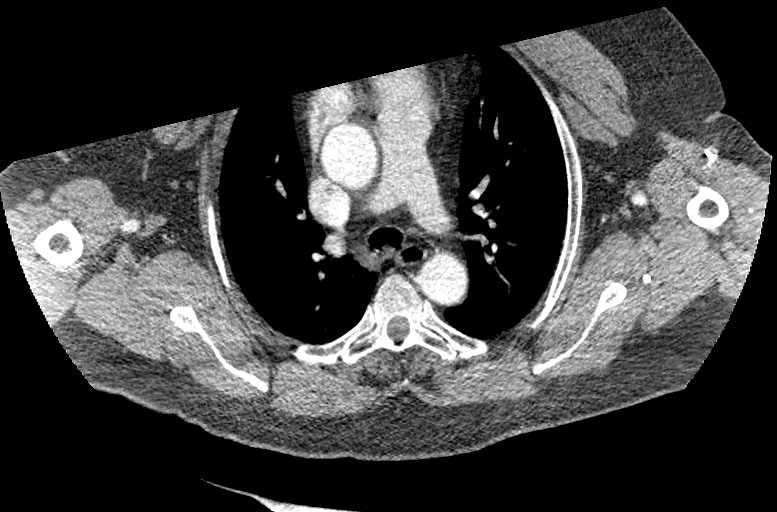
[im 124/432  bone]
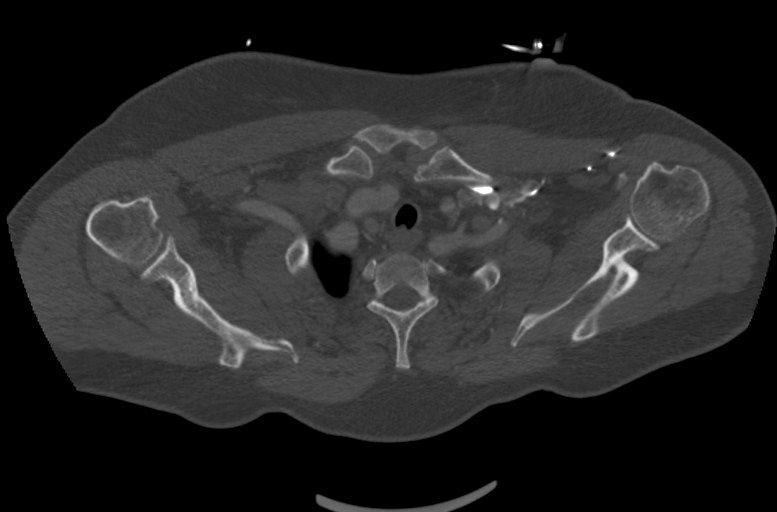
[im 185/432  soft-tissue]
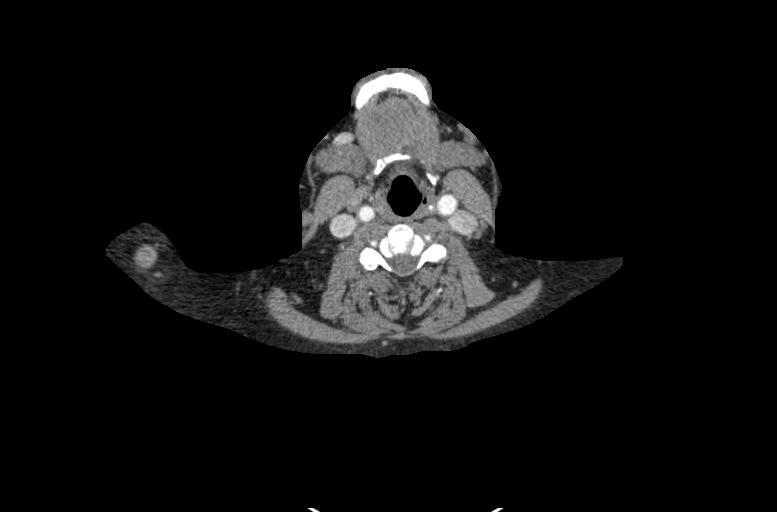
[im 247/432  bone]
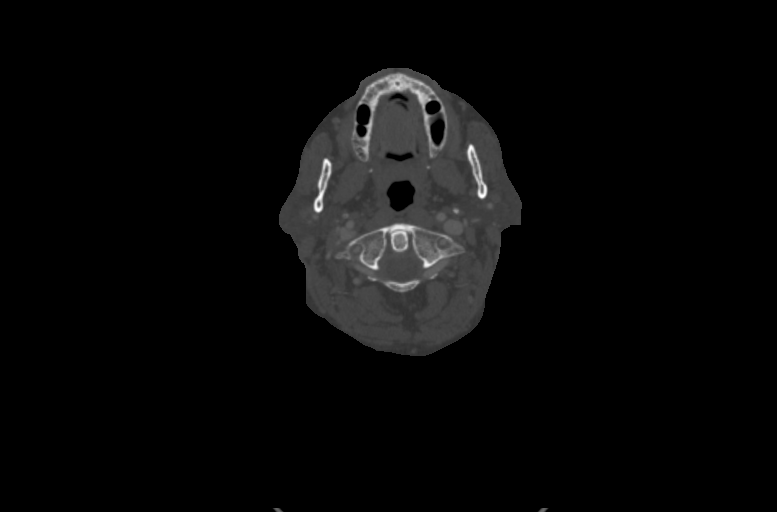
[im 308/432  soft-tissue]
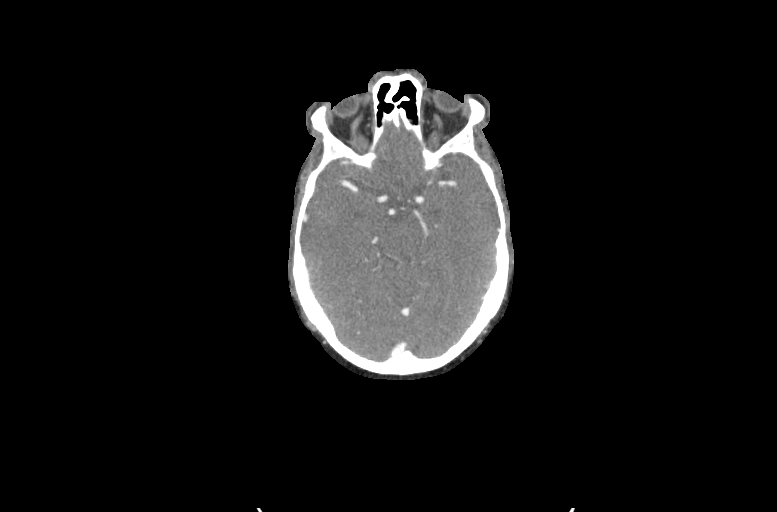
[im 370/432  bone]
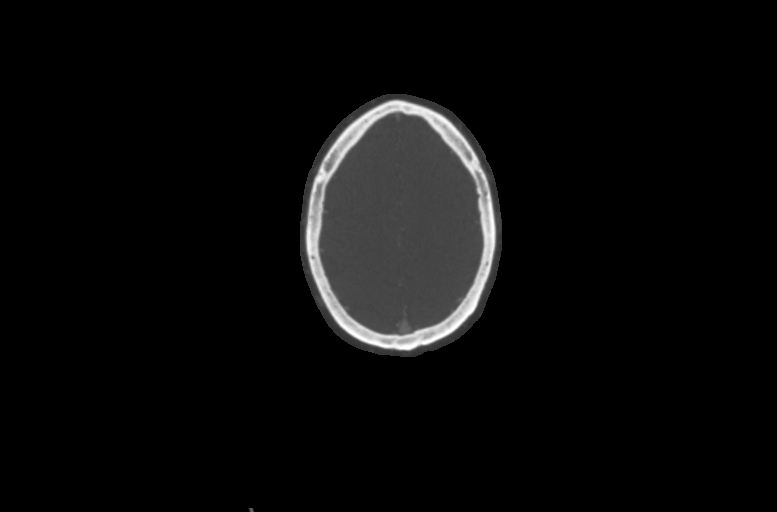

[Series 8: sagittal thin · sagittal · 0.64mm/px · 2 of 500 slices shown]
[im 181/500  soft-tissue]
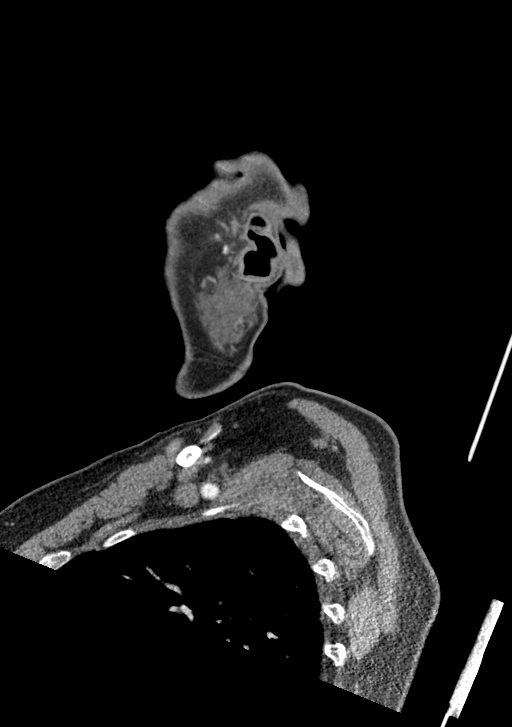
[im 312/500  soft-tissue]
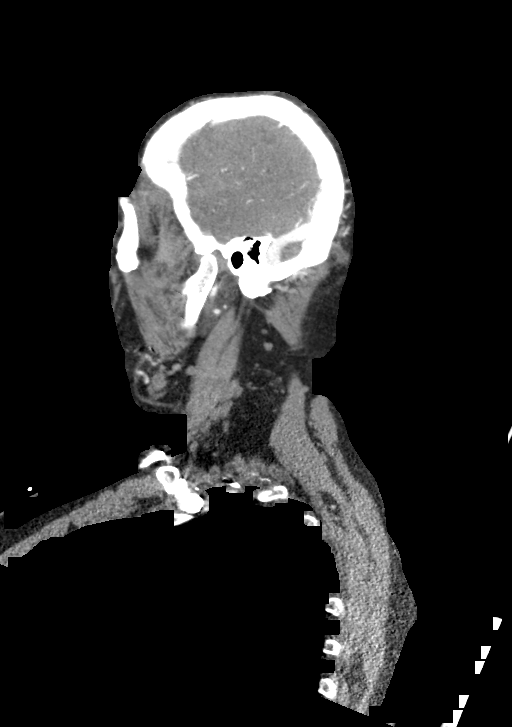

[8 of 35 positions shown; findings below may reference images not displayed]

FINDINGS: CTA NECK FINDINGS

Aortic arch: Mild aortic atherosclerosis. No aneurysm or dissection.
Branching pattern of the brachiocephalic vessels from the arch is
normal. No origin stenosis.

Right carotid system: Common carotid artery widely patent to the
bifurcation region. Soft and calcified plaque at the bifurcation and
ICA bulb but no stenosis. Cervical ICA tortuous but widely patent.

Left carotid system: Left common carotid artery widely patent to the
bifurcation region. Soft and calcified plaque at the carotid
bifurcation but no stenosis. Cervical ICA tortuous but widely
patent.

Vertebral arteries: Both vertebral arteries patent at their origins.
Narrowing and irregularity of both proximal vertebral arteries, more
severe on the right than the left. This could be due to advanced
diffuse atherosclerotic disease, but dissection is not excluded on
either side. More distally, the vessels appear widely patent and
normal.

Skeleton: Normal

Other neck: No mass or lymphadenopathy. The patient does have
thyroid goiter.

Upper chest: Lung apices are clear and normal.

Review of the MIP images confirms the above findings

CTA HEAD FINDINGS

Anterior circulation: Both internal carotid arteries are patent
through the skull base and siphon regions without stenosis. The
anterior and middle cerebral vessels are patent without proximal
stenosis, aneurysm or vascular malformation.

Posterior circulation: Both vertebral arteries are widely patent
through the foramen magnum to the basilar. No basilar stenosis.
Posterior circulation branch vessels appear patent and normal.

Venous sinuses: Patent and normal.

Anatomic variants: None significant.

Delayed phase: No abnormal enhancement.

Review of the MIP images confirms the above findings
IMPRESSION: Carotid bifurcation atherosclerosis but no stenosis or irregularity.
No intracranial anterior circulation abnormality.

Abnormal appearance of the proximal vertebral arteries bilaterally.
I think the appearance is more consistent with advanced
atherosclerotic disease, with narrowing and irregularity. I cannot
exclude the possibility of vertebral artery dissection on either or
both sides however. In the upper cervical region, the vertebral
arteries regain a normal appearance in the vessels are widely patent
through the foramen magnum to the basilar, without identifiable
intracranial posterior circulation abnormality.

## 2019-06-26 ENCOUNTER — Other Ambulatory Visit: Payer: Self-pay

## 2019-06-26 MED ORDER — HYDROCODONE-ACETAMINOPHEN 10-325 MG PO TABS: 1.0000 | ORAL_TABLET | Freq: Two times a day (BID) | ORAL | 0 refills | Status: DC | PRN

## 2019-06-29 DIAGNOSIS — S92351D Displaced fracture of fifth metatarsal bone, right foot, subsequent encounter for fracture with routine healing: Secondary | ICD-10-CM | POA: Diagnosis not present

## 2019-07-18 ENCOUNTER — Encounter (INDEPENDENT_AMBULATORY_CARE_PROVIDER_SITE_OTHER): Payer: 59 | Admitting: Ophthalmology

## 2019-07-18 ENCOUNTER — Telehealth (INDEPENDENT_AMBULATORY_CARE_PROVIDER_SITE_OTHER): Payer: Self-pay

## 2019-07-23 ENCOUNTER — Ambulatory Visit (INDEPENDENT_AMBULATORY_CARE_PROVIDER_SITE_OTHER): Payer: 59 | Admitting: Ophthalmology

## 2019-07-23 ENCOUNTER — Other Ambulatory Visit: Payer: Self-pay

## 2019-07-23 ENCOUNTER — Encounter (INDEPENDENT_AMBULATORY_CARE_PROVIDER_SITE_OTHER): Payer: Self-pay | Admitting: Ophthalmology

## 2019-07-23 DIAGNOSIS — H3581 Retinal edema: Secondary | ICD-10-CM | POA: Diagnosis not present

## 2019-07-23 DIAGNOSIS — H43811 Vitreous degeneration, right eye: Secondary | ICD-10-CM | POA: Diagnosis not present

## 2019-07-23 DIAGNOSIS — H25813 Combined forms of age-related cataract, bilateral: Secondary | ICD-10-CM

## 2019-07-23 DIAGNOSIS — I10 Essential (primary) hypertension: Secondary | ICD-10-CM | POA: Diagnosis not present

## 2019-07-23 DIAGNOSIS — H35033 Hypertensive retinopathy, bilateral: Secondary | ICD-10-CM

## 2019-07-23 DIAGNOSIS — H33323 Round hole, bilateral: Secondary | ICD-10-CM | POA: Diagnosis not present

## 2019-07-23 MED ORDER — PREDNISOLONE ACETATE 1 % OP SUSP: 1.0000 [drp] | Freq: Four times a day (QID) | OPHTHALMIC | 0 refills | Status: AC

## 2019-07-23 NOTE — Progress Notes (Signed)
[facility] Note  07/23/2019     CHIEF COMPLAINT Patient presents for Retina Evaluation   HISTORY OF PRESENT ILLNESS: Tyrone Mcdowell is a 63 y.o. male who presents to the clinic today for:   HPI    Retina Evaluation    In right eye.  This started weeks ago.  Duration of weeks.  Associated Symptoms Flashes and Floaters.  I, the attending physician,  performed the HPI with the patient and updated documentation appropriately.          Comments    New patient retina eval Patient states he began noticing floaters and flashes of light in his right eye only on Saturday 07/14/2019.  Patient did not notice a decrease in his vision or any pain in either eye.       Last edited by Bernarda Caffey, MD on 07/23/2019  1:50 PM. (History)    pt states a week ago Saturday he started seeing flashes of light in his right eye temporally, he states they are gone now, but he is still seeing floaters in that eye, pt denies having any health problems  Referring physician: [facility] [place],  [place] [address]  HISTORICAL INFORMATION:   Selected notes from the MEDICAL RECORD NUMBER Referred by Dr. Marvel Plan for flashes and floaters LEE:  Ocular Hx- PMH-   CURRENT MEDICATIONS: Current Outpatient Medications (Ophthalmic Drugs)  Medication Sig  . prednisoLONE acetate (PRED FORTE) 1 % ophthalmic suspension Place 1 drop into the right eye 4 (four) times daily for 7 days.   No current facility-administered medications for this visit.  (Ophthalmic Drugs)   Current Outpatient Medications (Other)  Medication Sig  . amLODipine (NORVASC) 10 MG tablet TAKE 1 TABLET BY MOUTH DAILY  . atorvastatin (LIPITOR) 20 MG tablet TAKE 1 TABLET (20 MG TOTAL) BY MOUTH DAILY AT 6 PM.  . celecoxib (CELEBREX) 100 MG capsule Take 1 capsule (100 mg total) by mouth 2 (two) times daily.  . clopidogrel (PLAVIX) 75 MG tablet Take 1 tablet (75 mg total) by mouth daily.  .  DULoxetine (CYMBALTA) 30 MG capsule TAKE 1 CAPSULE BY MOUTH TWICE DAILY  . furosemide (LASIX) 20 MG tablet Take 1 tablet (20 mg total) by mouth daily as needed.  . gabapentin (NEURONTIN) 100 MG capsule TAKE 2 CAPSULES BY MOUTH 3 TIMES DAILY.  [name] HYDROcodone-acetaminophen (NORCO) 10-325 MG tablet Take 1 tablet by mouth every 12 (twelve) hours as needed.  [name] HYDROcodone-acetaminophen (NORCO) 10-325 MG tablet TAKE 1 TABLET BY MOUTH EVERY 6 HOURS AS NEEDED FOR SEVERE PAIN.  . HYDROcodone-acetaminophen (NORCO) 10-325 MG tablet Take 1 tablet by mouth every 12 (twelve) hours as needed.  . tizanidine (ZANAFLEX) 6 MG capsule TAKE 1 CAPSULE (6MG ) BY MOUTH 3 TIMES DAILY AS NEEDED FOR MUSCLE SPASMS.  [name] traMADol (ULTRAM) 50 MG tablet TAKE 1 TABLET BY MOUTH EVERY 6 HOURS AS NEEDED FOR CHRONIC BACK AND LEG PAIN  . valsartan-hydrochlorothiazide (DIOVAN-HCT) 80-12.5 MG tablet Take 1 tablet by mouth daily.    No current facility-administered medications for this visit.  (Other)      REVIEW OF SYSTEMS: ROS    Positive for: Eyes   Negative for: Constitutional, Gastrointestinal, Neurological, Skin, Genitourinary, Musculoskeletal, HENT, Endocrine, Cardiovascular, Respiratory, Psychiatric, Allergic/Imm, Heme/Lymph   Last edited by Doneen Poisson on 07/23/2019  1:08 PM. (History)       ALLERGIES No Known Allergies  PAST MEDICAL HISTORY Past Medical History:  Diagnosis Date  .  Anxiety    takes Valium as needed  . Arthritis   . Chronic back pain    spondylolisthesis  . Depression    takes Zoloft daily  . Hyperlipidemia    takes Atorvastatin daily  . Hypertension    takes Micardis and AMlodipine daily  . Joint pain   . Joint swelling   . Peripheral edema    takes Lasix daily  . Urinary frequency    takes Rapaflo daily  . Weakness    numbness and tingling   Past Surgical History:  Procedure Laterality Date  . BACK SURGERY  04/2016  . COLONOSCOPY    . TIBIA FRACTURE SURGERY Bilateral 1989    multiple surgeries   . TIBIA HARDWARE REMOVAL  1989    FAMILY HISTORY Family History  Problem Relation Age of Onset  . Cancer Mother        lung Ca, tobacco abuse  . CAD Mother        MI at age 50  . Stroke Father   . Hypertension Father   . Heart disease Father        pacemaker, heart failure  . Kidney disease Father   . Ulcerative colitis Father   . Cancer Sister 75       colon CA  . Cancer Other        colon CA at age 26  . Ulcerative colitis Brother     SOCIAL HISTORY Social History   Tobacco Use  . Smoking status: Former Smoker    Packs/day: 0.00    Years: 34.00    Pack years: 0.00    Types: Cigarettes    Start date: 1979    Quit date: 2015    Years since quitting: 5.6  . Smokeless tobacco: Current User    Types: Chew  . Tobacco comment: 34 year pack history  Substance Use Topics  . Alcohol use: No    Alcohol/week: 0.0 standard drinks    Comment: no alcohol in over a yr  . Drug use: No         OPHTHALMIC EXAM:  Base Eye Exam    Visual Acuity (Snellen - Linear)      Right Left   Dist cc 20/30 +1 20/20   Dist ph cc 20/25 -2    Correction: Glasses       Tonometry (Tonopen, 1:13 PM)      Right Left   Pressure 20 16       Pupils      Dark Light Shape React APD   Right 3 2 Round Brisk 0   Left 3 2 Round Brisk 0       Visual Fields      Left Right    Full Full       Extraocular Movement      Right Left    Full Full       Neuro/Psych    Oriented x3: Yes   Mood/Affect: Normal       Dilation    Both eyes: 1.0% Mydriacyl, 2.5% Phenylephrine @ 1:14 PM        Slit Lamp and Fundus Exam    Slit Lamp Exam      Right Left   Lids/Lashes Dermatochalasis - upper lid Dermatochalasis - upper lid   Conjunctiva/Sclera mild nasal and temporal Pinguecula mild nasal and temporal Pinguecula   Cornea mild tear film debris, 1+ Punctate epithelial erosions, Arcus mild tear film debris, 1+ Punctate epithelial erosions, Arcus  Anterior Chamber Deep  and quiet Deep and quiet   Iris Round and dilated Round and dilated   Lens 2+ Nuclear sclerosis, 2+ Cortical cataract 2+ Nuclear sclerosis, 2+ Cortical cataract   Vitreous Vitreous syneresis, no pigment, Posterior vitreous detachment, blood stained vitreous condensations Vitreous syneresis       Fundus Exam      Right Left   Disc Pink and Sharp Pink and Sharp, mild temporal PPA   C/D Ratio 0.4 0.6   Macula Flat, Blunted foveal reflex, No heme or edema Flat, Blunted foveal reflex, mild Retinal pigment epithelial mottling, No heme or edema   Vessels Vascular attenuation, Tortuous, mild AV crossing changes Vascular attenuation, Tortuous, mild AV crossing changes   Periphery Attached, round operculated hole at 1030 with +pigment and +heme, no SRF Attached, pigmented CR scar / old hole at 0100, no SRF        Refraction    Wearing Rx      Sphere Cylinder Axis Add   Right -1.00 +0.50 035 +2.50   Left -1.00 Sphere  +2.50   Age: 59 yr   Type: prog       Manifest Refraction      Sphere Cylinder Axis Dist VA   Right -1.50 +0.50 035 20/25-2   Left -1.00 Sphere  20/20          IMAGING AND PROCEDURES  Imaging and Procedures for @TODAY @  OCT, Retina - OU - Both Eyes       Right Eye Quality was good. Central Foveal Thickness: 277. Progression has no prior data. Findings include normal foveal contour, no IRF, no SRF (Mild vitreous opacities, trace ERM).   Left Eye Quality was good. Central Foveal Thickness: 252. Progression has no prior data. Findings include normal foveal contour, no IRF, no SRF, vitreomacular adhesion .   Notes *Images captured and stored on drive  Diagnosis / Impression:  NFP, no IRF/SRF OU VMA OS  Clinical management:  See below  Abbreviations: NFP - Normal foveal profile. CME - cystoid macular edema. PED - pigment epithelial detachment. IRF - intraretinal fluid. SRF - subretinal fluid. EZ - ellipsoid zone. ERM - epiretinal membrane. ORA - outer retinal  atrophy. ORT - outer retinal tubulation. SRHM - subretinal hyper-reflective material        Repair Retinal Breaks, Laser - OD - Right Eye       Tear locations include temporal, nasal.   Time Out Confirmed correct patient, procedure, site, and patient consented.   Anesthesia No anesthesia was used. Anesthetic medications included Tetracaine 0.5%.   Post-op The patient tolerated the procedure well. There were no complications. The patient received written and verbal post procedure care education.   Notes LASER PROCEDURE NOTE  Procedure:  Barrier laser retinopexy using slit lamp laser, right eye   Diagnosis:   Operculated retinal hole, right eye                     1030 o'clock  Surgeon: Bernarda Caffey, MD, PhD  Anesthesia: Topical  Informed consent obtained, operative eye marked, and time out performed prior to initiation of laser.   Laser settings:  Lumenis [id number] laser, slit lamp Lens: Mainster PRP 165 Power: 300 mW Spot size: 200 microns Duration: 30 msec  # spots: 377  Placement of laser: Using a Mainster PRP 165 contact lens at the slit lamp, laser was placed in three confluent rows around operculated retinal hole at 1030 oclock anterior to equator with additional  rows anteriorly.  Complications: None.  Patient tolerated the procedure well and received written and verbal post-procedure care information/education.                  ASSESSMENT/PLAN:    ICD-10-CM   1. Retinal hole of both eyes  H33.323 Repair Retinal Breaks, Laser - OD - Right Eye  2. Posterior vitreous detachment of right eye  H43.811   3. Retinal edema  H35.81 OCT, Retina - OU - Both Eyes  4. Essential hypertension  I10   5. Hypertensive retinopathy of both eyes  H35.033   6. Combined forms of age-related cataract of both eyes  H25.813     1. Round retinal hole OU   - The incidence, risk factors, and natural history of retinal hole was discussed with patient.   - Potential  treatment options including laser retinopexy and cryotherapy discussed with patient. - OD: operculated retinal hole at 1030 - OS: round, pigmented retinal hole at 0100 - recommend laser retinopexy OU, OD first today 8.17.2020 - RBA of procedure discussed, questions answered - informed consent obtained and signed - see procedure note - start PF QID OD x7 days - f/u in 2 wks -- POV OD, laser retinopexy OS  2. PVD / vitreous syneresis OD  Discussed findings and prognosis  No retinal hole as above; no other RT or RD on 360 scleral depressed exam  Reviewed s/s of RT/RD  Strict return precautions for any such RT/RD signs/symptoms  3. No retinal edema on exam or OCT  4,5. Hypertensive retinopathy OU  - discussed importance of tight BP control  - monitor  6. Mixed form age related cataract OU   - The symptoms of cataract, surgical options, and treatments and risks were discussed with patient.  - discussed diagnosis and progression  - not yet visually significant  - monitor for now   Ophthalmic Meds Ordered this visit:  Meds ordered this encounter  Medications  . prednisoLONE acetate (PRED FORTE) 1 % ophthalmic suspension    Sig: Place 1 drop into the right eye 4 (four) times daily for 7 days.    Dispense:  10 mL    Refill:  0       Return in about 2 weeks (around 08/06/2019) for f/u POV OD, laser OS.  There are no Patient Instructions on file for this visit.   Explained the diagnoses, plan, and follow up with the patient and they expressed understanding.  Patient expressed understanding of the importance of proper follow up care.   This document serves as a record of services personally performed by Gardiner Sleeper, MD, PhD. It was created on their behalf by Ernest Mallick, OA, an ophthalmic assistant. The creation of this record is the provider's dictation and/or activities during the visit.    Electronically signed by: Ernest Mallick, OA  08.17.2020 11:13 PM    Gardiner Sleeper, M.D., Ph.D. Diseases & Surgery of the Retina and Vitreous [facility]  I have reviewed the above documentation for accuracy and completeness, and I agree with the above. Gardiner Sleeper, M.D., Ph.D. 07/23/19 11:13 PM   Abbreviations: M myopia (nearsighted); A astigmatism; H hyperopia (farsighted); P presbyopia; Mrx spectacle prescription;  CTL contact lenses; OD right eye; OS left eye; OU both eyes  XT exotropia; ET esotropia; PEK punctate epithelial keratitis; PEE punctate epithelial erosions; DES dry eye syndrome; MGD meibomian gland dysfunction; ATs artificial tears; PFAT's preservative free artificial tears; [place]  nuclear sclerotic cataract; PSC posterior subcapsular cataract; ERM epi-retinal membrane; PVD posterior vitreous detachment; RD retinal detachment; DM diabetes mellitus; DR diabetic retinopathy; NPDR non-proliferative diabetic retinopathy; PDR proliferative diabetic retinopathy; CSME clinically significant macular edema; DME diabetic macular edema; dbh dot blot hemorrhages; CWS cotton wool spot; POAG primary open angle glaucoma; C/D cup-to-disc ratio; HVF humphrey visual field; GVF goldmann visual field; OCT optical coherence tomography; IOP intraocular pressure; BRVO Branch retinal vein occlusion; CRVO central retinal vein occlusion; CRAO central retinal artery occlusion; BRAO branch retinal artery occlusion; RT retinal tear; SB scleral buckle; PPV pars plana vitrectomy; VH Vitreous hemorrhage; PRP panretinal laser photocoagulation; IVK intravitreal kenalog; VMT vitreomacular traction; MH Macular hole;  NVD neovascularization of the disc; NVE neovascularization elsewhere; AREDS age related eye disease study; ARMD age related macular degeneration; POAG primary open angle glaucoma; EBMD epithelial/anterior basement membrane dystrophy; ACIOL anterior chamber intraocular lens; IOL intraocular lens; PCIOL posterior chamber intraocular lens; Phaco/IOL phacoemulsification  with intraocular lens placement; Kendrick photorefractive keratectomy; LASIK laser assisted in situ keratomileusis; HTN hypertension; DM diabetes mellitus; COPD chronic obstructive pulmonary disease

## 2019-07-24 ENCOUNTER — Other Ambulatory Visit: Payer: Self-pay

## 2019-07-24 ENCOUNTER — Other Ambulatory Visit: Payer: Self-pay | Admitting: Internal Medicine

## 2019-07-24 ENCOUNTER — Other Ambulatory Visit: Payer: Self-pay | Admitting: Adult Health

## 2019-07-25 ENCOUNTER — Other Ambulatory Visit: Payer: Self-pay | Admitting: Internal Medicine

## 2019-07-25 MED ORDER — HYDROCODONE-ACETAMINOPHEN 10-325 MG PO TABS: 1.0000 | ORAL_TABLET | Freq: Two times a day (BID) | ORAL | 0 refills | Status: DC | PRN

## 2019-07-25 NOTE — Telephone Encounter (Signed)
Vicodin Refill ed  for 30 days only.  OFFICE VISIT NEEDED prior to any more refills

## 2019-07-25 NOTE — Telephone Encounter (Signed)
Thanks for replying Dr. Derrel Nip.

## 2019-07-25 NOTE — Telephone Encounter (Signed)
Refilled: 06/26/2019 Last OV: 05/07/2019 Next OV: not scheduled

## 2019-07-26 ENCOUNTER — Other Ambulatory Visit: Payer: Self-pay | Admitting: Internal Medicine

## 2019-07-27 NOTE — Telephone Encounter (Signed)
LMTCB

## 2019-07-27 NOTE — Telephone Encounter (Signed)
This was last filled by a historical provider.

## 2019-07-30 NOTE — Telephone Encounter (Signed)
Pt is scheduled for 08/15/2019.

## 2019-08-02 NOTE — Progress Notes (Signed)
[facility] Note  08/06/2019     CHIEF COMPLAINT Patient presents for Post-op Follow-up   HISTORY OF PRESENT ILLNESS: Tyrone Mcdowell is a 63 y.o. male who presents to the clinic today for:   HPI    Post-op Follow-up    In right eye.  Discomfort includes none.  Vision is stable.  I, the attending physician,  performed the HPI with the patient and updated documentation appropriately.          Comments    Patient states vision is stable OU.  Patient still has floaters in both eyes; denies fol.  Patient denies eye pain or discomfort OU.       Last edited by Bernarda Caffey, MD on 08/06/2019  3:22 PM. (History)    pt states he had no problems after the laser procedure on his right eye and he is ready to have his left eye done  Referring physician: [name], [address] [place],  [place] [address]  HISTORICAL INFORMATION:   Selected notes from the MEDICAL RECORD NUMBER Referred by Dr. Marvel Plan for flashes and floaters LEE:  Ocular Hx- PMH-   CURRENT MEDICATIONS: No current outpatient medications on file. (Ophthalmic Drugs)   No current facility-administered medications for this visit.  (Ophthalmic Drugs)   Current Outpatient Medications (Other)  Medication Sig  . amLODipine (NORVASC) 10 MG tablet TAKE 1 TABLET BY MOUTH DAILY  . atorvastatin (LIPITOR) 20 MG tablet TAKE 1 TABLET (20 MG TOTAL) BY MOUTH DAILY AT 6 PM.  . celecoxib (CELEBREX) 100 MG capsule Take 1 capsule (100 mg total) by mouth 2 (two) times daily.  . clopidogrel (PLAVIX) 75 MG tablet TAKE 1 TABLET BY MOUTH DAILY  . DULoxetine (CYMBALTA) 30 MG capsule TAKE 1 CAPSULE BY MOUTH TWICE DAILY  . furosemide (LASIX) 20 MG tablet Take 1 tablet (20 mg total) by mouth daily as needed.  . gabapentin (NEURONTIN) 100 MG capsule TAKE 2 CAPSULES BY MOUTH 3 TIMES DAILY.  [name] HYDROcodone-acetaminophen (NORCO) 10-325 MG tablet Take 1 tablet by mouth every 12 (twelve) hours as  needed.  [name] HYDROcodone-acetaminophen (NORCO) 10-325 MG tablet TAKE 1 TABLET BY MOUTH EVERY 6 HOURS AS NEEDED FOR SEVERE PAIN.  . HYDROcodone-acetaminophen (NORCO) 10-325 MG tablet Take 1 tablet by mouth every 12 (twelve) hours as needed.  . tizanidine (ZANAFLEX) 6 MG capsule TAKE 1 CAPSULE (6MG ) BY MOUTH 3 TIMES DAILY AS NEEDED FOR MUSCLE SPASMS.  [name] traMADol (ULTRAM) 50 MG tablet TAKE 1 TABLET BY MOUTH EVERY 6 HOURS AS NEEDED FOR CHRONIC BACK AND LEG PAIN  . valsartan-hydrochlorothiazide (DIOVAN-HCT) 80-12.5 MG tablet TAKE 2 TABLETS BY MOUTH (160/25MG ) ONCE A DAY   No current facility-administered medications for this visit.  (Other)      REVIEW OF SYSTEMS: ROS    Positive for: Eyes   Negative for: Constitutional, Gastrointestinal, Neurological, Skin, Genitourinary, Musculoskeletal, HENT, Endocrine, Cardiovascular, Respiratory, Psychiatric, Allergic/Imm, Heme/Lymph   Last edited by Doneen Poisson on 08/06/2019  1:28 PM. (History)       ALLERGIES No Known Allergies  PAST MEDICAL HISTORY Past Medical History:  Diagnosis Date  . Anxiety    takes Valium as needed  . Arthritis   . Chronic back pain    spondylolisthesis  . Depression    takes Zoloft daily  . Hyperlipidemia    takes Atorvastatin daily  . Hypertension    takes Micardis and AMlodipine daily  . Joint pain   .  Joint swelling   . Peripheral edema    takes Lasix daily  . Urinary frequency    takes Rapaflo daily  . Weakness    numbness and tingling   Past Surgical History:  Procedure Laterality Date  . BACK SURGERY  04/2016  . COLONOSCOPY    . TIBIA FRACTURE SURGERY Bilateral 1989   multiple surgeries   . TIBIA HARDWARE REMOVAL  1989    FAMILY HISTORY Family History  Problem Relation Age of Onset  . Cancer Mother        lung Ca, tobacco abuse  . CAD Mother        MI at age 53  . Stroke Father   . Hypertension Father   . Heart disease Father        pacemaker, heart failure  . Kidney disease  Father   . Ulcerative colitis Father   . Cancer Sister 7       colon CA  . Cancer Other        colon CA at age 38  . Ulcerative colitis Brother     SOCIAL HISTORY Social History   Tobacco Use  . Smoking status: Former Smoker    Packs/day: 0.00    Years: 34.00    Pack years: 0.00    Types: Cigarettes    Start date: 1979    Quit date: 2015    Years since quitting: 5.6  . Smokeless tobacco: Current User    Types: Chew  . Tobacco comment: 34 year pack history  Substance Use Topics  . Alcohol use: No    Alcohol/week: 0.0 standard drinks    Comment: no alcohol in over a yr  . Drug use: No         OPHTHALMIC EXAM:  Base Eye Exam    Visual Acuity (Snellen - Linear)      Right Left   Dist cc 20/20 -3 20/20 -1   Correction: Glasses       Tonometry (Tonopen, 1:28 PM)      Right Left   Pressure 17 18       Pupils      Dark Light Shape React APD   Right 3 2 Round Brisk 0   Left 3 2 Round Brisk 0       Visual Fields      Left Right    Full Full       Extraocular Movement      Right Left    Full Full       Neuro/Psych    Oriented x3: Yes   Mood/Affect: Normal       Dilation    Both eyes: 1.0% Mydriacyl, 2.5% Phenylephrine @ 1:28 PM        Slit Lamp and Fundus Exam    Slit Lamp Exam      Right Left   Lids/Lashes Dermatochalasis - upper lid Dermatochalasis - upper lid   Conjunctiva/Sclera mild nasal and temporal Pinguecula mild nasal and temporal Pinguecula   Cornea mild tear film debris, 1+ Punctate epithelial erosions, Arcus mild tear film debris, 1+ Punctate epithelial erosions, Arcus   Anterior Chamber Deep and quiet Deep and quiet   Iris Round and dilated Round and dilated   Lens 2+ Nuclear sclerosis, 2+ Cortical cataract 2+ Nuclear sclerosis, 2+ Cortical cataract   Vitreous Vitreous syneresis, no pigment, Posterior vitreous detachment, blood stained vitreous condensations settling inferiorly Vitreous syneresis       Fundus Exam  Right  Left   Disc Pink and Sharp Pink and Sharp, mild temporal PPA   C/D Ratio 0.4 0.5   Macula Flat, Blunted foveal reflex, mild RPE mottling and clumping, No heme or edema Flat, Blunted foveal reflex, Retinal pigment epithelial mottling and clumping, No heme or edema   Vessels Vascular attenuation, Tortuous, mild AV crossing changes Vascular attenuation, Tortuous   Periphery Attached, round operculated hole at 1030 with +pigment and +heme, no SRF -- good laser surrounding Attached, pigmented CR scar / old hole at 0100, no SRF        Refraction    Wearing Rx      Sphere Cylinder Axis Add   Right -1.00 +0.50 035 +2.50   Left -1.00 Sphere  +2.50   Type: prog          IMAGING AND PROCEDURES  Imaging and Procedures for @TODAY @  OCT, Retina - OU - Both Eyes       Right Eye Quality was good. Central Foveal Thickness: 285. Progression has been stable. Findings include normal foveal contour, no IRF, no SRF (Persistent vitreous opacities, trace ERM).   Left Eye Quality was good. Central Foveal Thickness: 259. Progression has been stable. Findings include normal foveal contour, no IRF, no SRF, vitreomacular adhesion .   Notes *Images captured and stored on drive  Diagnosis / Impression:  NFP, no IRF/SRF OU VMA OS  Clinical management:  See below  Abbreviations: NFP - Normal foveal profile. CME - cystoid macular edema. PED - pigment epithelial detachment. IRF - intraretinal fluid. SRF - subretinal fluid. EZ - ellipsoid zone. ERM - epiretinal membrane. ORA - outer retinal atrophy. ORT - outer retinal tubulation. SRHM - subretinal hyper-reflective material                 ASSESSMENT/PLAN:    ICD-10-CM   1. Retinal hole of both eyes  H33.323 CANCELED: Repair Retinal Breaks, Laser - OS - Left Eye  2. Posterior vitreous detachment of right eye  H43.811   3. Retinal edema  H35.81 OCT, Retina - OU - Both Eyes  4. Essential hypertension  I10   5. Hypertensive retinopathy of both  eyes  H35.033   6. Combined forms of age-related cataract of both eyes  H25.813     1. Round retinal hole OU    - OD: operculated retinal hole at 1030  - OS: round, pigmented retinal hole at 0100  - s/p laser retinopexy OD, 08.17.20 -- good laser surrounding  - recommend laser retinopexy OS today 8.31.2020  - RBA of procedure discussed, questions answered  - pt wishes to hold off on laser OS for now -- insurance coverage issues to be discussed with wife  - pt to reschedule laser OS once insurance issues ironed out  2. PVD / vitreous syneresis OD  - Discussed findings and prognosis  - No retinal hole as above; no other RT or RD on 360 scleral depressed exam  - Reviewed s/s of RT/RD  - Strict return precautions for any such RT/RD signs/symptoms  3. No retinal edema on exam or on OCT  4,5. Hypertensive retinopathy OU  - discussed importance of tight BP control  - monitor  6. Mixed form age related cataract OU   - The symptoms of cataract, surgical options, and treatments and risks were discussed with patient.  - discussed diagnosis and progression  - not yet visually significant  - monitor for now   Ophthalmic Meds Ordered this visit:  No  orders of the defined types were placed in this encounter.      Return in about 3 months (around 11/05/2019), or if symptoms worsen or fail to improve, for 3 mos or sooner prn.  There are no Patient Instructions on file for this visit.   Explained the diagnoses, plan, and follow up with the patient and they expressed understanding.  Patient expressed understanding of the importance of proper follow up care.   This document serves as a record of services personally performed by Gardiner Sleeper, MD, PhD. It was created on their behalf by Roselee Nova, COMT. The creation of this record is the provider's dictation and/or activities during the visit.  Electronically signed by: Roselee Nova, COMT 08/06/19 3:30 PM   This document serves as a  record of services personally performed by Gardiner Sleeper, MD, PhD. It was created on their behalf by Ernest Mallick, OA, an ophthalmic assistant. The creation of this record is the provider's dictation and/or activities during the visit.    Electronically signed by: Ernest Mallick, OA 08.31.2020 3:30 PM    Gardiner Sleeper, M.D., Ph.D. Diseases & Surgery of the Retina and Vitreous [facility]  I have reviewed the above documentation for accuracy and completeness, and I agree with the above. Gardiner Sleeper, M.D., Ph.D. 08/06/19 3:30 PM   Abbreviations: M myopia (nearsighted); A astigmatism; H hyperopia (farsighted); P presbyopia; Mrx spectacle prescription;  CTL contact lenses; OD right eye; OS left eye; OU both eyes  XT exotropia; ET esotropia; PEK punctate epithelial keratitis; PEE punctate epithelial erosions; DES dry eye syndrome; MGD meibomian gland dysfunction; ATs artificial tears; PFAT's preservative free artificial tears; [place] nuclear sclerotic cataract; PSC posterior subcapsular cataract; ERM epi-retinal membrane; PVD posterior vitreous detachment; RD retinal detachment; DM diabetes mellitus; DR diabetic retinopathy; NPDR non-proliferative diabetic retinopathy; PDR proliferative diabetic retinopathy; CSME clinically significant macular edema; DME diabetic macular edema; dbh dot blot hemorrhages; CWS cotton wool spot; POAG primary open angle glaucoma; C/D cup-to-disc ratio; HVF humphrey visual field; GVF goldmann visual field; OCT optical coherence tomography; IOP intraocular pressure; BRVO Branch retinal vein occlusion; CRVO central retinal vein occlusion; CRAO central retinal artery occlusion; BRAO branch retinal artery occlusion; RT retinal tear; SB scleral buckle; PPV pars plana vitrectomy; VH Vitreous hemorrhage; PRP panretinal laser photocoagulation; IVK intravitreal kenalog; VMT vitreomacular traction; MH Macular hole;  NVD neovascularization of the disc; NVE  neovascularization elsewhere; AREDS age related eye disease study; ARMD age related macular degeneration; POAG primary open angle glaucoma; EBMD epithelial/anterior basement membrane dystrophy; ACIOL anterior chamber intraocular lens; IOL intraocular lens; PCIOL posterior chamber intraocular lens; Phaco/IOL phacoemulsification with intraocular lens placement; [place] photorefractive keratectomy; LASIK laser assisted in situ keratomileusis; HTN hypertension; DM diabetes mellitus; COPD chronic obstructive pulmonary disease

## 2019-08-06 ENCOUNTER — Other Ambulatory Visit: Payer: Self-pay

## 2019-08-06 ENCOUNTER — Ambulatory Visit (INDEPENDENT_AMBULATORY_CARE_PROVIDER_SITE_OTHER): Payer: 59 | Admitting: Ophthalmology

## 2019-08-06 ENCOUNTER — Encounter (INDEPENDENT_AMBULATORY_CARE_PROVIDER_SITE_OTHER): Payer: Self-pay | Admitting: Ophthalmology

## 2019-08-06 DIAGNOSIS — H35033 Hypertensive retinopathy, bilateral: Secondary | ICD-10-CM

## 2019-08-06 DIAGNOSIS — H43811 Vitreous degeneration, right eye: Secondary | ICD-10-CM

## 2019-08-06 DIAGNOSIS — I10 Essential (primary) hypertension: Secondary | ICD-10-CM

## 2019-08-06 DIAGNOSIS — H25813 Combined forms of age-related cataract, bilateral: Secondary | ICD-10-CM

## 2019-08-06 DIAGNOSIS — H33323 Round hole, bilateral: Secondary | ICD-10-CM

## 2019-08-06 DIAGNOSIS — H3581 Retinal edema: Secondary | ICD-10-CM | POA: Diagnosis not present

## 2019-08-09 ENCOUNTER — Other Ambulatory Visit: Payer: Self-pay | Admitting: Internal Medicine

## 2019-08-15 ENCOUNTER — Other Ambulatory Visit: Payer: Self-pay

## 2019-08-15 ENCOUNTER — Encounter: Payer: Self-pay | Admitting: Internal Medicine

## 2019-08-15 ENCOUNTER — Ambulatory Visit (INDEPENDENT_AMBULATORY_CARE_PROVIDER_SITE_OTHER): Payer: 59 | Admitting: Internal Medicine

## 2019-08-15 VITALS — Ht 70.0 in | Wt 272.0 lb

## 2019-08-15 DIAGNOSIS — E785 Hyperlipidemia, unspecified: Secondary | ICD-10-CM

## 2019-08-15 DIAGNOSIS — I10 Essential (primary) hypertension: Secondary | ICD-10-CM | POA: Diagnosis not present

## 2019-08-15 DIAGNOSIS — G8929 Other chronic pain: Secondary | ICD-10-CM

## 2019-08-15 DIAGNOSIS — M79605 Pain in left leg: Secondary | ICD-10-CM

## 2019-08-15 DIAGNOSIS — E6609 Other obesity due to excess calories: Secondary | ICD-10-CM | POA: Diagnosis not present

## 2019-08-15 DIAGNOSIS — M79604 Pain in right leg: Secondary | ICD-10-CM

## 2019-08-15 DIAGNOSIS — Z6839 Body mass index (BMI) 39.0-39.9, adult: Secondary | ICD-10-CM | POA: Diagnosis not present

## 2019-08-15 DIAGNOSIS — M4317 Spondylolisthesis, lumbosacral region: Secondary | ICD-10-CM | POA: Diagnosis not present

## 2019-08-15 MED ORDER — PANTOPRAZOLE SODIUM 40 MG PO TBEC: 40.0000 mg | DELAYED_RELEASE_TABLET | Freq: Every day | ORAL | 3 refills | Status: DC

## 2019-08-15 MED ORDER — CELECOXIB 100 MG PO CAPS: 100.0000 mg | ORAL_CAPSULE | Freq: Two times a day (BID) | ORAL | 3 refills | Status: DC

## 2019-08-15 MED ORDER — HYDROCODONE-ACETAMINOPHEN 10-325 MG PO TABS: 1.0000 | ORAL_TABLET | Freq: Two times a day (BID) | ORAL | 0 refills | Status: DC | PRN

## 2019-08-15 MED ORDER — TRAMADOL HCL 50 MG PO TABS: 50.0000 mg | ORAL_TABLET | Freq: Four times a day (QID) | ORAL | 0 refills | Status: DC | PRN

## 2019-08-15 MED ORDER — ATORVASTATIN CALCIUM 20 MG PO TABS: ORAL_TABLET | ORAL | 1 refills | Status: DC

## 2019-08-15 NOTE — Patient Instructions (Addendum)
I have refilled your Vicodin for 3 months at a maximum of #60/month  STOP HOARDING THE TRAMADOL.  Do not fill unless you need it.  ADDING protonix (generic ) daily FOR GI PROPHYLAXIS from the risk of gastritis with celebrex.   You are scheduled to have fasting labs at 4 pm, after your flu vaccine   You should be able to download the gym letter from your [place]

## 2019-08-15 NOTE — Progress Notes (Signed)
Virtual Visit via [email or web address]  This visit type was conducted due to national recommendations for restrictions regarding the COVID-19 pandemic (e.g. social distancing).  This format is felt to be most appropriate for this patient at this time.  All issues noted in this document were discussed and addressed.  No physical exam was performed (except for noted visual exam findings with Video Visits).   I connected with@ on 08/15/19 at  4:30 PM EDT by a video enabled telemedicine application and verified that I am speaking with the correct person using two identifiers. Location patient: home Location provider: work or home office Persons participating in the virtual visit: patient, provider  I discussed the limitations, risks, security and privacy concerns of performing an evaluation and management service by telephone and the availability of in person appointments. I also discussed with the patient that there may be a patient responsible charge related to this service. The patient expressed understanding and agreed to proceed.  Reason for visit: 3 month follow up on chronic pain   HPI:  63 yr old with chronic pain secondary to lumbar spondylolisthesis and bilateral leg pain.  Using tramadol and vicodin ,  Using 2 vicodin daily due to increased activity providing home schooling for 3 grandchildren and still mowing lawn etc.  Not using as much tramadol but keeps refilling it.     Has gained weight,  Wants to return to gym to start a regular exercise program.  Needs permission  Hypertension: patient's wife is an Therapist, sports and checks blood pressure twice weekly at home.  Readings have been for the most part < 140/80 at rest . Patient is following a reduce salt diet most days and is taking medications as prescribed     ROS: Patient denies headache, fevers, malaise, unintentional weight loss, skin rash, eye pain, sinus congestion and sinus pain, sore throat, dysphagia,  hemoptysis , cough, dyspnea, wheezing, chest  pain, palpitations, orthopnea, edema, abdominal pain, nausea, melena, diarrhea, constipation, flank pain, dysuria, hematuria, urinary  Frequency, nocturia, numbness, tingling, seizures,  Focal weakness, Loss of consciousness,  Tremor, insomnia, depression, anxiety, and suicidal ideation.      Past Medical History:  Diagnosis Date  . Anxiety    takes Valium as needed  . Arthritis   . Chronic back pain    spondylolisthesis  . Depression    takes Zoloft daily  . Hyperlipidemia    takes Atorvastatin daily  . Hypertension    takes Micardis and AMlodipine daily  . Joint pain   . Joint swelling   . Peripheral edema    takes Lasix daily  . Urinary frequency    takes Rapaflo daily  . Weakness    numbness and tingling    Past Surgical History:  Procedure Laterality Date  . BACK SURGERY  04/2016  . COLONOSCOPY    . TIBIA FRACTURE SURGERY Bilateral 1989   multiple surgeries   . TIBIA HARDWARE REMOVAL  1989    Family History  Problem Relation Age of Onset  . Cancer Mother        lung Ca, tobacco abuse  . CAD Mother        MI at age 9  . Stroke Father   . Hypertension Father   . Heart disease Father        pacemaker, heart failure  . Kidney disease Father   . Ulcerative colitis Father   . Cancer Sister 81       colon CA  . Cancer Other  colon CA at age 61  . Ulcerative colitis Brother     SOCIAL HX:  reports that he quit smoking about 5 years ago. His smoking use included cigarettes. He started smoking about 41 years ago. He smoked 0.00 packs per day for 34.00 years. His smokeless tobacco use includes chew. He reports that he does not drink alcohol or use drugs.   Current Outpatient Medications:  .  amLODipine (NORVASC) 10 MG tablet, TAKE 1 TABLET BY MOUTH DAILY, Disp: 90 tablet, Rfl: 1 .  atorvastatin (LIPITOR) 20 MG tablet, TAKE 1 TABLET BY MOUTH DAILY AT 6 PM., Disp: 90 tablet, Rfl: 1 .  celecoxib (CELEBREX) 100 MG capsule, Take 1 capsule (100 mg total) by  mouth 2 (two) times daily., Disp: 180 capsule, Rfl: 3 .  clopidogrel (PLAVIX) 75 MG tablet, TAKE 1 TABLET BY MOUTH DAILY, Disp: 90 tablet, Rfl: 3 .  DULoxetine (CYMBALTA) 30 MG capsule, TAKE 1 CAPSULE BY MOUTH TWICE DAILY, Disp: 180 capsule, Rfl: 1 .  furosemide (LASIX) 20 MG tablet, Take 1 tablet (20 mg total) by mouth daily as needed., Disp: 90 tablet, Rfl: 2 .  gabapentin (NEURONTIN) 100 MG capsule, TAKE 2 CAPSULES BY MOUTH 3 TIMES DAILY., Disp: 180 capsule, Rfl: 1 .  [START ON 08/29/2019] HYDROcodone-acetaminophen (NORCO) 10-325 MG tablet, Take 1 tablet by mouth every 12 (twelve) hours as needed., Disp: 60 tablet, Rfl: 0 .  [START ON 09/28/2019] HYDROcodone-acetaminophen (NORCO) 10-325 MG tablet, Take 1 tablet by mouth every 12 (twelve) hours as needed., Disp: 60 tablet, Rfl: 0 .  [START ON 10/28/2019] HYDROcodone-acetaminophen (NORCO) 10-325 MG tablet, Take 1 tablet by mouth every 12 (twelve) hours as needed., Disp: 60 tablet, Rfl: 0 .  tizanidine (ZANAFLEX) 6 MG capsule, TAKE 1 CAPSULE (6MG ) BY MOUTH 3 TIMES DAILY AS NEEDED FOR MUSCLE SPASMS., Disp: 90 capsule, Rfl: 1 .  traMADol (ULTRAM) 50 MG tablet, Take 1 tablet (50 mg total) by mouth every 6 (six) hours as needed., Disp: 90 tablet, Rfl: 0 .  valsartan-hydrochlorothiazide (DIOVAN-HCT) 80-12.5 MG tablet, TAKE 2 TABLETS BY MOUTH (160/25MG ) ONCE A DAY, Disp: 180 tablet, Rfl: 0 .  pantoprazole (PROTONIX) 40 MG tablet, Take 1 tablet (40 mg total) by mouth daily., Disp: 30 tablet, Rfl: 3  EXAM:  VITALS per patient if applicable:  GENERAL: alert, oriented, appears well and in no acute distress  HEENT: atraumatic, conjunttiva clear, no obvious abnormalities on inspection of external nose and ears  NECK: normal movements of the head and neck  LUNGS: on inspection no signs of respiratory distress, breathing rate appears normal, no obvious gross SOB, gasping or wheezing  CV: no obvious cyanosis  MS: moves all visible extremities without  noticeable abnormality  PSYCH/NEURO: pleasant and cooperative, no obvious depression or anxiety, speech and thought processing grossly intact  ASSESSMENT AND PLAN:  Discussed the following assessment and plan:  Essential hypertension - Plan: Comprehensive metabolic panel  Hyperlipidemia LDL goal <100 - Plan: Lipid panel  Class 2 obesity due to excess calories without serious comorbidity with body mass index (BMI) of 39.0 to 39.9 in adult  Chronic pain of both lower extremities  Spondylolisthesis at L5-S1 level  Obesity I have congratulated him in reduction of   BMI and encouraged  Continued weight loss with goal of 10% of body weight over the next 6 months using a low glycemic index diet and regular exercise a minimum of 5 days per week.    Chronic leg pain Secondary to traumatic injury, remote, and  lumbar spondylosis with history of decompression in 2018 .  Pain has become more problematic in the last few months despite use of tramadol,  cymbalta and once daily vicodin .Continue tramadol for post traumatic OA and authorizing an increase use of vicodin  To twice daily . adding celebrex 100 mg bid.   Refill history confirmed via [place] Controlled Substance databas, accessed by me today.[name]  Spondylolisthesis at L5-S1 level He has chronic leg pain secondary to traumatic injury, remote, and lumbar spondylosis with history of decompression in 2018 .  Pain has become more problematic in the last few months despite use of tramadol,  cymbalta and once daily vicodin due to 5th metatarsal fracture and use of orthopedic boot. .Continue tramadol for post traumatic OA and authorizing refill on Vicodin with a frequency of 2 times daily  Refill history confirmed via [place] Controlled Substance databas, accessed by me today..  Hypertension Well controlled on current regimen. Renal function stable, no changes today.    I discussed the assessment and treatment plan with the patient. The patient was provided an  opportunity to ask questions and all were answered. The patient agreed with the plan and demonstrated an understanding of the instructions.   The patient was advised to call back or seek an in-person evaluation if the symptoms worsen or if the condition fails to improve as anticipated.  I provided 25 minutes of non-face-to-face time during this encounter counseling patient in use of narcotics ,  Role of  exercise and need for follow up    [name], MD

## 2019-08-16 NOTE — Assessment & Plan Note (Signed)
Well controlled on current regimen. Renal function stable, no changes today. 

## 2019-08-16 NOTE — Assessment & Plan Note (Signed)
He has chronic leg pain secondary to traumatic injury, remote, and lumbar spondylosis with history of decompression in 2018 .  Pain has become more problematic in the last few months despite use of tramadol,  cymbalta and once daily vicodin due to 5th metatarsal fracture and use of orthopedic boot. .Continue tramadol for post traumatic OA and authorizing refill on Vicodin with a frequency of 2 times daily  Refill history confirmed via [place] Controlled Substance databas, accessed by me today.[name]

## 2019-08-16 NOTE — Assessment & Plan Note (Signed)
I have congratulated him in reduction of   BMI and encouraged  Continued weight loss with goal of 10% of body weight over the next 6 months using a low glycemic index diet and regular exercise a minimum of 5 days per week.

## 2019-08-16 NOTE — Assessment & Plan Note (Signed)
Secondary to traumatic injury, remote, and lumbar spondylosis with history of decompression in 2018 .  Pain has become more problematic in the last few months despite use of tramadol,  cymbalta and once daily vicodin .Continue tramadol for post traumatic OA and authorizing an increase use of vicodin  To twice daily . adding celebrex 100 mg bid.   Refill history confirmed via [place] Controlled Substance databas, accessed by me today.[name]

## 2019-08-24 ENCOUNTER — Other Ambulatory Visit: Payer: Self-pay

## 2019-08-24 ENCOUNTER — Other Ambulatory Visit (INDEPENDENT_AMBULATORY_CARE_PROVIDER_SITE_OTHER): Payer: 59

## 2019-08-24 ENCOUNTER — Ambulatory Visit (INDEPENDENT_AMBULATORY_CARE_PROVIDER_SITE_OTHER): Payer: 59

## 2019-08-24 DIAGNOSIS — I10 Essential (primary) hypertension: Secondary | ICD-10-CM | POA: Diagnosis not present

## 2019-08-24 DIAGNOSIS — E785 Hyperlipidemia, unspecified: Secondary | ICD-10-CM

## 2019-08-24 DIAGNOSIS — Z23 Encounter for immunization: Secondary | ICD-10-CM

## 2019-08-24 MED ORDER — TRAMADOL HCL 50 MG PO TABS: 50.0000 mg | ORAL_TABLET | Freq: Four times a day (QID) | ORAL | 0 refills | Status: DC | PRN

## 2019-08-24 NOTE — Addendum Note (Signed)
Addended by: Leeanne Rio on: 08/24/2019 02:54 PM   Modules accepted: Orders

## 2019-08-25 LAB — COMPREHENSIVE METABOLIC PANEL
AG Ratio: 1.7 (calc) (ref 1.0–2.5)
ALT: 27 U/L (ref 9–46)
AST: 19 U/L (ref 10–35)
Albumin: 4.1 g/dL (ref 3.6–5.1)
Alkaline phosphatase (APISO): 95 U/L (ref 35–144)
BUN: 19 mg/dL (ref 7–25)
CO2: 30 mmol/L (ref 20–32)
Calcium: 10 mg/dL (ref 8.6–10.3)
Chloride: 102 mmol/L (ref 98–110)
Creat: 1.09 mg/dL (ref 0.70–1.25)
Globulin: 2.4 g/dL (calc) (ref 1.9–3.7)
Glucose, Bld: 91 mg/dL (ref 65–99)
Potassium: 4 mmol/L (ref 3.5–5.3)
Sodium: 142 mmol/L (ref 135–146)
Total Bilirubin: 0.2 mg/dL (ref 0.2–1.2)
Total Protein: 6.5 g/dL (ref 6.1–8.1)

## 2019-08-25 LAB — LIPID PANEL
Cholesterol: 128 mg/dL (ref <200)
HDL: 35 mg/dL — ABNORMAL LOW (ref >=40)
LDL Cholesterol (Calc): 66 mg/dL (calc)
Non-HDL Cholesterol (Calc): 93 mg/dL (calc) (ref <130)
Total CHOL/HDL Ratio: 3.7 (calc) (ref <5.0)
Triglycerides: 204 mg/dL — ABNORMAL HIGH (ref <150)

## 2019-09-24 ENCOUNTER — Other Ambulatory Visit: Payer: Self-pay | Admitting: Internal Medicine

## 2019-09-24 NOTE — Telephone Encounter (Signed)
Refilled: 08/24/2019 Last OV: 08/15/2019 Next OV: not scheduled

## 2019-10-18 NOTE — Progress Notes (Signed)
[facility] Note  10/23/2019     CHIEF COMPLAINT Patient presents for Retina Follow Up   HISTORY OF PRESENT ILLNESS: Tyrone Mcdowell is a 63 y.o. male who presents to the clinic today for:   HPI    Retina Follow Up    Patient presents with  Other.  In both eyes.  This started 3 months ago.  Severity is moderate.  I, the attending physician,  performed the HPI with the patient and updated documentation appropriately.          Comments    Patient here for 3 months retina follow up. Patient states vision doing ok. No eye pain.        Last edited by Bernarda Caffey, MD on 10/23/2019  5:08 PM. (History)    pt states he had no problems with the laser in the right eye at last visit, he states his floaters are clearing up   Referring physician: [facility] [place],  [place] [id number]  HISTORICAL INFORMATION:   Selected notes from the MEDICAL RECORD NUMBER Referred by Dr. Marvel Plan for flashes and floaters   CURRENT MEDICATIONS: Current Outpatient Medications (Ophthalmic Drugs)  Medication Sig  . prednisoLONE acetate (PRED FORTE) 1 % ophthalmic suspension Place 1 drop into the left eye 4 (four) times daily for 7 days.   No current facility-administered medications for this visit.  (Ophthalmic Drugs)   Current Outpatient Medications (Other)  Medication Sig  . amLODipine (NORVASC) 10 MG tablet TAKE 1 TABLET BY MOUTH DAILY  . atorvastatin (LIPITOR) 20 MG tablet TAKE 1 TABLET BY MOUTH DAILY AT 6 PM.  . celecoxib (CELEBREX) 100 MG capsule Take 1 capsule (100 mg total) by mouth 2 (two) times daily.  . clopidogrel (PLAVIX) 75 MG tablet TAKE 1 TABLET BY MOUTH DAILY  . DULoxetine (CYMBALTA) 30 MG capsule TAKE 1 CAPSULE BY MOUTH TWICE DAILY  . furosemide (LASIX) 20 MG tablet Take 1 tablet (20 mg total) by mouth daily as needed.  . gabapentin (NEURONTIN) 100 MG capsule TAKE 2 CAPSULES BY MOUTH 3 TIMES DAILY.  [name]  HYDROcodone-acetaminophen (NORCO) 10-325 MG tablet Take 1 tablet by mouth every 12 (twelve) hours as needed.  [name] HYDROcodone-acetaminophen (NORCO) 10-325 MG tablet Take 1 tablet by mouth every 12 (twelve) hours as needed.  Derrill Memo ON 10/28/2019] HYDROcodone-acetaminophen (NORCO) 10-325 MG tablet Take 1 tablet by mouth every 12 (twelve) hours as needed.  . pantoprazole (PROTONIX) 40 MG tablet Take 1 tablet (40 mg total) by mouth daily.  . tizanidine (ZANAFLEX) 6 MG capsule TAKE 1 CAPSULE (6MG ) BY MOUTH 3 TIMES DAILY AS NEEDED FOR MUSCLE SPASMS.  [name] traMADol (ULTRAM) 50 MG tablet TAKE 1 TABLET BY MOUTH EVERY 6 HOURS AS NEEDED.  [name] valsartan-hydrochlorothiazide (DIOVAN-HCT) 80-12.5 MG tablet TAKE 2 TABLETS BY MOUTH ONCE A DAY   No current facility-administered medications for this visit.  (Other)      REVIEW OF SYSTEMS: ROS    Positive for: Eyes   Negative for: Constitutional, Gastrointestinal, Neurological, Skin, Genitourinary, Musculoskeletal, HENT, Endocrine, Cardiovascular, Respiratory, Psychiatric, Allergic/Imm, Heme/Lymph   Last edited by Theodore Demark, COA on 10/23/2019  2:47 PM. (History)       ALLERGIES No Known Allergies  PAST MEDICAL HISTORY Past Medical History:  Diagnosis Date  . Anxiety    takes Valium as needed  . Arthritis   . Chronic back pain    spondylolisthesis  . Depression    takes Zoloft daily  .  Hyperlipidemia    takes Atorvastatin daily  . Hypertension    takes Micardis and AMlodipine daily  . Joint pain   . Joint swelling   . Peripheral edema    takes Lasix daily  . Urinary frequency    takes Rapaflo daily  . Weakness    numbness and tingling   Past Surgical History:  Procedure Laterality Date  . BACK SURGERY  04/2016  . COLONOSCOPY    . TIBIA FRACTURE SURGERY Bilateral 1989   multiple surgeries   . TIBIA HARDWARE REMOVAL  1989    FAMILY HISTORY Family History  Problem Relation Age of Onset  . Cancer Mother        lung Ca, tobacco  abuse  . CAD Mother        MI at age 41  . Stroke Father   . Hypertension Father   . Heart disease Father        pacemaker, heart failure  . Kidney disease Father   . Ulcerative colitis Father   . Cancer Sister 52       colon CA  . Cancer Other        colon CA at age 18  . Ulcerative colitis Brother     SOCIAL HISTORY Social History   Tobacco Use  . Smoking status: Former Smoker    Packs/day: 0.00    Years: 34.00    Pack years: 0.00    Types: Cigarettes    Start date: 1979    Quit date: 2015    Years since quitting: 5.8  . Smokeless tobacco: Current User    Types: Chew  . Tobacco comment: 34 year pack history  Substance Use Topics  . Alcohol use: No    Alcohol/week: 0.0 standard drinks    Comment: no alcohol in over a yr  . Drug use: No         OPHTHALMIC EXAM:  Base Eye Exam    Visual Acuity (Snellen - Linear)      Right Left   Dist cc 20/20 -2 20/20   Correction: Glasses       Tonometry (Tonopen, 2:45 PM)      Right Left   Pressure 21 19       Pupils      Dark Light Shape React APD   Right 3 2 Round Brisk None   Left 3 2 Round Brisk None       Visual Fields (Counting fingers)      Left Right    Full Full       Extraocular Movement      Right Left    Full, Ortho Full, Ortho       Neuro/Psych    Oriented x3: Yes   Mood/Affect: Normal       Dilation    Both eyes: 1.0% Mydriacyl, 2.5% Phenylephrine @ 2:45 PM        Slit Lamp and Fundus Exam    Slit Lamp Exam      Right Left   Lids/Lashes Dermatochalasis - upper lid Dermatochalasis - upper lid   Conjunctiva/Sclera mild nasal and temporal Pinguecula mild nasal and temporal Pinguecula   Cornea mild tear film debris, 1+ Punctate epithelial erosions, Arcus mild tear film debris, 1+ Punctate epithelial erosions, Arcus   Anterior Chamber Deep and quiet Deep and quiet   Iris Round and dilated Round and dilated   Lens 2+ Nuclear sclerosis, 2+ Cortical cataract 2+ Nuclear sclerosis, 2+  Cortical cataract  Vitreous Vitreous syneresis, no pigment, Posterior vitreous detachment, vitreous opacities / VH settled inferiorly - minimal Vitreous syneresis       Fundus Exam      Right Left   Disc Pink and Sharp Pink and Sharp, mild temporal PPA   C/D Ratio 0.4 0.5   Macula Flat, Blunted foveal reflex, mild RPE mottling and clumping, No heme or edema Flat, Blunted foveal reflex, Retinal pigment epithelial mottling and clumping, No heme or edema   Vessels Vascular attenuation, Tortuous, mild AV crossing changes Vascular attenuation, Tortuous   Periphery Attached, round operculated hole at 1030, no SRF -- good laser surrounding, heme resolved Attached, pigmented CR scar / old hole at 0100, no SRF, peripheral cystoid degeneration        Refraction    Wearing Rx      Sphere Cylinder Axis Add   Right -1.00 +0.50 035 +2.50   Left -1.00 Sphere  +2.50   Type: prog          IMAGING AND PROCEDURES  Imaging and Procedures for @TODAY @  OCT, Retina - OU - Both Eyes       Right Eye Quality was good. Central Foveal Thickness: 282. Progression has been stable. Findings include normal foveal contour, no IRF, no SRF (Interval improvement in vitreous opacities, trace ERM).   Left Eye Quality was good. Central Foveal Thickness: 258. Progression has been stable. Findings include normal foveal contour, no IRF, no SRF, vitreomacular adhesion .   Notes *Images captured and stored on drive  Diagnosis / Impression:  NFP, no IRF/SRF OU VMA OS  Clinical management:  See below  Abbreviations: NFP - Normal foveal profile. CME - cystoid macular edema. PED - pigment epithelial detachment. IRF - intraretinal fluid. SRF - subretinal fluid. EZ - ellipsoid zone. ERM - epiretinal membrane. ORA - outer retinal atrophy. ORT - outer retinal tubulation. SRHM - subretinal hyper-reflective material        Repair Retinal Breaks, Laser - OS - Left Eye       LASER PROCEDURE NOTE  Procedure:   Barrier laser retinopexy using slit lamp laser, LEFT eye   Diagnosis:   Retinal defects, LEFT eye                     VR tuft 0100 ora; round, pigmented retinal hole at 0100 o'clock anterior to equator   Surgeon: Bernarda Caffey, MD, PhD  Anesthesia: Topical  Informed consent obtained, operative eye marked, and time out performed prior to initiation of laser.   Laser settings:  Lumenis Smart532 laser, slit lamp Lens: [place] PRP 165 Power: 240 mW Spot size: 200 microns Duration: 50 msec  # spots: 204  Placement of laser: Using a [place]ster PRP 165 contact lens at the slit lamp, laser was placed in three confluent rows around retinal defects at 1 oclock with additional rows anteriorly.  Complications: None.  Patient tolerated the procedure well and received written and verbal post-procedure care information/education.                  ASSESSMENT/PLAN:    ICD-10-CM   1. Retinal hole of both eyes  H33.323 Repair Retinal Breaks, Laser - OS - Left Eye  2. Posterior vitreous detachment of right eye  H43.811   3. Retinal edema  H35.81 OCT, Retina - OU - Both Eyes  4. Essential hypertension  I10   5. Hypertensive retinopathy of both eyes  H35.033   6. Combined forms of age-related cataract  of both eyes  H25.813     1. Round retinal hole OU    - OD: operculated retinal hole at 1030  - OS: round, pigmented retinal hole at 0100  - s/p laser retinopexy OD, 08.17.20 -- good laser surrounding  - Recommend laser retinopexy OS today, 11.17.20  - pt wishes to proceed  - RBA of procedure discussed, questions answered  - start PF qid OS x7 days  - f/u 3-4 weeks for POV  2. PVD / vitreous syneresis OD  - Discussed findings and prognosis  - No retinal hole as above; no other RT or RD on 360 scleral depressed exam  - Reviewed s/s of RT/RD  - Strict return precautions for any such RT/RD signs/symptoms  3. No retinal edema on exam or on OCT  4,5. Hypertensive retinopathy OU  -  discussed importance of tight BP control  - monitor  6. Mixed form age related cataract OU   - The symptoms of cataract, surgical options, and treatments and risks were discussed with patient.  - discussed diagnosis and progression  - not yet visually significant  - monitor for now   Ophthalmic Meds Ordered this visit:  Meds ordered this encounter  Medications  . prednisoLONE acetate (PRED FORTE) 1 % ophthalmic suspension    Sig: Place 1 drop into the left eye 4 (four) times daily for 7 days.    Dispense:  10 mL    Refill:  0       Return for f/u 3-4 weeks s/p laser OS, DFE, OCT.  There are no Patient Instructions on file for this visit.   Explained the diagnoses, plan, and follow up with the patient and they expressed understanding.  Patient expressed understanding of the importance of proper follow up care.   This document serves as a record of services personally performed by Gardiner Sleeper, MD, PhD. It was created on their behalf by Estill Bakes, COT an ophthalmic technician. The creation of this record is the provider's dictation and/or activities during the visit.    Electronically signed by: Estill Bakes, COT 10/18/19 @ 5:20 PM   This document serves as a record of services personally performed by Gardiner Sleeper, MD, PhD. It was created on their behalf by Ernest Mallick, OA, an ophthalmic assistant. The creation of this record is the provider's dictation and/or activities during the visit.    Electronically signed by: Ernest Mallick, OA 11.17.2020 5:20 PM  Gardiner Sleeper, M.D., Ph.D. Diseases & Surgery of the Retina and [place] 10/23/2019   I have reviewed the above documentation for accuracy and completeness, and I agree with the above. Gardiner Sleeper, M.D., Ph.D. 10/23/19 5:20 PM   Abbreviations: M myopia (nearsighted); A astigmatism; H hyperopia (farsighted); P presbyopia; Mrx spectacle prescription;  CTL contact lenses; OD  right eye; OS left eye; OU both eyes  XT exotropia; ET esotropia; PEK punctate epithelial keratitis; PEE punctate epithelial erosions; DES dry eye syndrome; MGD meibomian gland dysfunction; ATs artificial tears; PFAT's preservative free artificial tears; [place] nuclear sclerotic cataract; PSC posterior subcapsular cataract; ERM epi-retinal membrane; PVD posterior vitreous detachment; RD retinal detachment; DM diabetes mellitus; DR diabetic retinopathy; NPDR non-proliferative diabetic retinopathy; PDR proliferative diabetic retinopathy; CSME clinically significant macular edema; DME diabetic macular edema; dbh dot blot hemorrhages; CWS cotton wool spot; POAG primary open angle glaucoma; C/D cup-to-disc ratio; HVF humphrey visual field; GVF goldmann visual field; OCT optical coherence tomography; IOP intraocular pressure; BRVO  Branch retinal vein occlusion; CRVO central retinal vein occlusion; CRAO central retinal artery occlusion; BRAO branch retinal artery occlusion; RT retinal tear; SB scleral buckle; PPV pars plana vitrectomy; VH Vitreous hemorrhage; PRP panretinal laser photocoagulation; IVK intravitreal kenalog; VMT vitreomacular traction; MH Macular hole;  NVD neovascularization of the disc; NVE neovascularization elsewhere; AREDS age related eye disease study; ARMD age related macular degeneration; POAG primary open angle glaucoma; EBMD epithelial/anterior basement membrane dystrophy; ACIOL anterior chamber intraocular lens; IOL intraocular lens; PCIOL posterior chamber intraocular lens; Phaco/IOL phacoemulsification with intraocular lens placement; [place] photorefractive keratectomy; LASIK laser assisted in situ keratomileusis; HTN hypertension; DM diabetes mellitus; COPD chronic obstructive pulmonary disease

## 2019-10-23 ENCOUNTER — Other Ambulatory Visit: Payer: Self-pay | Admitting: Internal Medicine

## 2019-10-23 ENCOUNTER — Other Ambulatory Visit: Payer: Self-pay

## 2019-10-23 ENCOUNTER — Ambulatory Visit (INDEPENDENT_AMBULATORY_CARE_PROVIDER_SITE_OTHER): Payer: 59 | Admitting: Ophthalmology

## 2019-10-23 ENCOUNTER — Encounter (INDEPENDENT_AMBULATORY_CARE_PROVIDER_SITE_OTHER): Payer: Self-pay | Admitting: Ophthalmology

## 2019-10-23 DIAGNOSIS — H35033 Hypertensive retinopathy, bilateral: Secondary | ICD-10-CM

## 2019-10-23 DIAGNOSIS — I10 Essential (primary) hypertension: Secondary | ICD-10-CM | POA: Diagnosis not present

## 2019-10-23 DIAGNOSIS — H43811 Vitreous degeneration, right eye: Secondary | ICD-10-CM

## 2019-10-23 DIAGNOSIS — H33323 Round hole, bilateral: Secondary | ICD-10-CM

## 2019-10-23 DIAGNOSIS — H25813 Combined forms of age-related cataract, bilateral: Secondary | ICD-10-CM | POA: Diagnosis not present

## 2019-10-23 DIAGNOSIS — H3581 Retinal edema: Secondary | ICD-10-CM | POA: Diagnosis not present

## 2019-10-23 MED ORDER — PREDNISOLONE ACETATE 1 % OP SUSP: 1.0000 [drp] | Freq: Four times a day (QID) | OPHTHALMIC | 0 refills | Status: AC

## 2019-10-24 MED ORDER — HYDROCODONE-ACETAMINOPHEN 10-325 MG PO TABS: 1.0000 | ORAL_TABLET | Freq: Two times a day (BID) | ORAL | 0 refills | Status: DC | PRN

## 2019-11-12 ENCOUNTER — Other Ambulatory Visit: Payer: Self-pay | Admitting: Internal Medicine

## 2019-11-21 ENCOUNTER — Encounter (INDEPENDENT_AMBULATORY_CARE_PROVIDER_SITE_OTHER): Payer: 59 | Admitting: Ophthalmology

## 2019-11-23 ENCOUNTER — Other Ambulatory Visit: Payer: Self-pay

## 2019-11-23 MED ORDER — HYDROCODONE-ACETAMINOPHEN 10-325 MG PO TABS: 1.0000 | ORAL_TABLET | Freq: Two times a day (BID) | ORAL | 0 refills | Status: DC | PRN

## 2019-11-23 NOTE — Telephone Encounter (Signed)
Refilled: 10/25/2019. Last OV: 08/15/2019 Next OV: not scheduled

## 2019-12-03 ENCOUNTER — Other Ambulatory Visit: Payer: Self-pay | Admitting: Internal Medicine

## 2019-12-04 ENCOUNTER — Encounter: Payer: Self-pay | Admitting: Internal Medicine

## 2019-12-04 ENCOUNTER — Other Ambulatory Visit: Payer: Self-pay

## 2019-12-04 ENCOUNTER — Ambulatory Visit (INDEPENDENT_AMBULATORY_CARE_PROVIDER_SITE_OTHER): Payer: 59 | Admitting: Internal Medicine

## 2019-12-04 DIAGNOSIS — M4317 Spondylolisthesis, lumbosacral region: Secondary | ICD-10-CM | POA: Diagnosis not present

## 2019-12-04 MED ORDER — HYDROCODONE-ACETAMINOPHEN 10-325 MG PO TABS: 1.0000 | ORAL_TABLET | Freq: Four times a day (QID) | ORAL | 0 refills | Status: DC | PRN

## 2019-12-04 NOTE — Assessment & Plan Note (Signed)
Pain aggravated by activities.  MRI reviewed from 2018.   Extension exercises demonstrated increased vicodin to #4 daily for one month follow up one month

## 2019-12-04 NOTE — Progress Notes (Signed)
Has not seen a change in pain control since starting the celebrex. Tramadol is not helping with the pain any longer as well.

## 2019-12-04 NOTE — Progress Notes (Signed)
Virtual Visit converted to   Telephone Note  This visit type was conducted due to national recommendations for restrictions regarding the COVID-19 pandemic (e.g. social distancing).  This format is felt to be most appropriate for this patient at this time.  All issues noted in this document were discussed and addressed.  No physical exam was performed (except for noted visual exam findings with Video Visits).   I connected with@ on 12/04/19 at  1:30 PM EST by a video enabled telemedicine application  and verified that I am speaking with the correct person using two identifiers. Location patient: home Location provider: work or home office Persons participating in the virtual visit: patient, provider  I discussed the limitations, risks, security and privacy concerns of performing an evaluation and management service by telephone and the availability of in person appointments. I also discussed with the patient that there may be a patient responsible charge related to this service. The patient expressed understanding and agreed to proceed.  Interactive audio and video telecommunications were attempted between this provider and patient, however failed, due to patient having technical difficulties OR patient did not have access to video capability.  We continued and completed visit with audio only.  Reason for visit: follow up on pain management   HPI:  63 yr old male with chronic pin secondary to remote traumatic injury to leg,  And lumbar spondylosis   With prior decompression in 2018  Last visit in Sept. vicodin was increased to bid,  And celebrex 100 mg bid  Also using tramadol in between but pain has been uncontrolled.     Refill history confirmed via [place] Controlled Substance databas, accessed by me today.. last refill of hydrocodone was dec 18 dose  10/325  #60    Last MRI Dec 2018:  No compressive stenosis  And previous decompression of L5-S1 stable. Mild degenerative changes noted.    Pain  has been aggravated by his daytime activities taking care of grandchildren.  Using 2nd vicodin at 12:30 pm   Back extension exercise described.   Plan:  Increase vicodin to #120/month to allow for q 6 hr dosing follow up one month .  back extension exercise 10 reps every 2 hours while awake   ROS: See pertinent positives and negatives per HPI.  Past Medical History:  Diagnosis Date  . Anxiety    takes Valium as needed  . Arthritis   . Chronic back pain    spondylolisthesis  . Depression    takes Zoloft daily  . Hyperlipidemia    takes Atorvastatin daily  . Hypertension    takes Micardis and AMlodipine daily  . Joint pain   . Joint swelling   . Peripheral edema    takes Lasix daily  . Urinary frequency    takes Rapaflo daily  . Weakness    numbness and tingling    Past Surgical History:  Procedure Laterality Date  . BACK SURGERY  04/2016  . COLONOSCOPY    . TIBIA FRACTURE SURGERY Bilateral 1989   multiple surgeries   . TIBIA HARDWARE REMOVAL  1989    Family History  Problem Relation Age of Onset  . Cancer Mother        lung Ca, tobacco abuse  . CAD Mother        MI at age 42  . Stroke Father   . Hypertension Father   . Heart disease Father        pacemaker, heart failure  . Kidney  disease Father   . Ulcerative colitis Father   . Cancer Sister 50       colon CA  . Cancer Other        colon CA at age 17  . Ulcerative colitis Brother     SOCIAL HX:  reports that he quit smoking about 5 years ago. His smoking use included cigarettes. He started smoking about 42 years ago. He smoked 0.00 packs per day for 34.00 years. His smokeless tobacco use includes chew. He reports that he does not drink alcohol or use drugs.   Current Outpatient Medications:  .  amLODipine (NORVASC) 10 MG tablet, TAKE 1 TABLET BY MOUTH DAILY, Disp: 90 tablet, Rfl: 1 .  atorvastatin (LIPITOR) 20 MG tablet, TAKE 1 TABLET BY MOUTH DAILY AT 6 PM., Disp: 90 tablet, Rfl: 1 .  celecoxib  (CELEBREX) 100 MG capsule, TAKE 1 CAPSULE BY MOUTH TWICE DAILY, Disp: 60 capsule, Rfl: 3 .  clopidogrel (PLAVIX) 75 MG tablet, TAKE 1 TABLET BY MOUTH DAILY, Disp: 90 tablet, Rfl: 3 .  DULoxetine (CYMBALTA) 30 MG capsule, TAKE 1 CAPSULE BY MOUTH TWICE DAILY, Disp: 180 capsule, Rfl: 1 .  furosemide (LASIX) 20 MG tablet, Take 1 tablet (20 mg total) by mouth daily as needed., Disp: 90 tablet, Rfl: 2 .  gabapentin (NEURONTIN) 100 MG capsule, TAKE 2 CAPSULES BY MOUTH 3 TIMES DAILY., Disp: 180 capsule, Rfl: 1 .  [START ON 12/06/2019] HYDROcodone-acetaminophen (NORCO) 10-325 MG tablet, Take 1 tablet by mouth every 6 (six) hours as needed., Disp: 120 tablet, Rfl: 0 .  pantoprazole (PROTONIX) 40 MG tablet, Take 1 tablet (40 mg total) by mouth daily., Disp: 30 tablet, Rfl: 3 .  tizanidine (ZANAFLEX) 6 MG capsule, TAKE 1 CAPSULE (6MG ) BY MOUTH 3 TIMES DAILY AS NEEDED FOR MUSCLE SPASMS., Disp: 90 capsule, Rfl: 1 .  valsartan-hydrochlorothiazide (DIOVAN-HCT) 80-12.5 MG tablet, TAKE 2 TABLETS BY MOUTH ONCE A DAY, Disp: 180 tablet, Rfl: 0  EXAM:   General impression: alert, cooperative and articulate.  No signs of being in distress  Lungs: speech is fluent sentence length suggests that patient is not short of breath and not punctuated by cough, sneezing or sniffing. [name]   Psych: affect normal.  speech is articulate and non pressured .  Denies suicidal thoughts   ASSESSMENT AND PLAN:  Discussed the following assessment and plan:  Spondylolisthesis at L5-S1 level  Spondylolisthesis at L5-S1 level Pain aggravated by activities.  MRI reviewed from 2018.   Extension exercises demonstrated increased vicodin to #4 daily for one month follow up one month     I discussed the assessment and treatment plan with the patient. The patient was provided an opportunity to ask questions and all were answered. The patient agreed with the plan and demonstrated an understanding of the instructions.   The patient was advised  to call back or seek an in-person evaluation if the symptoms worsen or if the condition fails to improve as anticipated.    I provided  25 minutes of non-face-to-face time during this encounter reviewing patient's current problems and past procedures/imaging studies, providing counseling on the above mentioned problems , and coordination  of care . [name], MD

## 2019-12-17 ENCOUNTER — Other Ambulatory Visit: Payer: Self-pay | Admitting: Internal Medicine

## 2020-01-01 ENCOUNTER — Ambulatory Visit: Payer: Self-pay | Admitting: Internal Medicine

## 2020-01-02 ENCOUNTER — Other Ambulatory Visit: Payer: Self-pay

## 2020-01-04 ENCOUNTER — Other Ambulatory Visit: Payer: Self-pay | Admitting: Internal Medicine

## 2020-01-04 NOTE — Telephone Encounter (Signed)
Patient will be out of hydrocodone tomorrow called and verified with patient. Last OV  12/04/19, last fill 12/06/19

## 2020-01-05 NOTE — Telephone Encounter (Signed)
It appears that Dr Derrel Nip refilled the medication.

## 2020-01-07 MED ORDER — HYDROCODONE-ACETAMINOPHEN 10-325 MG PO TABS: 1.0000 | ORAL_TABLET | Freq: Four times a day (QID) | ORAL | 0 refills | Status: DC | PRN

## 2020-01-09 ENCOUNTER — Ambulatory Visit (INDEPENDENT_AMBULATORY_CARE_PROVIDER_SITE_OTHER): Payer: No Typology Code available for payment source | Admitting: Internal Medicine

## 2020-01-09 ENCOUNTER — Encounter: Payer: Self-pay | Admitting: Internal Medicine

## 2020-01-09 ENCOUNTER — Other Ambulatory Visit: Payer: Self-pay

## 2020-01-09 DIAGNOSIS — I10 Essential (primary) hypertension: Secondary | ICD-10-CM | POA: Diagnosis not present

## 2020-01-09 DIAGNOSIS — Z6839 Body mass index (BMI) 39.0-39.9, adult: Secondary | ICD-10-CM

## 2020-01-09 DIAGNOSIS — M4317 Spondylolisthesis, lumbosacral region: Secondary | ICD-10-CM | POA: Diagnosis not present

## 2020-01-09 DIAGNOSIS — E6609 Other obesity due to excess calories: Secondary | ICD-10-CM | POA: Diagnosis not present

## 2020-01-09 NOTE — Assessment & Plan Note (Signed)
Pain no longer aggravated by activities.  MRI reviewed from 2018.   Extension exercises demonstrated  And reviewed,  Did not tolerate standing back extension .  Refilled  vicodin to #4 daily and continue alternative exercise.  follow up one month

## 2020-01-09 NOTE — Assessment & Plan Note (Signed)
I have congratulated him in reduction of   BMI and encouraged  Continued weight loss with goal of 10% of body weight over the next 6 months using a low glycemic index diet and regular exercise a minimum of 5 days per week.

## 2020-01-09 NOTE — Progress Notes (Signed)
Virtual Visit via  [email or web address]  This visit type was conducted due to national recommendations for restrictions regarding the COVID-19 pandemic (e.g. social distancing).  This format is felt to be most appropriate for this patient at this time.  All issues noted in this document were discussed and addressed.  No physical exam was performed (except for noted visual exam findings with Video Visits).   I connected with@ on 01/09/20 at  9:30 AM EST by a video enabled telemedicine application  and verified that I am speaking with the correct person using two identifiers. Location patient: home Location provider: work or home office Persons participating in the virtual visit: patient, provider  I discussed the limitations, risks, security and privacy concerns of performing an evaluation and management service by telephone and the availability of in person appointments. I also discussed with the patient that there may be a patient responsible charge related to this service. The patient expressed understanding and agreed to proceed.   Reason for visit: follow up  HPI:  64 yr old with chronic low back pain , prior surgeries, presents for one moth follow up on back pain obesity and hypertension. He was given extension exercises last month but states that the extension exercises aggravated his sciatica so he did not continue them  He has been trying a variation of the exercise for the past week but not consistently so is not sure it is helping .  His daytime activities are not aggravating his pain . Refill history confirmed via [place] Controlled Substance databas, accessed by me today.[name]  HTN: BP this morning was 148/87  .  Taking amlodipine and telmisartan/hc max doses  Obesity;  Wt climbed to 292 but now 250.  Plateaued.  KETO diet .  reviewed intake    ROS: See pertinent positives and negatives per HPI.  Past Medical History:  Diagnosis Date  . Anxiety    takes Valium as needed  . Arthritis   . Chronic  back pain    spondylolisthesis  . Depression    takes Zoloft daily  . Hyperlipidemia    takes Atorvastatin daily  . Hypertension    takes Micardis and AMlodipine daily  . Joint pain   . Joint swelling   . Peripheral edema    takes Lasix daily  . Urinary frequency    takes Rapaflo daily  . Weakness    numbness and tingling    Past Surgical History:  Procedure Laterality Date  . BACK SURGERY  04/2016  . COLONOSCOPY    . TIBIA FRACTURE SURGERY Bilateral 1989   multiple surgeries   . TIBIA HARDWARE REMOVAL  1989    Family History  Problem Relation Age of Onset  . Cancer Mother        lung Ca, tobacco abuse  . CAD Mother        MI at age 70  . Stroke Father   . Hypertension Father   . Heart disease Father        pacemaker, heart failure  . Kidney disease Father   . Ulcerative colitis Father   . Cancer Sister 63       colon CA  . Cancer Other        colon CA at age 78  . Ulcerative colitis Brother     SOCIAL HX:  reports that he quit smoking about 6 years ago. His smoking use included cigarettes. He started smoking about 42 years ago. He smoked 0.00 packs per day for  34.00 years. His smokeless tobacco use includes chew. He reports that he does not drink alcohol or use drugs.   Current Outpatient Medications:  .  amLODipine (NORVASC) 10 MG tablet, TAKE 1 TABLET BY MOUTH DAILY, Disp: 90 tablet, Rfl: 1 .  atorvastatin (LIPITOR) 20 MG tablet, TAKE 1 TABLET BY MOUTH DAILY AT 6 PM., Disp: 90 tablet, Rfl: 1 .  celecoxib (CELEBREX) 100 MG capsule, TAKE 1 CAPSULE BY MOUTH TWICE DAILY, Disp: 60 capsule, Rfl: 3 .  clopidogrel (PLAVIX) 75 MG tablet, TAKE 1 TABLET BY MOUTH DAILY, Disp: 90 tablet, Rfl: 3 .  DULoxetine (CYMBALTA) 30 MG capsule, TAKE 1 CAPSULE BY MOUTH TWICE DAILY, Disp: 180 capsule, Rfl: 1 .  furosemide (LASIX) 20 MG tablet, Take 1 tablet (20 mg total) by mouth daily as needed., Disp: 90 tablet, Rfl: 2 .  gabapentin (NEURONTIN) 100 MG capsule, TAKE 2 CAPSULES BY  MOUTH 3 TIMES DAILY., Disp: 180 capsule, Rfl: 1 .  HYDROcodone-acetaminophen (NORCO) 10-325 MG tablet, Take 1 tablet by mouth every 6 (six) hours as needed., Disp: 120 tablet, Rfl: 0 .  HYDROcodone-acetaminophen (NORCO) 10-325 MG tablet, TAKE 1 TABLET BY MOUTH EVERY 6 HOURS AS NEEDED. **FILL 12/06/19**, Disp: 120 tablet, Rfl: 0 .  pantoprazole (PROTONIX) 40 MG tablet, TAKE 1 TABLET BY MOUTH DAILY., Disp: 90 tablet, Rfl: 3 .  tizanidine (ZANAFLEX) 6 MG capsule, TAKE 1 CAPSULE (6MG ) BY MOUTH 3 TIMES DAILY AS NEEDED FOR MUSCLE SPASMS., Disp: 90 capsule, Rfl: 1 .  valsartan-hydrochlorothiazide (DIOVAN-HCT) 80-12.5 MG tablet, TAKE 2 TABLETS BY MOUTH ONCE A DAY, Disp: 180 tablet, Rfl: 0  EXAM:  VITALS per patient if applicable:  GENERAL: alert, oriented, appears well and in no acute distress  HEENT: atraumatic, conjunttiva clear, no obvious abnormalities on inspection of external nose and ears  NECK: normal movements of the head and neck  LUNGS: on inspection no signs of respiratory distress, breathing rate appears normal, no obvious gross SOB, gasping or wheezing  CV: no obvious cyanosis  MS: moves all visible extremities without noticeable abnormality  PSYCH/NEURO: pleasant and cooperative, no obvious depression or anxiety, speech and thought processing grossly intact  ASSESSMENT AND PLAN:  Discussed the following assessment and plan:  Essential hypertension  Class 2 obesity due to excess calories without serious comorbidity with body mass index (BMI) of 39.0 to 39.9 in adult  Spondylolisthesis at L5-S1 level  Hypertension Not at goal on max doses of amlodipine and hctz/telmisartan. Advised to watch salt intake,  check once more this week with pulse so med changes can be made.   Obesity I have congratulated him in reduction of   BMI and encouraged  Continued weight loss with goal of 10% of body weight over the next 6 months using a low glycemic index diet and regular exercise a  minimum of 5 days per week.    Spondylolisthesis at L5-S1 level Pain no longer aggravated by activities.  MRI reviewed from 2018.   Extension exercises demonstrated  And reviewed,  Did not tolerate standing back extension .  Refilled  vicodin to #4 daily and continue alternative exercise.  follow up one month     I discussed the assessment and treatment plan with the patient. The patient was provided an opportunity to ask questions and all were answered. The patient agreed with the plan and demonstrated an understanding of the instructions.   The patient was advised to call back or seek an in-person evaluation if the symptoms worsen or if the condition  fails to improve as anticipated.  I provided  30 minutes of non-face-to-face time during this encounter reviewing patient's current problems and past procedures/imaging studies, providing counseling on the above mentioned problems , and coordination  of care .  [name], MD

## 2020-01-09 NOTE — Assessment & Plan Note (Addendum)
Not at goal on max doses of amlodipine and hctz/telmisartan. Advised to watch salt intake,  check once more this week with pulse so med changes can be made.

## 2020-01-21 ENCOUNTER — Other Ambulatory Visit: Payer: Self-pay | Admitting: Internal Medicine

## 2020-02-02 ENCOUNTER — Other Ambulatory Visit: Payer: Self-pay

## 2020-02-02 MED ORDER — HYDROCODONE-ACETAMINOPHEN 10-325 MG PO TABS: ORAL_TABLET | ORAL | 0 refills | Status: DC

## 2020-02-02 MED ORDER — HYDROCODONE-ACETAMINOPHEN 10-325 MG PO TABS: 1.0000 | ORAL_TABLET | Freq: Four times a day (QID) | ORAL | 0 refills | Status: DC | PRN

## 2020-02-04 ENCOUNTER — Other Ambulatory Visit: Payer: Self-pay | Admitting: Internal Medicine

## 2020-02-04 MED ORDER — CLOPIDOGREL BISULFATE 75 MG PO TABS: 75.0000 mg | ORAL_TABLET | Freq: Every day | ORAL | 1 refills | Status: DC

## 2020-02-06 ENCOUNTER — Encounter: Payer: Self-pay | Admitting: Internal Medicine

## 2020-02-06 ENCOUNTER — Other Ambulatory Visit: Payer: Self-pay

## 2020-02-06 ENCOUNTER — Telehealth (INDEPENDENT_AMBULATORY_CARE_PROVIDER_SITE_OTHER): Payer: No Typology Code available for payment source | Admitting: Internal Medicine

## 2020-02-06 VITALS — BP 163/92 | Ht 70.0 in | Wt 258.0 lb

## 2020-02-06 DIAGNOSIS — D72829 Elevated white blood cell count, unspecified: Secondary | ICD-10-CM

## 2020-02-06 DIAGNOSIS — M5416 Radiculopathy, lumbar region: Secondary | ICD-10-CM | POA: Diagnosis not present

## 2020-02-06 DIAGNOSIS — E785 Hyperlipidemia, unspecified: Secondary | ICD-10-CM

## 2020-02-06 DIAGNOSIS — I6503 Occlusion and stenosis of bilateral vertebral arteries: Secondary | ICD-10-CM

## 2020-02-06 DIAGNOSIS — I10 Essential (primary) hypertension: Secondary | ICD-10-CM | POA: Diagnosis not present

## 2020-02-06 DIAGNOSIS — M4317 Spondylolisthesis, lumbosacral region: Secondary | ICD-10-CM

## 2020-02-06 DIAGNOSIS — K760 Fatty (change of) liver, not elsewhere classified: Secondary | ICD-10-CM | POA: Diagnosis not present

## 2020-02-06 DIAGNOSIS — Z125 Encounter for screening for malignant neoplasm of prostate: Secondary | ICD-10-CM

## 2020-02-06 MED ORDER — GABAPENTIN 300 MG PO CAPS: 300.0000 mg | ORAL_CAPSULE | Freq: Three times a day (TID) | ORAL | 4 refills | Status: DC

## 2020-02-06 MED ORDER — HYDROCODONE-ACETAMINOPHEN 10-325 MG PO TABS: 1.0000 | ORAL_TABLET | Freq: Four times a day (QID) | ORAL | 0 refills | Status: DC | PRN

## 2020-02-06 NOTE — Patient Instructions (Signed)
Stop the Celebrex  Continue blood pressure medications as previously taking and check BP daily for one week  Increase gabapentin dose to 300 mg three times daily using the NEW prescription sent in  Save the 100 mg capsules for the next dose titration (we may end up increasing dose to 400 mg)   MRI Lumbar spine ordered

## 2020-02-06 NOTE — Progress Notes (Signed)
Virtual Visit via [place]  This visit type was conducted due to national recommendations for restrictions regarding the COVID-19 pandemic (e.g. social distancing).  This format is felt to be most appropriate for this patient at this time.  All issues noted in this document were discussed and addressed.  No physical exam was performed (except for noted visual exam findings with Video Visits).   I connected with@ on 02/06/20 at  9:30 AM EST by a video enabled telemedicine application  and verified that I am speaking with the correct person using two identifiers. Location patient: home Location provider: work or home office Persons participating in the virtual visit: patient, provider  I discussed the limitations, risks, security and privacy concerns of performing an evaluation and management service by telephone and the availability of in person appointments. I also discussed with the patient that there may be a patient responsible charge related to this service. The patient expressed understanding and agreed to proceed.  Reason for visit: follow up on hypertension and chronic back and leg pain managed with opioid therapy  HPI:  HTN:  Home readings elevated.  Taking amlodipine 10 mg at night and valsartan 320 mg in the morning.  Also taking celebrex bid, feels no difference in pain management with or without it so ok with stopping it .   Back pain :  Worse.  Tolerable when sitting. , but standing and walking cause radicular pain to both legs,  L > R,  Not relieved with meds.  Has been unable to exercise despite taking Hydrocodone 10/325 every 6 hours,  gabapentin 200 mg every 8 hours and celebrex.    ROS: See pertinent positives and negatives per HPI.  Past Medical History:  Diagnosis Date  . Anxiety    takes Valium as needed  . Arthritis   . Chronic back pain    spondylolisthesis  . Depression    takes Zoloft daily  . Hyperlipidemia    takes Atorvastatin daily  . Hypertension     takes Micardis and AMlodipine daily  . Joint pain   . Joint swelling   . Peripheral edema    takes Lasix daily  . Urinary frequency    takes Rapaflo daily  . Weakness    numbness and tingling    Past Surgical History:  Procedure Laterality Date  . BACK SURGERY  04/2016  . COLONOSCOPY    . TIBIA FRACTURE SURGERY Bilateral 1989   multiple surgeries   . TIBIA HARDWARE REMOVAL  1989    Family History  Problem Relation Age of Onset  . Cancer Mother        lung Ca, tobacco abuse  . CAD Mother        MI at age 78  . Stroke Father   . Hypertension Father   . Heart disease Father        pacemaker, heart failure  . Kidney disease Father   . Ulcerative colitis Father   . Cancer Sister 18       colon CA  . Cancer Other        colon CA at age 4  . Ulcerative colitis Brother     SOCIAL HX:  reports that he quit smoking about 6 years ago. His smoking use included cigarettes. He started smoking about 42 years ago. He smoked 0.00 packs per day for 34.00 years. His smokeless tobacco use includes chew. He reports that he does not drink alcohol or use drugs.   Current Outpatient Medications:  .  amLODipine (NORVASC) 10 MG tablet, TAKE 1 TABLET BY MOUTH DAILY, Disp: 90 tablet, Rfl: 1 .  atorvastatin (LIPITOR) 20 MG tablet, TAKE 1 TABLET BY MOUTH DAILY AT 6 PM., Disp: 90 tablet, Rfl: 1 .  clopidogrel (PLAVIX) 75 MG tablet, Take 1 tablet (75 mg total) by mouth daily., Disp: 90 tablet, Rfl: 1 .  DULoxetine (CYMBALTA) 30 MG capsule, TAKE 1 CAPSULE BY MOUTH TWICE DAILY, Disp: 180 capsule, Rfl: 1 .  furosemide (LASIX) 20 MG tablet, Take 1 tablet (20 mg total) by mouth daily as needed., Disp: 90 tablet, Rfl: 2 .  gabapentin (NEURONTIN) 300 MG capsule, Take 1 capsule (300 mg total) by mouth 3 (three) times daily., Disp: 180 capsule, Rfl: 4 .  [START ON 03/06/2020] HYDROcodone-acetaminophen (NORCO) 10-325 MG tablet, TAKE 1 TABLET BY MOUTH EVERY 6 HOURS AS NEEDED., Disp: 120 tablet, Rfl: 0 .   [START ON 04/05/2020] HYDROcodone-acetaminophen (NORCO) 10-325 MG tablet, Take 1 tablet by mouth every 6 (six) hours as needed., Disp: 120 tablet, Rfl: 0 .  pantoprazole (PROTONIX) 40 MG tablet, TAKE 1 TABLET BY MOUTH DAILY., Disp: 90 tablet, Rfl: 3 .  tizanidine (ZANAFLEX) 6 MG capsule, TAKE 1 CAPSULE (6MG ) BY MOUTH 3 TIMES DAILY AS NEEDED FOR MUSCLE SPASMS., Disp: 90 capsule, Rfl: 1 .  valsartan-hydrochlorothiazide (DIOVAN-HCT) 80-12.5 MG tablet, TAKE 2 TABLETS BY MOUTH ONCE A DAY, Disp: 180 tablet, Rfl: 0  EXAM:  VITALS per patient if applicable:  GENERAL: alert, oriented, appears well and in no acute distress  HEENT: atraumatic, conjunttiva clear, no obvious abnormalities on inspection of external nose and ears  NECK: normal movements of the head and neck  LUNGS: on inspection no signs of respiratory distress, breathing rate appears normal, no obvious gross SOB, gasping or wheezing  CV: no obvious cyanosis  MS: moves all visible extremities without noticeable abnormality  PSYCH/NEURO: pleasant and cooperative, no obvious depression or anxiety, speech and thought processing grossly intact  ASSESSMENT AND PLAN:  Discussed the following assessment and plan:  Spondylolisthesis at L5-S1 level - Plan: MR Lumbar Spine Wo Contrast  Lumbar radicular pain - Plan: MR Lumbar Spine Wo Contrast  Essential hypertension - Plan: Microalbumin / creatinine urine ratio  Fatty liver - Plan: Comprehensive metabolic panel  Hyperlipidemia LDL goal <100 - Plan: Lipid panel  Prostate cancer screening - Plan: PSA  Leukocytosis, unspecified type - Plan: CBC with Differential/Platelet  Stenosis of both vertebral arteries  Hypertension he reports compliance with medication regimen  but has an elevated reading today at home and may be due to celebrex .  Advised to stop NSIAD .  Discussed goal of 120/70  (130/80 for patients over 70)  to preserve renal function.  he has been asked to check his   BP  at  home and  submit readings for evaluation. Renal function, electrolytes and screen for proteinuria have been  normal . But are due for repeat assessment   Lab Results  Component Value Date   CREATININE 1.09 08/24/2019   Lab Results  Component Value Date   NA 142 08/24/2019   K 4.0 08/24/2019   CL 102 08/24/2019   CO2 30 08/24/2019   Lab Results  Component Value Date   MICROALBUR <0.7 11/11/2015   MICROALBUR 4.3 (H) 02/14/2015       Spondylolisthesis at L5-S1 level S/p decompressive laminectomy , foraminotomies, and fusion in 2017.  Currently having radicular Pain aggravated by activities and extension exercises.  Needs repeat MRI , last one 2018 .  Increasing gabapentin dose to 300 mg tid   Stenosis of both vertebral arteries Non occlusive,  Done during workup for TIA.  Continue Plavix, ASA and statin    Repeat lipids are due   Lab Results  Component Value Date   CHOL 128 08/24/2019   HDL 35 (L) 08/24/2019   LDLCALC 66 08/24/2019   LDLDIRECT 142.8 10/23/2012   TRIG 204 (H) 08/24/2019   CHOLHDL 3.7 08/24/2019     I discussed the assessment and treatment plan with the patient. The patient was provided an opportunity to ask questions and all were answered. The patient agreed with the plan and demonstrated an understanding of the instructions.   The patient was advised to call back or seek an in-person evaluation if the symptoms worsen or if the condition fails to improve as anticipated.  I provided  30 minutes of non-face-to-face time during this encounter reviewing patient's current problems and past surgeries, labs and imaging studies, providing counseling on the above mentioned problems , and coordination  of care . [name], MD

## 2020-02-08 NOTE — Assessment & Plan Note (Signed)
he reports compliance with medication regimen  but has an elevated reading today at home and may be due to celebrex .  Advised to stop NSIAD .  Discussed goal of 120/70  (130/80 for patients over 70)  to preserve renal function.  he has been asked to check his   BP  at home and  submit readings for evaluation. Renal function, electrolytes and screen for proteinuria have been  normal . But are due for repeat assessment   Lab Results  Component Value Date   CREATININE 1.09 08/24/2019   Lab Results  Component Value Date   NA 142 08/24/2019   K 4.0 08/24/2019   CL 102 08/24/2019   CO2 30 08/24/2019   Lab Results  Component Value Date   MICROALBUR <0.7 11/11/2015   MICROALBUR 4.3 (H) 02/14/2015

## 2020-02-08 NOTE — Assessment & Plan Note (Addendum)
S/p decompressive laminectomy , foraminotomies, and fusion in 2017.  Currently having radicular Pain aggravated by activities and extension exercises.  Needs repeat MRI , last one 2018 . Increasing gabapentin dose to 300 mg tid

## 2020-02-08 NOTE — Assessment & Plan Note (Signed)
Non occlusive,  Done during workup for TIA.  Continue Plavix, ASA and statin    Repeat lipids are due   Lab Results  Component Value Date   CHOL 128 08/24/2019   HDL 35 (L) 08/24/2019   LDLCALC 66 08/24/2019   LDLDIRECT 142.8 10/23/2012   TRIG 204 (H) 08/24/2019   CHOLHDL 3.7 08/24/2019

## 2020-02-20 ENCOUNTER — Ambulatory Visit
Admission: RE | Admit: 2020-02-20 | Discharge: 2020-02-20 | Disposition: A | Discharge status: Home / Self Care | Payer: No Typology Code available for payment source | Source: Ambulatory Visit | Attending: Internal Medicine | Admitting: Internal Medicine

## 2020-02-20 ENCOUNTER — Other Ambulatory Visit: Payer: Self-pay

## 2020-02-20 DIAGNOSIS — M4317 Spondylolisthesis, lumbosacral region: Secondary | ICD-10-CM | POA: Insufficient documentation

## 2020-02-20 DIAGNOSIS — M5416 Radiculopathy, lumbar region: Secondary | ICD-10-CM | POA: Diagnosis present

## 2020-02-21 DIAGNOSIS — M4317 Spondylolisthesis, lumbosacral region: Secondary | ICD-10-CM

## 2020-02-22 ENCOUNTER — Ambulatory Visit: Payer: No Typology Code available for payment source | Attending: Internal Medicine

## 2020-02-22 DIAGNOSIS — Z23 Encounter for immunization: Secondary | ICD-10-CM

## 2020-02-22 NOTE — Progress Notes (Signed)
   Covid-19 Vaccination Clinic  Name:  Tyrone Mcdowell    MRN: [id number] DOB: 01-06-1956  02/22/2020  Tyrone Mcdowell was observed post Covid-19 immunization for 15 minutes without incident. He was provided with Vaccine Information Sheet and instruction to access the V-Safe system.   Tyrone Mcdowell was instructed to call 911 with any severe reactions post vaccine: [name] Difficulty breathing  . Swelling of face and throat  . A fast heartbeat  . A bad rash all over body  . Dizziness and weakness   Immunizations Administered    Name Date Dose VIS Date Route   Pfizer COVID-19 Vaccine 02/22/2020  9:45 AM 0.3 mL 11/16/2019 Intramuscular   Manufacturer: [place]   Lot: [id number]   [place]: [id number]

## 2020-03-18 ENCOUNTER — Ambulatory Visit: Payer: No Typology Code available for payment source | Attending: Internal Medicine

## 2020-03-18 DIAGNOSIS — Z23 Encounter for immunization: Secondary | ICD-10-CM

## 2020-03-18 NOTE — Progress Notes (Signed)
   Covid-19 Vaccination Clinic  Name:  Tyrone Mcdowell    MRN: [id number] DOB: Oct 16, 1956  03/18/2020  [name] was observed post Covid-19 immunization for 15 minutes without incident. He was provided with Vaccine Information Sheet and instruction to access the V-Safe system.   [name] was instructed to call 911 with any severe reactions post vaccine: [name] Difficulty breathing  . Swelling of face and throat  . A fast heartbeat  . A bad rash all over body  . Dizziness and weakness   Immunizations Administered    Name Date Dose VIS Date Route   Pfizer COVID-19 Vaccine 03/18/2020 11:19 AM 0.3 mL 11/16/2019 Intramuscular   Manufacturer: [place]   Lot: [id number]   [place]: [id number]

## 2020-04-30 ENCOUNTER — Other Ambulatory Visit: Payer: Self-pay

## 2020-05-01 ENCOUNTER — Other Ambulatory Visit: Payer: Self-pay | Admitting: Internal Medicine

## 2020-05-01 MED ORDER — VALSARTAN-HYDROCHLOROTHIAZIDE 80-12.5 MG PO TABS: 2.0000 | ORAL_TABLET | Freq: Every day | ORAL | 0 refills | Status: DC

## 2020-05-01 MED ORDER — TIZANIDINE HCL 6 MG PO CAPS: ORAL_CAPSULE | ORAL | 1 refills | Status: DC

## 2020-05-01 MED ORDER — HYDROCODONE-ACETAMINOPHEN 10-325 MG PO TABS: ORAL_TABLET | ORAL | 0 refills | Status: DC

## 2020-05-01 NOTE — Telephone Encounter (Signed)
Refill request for norco, last seen 02-06-20, last filled 04-05-20.  Please advise.

## 2020-05-14 NOTE — Telephone Encounter (Signed)
Called pt to schedule him an appt for today and pt stated that he is feeling better this morning so he did not want to schedule an appt at this time.

## 2020-06-04 MED ORDER — HYDROCODONE-ACETAMINOPHEN 10-325 MG PO TABS: ORAL_TABLET | ORAL | 0 refills | Status: DC

## 2020-06-11 ENCOUNTER — Other Ambulatory Visit: Payer: Self-pay

## 2020-06-11 ENCOUNTER — Ambulatory Visit (INDEPENDENT_AMBULATORY_CARE_PROVIDER_SITE_OTHER): Payer: No Typology Code available for payment source | Admitting: Internal Medicine

## 2020-06-11 ENCOUNTER — Encounter: Payer: Self-pay | Admitting: Internal Medicine

## 2020-06-11 DIAGNOSIS — D72829 Elevated white blood cell count, unspecified: Secondary | ICD-10-CM | POA: Diagnosis not present

## 2020-06-11 DIAGNOSIS — E1159 Type 2 diabetes mellitus with other circulatory complications: Secondary | ICD-10-CM

## 2020-06-11 DIAGNOSIS — I10 Essential (primary) hypertension: Secondary | ICD-10-CM

## 2020-06-11 DIAGNOSIS — Z125 Encounter for screening for malignant neoplasm of prostate: Secondary | ICD-10-CM

## 2020-06-11 DIAGNOSIS — E669 Obesity, unspecified: Secondary | ICD-10-CM

## 2020-06-11 DIAGNOSIS — E1169 Type 2 diabetes mellitus with other specified complication: Secondary | ICD-10-CM

## 2020-06-11 DIAGNOSIS — M79605 Pain in left leg: Secondary | ICD-10-CM

## 2020-06-11 DIAGNOSIS — M79604 Pain in right leg: Secondary | ICD-10-CM

## 2020-06-11 DIAGNOSIS — E6609 Other obesity due to excess calories: Secondary | ICD-10-CM

## 2020-06-11 DIAGNOSIS — E785 Hyperlipidemia, unspecified: Secondary | ICD-10-CM | POA: Diagnosis not present

## 2020-06-11 DIAGNOSIS — K760 Fatty (change of) liver, not elsewhere classified: Secondary | ICD-10-CM | POA: Diagnosis not present

## 2020-06-11 DIAGNOSIS — G8929 Other chronic pain: Secondary | ICD-10-CM

## 2020-06-11 DIAGNOSIS — Z6839 Body mass index (BMI) 39.0-39.9, adult: Secondary | ICD-10-CM

## 2020-06-11 DIAGNOSIS — M4317 Spondylolisthesis, lumbosacral region: Secondary | ICD-10-CM

## 2020-06-11 NOTE — Progress Notes (Signed)
Subjective:  Patient ID: Tyrone Mcdowell, male    DOB: December 09, 1955  Age: 64 y.o. MRN: [id number]  CC: Diagnoses of Essential hypertension, Leukocytosis, unspecified type, Prostate cancer screening, Hyperlipidemia LDL goal <100, Fatty liver, Chronic pain of both lower extremities, Spondylolisthesis at L5-S1 level, Class 2 obesity due to excess calories without serious comorbidity with body mass index (BMI) of 39.0 to 39.9 in adult, and Obesity, diabetes, and hypertension syndrome ([place]) were pertinent to this visit.  HPI Mcdowell Tyrone presents for medication refill and follow up on hypertension and hyperlipidemia  Had a covid type illness that lasted 2.5 days about 2 weeks ago.   Was vaccinated 2 months ago.  No household members affected.   Has been getting ESI in lumbar spine,  No lasting benefit , had a transient benefit  For 2-3 days from the first injection. Awaiting 3rd injection.  Pain  radiates into both hips after walking 100 feet on hard surfaces  . Has chronic leg pain due to prior leg injuries . Develops numbness in both legs after standing for a few hours.    Hypertension: patient checks blood pressure twice weekly at home.  Readings have been for the most part < 140/80 at rest . Patient is following a reduce salt diet most days and is taking medications as prescribed   Outpatient Medications Prior to Visit  Medication Sig Dispense Refill  . amLODipine (NORVASC) 10 MG tablet TAKE 1 TABLET BY MOUTH DAILY 90 tablet 1  . atorvastatin (LIPITOR) 20 MG tablet TAKE 1 TABLET BY MOUTH DAILY AT 6 PM. 90 tablet 1  . clopidogrel (PLAVIX) 75 MG tablet Take 1 tablet (75 mg total) by mouth daily. 90 tablet 1  . DULoxetine (CYMBALTA) 30 MG capsule TAKE 1 CAPSULE BY MOUTH TWICE DAILY 180 capsule 1  . furosemide (LASIX) 20 MG tablet Take 1 tablet (20 mg total) by mouth daily as needed. 90 tablet 2  . gabapentin (NEURONTIN) 300 MG capsule Take 1 capsule (300 mg total) by mouth 3 (three) times  daily. 180 capsule 4  . pantoprazole (PROTONIX) 40 MG tablet TAKE 1 TABLET BY MOUTH DAILY. 90 tablet 3  . tizanidine (ZANAFLEX) 6 MG capsule TAKE 1 CAPSULE (6MG ) BY MOUTH 3 TIMES DAILY AS NEEDED FOR MUSCLE SPASMS. 90 capsule 1  . valsartan-hydrochlorothiazide (DIOVAN-HCT) 80-12.5 MG tablet Take 2 tablets by mouth daily. 180 tablet 0  . HYDROcodone-acetaminophen (NORCO) 10-325 MG tablet Take 1 tablet by mouth every 6 (six) hours as needed. 120 tablet 0  . HYDROcodone-acetaminophen (NORCO) 10-325 MG tablet TAKE 1 TABLET BY MOUTH EVERY 6 HOURS AS NEEDED. 120 tablet 0   No facility-administered medications prior to visit.    Review of Systems;  Patient denies headache, fevers, malaise, unintentional weight loss, skin rash, eye pain, sinus congestion and sinus pain, sore throat, dysphagia,  hemoptysis , cough, dyspnea, wheezing, chest pain, palpitations, orthopnea, edema, abdominal pain, nausea, melena, diarrhea, constipation, flank pain, dysuria, hematuria, urinary  Frequency, nocturia, numbness, tingling, seizures,  Focal weakness, Loss of consciousness,  Tremor, insomnia, depression, anxiety, and suicidal ideation.      Objective:  BP 118/80 (BP Location: Left Arm, Patient Position: Sitting, Cuff Size: Large)   Pulse 70   Temp 98 F (36.7 C) (Temporal)   Resp 16   Ht 5\' 10"  (1.778 m)   Wt 266 lb 12.8 oz (121 kg)   SpO2 97%   BMI 38.28 kg/m   BP Readings from Last 3 Encounters:  06/11/20  118/80  02/06/20 (!) 163/92  01/09/20 (!) 149/87    Wt Readings from Last 3 Encounters:  06/11/20 266 lb 12.8 oz (121 kg)  02/06/20 258 lb (117 kg)  01/09/20 252 lb (114.3 kg)    General appearance: alert, cooperative and appears stated age Ears: normal TM's and external ear canals both ears Throat: lips, mucosa, and tongue normal; teeth and gums normal Neck: no adenopathy, no carotid bruit, supple, symmetrical, trachea midline and thyroid not enlarged, symmetric, no  tenderness/mass/nodules Back: symmetric, no curvature. ROM normal. No CVA tenderness. Lungs: clear to auscultation bilaterally Heart: regular rate and rhythm, S1, S2 normal, no murmur, click, rub or gallop Abdomen: soft, non-tender; bowel sounds normal; no masses,  no organomegaly Pulses: 2+ and symmetric Skin: Skin color, texture, turgor normal. No rashes or lesions Lymph nodes: Cervical, supraclavicular, and axillary nodes normal.  Lab Results  Component Value Date   HGBA1C 5.6 06/12/2020   HGBA1C 5.5 05/09/2018   HGBA1C 5.9 11/07/2017    Lab Results  Component Value Date   CREATININE 0.97 06/11/2020   CREATININE 1.09 08/24/2019   CREATININE 0.86 02/05/2019    Lab Results  Component Value Date   WBC 6.2 06/11/2020   HGB 13.1 06/11/2020   HCT 39.3 06/11/2020   PLT 240.0 06/11/2020   GLUCOSE 127 (H) 06/11/2020   CHOL 134 06/11/2020   TRIG 170.0 (H) 06/11/2020   HDL 41.00 06/11/2020   LDLDIRECT 142.8 10/23/2012   LDLCALC 59 06/11/2020   ALT 56 (H) 06/11/2020   AST 24 06/11/2020   NA 141 06/11/2020   K 3.6 06/11/2020   CL 101 06/11/2020   CREATININE 0.97 06/11/2020   BUN 19 06/11/2020   CO2 31 06/11/2020   TSH 1.21 11/07/2017   PSA 0.12 06/11/2020   INR 1.07 05/08/2018   HGBA1C 5.6 06/12/2020   MICROALBUR 3.8 (H) 06/11/2020    MR Lumbar Spine Wo Contrast  Result Date: 02/20/2020 CLINICAL DATA:  Low back pain with left-sided radiculopathy EXAM: MRI LUMBAR SPINE WITHOUT CONTRAST TECHNIQUE: Multiplanar, multisequence MR imaging of the lumbar spine was performed. No intravenous contrast was administered. COMPARISON:  11/09/2017 FINDINGS: Segmentation:  Standard. Alignment: 6 mm anterolisthesis L5 on S1. 2 mm retrolisthesis L4 on L5. Vertebrae: No fracture, evidence of discitis, or bone lesion. Postsurgical changes from posterior and interbody fusion at L5-S1 with posterior decompression. Conus medullaris and cauda equina: Conus extends to the L1-2 level. Conus and cauda  equina appear normal. Paraspinal and other soft tissues: Negative. Disc levels: T12-L1: No significant disc protrusion, foraminal stenosis, or canal stenosis. L1-L2: No significant disc protrusion, foraminal stenosis, or canal stenosis. L2-L3: No significant disc protrusion, foraminal stenosis, or canal stenosis. L3-L4: No significant disc protrusion, foraminal stenosis, or canal stenosis. L4-L5: Mild diffuse disc bulge and mild bilateral facet arthrosis. Mild left foraminal stenosis without evidence of neural impingement. No right foraminal or canal stenosis. Slight interval progression from prior. L5-S1: Prior fusion and posterior decompression. Anterolisthesis. Canal is widely patent. Suspect mild right greater than left foraminal stenosis, evaluation slightly degraded by susceptibility artifact from adjacent hardware. No appreciable interval change from prior. IMPRESSION: 1. Postsurgical changes related to L5-S1 posterior and interbody fusion with posterior decompression. Canal remains widely patent without stenosis. Suspect mild right greater than left foraminal stenosis at this level, slightly degraded by artifact. No appreciable interval change. 2. Mild left foraminal stenosis at L4-5, slightly progressed from prior. Electronically Signed   By: Davina Poke D.O.   On: 02/20/2020 14:39  Assessment & Plan:   Problem List Items Addressed This Visit      Unprioritized   Hypertension   Chronic leg pain    Secondary to traumatic injury, remote, and lumbar spondylosis with history of decompression in 2018 .  Pain has become more problematic in the last few months and hhe has been receiving ESI with little benefit .  Continue vicodin up to 4 times daily,  cymbalta and  celebrex 100 mg bid.   Refill history confirmed via [place] Controlled Substance database, accessed by me today..      Relevant Medications   HYDROcodone-acetaminophen (NORCO) 10-325 MG tablet (Start on 08/03/2020)    HYDROcodone-acetaminophen (NORCO) 10-325 MG tablet (Start on 07/04/2020)   HYDROcodone-acetaminophen ([place]) 10-325 MG tablet (Start on 09/02/2020)   Hyperlipidemia LDL goal <100   Morbid obesity ([place])    Complicated by fatty liver,  Hypertension. I have strongly advised him to lose weight with a goal of 10% of body weight over the next 6 months using a low glycemic index diet and regular exercise a minimum of 5 days per week.        Spondylolisthesis at L5-S1 level    S/p decompressive laminectomy , foraminotomies, and fusion in 2017.  Currently having radicular Pain aggravated by activities and limiting activity .  He has received 2 ESI and is awaiting one more procedure.  Refilling hydrocodone       Fatty liver   RESOLVED: Obesity, diabetes, and hypertension syndrome ([place])    Other Visit Diagnoses    Leukocytosis, unspecified type       Prostate cancer screening          I am having Verlin Dike maintain his furosemide, atorvastatin, pantoprazole, DULoxetine, amLODipine, clopidogrel, gabapentin, valsartan-hydrochlorothiazide, tizanidine, HYDROcodone-acetaminophen, HYDROcodone-acetaminophen, and HYDROcodone-acetaminophen.  Meds ordered this encounter  Medications  . HYDROcodone-acetaminophen (NORCO) 10-325 MG tablet    Sig: TAKE 1 TABLET BY MOUTH EVERY 6 HOURS AS NEEDED.    Dispense:  120 tablet    Refill:  0  . HYDROcodone-acetaminophen (NORCO) 10-325 MG tablet    Sig: Take 1 tablet by mouth every 6 (six) hours as needed.    Dispense:  120 tablet    Refill:  0  . HYDROcodone-acetaminophen (NORCO) 10-325 MG tablet    Sig: Take 1 tablet by mouth every 6 (six) hours as needed.    Dispense:  120 tablet    Refill:  0    Medications Discontinued During This Encounter  Medication Reason  . HYDROcodone-acetaminophen (NORCO) 10-325 MG tablet Reorder  . HYDROcodone-acetaminophen (NORCO) 10-325 MG tablet Reorder    Follow-up: No follow-ups on file.   [name], MD

## 2020-06-11 NOTE — Patient Instructions (Signed)
I want you to work on the [place] [facility] [place] for you and Maudie Mercury . MY patients have been VERY happy with the results,  And their cholesterol and diabetes control improves   Refilling meds ,  See you in 3 months

## 2020-06-12 ENCOUNTER — Other Ambulatory Visit (INDEPENDENT_AMBULATORY_CARE_PROVIDER_SITE_OTHER): Payer: No Typology Code available for payment source

## 2020-06-12 ENCOUNTER — Telehealth: Payer: Self-pay | Admitting: Internal Medicine

## 2020-06-12 DIAGNOSIS — R7301 Impaired fasting glucose: Secondary | ICD-10-CM

## 2020-06-12 LAB — COMPREHENSIVE METABOLIC PANEL
ALT: 56 U/L — ABNORMAL HIGH (ref 0–53)
AST: 24 U/L (ref 0–37)
Albumin: 4.3 g/dL (ref 3.5–5.2)
Alkaline Phosphatase: 104 U/L (ref 39–117)
BUN: 19 mg/dL (ref 6–23)
CO2: 31 mEq/L (ref 19–32)
Calcium: 9.6 mg/dL (ref 8.4–10.5)
Chloride: 101 mEq/L (ref 96–112)
Creatinine, Ser: 0.97 mg/dL (ref 0.40–1.50)
GFR: 78.5 mL/min (ref >60.00)
Glucose, Bld: 127 mg/dL — ABNORMAL HIGH (ref 70–99)
Potassium: 3.6 mEq/L (ref 3.5–5.1)
Sodium: 141 mEq/L (ref 135–145)
Total Bilirubin: 0.2 mg/dL (ref 0.2–1.2)
Total Protein: 6.6 g/dL (ref 6.0–8.3)

## 2020-06-12 LAB — CBC WITH DIFFERENTIAL/PLATELET
Basophils Absolute: 0 10*3/uL (ref 0.0–0.1)
Basophils Relative: 0.5 % (ref 0.0–3.0)
Eosinophils Absolute: 0.1 10*3/uL (ref 0.0–0.7)
Eosinophils Relative: 2.3 % (ref 0.0–5.0)
HCT: 39.3 % (ref 39.0–52.0)
Hemoglobin: 13.1 g/dL (ref 13.0–17.0)
Lymphocytes Relative: 26.8 % (ref 12.0–46.0)
Lymphs Abs: 1.7 10*3/uL (ref 0.7–4.0)
MCHC: 33.3 g/dL (ref 30.0–36.0)
MCV: 93 fl (ref 78.0–100.0)
Monocytes Absolute: 0.5 10*3/uL (ref 0.1–1.0)
Monocytes Relative: 8 % (ref 3.0–12.0)
Neutro Abs: 3.9 10*3/uL (ref 1.4–7.7)
Neutrophils Relative %: 62.4 % (ref 43.0–77.0)
Platelets: 240 10*3/uL (ref 150.0–400.0)
RBC: 4.23 Mil/uL (ref 4.22–5.81)
RDW: 13.4 % (ref 11.5–15.5)
WBC: 6.2 10*3/uL (ref 4.0–10.5)

## 2020-06-12 LAB — LIPID PANEL
Cholesterol: 134 mg/dL (ref 0–200)
HDL: 41 mg/dL (ref >39.00)
LDL Cholesterol: 59 mg/dL (ref 0–99)
NonHDL: 93.34
Total CHOL/HDL Ratio: 3
Triglycerides: 170 mg/dL — ABNORMAL HIGH (ref 0.0–149.0)
VLDL: 34 mg/dL (ref 0.0–40.0)

## 2020-06-12 LAB — MICROALBUMIN / CREATININE URINE RATIO
Creatinine,U: 143.8 mg/dL
Microalb Creat Ratio: 2.7 mg/g (ref 0.0–30.0)
Microalb, Ur: 3.8 mg/dL — ABNORMAL HIGH (ref 0.0–1.9)

## 2020-06-12 LAB — PSA: PSA: 0.12 ng/mL (ref 0.10–4.00)

## 2020-06-12 LAB — HEMOGLOBIN A1C: Hgb A1c MFr Bld: 5.6 % (ref 4.6–6.5)

## 2020-06-13 ENCOUNTER — Telehealth: Payer: Self-pay | Admitting: Internal Medicine

## 2020-06-13 DIAGNOSIS — I152 Hypertension secondary to endocrine disorders: Secondary | ICD-10-CM | POA: Insufficient documentation

## 2020-06-13 DIAGNOSIS — E119 Type 2 diabetes mellitus without complications: Secondary | ICD-10-CM | POA: Insufficient documentation

## 2020-06-13 DIAGNOSIS — E1159 Type 2 diabetes mellitus with other circulatory complications: Secondary | ICD-10-CM | POA: Insufficient documentation

## 2020-06-13 MED ORDER — HYDROCODONE-ACETAMINOPHEN 10-325 MG PO TABS: ORAL_TABLET | ORAL | 0 refills | Status: DC

## 2020-06-13 MED ORDER — HYDROCODONE-ACETAMINOPHEN 10-325 MG PO TABS: 1.0000 | ORAL_TABLET | Freq: Four times a day (QID) | ORAL | 0 refills | Status: DC | PRN

## 2020-06-13 NOTE — Assessment & Plan Note (Signed)
S/p decompressive laminectomy , foraminotomies, and fusion in 2017.  Currently having radicular Pain aggravated by activities and limiting activity .  He has received 2 ESI and is awaiting one more procedure.  Refilling hydrocodone

## 2020-06-13 NOTE — Assessment & Plan Note (Signed)
Secondary to traumatic injury, remote, and lumbar spondylosis with history of decompression in 2018 .  Pain has become more problematic in the last few months and hhe has been receiving ESI with little benefit .  Continue vicodin up to 4 times daily,  cymbalta and  celebrex 100 mg bid.   Refill history confirmed via [place] Controlled Substance database, accessed by me today.[name]

## 2020-06-13 NOTE — Assessment & Plan Note (Addendum)
Complicated by fatty liver,  Hypertension. I have strongly advised him to lose weight with a goal of 10% of body weight over the next 6 months using a low glycemic index diet and regular exercise a minimum of 5 days per week.   

## 2020-07-14 ENCOUNTER — Other Ambulatory Visit: Payer: Self-pay | Admitting: Internal Medicine

## 2020-07-28 ENCOUNTER — Other Ambulatory Visit: Payer: Self-pay | Admitting: Internal Medicine

## 2020-08-01 ENCOUNTER — Telehealth: Payer: Self-pay | Admitting: Internal Medicine

## 2020-08-01 MED ORDER — HYDROCODONE-ACETAMINOPHEN 10-325 MG PO TABS: ORAL_TABLET | ORAL | 0 refills | Status: DC

## 2020-08-01 NOTE — Telephone Encounter (Signed)
Patient was informed of the below.

## 2020-08-01 NOTE — Telephone Encounter (Signed)
Pt needs his refill on Norco to say he can get it on 8/27 with [facility] because they are closed on 8/29. Pharmacy told him to ask if it is ok to fill early.  Pt is leaving to go out of town Sunday for a funeral and will not be back until Wed 9/1?

## 2020-08-01 NOTE — Telephone Encounter (Signed)
Yes, ok to fill today.  rx sent to [facility] with today's date.  Remind patient that the next month refill will remain at the date it was originally written for (see chart )

## 2020-08-04 ENCOUNTER — Other Ambulatory Visit: Payer: Self-pay | Admitting: Internal Medicine

## 2020-08-14 ENCOUNTER — Other Ambulatory Visit: Payer: Self-pay | Admitting: Internal Medicine

## 2020-09-05 MED ORDER — HYDROCODONE-ACETAMINOPHEN 10-325 MG PO TABS: 1.0000 | ORAL_TABLET | Freq: Four times a day (QID) | ORAL | 0 refills | Status: DC | PRN

## 2020-09-12 ENCOUNTER — Other Ambulatory Visit: Payer: Self-pay | Admitting: Internal Medicine

## 2020-09-12 ENCOUNTER — Encounter: Payer: Self-pay | Admitting: Internal Medicine

## 2020-09-12 ENCOUNTER — Ambulatory Visit (INDEPENDENT_AMBULATORY_CARE_PROVIDER_SITE_OTHER): Payer: No Typology Code available for payment source | Admitting: Internal Medicine

## 2020-09-12 ENCOUNTER — Other Ambulatory Visit: Payer: Self-pay

## 2020-09-12 VITALS — BP 142/98 | HR 62 | Temp 98.3°F | Resp 16 | Ht 70.0 in | Wt 265.6 lb

## 2020-09-12 DIAGNOSIS — M4317 Spondylolisthesis, lumbosacral region: Secondary | ICD-10-CM

## 2020-09-12 DIAGNOSIS — I10 Essential (primary) hypertension: Secondary | ICD-10-CM

## 2020-09-12 DIAGNOSIS — Z23 Encounter for immunization: Secondary | ICD-10-CM

## 2020-09-12 DIAGNOSIS — Z7902 Long term (current) use of antithrombotics/antiplatelets: Secondary | ICD-10-CM

## 2020-09-12 DIAGNOSIS — E785 Hyperlipidemia, unspecified: Secondary | ICD-10-CM

## 2020-09-12 DIAGNOSIS — F411 Generalized anxiety disorder: Secondary | ICD-10-CM

## 2020-09-12 DIAGNOSIS — E538 Deficiency of other specified B group vitamins: Secondary | ICD-10-CM

## 2020-09-12 DIAGNOSIS — E559 Vitamin D deficiency, unspecified: Secondary | ICD-10-CM | POA: Diagnosis not present

## 2020-09-12 MED ORDER — GABAPENTIN 400 MG PO CAPS: 400.0000 mg | ORAL_CAPSULE | Freq: Three times a day (TID) | ORAL | 1 refills | Status: DC

## 2020-09-12 MED ORDER — ALPRAZOLAM 0.25 MG PO TABS: 0.2500 mg | ORAL_TABLET | Freq: Every evening | ORAL | 0 refills | Status: DC | PRN

## 2020-09-12 MED ORDER — POTASSIUM CHLORIDE CRYS ER 20 MEQ PO TBCR: 20.0000 meq | EXTENDED_RELEASE_TABLET | Freq: Every day | ORAL | 3 refills | Status: DC

## 2020-09-12 NOTE — Progress Notes (Signed)
Subjective:  Patient ID: Tyrone Mcdowell, male    DOB: 1955-12-27  Age: 64 y.o. MRN: [id number]  CC: The primary encounter diagnosis was Hyperlipidemia LDL goal <100. Diagnoses of Primary hypertension, Morbid obesity ([place]), Vitamin D deficiency, B12 deficiency, Encounter for current long term use of antiplatelet drug, Need for immunization against influenza, Spondylolisthesis at L5-S1 level, and Anxiety, generalized were also pertinent to this visit.  HPI Tyrone Mcdowell presents for follow up on multiple issues  1) increased anxiety:  Attributed  "too much going on ".  Sister in law underwent amputation of several necrotic toes and was sent home with a wound vac,  Has been staying with them due to temporary disablity.  Feels she is taking advantage of the situation and feels resentful. Also his  sister is dying.  Has been irritable,  Stress eating and weight gain   2) Chronic back pain: managed with scheduled narcotics.  He has become intolerant of any significant exertion due to develop of bilateral burning sensation in both hips with any incline on walk   Lab Results  Component Value Date   PSA 0.12 06/11/2020   PSA 0.26 11/15/2018   PSA 0.32 12/27/2016      Outpatient Medications Prior to Visit  Medication Sig Dispense Refill  . amLODipine (NORVASC) 10 MG tablet TAKE 1 TABLET BY MOUTH DAILY 90 tablet 1  . atorvastatin (LIPITOR) 20 MG tablet TAKE 1 TABLET BY MOUTH DAILY AT 6 PM. 90 tablet 1  . clopidogrel (PLAVIX) 75 MG tablet TAKE 1 TABLET (75 MG TOTAL) BY MOUTH DAILY. 90 tablet 1  . DULoxetine (CYMBALTA) 30 MG capsule TAKE 1 CAPSULE BY MOUTH TWICE DAILY 180 capsule 1  . furosemide (LASIX) 20 MG tablet Take 1 tablet (20 mg total) by mouth daily as needed. 90 tablet 2  . HYDROcodone-acetaminophen (NORCO) 10-325 MG tablet Take 1 tablet by mouth every 6 (six) hours as needed. 120 tablet 0  . HYDROcodone-acetaminophen (NORCO) 10-325 MG tablet TAKE 1 TABLET BY MOUTH EVERY 6 HOURS AS  NEEDED. 120 tablet 0  . HYDROcodone-acetaminophen (NORCO) 10-325 MG tablet Take 1 tablet by mouth every 6 (six) hours as needed. 120 tablet 0  . pantoprazole (PROTONIX) 40 MG tablet TAKE 1 TABLET BY MOUTH DAILY. 90 tablet 3  . tiZANidine (ZANAFLEX) 4 MG tablet Take by mouth.    . valsartan-hydrochlorothiazide (DIOVAN-HCT) 80-12.5 MG tablet TAKE 2 TABLETS BY MOUTH DAILY. 180 tablet 0  . gabapentin (NEURONTIN) 300 MG capsule Take 1 capsule (300 mg total) by mouth 3 (three) times daily. 180 capsule 4  . tizanidine (ZANAFLEX) 6 MG capsule TAKE 1 CAPSULE (6MG ) BY MOUTH 3 TIMES DAILY AS NEEDED FOR MUSCLE SPASMS. (Patient not taking: Reported on 09/12/2020) 90 capsule 1   No facility-administered medications prior to visit.    Review of Systems;  Patient denies headache, fevers, malaise, unintentional weight loss, skin rash, eye pain, sinus congestion and sinus pain, sore throat, dysphagia,  hemoptysis , cough, dyspnea, wheezing, chest pain, palpitations, orthopnea, edema, abdominal pain, nausea, melena, diarrhea, constipation, flank pain, dysuria, hematuria, urinary  Frequency, nocturia, numbness, tingling, seizures,  Focal weakness, Loss of consciousness,  Tremor, insomnia, depression, anxiety, and suicidal ideation.      Objective:  BP (!) 142/98 (BP Location: Left Arm, Patient Position: Sitting, Cuff Size: Large)   Pulse 62   Temp 98.3 F (36.8 C) (Oral)   Resp 16   Ht 5\' 10"  (1.778 m)   Wt 265 lb 9.6 oz (  120.5 kg)   SpO2 99%   BMI 38.11 kg/m   BP Readings from Last 3 Encounters:  09/12/20 (!) 142/98  06/11/20 118/80  02/06/20 (!) 163/92    Wt Readings from Last 3 Encounters:  09/12/20 265 lb 9.6 oz (120.5 kg)  06/11/20 266 lb 12.8 oz (121 kg)  02/06/20 258 lb (117 kg)    General appearance: alert, cooperative and appears stated age Ears: normal TM's and external ear canals both ears Throat: lips, mucosa, and tongue normal; teeth and gums normal Neck: no adenopathy, no carotid  bruit, supple, symmetrical, trachea midline and thyroid not enlarged, symmetric, no tenderness/mass/nodules Back: symmetric, no curvature. ROM normal. No CVA tenderness. Lungs: clear to auscultation bilaterally Heart: regular rate and rhythm, S1, S2 normal, no murmur, click, rub or gallop Abdomen: soft, non-tender; bowel sounds normal; no masses,  no organomegaly Pulses: 2+ and symmetric Skin: Skin color, texture, turgor normal. No rashes or lesions Lymph nodes: Cervical, supraclavicular, and axillary nodes normal.  Lab Results  Component Value Date   HGBA1C 5.6 06/12/2020   HGBA1C 5.5 05/09/2018   HGBA1C 5.9 11/07/2017    Lab Results  Component Value Date   CREATININE 0.97 06/11/2020   CREATININE 1.09 08/24/2019   CREATININE 0.86 02/05/2019    Lab Results  Component Value Date   WBC 6.2 06/11/2020   HGB 13.1 06/11/2020   HCT 39.3 06/11/2020   PLT 240.0 06/11/2020   GLUCOSE 127 (H) 06/11/2020   CHOL 134 06/11/2020   TRIG 170.0 (H) 06/11/2020   HDL 41.00 06/11/2020   LDLDIRECT 142.8 10/23/2012   LDLCALC 59 06/11/2020   ALT 56 (H) 06/11/2020   AST 24 06/11/2020   NA 141 06/11/2020   K 3.6 06/11/2020   CL 101 06/11/2020   CREATININE 0.97 06/11/2020   BUN 19 06/11/2020   CO2 31 06/11/2020   TSH 1.21 11/07/2017   PSA 0.12 06/11/2020   INR 1.07 05/08/2018   HGBA1C 5.6 06/12/2020   MICROALBUR 3.8 (H) 06/11/2020    MR Lumbar Spine Wo Contrast  Result Date: 02/20/2020 CLINICAL DATA:  Low back pain with left-sided radiculopathy EXAM: MRI LUMBAR SPINE WITHOUT CONTRAST TECHNIQUE: Multiplanar, multisequence MR imaging of the lumbar spine was performed. No intravenous contrast was administered. COMPARISON:  11/09/2017 FINDINGS: Segmentation:  Standard. Alignment: 6 mm anterolisthesis L5 on S1. 2 mm retrolisthesis L4 on L5. Vertebrae: No fracture, evidence of discitis, or bone lesion. Postsurgical changes from posterior and interbody fusion at L5-S1 with posterior decompression.  Conus medullaris and cauda equina: Conus extends to the L1-2 level. Conus and cauda equina appear normal. Paraspinal and other soft tissues: Negative. Disc levels: T12-L1: No significant disc protrusion, foraminal stenosis, or canal stenosis. L1-L2: No significant disc protrusion, foraminal stenosis, or canal stenosis. L2-L3: No significant disc protrusion, foraminal stenosis, or canal stenosis. L3-L4: No significant disc protrusion, foraminal stenosis, or canal stenosis. L4-L5: Mild diffuse disc bulge and mild bilateral facet arthrosis. Mild left foraminal stenosis without evidence of neural impingement. No right foraminal or canal stenosis. Slight interval progression from prior. L5-S1: Prior fusion and posterior decompression. Anterolisthesis. Canal is widely patent. Suspect mild right greater than left foraminal stenosis, evaluation slightly degraded by susceptibility artifact from adjacent hardware. No appreciable interval change from prior. IMPRESSION: 1. Postsurgical changes related to L5-S1 posterior and interbody fusion with posterior decompression. Canal remains widely patent without stenosis. Suspect mild right greater than left foraminal stenosis at this level, slightly degraded by artifact. No appreciable interval change. 2. Mild left  foraminal stenosis at L4-5, slightly progressed from prior. Electronically Signed   By: Davina Poke D.O.   On: 02/20/2020 14:39    Assessment & Plan:   Problem List Items Addressed This Visit      Unprioritized   Anxiety, generalized    He declines daily treatment as he feels that his symptoms are reactive to the situation at home. He has used alprazolam in the past. Discussed infrequent prn use to avoid conflict at home turning into shouting matches.   Refills given The risks and benefits of benzodiazepine use were discussed with patient today including excessive sedation leading to respiratory depression,  impaired thinking/driving, and addiction.  Patient  was advised to avoid concurrent use with alcohol, to use medication only as needed and not to share with others  .       Relevant Medications   ALPRAZolam (XANAX) 0.25 MG tablet   Hyperlipidemia LDL goal <100 - Primary   Relevant Orders   Lipid panel   Hypertension    he reports compliance with medication regimen  but has an elevated reading today in office.  he is not using NSAIDs daily.  Discussed goal of 120/70  (130/80 for patients over 70)  to preserve renal function.  She has been asked to check her  BP  at home and  submit readings for evaluation. Renal function, electrolytes and screen for proteinuria are all normal       Relevant Orders   Comprehensive metabolic panel   Morbid obesity ([place])    Complicated by fatty liver,  Hypertension. I have strongly advised him to lose weight with a goal of 10% of body weight over the next 6 months using a low glycemic index diet and regular exercise a minimum of 5 days per week.        Spondylolisthesis at L5-S1 level    S/p decompressive laminectomy , foraminotomies, and fusion in 2017.  Currently having radicular Pain aggravated by activities and limiting activity .  He has received ESU without significant relief in his pain .  Weight loss advised,  PT advised but deferred. Refill history confirmed via [place] Controlled Substance databas, accessed by me today.[name]  Refilling hydrocodone for 3 more months.  Will increase gabapentin to 400 mg tid        Other Visit Diagnoses    Vitamin D deficiency       Relevant Orders   VITAMIN D 25 Hydroxy (Vit-D Deficiency, Fractures)   B12 deficiency       Relevant Orders   Vitamin B12   Encounter for current long term use of antiplatelet drug       Relevant Orders   CBC with Differential/Platelet   Need for immunization against influenza       Relevant Orders   Flu Vaccine QUAD 36+ mos IM (Completed)      I have changed Tyrone Mcdowell's gabapentin. I am also having him start on ALPRAZolam and  potassium chloride SA. Additionally, I am having him maintain his furosemide, pantoprazole, HYDROcodone-acetaminophen, DULoxetine, valsartan-hydrochlorothiazide, clopidogrel, HYDROcodone-acetaminophen, amLODipine, atorvastatin, HYDROcodone-acetaminophen, and tiZANidine.  Meds ordered this encounter  Medications  . gabapentin (NEURONTIN) 400 MG capsule    Sig: Take 1 capsule (400 mg total) by mouth 3 (three) times daily.    Dispense:  90 capsule    Refill:  1  . ALPRAZolam (XANAX) 0.25 MG tablet    Sig: Take 1 tablet (0.25 mg total) by mouth at bedtime as needed for anxiety.  Dispense:  20 tablet    Refill:  0  . potassium chloride SA (KLOR-CON) 20 MEQ tablet    Sig: Take 1 tablet (20 mEq total) by mouth daily.    Dispense:  30 tablet    Refill:  3    Medications Discontinued During This Encounter  Medication Reason  . tizanidine (ZANAFLEX) 6 MG capsule Change in therapy  . gabapentin (NEURONTIN) 300 MG capsule     Follow-up: Return in about 3 months (around 12/13/2020).   [name], MD

## 2020-09-12 NOTE — Patient Instructions (Addendum)
  INCREASE GABAPENTIN TO 400 MG THREE TIMES DAILY for the back pain   ADD 20 MG LASIX IN THE MORNING FOR blood pressure .  You will need to take a potassium pill daily to keep your potassium from dropping. The potassium pill can be dissolved in liquid or cut in half if needed    ADDING ALPRAZOLAM FOR "PRN " USE   Refills for November December and January will be sent   Fasting labs prior to that visit.

## 2020-09-14 DIAGNOSIS — F411 Generalized anxiety disorder: Secondary | ICD-10-CM | POA: Insufficient documentation

## 2020-09-14 NOTE — Assessment & Plan Note (Signed)
He declines daily treatment as he feels that his symptoms are reactive to the situation at home. He has used alprazolam in the past. Discussed infrequent prn use to avoid conflict at home turning into shouting matches.   Refills given The risks and benefits of benzodiazepine use were discussed with patient today including excessive sedation leading to respiratory depression,  impaired thinking/driving, and addiction.  Patient was advised to avoid concurrent use with alcohol, to use medication only as needed and not to share with others  .

## 2020-09-14 NOTE — Assessment & Plan Note (Addendum)
S/p decompressive laminectomy , foraminotomies, and fusion in 2017.  Currently having radicular Pain aggravated by activities and limiting activity .  He has received ESU without significant relief in his pain .  Weight loss advised,  PT advised but deferred. Refill history confirmed via [place] Controlled Substance databas, accessed by me today.[name]  Refilling hydrocodone for 3 more months.  Will increase gabapentin to 400 mg tid

## 2020-09-14 NOTE — Assessment & Plan Note (Signed)
he reports compliance with medication regimen  but has an elevated reading today in office.  he is not using NSAIDs daily.  Discussed goal of 120/70  (130/80 for patients over 70)  to preserve renal function.  She has been asked to check her  BP  at home and  submit readings for evaluation. Renal function, electrolytes and screen for proteinuria are all normal

## 2020-09-14 NOTE — Assessment & Plan Note (Signed)
Complicated by fatty liver,  Hypertension. I have strongly advised him to lose weight with a goal of 10% of body weight over the next 6 months using a low glycemic index diet and regular exercise a minimum of 5 days per week.

## 2020-10-02 ENCOUNTER — Other Ambulatory Visit: Payer: Self-pay | Admitting: Internal Medicine

## 2020-10-02 MED ORDER — HYDROCODONE-ACETAMINOPHEN 10-325 MG PO TABS: 1.0000 | ORAL_TABLET | Freq: Four times a day (QID) | ORAL | 0 refills | Status: DC | PRN

## 2020-10-02 MED ORDER — HYDROCODONE-ACETAMINOPHEN 10-325 MG PO TABS: ORAL_TABLET | ORAL | 0 refills | Status: DC

## 2020-10-02 MED ORDER — ALPRAZOLAM 0.25 MG PO TABS: 0.2500 mg | ORAL_TABLET | Freq: Every evening | ORAL | 1 refills | Status: DC | PRN

## 2020-10-14 ENCOUNTER — Other Ambulatory Visit: Payer: Self-pay | Admitting: Internal Medicine

## 2020-10-15 ENCOUNTER — Other Ambulatory Visit: Payer: Self-pay | Admitting: Internal Medicine

## 2020-12-01 ENCOUNTER — Other Ambulatory Visit: Payer: Self-pay | Admitting: Internal Medicine

## 2020-12-10 ENCOUNTER — Other Ambulatory Visit: Payer: Self-pay | Admitting: Internal Medicine

## 2020-12-10 NOTE — Telephone Encounter (Signed)
RX Refill: xanax Last Seen:09-12-20 Last ordered:10-02-20

## 2020-12-11 ENCOUNTER — Other Ambulatory Visit (INDEPENDENT_AMBULATORY_CARE_PROVIDER_SITE_OTHER): Payer: No Typology Code available for payment source

## 2020-12-11 ENCOUNTER — Other Ambulatory Visit: Payer: Self-pay

## 2020-12-11 DIAGNOSIS — Z7902 Long term (current) use of antithrombotics/antiplatelets: Secondary | ICD-10-CM | POA: Diagnosis not present

## 2020-12-11 DIAGNOSIS — E785 Hyperlipidemia, unspecified: Secondary | ICD-10-CM

## 2020-12-11 DIAGNOSIS — E559 Vitamin D deficiency, unspecified: Secondary | ICD-10-CM

## 2020-12-11 DIAGNOSIS — E538 Deficiency of other specified B group vitamins: Secondary | ICD-10-CM

## 2020-12-11 DIAGNOSIS — I10 Essential (primary) hypertension: Secondary | ICD-10-CM | POA: Diagnosis not present

## 2020-12-11 LAB — CBC WITH DIFFERENTIAL/PLATELET
Basophils Absolute: 0 10*3/uL (ref 0.0–0.1)
Basophils Relative: 0.6 % (ref 0.0–3.0)
Eosinophils Absolute: 0.1 10*3/uL (ref 0.0–0.7)
Eosinophils Relative: 2.5 % (ref 0.0–5.0)
HCT: 41.7 % (ref 39.0–52.0)
Hemoglobin: 13.8 g/dL (ref 13.0–17.0)
Lymphocytes Relative: 33.8 % (ref 12.0–46.0)
Lymphs Abs: 1.8 10*3/uL (ref 0.7–4.0)
MCHC: 33.1 g/dL (ref 30.0–36.0)
MCV: 93 fl (ref 78.0–100.0)
Monocytes Absolute: 0.6 10*3/uL (ref 0.1–1.0)
Monocytes Relative: 10.8 % (ref 3.0–12.0)
Neutro Abs: 2.8 10*3/uL (ref 1.4–7.7)
Neutrophils Relative %: 52.3 % (ref 43.0–77.0)
Platelets: 260 10*3/uL (ref 150.0–400.0)
RBC: 4.48 Mil/uL (ref 4.22–5.81)
RDW: 13.5 % (ref 11.5–15.5)
WBC: 5.3 10*3/uL (ref 4.0–10.5)

## 2020-12-11 LAB — VITAMIN D 25 HYDROXY (VIT D DEFICIENCY, FRACTURES): VITD: 34.58 ng/mL (ref 30.00–100.00)

## 2020-12-11 LAB — COMPREHENSIVE METABOLIC PANEL
ALT: 27 U/L (ref 0–53)
AST: 18 U/L (ref 0–37)
Albumin: 4.3 g/dL (ref 3.5–5.2)
Alkaline Phosphatase: 88 U/L (ref 39–117)
BUN: 14 mg/dL (ref 6–23)
CO2: 35 mEq/L — ABNORMAL HIGH (ref 19–32)
Calcium: 10 mg/dL (ref 8.4–10.5)
Chloride: 101 mEq/L (ref 96–112)
Creatinine, Ser: 0.99 mg/dL (ref 0.40–1.50)
GFR: 81.81 mL/min (ref >60.00)
Glucose, Bld: 89 mg/dL (ref 70–99)
Potassium: 4.2 mEq/L (ref 3.5–5.1)
Sodium: 140 mEq/L (ref 135–145)
Total Bilirubin: 0.5 mg/dL (ref 0.2–1.2)
Total Protein: 6.7 g/dL (ref 6.0–8.3)

## 2020-12-11 LAB — LIPID PANEL
Cholesterol: 135 mg/dL (ref 0–200)
HDL: 45.2 mg/dL (ref >39.00)
LDL Cholesterol: 75 mg/dL (ref 0–99)
NonHDL: 89.81
Total CHOL/HDL Ratio: 3
Triglycerides: 75 mg/dL (ref 0.0–149.0)
VLDL: 15 mg/dL (ref 0.0–40.0)

## 2020-12-11 LAB — VITAMIN B12: Vitamin B-12: 449 pg/mL (ref 211–911)

## 2020-12-13 NOTE — Progress Notes (Signed)
Your vitamin D, B12, CBC,  cholesterol,  liver and kidney function are normal.  You do not need any medication changes.  Regards,   Dr. Derrel Nip

## 2020-12-15 ENCOUNTER — Other Ambulatory Visit: Payer: Self-pay

## 2020-12-15 ENCOUNTER — Ambulatory Visit: Payer: No Typology Code available for payment source | Admitting: Internal Medicine

## 2020-12-16 ENCOUNTER — Other Ambulatory Visit: Payer: Self-pay | Admitting: Internal Medicine

## 2020-12-29 ENCOUNTER — Other Ambulatory Visit: Payer: Self-pay | Admitting: Internal Medicine

## 2021-01-01 ENCOUNTER — Telehealth (INDEPENDENT_AMBULATORY_CARE_PROVIDER_SITE_OTHER): Payer: No Typology Code available for payment source | Admitting: Internal Medicine

## 2021-01-01 ENCOUNTER — Encounter: Payer: Self-pay | Admitting: Internal Medicine

## 2021-01-01 ENCOUNTER — Telehealth: Payer: Self-pay | Admitting: Internal Medicine

## 2021-01-01 ENCOUNTER — Other Ambulatory Visit: Payer: Self-pay | Admitting: Internal Medicine

## 2021-01-01 VITALS — BP 142/87 | Ht 70.0 in | Wt 261.0 lb

## 2021-01-01 DIAGNOSIS — M4317 Spondylolisthesis, lumbosacral region: Secondary | ICD-10-CM | POA: Diagnosis not present

## 2021-01-01 DIAGNOSIS — M5417 Radiculopathy, lumbosacral region: Secondary | ICD-10-CM | POA: Diagnosis not present

## 2021-01-01 MED ORDER — HYDROCODONE-ACETAMINOPHEN 10-325 MG PO TABS: 1.0000 | ORAL_TABLET | ORAL | 0 refills | Status: DC | PRN

## 2021-01-01 MED ORDER — PREDNISONE 10 MG PO TABS: ORAL_TABLET | ORAL | 0 refills | Status: DC

## 2021-01-01 NOTE — Progress Notes (Signed)
Virtual Visit via [place]  This visit type was conducted due to national recommendations for restrictions regarding the COVID-19 pandemic (e.g. social distancing).  This format is felt to be most appropriate for this patient at this time.  All issues noted in this document were discussed and addressed.  No physical exam was performed (except for noted visual exam findings with Video Visits).   I connected with@ on 01/01/21 at  8:00 AM EST by a video enabled telemedicine applicationand verified that I am speaking with the correct person using two identifiers. Location patient: home Location provider: work or home office Persons participating in the virtual visit: patient, provider  I discussed the limitations, risks, security and privacy concerns of performing an evaluation and management service by telephone and the availability of in person appointments. I also discussed with the patient that there may be a patient responsible charge related to this service. The patient expressed understanding and agreed to proceed.  Reason for visit: low back pain with left sided radiculopathy  HPI:  65 yr old male with obesity, chronic low back pain,  Prior L5-S1 fusion and posterior decompression ,managed with chronic opioid therapy  presents with one month history of  increased pain radiating to left hip and down to  left foot.  pain management has been previously adequate with use of hydrocodone 10/325 q 6 hours but he is now requesting an increase in analgesic therapy due to increased pain that is activity limiting .  His pain is only currently controlled when sitting in a chair or lying in his recliner.  Walking more than 50 feet increases his discomfort. [name] He denies foot drop and there is no recent history of fall.  He has  Lost 4 lbs since his last visit .  BMI is 37.   ROS: See pertinent positives and negatives per HPI.  Past Medical History:  Diagnosis Date  . Anxiety    takes Valium as needed   . Arthritis   . Chronic back pain    spondylolisthesis  . Depression    takes Zoloft daily  . Hyperlipidemia    takes Atorvastatin daily  . Hypertension    takes Micardis and AMlodipine daily  . Joint pain   . Joint swelling   . Peripheral edema    takes Lasix daily  . Urinary frequency    takes Rapaflo daily  . Weakness    numbness and tingling    Past Surgical History:  Procedure Laterality Date  . BACK SURGERY  04/2016  . COLONOSCOPY    . TIBIA FRACTURE SURGERY Bilateral 1989   multiple surgeries   . TIBIA HARDWARE REMOVAL  1989    Family History  Problem Relation Age of Onset  . Cancer Mother        lung Ca, tobacco abuse  . CAD Mother        MI at age 59  . Stroke Father   . Hypertension Father   . Heart disease Father        pacemaker, heart failure  . Kidney disease Father   . Ulcerative colitis Father   . Cancer Sister 53       colon CA  . Cancer Other        colon CA at age 59  . Ulcerative colitis Brother     SOCIAL HX:  reports that he quit smoking about 7 years ago. His smoking use included cigarettes. He started smoking about 43 years ago. He smoked 0.00  packs per day for 34.00 years. His smokeless tobacco use includes chew. He reports that he does not drink alcohol and does not use drugs.   Current Outpatient Medications:  .  ALPRAZolam (XANAX) 0.25 MG tablet, TAKE 1 TABLET BY MOUTH AT BEDTIME AS NEEDED FOR ANXIETY, Disp: 30 tablet, Rfl: 1 .  amLODipine (NORVASC) 10 MG tablet, TAKE 1 TABLET BY MOUTH DAILY, Disp: 90 tablet, Rfl: 1 .  atorvastatin (LIPITOR) 20 MG tablet, TAKE 1 TABLET BY MOUTH DAILY AT 6 PM., Disp: 90 tablet, Rfl: 1 .  clopidogrel (PLAVIX) 75 MG tablet, TAKE 1 TABLET (75 MG TOTAL) BY MOUTH DAILY., Disp: 90 tablet, Rfl: 1 .  DULoxetine (CYMBALTA) 30 MG capsule, TAKE 1 CAPSULE BY MOUTH TWICE DAILY, Disp: 180 capsule, Rfl: 1 .  furosemide (LASIX) 20 MG tablet, Take 1 tablet (20 mg total) by mouth daily as needed., Disp: 90 tablet,  Rfl: 2 .  gabapentin (NEURONTIN) 400 MG capsule, TAKE 1 CAPSULE BY MOUTH 3 TIMES DAILY, Disp: 90 capsule, Rfl: 1 .  HYDROcodone-acetaminophen (NORCO) 10-325 MG tablet, Take 1 tablet by mouth every 6 (six) hours as needed., Disp: 120 tablet, Rfl: 0 .  HYDROcodone-acetaminophen (NORCO) 10-325 MG tablet, TAKE 1 TABLET BY MOUTH EVERY 6 HOURS AS NEEDED., Disp: 120 tablet, Rfl: 0 .  pantoprazole (PROTONIX) 40 MG tablet, TAKE 1 TABLET BY MOUTH DAILY., Disp: 90 tablet, Rfl: 3 .  potassium chloride SA (KLOR-CON) 20 MEQ tablet, Take 1 tablet (20 mEq total) by mouth daily., Disp: 30 tablet, Rfl: 3 .  predniSONE (DELTASONE) 10 MG tablet, 6 tablets on Day 1 , then reduce by 1 tablet daily until gone, Disp: 21 tablet, Rfl: 0 .  tiZANidine (ZANAFLEX) 4 MG tablet, Take by mouth., Disp: , Rfl:  .  valsartan-hydrochlorothiazide (DIOVAN-HCT) 80-12.5 MG tablet, TAKE 2 TABLETS BY MOUTH DAILY., Disp: 180 tablet, Rfl: 1 .  HYDROcodone-acetaminophen (NORCO) 10-325 MG tablet, Take 1 tablet by mouth every 4 (four) hours as needed for moderate pain., Disp: 180 tablet, Rfl: 0  EXAM:  VITALS per patient if applicable:  GENERAL: alert, oriented, appears well and in no acute distress  HEENT: atraumatic, conjunttiva clear, no obvious abnormalities on inspection of external nose and ears  NECK: normal movements of the head and neck  LUNGS: on inspection no signs of respiratory distress, breathing rate appears normal, no obvious gross SOB, gasping or wheezing  CV: no obvious cyanosis  MS: moves all visible extremities without noticeable abnormality.  Demonstration of supported back extension while standing results in radiation of pain to left lower leg   PSYCH/NEURO: pleasant and cooperative, no obvious depression or anxiety, speech and thought processing grossly intact  ASSESSMENT AND PLAN:  Discussed the following assessment and plan:  Spondylolisthesis at L5-S1 level - Plan: MR Lumbar Spine Wo Contrast, Ambulatory  referral to Neurosurgery  Radiculitis, lumbosacral - Plan: MR Lumbar Spine Wo Contrast, Ambulatory referral to Neurosurgery  Spondylolisthesis at L5-S1 level S/p decompressive laminectomy , foraminotomies, and fusion in 2017 by Deri Fuelling.  He is  Currently having radicular Pain aggravated by activities and limiting his activity .  He has received ESI without significant relief in his pain .  Weight loss advised,  PT advised but deferred. MRI and referral to neurosurgery ordered. Refill history confirmed via [place] Controlled Substance databas, accessed by me today.[name] adding prednisone taper and increasing qty of hydrocodone to 6 daily for one month     I discussed the assessment and treatment plan with the patient.  The patient was provided an opportunity to ask questions and all were answered. The patient agreed with the plan and demonstrated an understanding of the instructions.   The patient was advised to call back or seek an in-person evaluation if the symptoms worsen or if the condition fails to improve as anticipated.  I spent 30 minutes dedicated to the care of this patient on the date of this encounter to include pre-visit review of his medical history,  Face-to-face time with the patient , and post visit ordering of testing and therapeutics.  [name], MD

## 2021-01-01 NOTE — Telephone Encounter (Signed)
lft vm for pt to call ofc to verify location for MRI and to see if pt is claustrophobic.

## 2021-01-01 NOTE — Patient Instructions (Signed)
Prednisone taper as a trial  Increase hydrocodone to 6 daily.    You can take 2 at one time for 2 of your 4 doses   Referral to [place]   Please consider the [place] to help you lose weight.

## 2021-01-02 DIAGNOSIS — M5417 Radiculopathy, lumbosacral region: Secondary | ICD-10-CM | POA: Insufficient documentation

## 2021-01-02 NOTE — Assessment & Plan Note (Signed)
S/p decompressive laminectomy , foraminotomies, and fusion in 2017 by Deri Fuelling.  He is  Currently having radicular Pain aggravated by activities and limiting his activity .  He has received ESI without significant relief in his pain .  Weight loss advised,  PT advised but deferred. MRI and referral to neurosurgery ordered. Refill history confirmed via [place] Controlled Substance databas, accessed by me today.[name] adding prednisone taper and increasing qty of hydrocodone to 6 daily for one month

## 2021-01-05 ENCOUNTER — Ambulatory Visit: Payer: No Typology Code available for payment source

## 2021-01-12 ENCOUNTER — Other Ambulatory Visit: Payer: Self-pay | Admitting: Internal Medicine

## 2021-01-18 ENCOUNTER — Ambulatory Visit
Admission: RE | Admit: 2021-01-18 | Discharge: 2021-01-18 | Disposition: A | Discharge status: Home / Self Care | Payer: No Typology Code available for payment source | Source: Ambulatory Visit | Attending: Internal Medicine | Admitting: Internal Medicine

## 2021-01-18 DIAGNOSIS — M4317 Spondylolisthesis, lumbosacral region: Secondary | ICD-10-CM | POA: Insufficient documentation

## 2021-01-18 DIAGNOSIS — M5417 Radiculopathy, lumbosacral region: Secondary | ICD-10-CM | POA: Diagnosis present

## 2021-01-19 ENCOUNTER — Ambulatory Visit (INDEPENDENT_AMBULATORY_CARE_PROVIDER_SITE_OTHER): Payer: No Typology Code available for payment source | Admitting: Internal Medicine

## 2021-01-19 ENCOUNTER — Other Ambulatory Visit: Payer: Self-pay

## 2021-01-19 ENCOUNTER — Other Ambulatory Visit: Payer: Self-pay | Admitting: Internal Medicine

## 2021-01-19 ENCOUNTER — Encounter: Payer: Self-pay | Admitting: Internal Medicine

## 2021-01-19 VITALS — BP 150/108 | HR 86 | Temp 99.0°F | Resp 17 | Ht 70.0 in | Wt 275.2 lb

## 2021-01-19 DIAGNOSIS — M4317 Spondylolisthesis, lumbosacral region: Secondary | ICD-10-CM | POA: Diagnosis not present

## 2021-01-19 DIAGNOSIS — I7774 Dissection of vertebral artery: Secondary | ICD-10-CM | POA: Insufficient documentation

## 2021-01-19 MED ORDER — OXYCODONE HCL ER 15 MG PO T12A: 15.0000 mg | EXTENDED_RELEASE_TABLET | Freq: Two times a day (BID) | ORAL | 0 refills | Status: DC

## 2021-01-19 NOTE — Assessment & Plan Note (Signed)
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  His Last ESI in May did not provide significant relief of pain.  Neurosurgical follow up advised,  Will likely need conservative approach (weight loss ,  PT)   Pain management addressed today with rx for oxycontin 15 mg q 12 hours.  Hydrocodone 10/325 was last filled for #180 on Jan 27 . He has been advised to take his first dose of oxycontin 2-3 hours after his previous dose of hydrocodone and to suspend the hydrocodone . He may resume hydrocodone if pain is severe 3 hours after oxycontin dose. Follow up 2 weeks,  Prior to any refills

## 2021-01-19 NOTE — Patient Instructions (Addendum)
  I recommend scheduling an appt with Dr Trenton Gammon since he did your surgery  We discussed starting oxycontin every 12 hours for pain management.  The dose I have prescribed is 15 mg  Your first dose should be 1-2  Hours BEFORE  your next scheduled/anticipated  hydrocodone dose. DO NOT TAKE the next scheduled hydrocodone dose .  If your pain is still severe 3 hours after your first oxycontin dose,  You may take the hydrocodone .

## 2021-01-19 NOTE — Progress Notes (Signed)
Subjective:  Patient ID: Tyrone Mcdowell, male    DOB: June 18, 1956  Age: 65 y.o. MRN: [id number]  CC: The primary encounter diagnosis was Spondylolisthesis at L5-S1 level. A diagnosis of Morbid obesity ([place]) was also pertinent to this visit.  HPI Tyrone Mcdowell presents for follow up on worsening back pain with MRI lumbar spine done recently   This visit occurred during the SARS-CoV-2 public health emergency.  Safety protocols were in place, including screening questions prior to the visit, additional usage of staff PPE, and extensive cleaning of exam room while observing appropriate contact time as indicated for disinfecting solutions.   COVID 19 TESTED ON JAN 31 DUE TO EXPOSURE TO INFECTED WIFE.  HE HAD A  NEGATIVE RESULT  No symptoms MRI rescheduled to Feb 13 and reviewed today  He was seen on jan 27 for worsening bilateral leg pain despite trial of ESI (last procedure May 2021, [facility]) resulting in decreased mobility and request for increase in narcotics TO HYDROCODONE 10/325 EVERY 4 HOURS .[name]  He was sent for lumbar MRI and neurosurgical follow up with his neurosurgeon Dr Deri Fuelling,  Who performed  his lumbar surgery, L5-S1 decompressive laminectomy with bilateral L5, S1 decompressive foraminotomies  in 2017.   He states that he received a call from Dr. Irven [place] office but was unsure if I wanted him to see Dr Trenton Gammon or Dr Izora Ribas.  I encouarged him to see Dr Trenton Gammon .   MRI report reviewed,  With te only change from March 2021 MRI being  " Mild multifactorial L4-L5 bilateral neural foraminal narrowing, (greater on the left)".    Since 2017 he has gained 28 lbs and exhibits significant abdominal obesity . He is not exercising regularly.  The role of exercise and weight loss  was discussed today  And emphasized as critical components of back care   He is requesting a change in therapy to long acting opiates  to provide better overnight control of pain   Outpatient Medications Prior  to Visit  Medication Sig Dispense Refill  . ALPRAZolam (XANAX) 0.25 MG tablet TAKE 1 TABLET BY MOUTH AT BEDTIME AS NEEDED FOR ANXIETY 30 tablet 1  . amLODipine (NORVASC) 10 MG tablet TAKE 1 TABLET BY MOUTH DAILY 90 tablet 1  . atorvastatin (LIPITOR) 20 MG tablet TAKE 1 TABLET BY MOUTH DAILY AT 6 PM. 90 tablet 1  . clopidogrel (PLAVIX) 75 MG tablet TAKE 1 TABLET (75 MG TOTAL) BY MOUTH DAILY. 90 tablet 1  . DULoxetine (CYMBALTA) 30 MG capsule TAKE 1 CAPSULE BY MOUTH TWICE DAILY 180 capsule 1  . furosemide (LASIX) 20 MG tablet Take 1 tablet (20 mg total) by mouth daily as needed. 90 tablet 2  . gabapentin (NEURONTIN) 400 MG capsule TAKE 1 CAPSULE BY MOUTH 3 TIMES DAILY 90 capsule 1  . HYDROcodone-acetaminophen (NORCO) 10-325 MG tablet Take 1 tablet by mouth every 6 (six) hours as needed. 120 tablet 0  . HYDROcodone-acetaminophen (NORCO) 10-325 MG tablet TAKE 1 TABLET BY MOUTH EVERY 6 HOURS AS NEEDED. 120 tablet 0  . HYDROcodone-acetaminophen (NORCO) 10-325 MG tablet Take 1 tablet by mouth every 4 (four) hours as needed for moderate pain. 180 tablet 0  . pantoprazole (PROTONIX) 40 MG tablet TAKE 1 TABLET BY MOUTH DAILY. 90 tablet 3  . potassium chloride SA (KLOR-CON) 20 MEQ tablet TAKE 1 TABLET BY MOUTH DAILY. 30 tablet 3  . predniSONE (DELTASONE) 10 MG tablet 6 tablets on Day 1 , then reduce by  1 tablet daily until gone 21 tablet 0  . tiZANidine (ZANAFLEX) 4 MG tablet Take by mouth.    . valsartan-hydrochlorothiazide (DIOVAN-HCT) 80-12.5 MG tablet TAKE 2 TABLETS BY MOUTH DAILY. 180 tablet 1   No facility-administered medications prior to visit.    Review of Systems;  Patient denies headache, fevers, malaise, unintentional weight loss, skin rash, eye pain, sinus congestion and sinus pain, sore throat, dysphagia,  hemoptysis , cough, dyspnea, wheezing, chest pain, palpitations, orthopnea, edema, abdominal pain, nausea, melena, diarrhea, constipation, flank pain, dysuria, hematuria, urinary   Frequency, nocturia, numbness, tingling, seizures,  Focal weakness, Loss of consciousness,  Tremor, insomnia, depression, anxiety, and suicidal ideation.      Objective:  BP (!) 150/108 (BP Location: Left Arm, Patient Position: Sitting, Cuff Size: Large)   Pulse 86   Temp 99 F (37.2 C) (Oral)   Resp 17   Ht 5\' 10"  (1.778 m)   Wt 275 lb 3.2 oz (124.8 kg)   SpO2 98%   BMI 39.49 kg/m   BP Readings from Last 3 Encounters:  01/19/21 (!) 150/108  01/01/21 (!) 142/87  09/12/20 (!) 142/98    Wt Readings from Last 3 Encounters:  01/19/21 275 lb 3.2 oz (124.8 kg)  01/01/21 261 lb (118.4 kg)  09/12/20 265 lb 9.6 oz (120.5 kg)    General appearance: alert, obese, cooperative and appears stated age Ears: normal TM's and external ear canals both ears Throat: lips, mucosa, and tongue normal; teeth and gums normal Neck: no adenopathy, no carotid bruit, supple, symmetrical, trachea midline and thyroid not enlarged, symmetric, no tenderness/mass/nodules Back: symmetric, no curvature. ROM normal. No CVA tenderness. Lungs: clear to auscultation bilaterally Heart: regular rate and rhythm, S1, S2 normal, no murmur, click, rub or gallop Abdomen: soft, non-tender; bowel sounds normal; no masses,  no organomegaly Pulses: 2+ and symmetric Skin: Skin color, texture, turgor normal. No rashes or lesions Lymph nodes: Cervical, supraclavicular, and axillary nodes normal. MSK:  Trouble rising from seated position .  Positive straight leg lift on left.   Lab Results  Component Value Date   HGBA1C 5.6 06/12/2020   HGBA1C 5.5 05/09/2018   HGBA1C 5.9 11/07/2017    Lab Results  Component Value Date   CREATININE 0.99 12/11/2020   CREATININE 0.97 06/11/2020   CREATININE 1.09 08/24/2019    Lab Results  Component Value Date   WBC 5.3 12/11/2020   HGB 13.8 12/11/2020   HCT 41.7 12/11/2020   PLT 260.0 12/11/2020   GLUCOSE 89 12/11/2020   CHOL 135 12/11/2020   TRIG 75.0 12/11/2020   HDL 45.20  12/11/2020   LDLDIRECT 142.8 10/23/2012   LDLCALC 75 12/11/2020   ALT 27 12/11/2020   AST 18 12/11/2020   NA 140 12/11/2020   K 4.2 12/11/2020   CL 101 12/11/2020   CREATININE 0.99 12/11/2020   BUN 14 12/11/2020   CO2 35 (H) 12/11/2020   TSH 1.21 11/07/2017   PSA 0.12 06/11/2020   INR 1.07 05/08/2018   HGBA1C 5.6 06/12/2020   MICROALBUR 3.8 (H) 06/11/2020    MR Lumbar Spine Wo Contrast  Result Date: 01/18/2021 CLINICAL DATA:  Spondylolisthesis at L5-S1. Radiculitis, lumbosacral. Lumbar radiculopathy, prior surgery, new symptoms. Additional history provided by scanning technologist: Patient reports surgery 5 years ago, low back pain, bilateral hip and leg pain worse on the left than the right. EXAM: MRI LUMBAR SPINE WITHOUT CONTRAST TECHNIQUE: Multiplanar, multisequence MR imaging of the lumbar spine was performed. No intravenous contrast was administered. COMPARISON:  Lumbar spine MRI 02/20/2020. Lumbar spine radiographs 07/22/2016. FINDINGS: Segmentation: 5 lumbar vertebrae. The caudal most well-formed intervertebral disc space is designated L5-S1. A rudimentary interspace is present at S1-S2. Spinal numbering remains consistent with that utilized on the prior lumbar spine MRI of 02/20/2020. Alignment: Lumbar levocurvature. 5 mm L5-S1 grade 1 anterolisthesis, unchanged. 2 mm L4-L5 grade 1 retrolisthesis, unchanged. Trace L1-L2 grade 1 retrolisthesis, unchanged. Vertebrae: Vertebral body height is maintained. Redemonstrated sequela of prior L5-S1 posterior decompression and posterior spinal fusion. Susceptibility artifact arising from fusion hardware at this level. No appreciable significant marrow edema or focal suspicious osseous lesion. Conus medullaris and cauda equina: Conus extends to the L1-L2 level. No signal abnormality within the visualized distal spinal cord. Paraspinal and other soft tissues: No abnormality identified within included portions of the abdomen/retroperitoneum. Atrophy of  the lumbar paraspinal musculature. Postsurgical changes to the dorsal lumbar soft tissues at L5-S1. Disc levels: Unless otherwise stated, the level by level findings below have not significantly changed since prior MRI 04/21/2020. Mild/moderate disc degeneration at L4-L5. No more than mild disc degeneration at the non fused levels. T12-L1: No significant disc herniation or stenosis. L1-L2: Trace retrolisthesis. No significant disc herniation or stenosis. L2-L3: No significant disc herniation or stenosis. L3-L4: Minimal disc bulge. No significant spinal canal or foraminal stenosis. L4-L5: 2 mm grade 1 retrolisthesis. Disc bulge. Superimposed tiny central disc protrusion. Mild facet arthrosis. No significant spinal canal stenosis. Mild bilateral neural foraminal narrowing (greater on the left). L5-S1: Sequela of prior posterior decompression and posterior spinal fusion. Unchanged 5 mm grade 1 anterolisthesis. Disc uncovering with slight disc bulge and endplate spurring. Posterior element hypertrophy. No significant spinal canal stenosis. Susceptibility artifact from spinal fusion hardware somewhat limits evaluation of the neural foramina. However, there is apparent moderate to moderately advanced right and moderate left neural foraminal narrowing. IMPRESSION: Lumbar spondylosis and postoperative changes, as outlined and not significantly changed from the MRI of 02/20/2020. Findings are most notably as follows. At L5-S1, sequela of prior posterior decompression and posterior spinal fusion. Unchanged 5 mm grade 1 anterolisthesis. Disc uncovering with slight disc bulge and endplate spurring. Posterior element hypertrophy. No significant spinal canal stenosis. Susceptibility artifact from spinal fusion hardware somewhat limits evaluation of the neural foramina. However, there is apparent moderate to moderately advanced right and moderate left neural foraminal narrowing. Mild multifactorial L4-L5 bilateral neural foraminal  narrowing (greater on the left). Lumbar levocurvature. Electronically Signed   By: Kellie Simmering DO   On: 01/18/2021 15:10    Assessment & Plan:   Problem List Items Addressed This Visit      Unprioritized   Morbid obesity ([place])    Strongly advised him to lose 50 lbs over the next year to reduce strain on back,  Liver  etc      Spondylolisthesis at L5-S1 level - Primary    With progressive foraminal  stenosis L > R at L5-S1 level by repeat MRI.  His Last ESI in May did not provide significant relief of pain.  Neurosurgical follow up advised,  Will likely need conservative approach (weight loss ,  PT)   Pain management addressed today with rx for oxycontin 15 mg q 12 hours.  Hydrocodone 10/325 was last filled for #180 on Jan 27 . He has been advised to take his first dose of oxycontin 2-3 hours after his previous dose of hydrocodone and to suspend the hydrocodone . He may resume hydrocodone if pain is severe 3 hours after oxycontin dose. Follow up  2 weeks,  Prior to any refills        A total of 40 minutes was spent with patient more than half of which was spent in counseling patient on the above mentioned issues , reviewing and explaining recent labs and imaging studies done, and coordination of care.  I am having Verlin Dike start on oxyCODONE. I am also having him maintain his furosemide, clopidogrel, amLODipine, atorvastatin, tiZANidine, HYDROcodone-acetaminophen, HYDROcodone-acetaminophen, valsartan-hydrochlorothiazide, gabapentin, ALPRAZolam, pantoprazole, predniSONE, HYDROcodone-acetaminophen, DULoxetine, and potassium chloride SA.  Meds ordered this encounter  Medications  . oxyCODONE (OXYCONTIN) 15 mg 12 hr tablet    Sig: Take 1 tablet (15 mg total) by mouth every 12 (twelve) hours.    Dispense:  60 tablet    Refill:  0    Chronic back pain with radiculopathy    There are no discontinued medications.  Follow-up: Return in about 3 months (around 04/18/2021).   [name], MD

## 2021-01-19 NOTE — Assessment & Plan Note (Signed)
Strongly advised him to lose 50 lbs over the next year to reduce strain on back,  Liver  etc

## 2021-01-22 ENCOUNTER — Other Ambulatory Visit (HOSPITAL_COMMUNITY): Payer: Self-pay | Admitting: Neurosurgery

## 2021-01-22 ENCOUNTER — Other Ambulatory Visit: Payer: Self-pay | Admitting: Neurosurgery

## 2021-01-22 DIAGNOSIS — M4317 Spondylolisthesis, lumbosacral region: Secondary | ICD-10-CM

## 2021-01-23 ENCOUNTER — Other Ambulatory Visit: Payer: Self-pay | Admitting: Internal Medicine

## 2021-01-28 ENCOUNTER — Other Ambulatory Visit: Payer: Self-pay | Admitting: Internal Medicine

## 2021-01-28 MED ORDER — HYDROCODONE-ACETAMINOPHEN 10-325 MG PO TABS: 1.0000 | ORAL_TABLET | ORAL | 0 refills | Status: DC | PRN

## 2021-02-06 ENCOUNTER — Other Ambulatory Visit: Payer: Self-pay

## 2021-02-06 ENCOUNTER — Ambulatory Visit
Admission: RE | Admit: 2021-02-06 | Discharge: 2021-02-06 | Disposition: A | Discharge status: Home / Self Care | Payer: No Typology Code available for payment source | Source: Ambulatory Visit | Attending: Neurosurgery | Admitting: Neurosurgery

## 2021-02-06 DIAGNOSIS — M4317 Spondylolisthesis, lumbosacral region: Secondary | ICD-10-CM | POA: Insufficient documentation

## 2021-02-06 MED ORDER — GADOBUTROL 1 MMOL/ML IV SOLN
10.0000 mL | Freq: Once | INTRAVENOUS | Status: AC | PRN
  Administered 2021-02-06: 10 mL via INTRAVENOUS

## 2021-02-09 ENCOUNTER — Other Ambulatory Visit: Payer: Self-pay | Admitting: Internal Medicine

## 2021-02-09 NOTE — Telephone Encounter (Signed)
RX Refill:xanax Last Seen:01-20-20 Last ordered:12-10-20

## 2021-02-11 ENCOUNTER — Other Ambulatory Visit: Payer: Self-pay | Admitting: Internal Medicine

## 2021-02-14 ENCOUNTER — Other Ambulatory Visit: Payer: Self-pay | Admitting: Internal Medicine

## 2021-02-14 MED ORDER — OXYCODONE HCL ER 15 MG PO T12A: 15.0000 mg | EXTENDED_RELEASE_TABLET | Freq: Two times a day (BID) | ORAL | 0 refills | Status: DC

## 2021-02-16 ENCOUNTER — Telehealth: Payer: Self-pay

## 2021-02-16 NOTE — Telephone Encounter (Signed)
Pt came and dropped off a form to be signed from [employer]. Placed in folder up front. Please call pt to pick up when complete

## 2021-02-16 NOTE — Telephone Encounter (Signed)
Placed in red folder  

## 2021-02-17 ENCOUNTER — Other Ambulatory Visit: Payer: Self-pay | Admitting: Internal Medicine

## 2021-02-17 NOTE — Telephone Encounter (Signed)
Pharmacy says they have a script  on file to use on 03/20/21. The patient needs a script for the month of March. Per Pharmacy.

## 2021-02-18 ENCOUNTER — Other Ambulatory Visit: Payer: Self-pay | Admitting: Internal Medicine

## 2021-02-18 MED ORDER — OXYCODONE HCL ER 15 MG PO T12A: 15.0000 mg | EXTENDED_RELEASE_TABLET | Freq: Two times a day (BID) | ORAL | 0 refills | Status: DC

## 2021-02-18 NOTE — Addendum Note (Signed)
Addended by: [name] on: 02/18/2021 08:57 AM   Modules accepted: Orders

## 2021-02-18 NOTE — Telephone Encounter (Signed)
Form has been completed. LM for pt to call back to let him know that form has been completed and ready for pick up.

## 2021-02-18 NOTE — Telephone Encounter (Signed)
Pt is aware that paperwork has been completed and ready for pick up.

## 2021-02-27 ENCOUNTER — Other Ambulatory Visit: Payer: Self-pay | Admitting: Internal Medicine

## 2021-02-27 ENCOUNTER — Other Ambulatory Visit: Payer: Self-pay

## 2021-02-27 MED ORDER — HYDROCODONE-ACETAMINOPHEN 10-325 MG PO TABS: 1.0000 | ORAL_TABLET | ORAL | 0 refills | Status: DC | PRN

## 2021-03-03 DIAGNOSIS — M5417 Radiculopathy, lumbosacral region: Secondary | ICD-10-CM

## 2021-03-03 NOTE — Telephone Encounter (Signed)
Pt called in to get a referral for neuro surgery and spine-appt is tomorrow

## 2021-03-03 NOTE — Telephone Encounter (Signed)
Pt returned Azzell's call

## 2021-03-10 ENCOUNTER — Other Ambulatory Visit: Payer: Self-pay

## 2021-03-10 MED FILL — Gabapentin Cap 400 MG: ORAL | 30 days supply | Qty: 90 | Fill #0 | Status: AC

## 2021-03-10 MED FILL — Potassium Chloride Microencapsulated Crys ER Tab 20 mEq: ORAL | 30 days supply | Qty: 30 | Fill #0 | Status: AC

## 2021-03-11 ENCOUNTER — Other Ambulatory Visit: Payer: Self-pay

## 2021-03-11 MED FILL — Alprazolam Tab 0.25 MG: ORAL | 30 days supply | Qty: 30 | Fill #0 | Status: AC

## 2021-03-17 ENCOUNTER — Other Ambulatory Visit: Payer: Self-pay

## 2021-03-17 MED FILL — Oxycodone HCl Tab ER 12HR Deter 15 MG: ORAL | 30 days supply | Qty: 60 | Fill #0 | Status: CN

## 2021-03-19 ENCOUNTER — Other Ambulatory Visit: Payer: Self-pay

## 2021-03-19 MED FILL — Oxycodone HCl Tab ER 12HR Deter 15 MG: ORAL | 30 days supply | Qty: 60 | Fill #0 | Status: AC

## 2021-03-20 ENCOUNTER — Other Ambulatory Visit: Payer: Self-pay

## 2021-03-25 ENCOUNTER — Other Ambulatory Visit: Payer: Self-pay

## 2021-03-27 ENCOUNTER — Other Ambulatory Visit: Payer: Self-pay

## 2021-03-27 MED FILL — Hydrocodone-Acetaminophen Tab 10-325 MG: ORAL | 30 days supply | Qty: 180 | Fill #0 | Status: AC

## 2021-04-03 ENCOUNTER — Other Ambulatory Visit: Payer: Self-pay

## 2021-04-03 MED FILL — Pantoprazole Sodium EC Tab 40 MG (Base Equiv): ORAL | 90 days supply | Qty: 90 | Fill #0 | Status: AC

## 2021-04-09 ENCOUNTER — Other Ambulatory Visit: Payer: Self-pay | Admitting: Internal Medicine

## 2021-04-09 ENCOUNTER — Other Ambulatory Visit: Payer: Self-pay

## 2021-04-09 MED ORDER — ALPRAZOLAM 0.25 MG PO TABS
ORAL_TABLET | ORAL | 1 refills | Status: DC
  Filled 2021-04-09: qty 30, 30d supply, fill #0
  Filled 2021-05-12: qty 30, 30d supply, fill #1

## 2021-04-09 NOTE — Telephone Encounter (Signed)
RX Refill:xanax Last Seen:01-19-21 Last ordered:02-11-21

## 2021-04-11 ENCOUNTER — Other Ambulatory Visit: Payer: Self-pay | Admitting: Internal Medicine

## 2021-04-11 MED FILL — Potassium Chloride Microencapsulated Crys ER Tab 20 mEq: ORAL | 30 days supply | Qty: 30 | Fill #1 | Status: AC

## 2021-04-11 MED FILL — Duloxetine HCl Enteric Coated Pellets Cap 30 MG (Base Eq): ORAL | 90 days supply | Qty: 180 | Fill #0 | Status: AC

## 2021-04-13 ENCOUNTER — Other Ambulatory Visit: Payer: Self-pay

## 2021-04-13 MED ORDER — GABAPENTIN 400 MG PO CAPS
ORAL_CAPSULE | Freq: Three times a day (TID) | ORAL | 1 refills | Status: DC
  Filled 2021-04-13: qty 90, 30d supply, fill #0
  Filled 2021-05-07: qty 90, 30d supply, fill #1

## 2021-04-17 ENCOUNTER — Other Ambulatory Visit: Payer: Self-pay

## 2021-04-17 ENCOUNTER — Other Ambulatory Visit: Payer: Self-pay | Admitting: Internal Medicine

## 2021-04-17 MED FILL — Oxycodone HCl Tab ER 12HR Deter 15 MG: ORAL | 2 days supply | Qty: 4 | Fill #0 | Status: AC

## 2021-04-17 MED FILL — Oxycodone HCl Tab ER 12HR Deter 15 MG: ORAL | 2 days supply | Qty: 4 | Fill #0 | Status: CN

## 2021-04-17 NOTE — Telephone Encounter (Signed)
I sent in enough to cover until his PCP is back in the office. Controlled substance database reviewed. Please also find out if he has narcan on hand as that should be prescribed to use as well in the event that there is an inadvertent overdose.

## 2021-04-17 NOTE — Telephone Encounter (Signed)
Patient aware and has Narcan on hand to use.

## 2021-04-17 NOTE — Telephone Encounter (Signed)
Last refill 02/18/21 last OV 01/21/21 next visit scheduled for 04/20/21 patient will be out before Monday AM. Okay to fill

## 2021-04-20 ENCOUNTER — Telehealth (INDEPENDENT_AMBULATORY_CARE_PROVIDER_SITE_OTHER): Payer: No Typology Code available for payment source | Admitting: Internal Medicine

## 2021-04-20 ENCOUNTER — Other Ambulatory Visit: Payer: Self-pay

## 2021-04-20 ENCOUNTER — Encounter: Payer: Self-pay | Admitting: Internal Medicine

## 2021-04-20 DIAGNOSIS — M79604 Pain in right leg: Secondary | ICD-10-CM

## 2021-04-20 DIAGNOSIS — I10 Essential (primary) hypertension: Secondary | ICD-10-CM

## 2021-04-20 DIAGNOSIS — G8929 Other chronic pain: Secondary | ICD-10-CM

## 2021-04-20 DIAGNOSIS — M79605 Pain in left leg: Secondary | ICD-10-CM | POA: Diagnosis not present

## 2021-04-20 MED ORDER — OXYCODONE HCL ER 15 MG PO T12A
EXTENDED_RELEASE_TABLET | ORAL | 0 refills | Status: DC
  Filled 2021-04-20: qty 30, 15d supply, fill #0

## 2021-04-20 MED ORDER — HYDROCODONE-ACETAMINOPHEN 10-325 MG PO TABS: 1.0000 | ORAL_TABLET | Freq: Four times a day (QID) | ORAL | 0 refills | Status: DC | PRN

## 2021-04-20 MED ORDER — OXYCODONE HCL ER 15 MG PO T12A: 15.0000 mg | EXTENDED_RELEASE_TABLET | Freq: Two times a day (BID) | ORAL | 0 refills | Status: DC

## 2021-04-20 MED ORDER — HYDROCODONE-ACETAMINOPHEN 10-325 MG PO TABS
1.0000 | ORAL_TABLET | Freq: Four times a day (QID) | ORAL | 0 refills | Status: DC | PRN
  Filled 2021-04-27: qty 150, 30d supply, fill #0

## 2021-04-20 MED ORDER — HYDROCODONE-ACETAMINOPHEN 10-325 MG PO TABS
ORAL_TABLET | ORAL | 0 refills | Status: DC
  Filled 2021-05-27: qty 150, 30d supply, fill #0

## 2021-04-20 NOTE — Progress Notes (Signed)
Virtual Visit via [place] Note  This visit type was conducted due to national recommendations for restrictions regarding the COVID-19 pandemic (e.g. social distancing).  This format is felt to be most appropriate for this patient at this time.  All issues noted in this document were discussed and addressed.  No physical exam was performed (except for noted visual exam findings with Video Visits).   I connected with@ on 04/20/21 at  8:00 AM EDT by a video enabled telemedicine application and verified that I am speaking with the correct person using two identifiers. Location patient: home Location provider: work or home office Persons participating in the virtual visit: patient, provider  I discussed the limitations, risks, security and privacy concerns of performing an evaluation and management service by telephone and the availability of in person appointments. I also discussed with the patient that there may be a patient responsible charge related to this service. The patient expressed understanding and agreed to proceed.  Reason for visit: medication refill,  Follow up on obesity and hypertension   HPI:    Referred by Deri Fuelling to pain management for ESI.  Had his first one in the L2 -3 vertebrae (based on MRI findings in March 2022) ;  provided one week of relief.  next visit May 23. Still relying on oxycontin ER q 12 hours for pain management but using less Vicodin. Requests thtat qty be reduced from 180 to 150 per month Refill history confirmed via [place] Controlled Substance database, accessed by me today..  hypertension: Patient is taking amlodipine 10 mg and valsartan /hctz double dose dail as prescribed and notes no adverse effects.  Home BP readings have been done about once per week and are  generally < 140/80 He is avoiding added salt in her diet  Denies depressive symptoms   Has intentionally lost 20 lbs , by cutting out junk food. Appreciates the effect on his back pain   ROS:  See pertinent positives and negatives per HPI.  Past Medical History:  Diagnosis Date  . Anxiety    takes Valium as needed  . Arthritis   . Chronic back pain    spondylolisthesis  . Depression    takes Zoloft daily  . Hyperlipidemia    takes Atorvastatin daily  . Hypertension    takes Micardis and AMlodipine daily  . Joint pain   . Joint swelling   . Peripheral edema    takes Lasix daily  . Urinary frequency    takes Rapaflo daily  . Weakness    numbness and tingling    Past Surgical History:  Procedure Laterality Date  . BACK SURGERY  04/2016  . COLONOSCOPY    . TIBIA FRACTURE SURGERY Bilateral 1989   multiple surgeries   . TIBIA HARDWARE REMOVAL  1989    Family History  Problem Relation Age of Onset  . Cancer Mother        lung Ca, tobacco abuse  . CAD Mother        MI at age 27  . Stroke Father   . Hypertension Father   . Heart disease Father        pacemaker, heart failure  . Kidney disease Father   . Ulcerative colitis Father   . Cancer Sister 19       colon CA  . Cancer Other        colon CA at age 23  . Ulcerative colitis Brother     SOCIAL HX:  reports that  he quit smoking about 7 years ago. His smoking use included cigarettes. He started smoking about 43 years ago. He smoked 0.00 packs per day for 34.00 years. His smokeless tobacco use includes chew. He reports that he does not drink alcohol and does not use drugs.   Current Outpatient Medications:  .  ALPRAZolam (XANAX) 0.25 MG tablet, TAKE 1 TABLET BY MOUTH AT BEDTIME AS NEEDED FOR ANXIETY, Disp: 30 tablet, Rfl: 1 .  amLODipine (NORVASC) 10 MG tablet, TAKE 1 TABLET BY MOUTH DAILY, Disp: 90 tablet, Rfl: 1 .  atorvastatin (LIPITOR) 20 MG tablet, TAKE 1 TABLET BY MOUTH DAILY AT 6 PM., Disp: 90 tablet, Rfl: 1 .  clopidogrel (PLAVIX) 75 MG tablet, TAKE 1 TABLET BY MOUTH DAILY., Disp: 90 tablet, Rfl: 1 .  DULoxetine (CYMBALTA) 30 MG capsule, TAKE 1 CAPSULE BY MOUTH TWICE DAILY, Disp: 180 capsule, Rfl:  1 .  furosemide (LASIX) 20 MG tablet, Take 1 tablet (20 mg total) by mouth daily as needed., Disp: 90 tablet, Rfl: 2 .  gabapentin (NEURONTIN) 400 MG capsule, TAKE 1 CAPSULE BY MOUTH 3 TIMES DAILY, Disp: 90 capsule, Rfl: 1 .  pantoprazole (PROTONIX) 40 MG tablet, TAKE 1 TABLET BY MOUTH DAILY., Disp: 90 tablet, Rfl: 3 .  potassium chloride SA (KLOR-CON) 20 MEQ tablet, TAKE 1 TABLET BY MOUTH DAILY., Disp: 30 tablet, Rfl: 3 .  tiZANidine (ZANAFLEX) 4 MG tablet, Take by mouth., Disp: , Rfl:  .  valsartan-hydrochlorothiazide (DIOVAN-HCT) 80-12.5 MG tablet, TAKE 2 TABLETS BY MOUTH DAILY., Disp: 180 tablet, Rfl: 1 .  [START ON 06/25/2021] HYDROcodone-acetaminophen (NORCO) 10-325 MG tablet, Take 1-2 tablets by mouth every 6 (six) hours as needed for severe pain. MAXIMUM 5 DAILY, Disp: 150 tablet, Rfl: 0 .  [START ON 05/26/2021] HYDROcodone-acetaminophen (NORCO) 10-325 MG tablet, TAKE 1 TABLET BY MOUTH EVERY4 HOURS AS NEEDED., Disp: 150 tablet, Rfl: 0 .  [START ON 04/26/2021] HYDROcodone-acetaminophen (NORCO) 10-325 MG tablet, Take 1-2 tablets by mouth every 6 (six) hours as needed. maximum 5 daily, Disp: 150 tablet, Rfl: 0 .  [START ON 06/19/2021] oxyCODONE (OXYCONTIN) 15 mg 12 hr tablet, Take 1 tablet (15 mg total) by mouth every 12 (twelve) hours., Disp: 60 tablet, Rfl: 0 .  [START ON 05/20/2021] oxyCODONE (OXYCONTIN) 15 mg 12 hr tablet, Take 1 tablet (15 mg total) by mouth every 12 (twelve) hours., Disp: 60 tablet, Rfl: 0 .  oxyCODONE (OXYCONTIN) 15 mg 12 hr tablet, TAKE 1 TABLET BY MOUTH EVERY 12 (TWELVE) HOURS, Disp: 30 tablet, Rfl: 0  EXAM:  VITALS per patient if applicable:  GENERAL: alert, oriented, appears well and in no acute distress  HEENT: atraumatic, conjunttiva clear, no obvious abnormalities on inspection of external nose and ears  NECK: normal movements of the head and neck  LUNGS: on inspection no signs of respiratory distress, breathing rate appears normal, no obvious gross SOB, gasping  or wheezing  CV: no obvious cyanosis  MS: moves all visible extremities without noticeable abnormality  PSYCH/NEURO: pleasant and cooperative, no obvious depression or anxiety, speech and thought processing grossly intact  ASSESSMENT AND PLAN:  Discussed the following assessment and plan:  Primary hypertension  Chronic pain of both lower extremities  Morbid obesity (HCC)  Hypertension Well controlled on current regimen. Renal function stable, no changes today.   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  His Last ESI in May did not provide significant relief of pain.  Neurosurgical follow up  was done,  And ESI was given with transient results.  Continue  conservative approach (weight loss ,  ESI,  PT)   Refill history confirmed via [place] Controlled Substance databas, accessed by me today..  reduciing dose of hydrocodone to maximum 5 daily.    Morbid obesity ([place]) I have congratulated im reduction of   BMI and encouraged  Continued weight loss with goal of 10% of body weigh over the next 6 months using a low glycemic index diet and regular exercise a minimum of 5 days per week.      I discussed the assessment and treatment plan with the patient. The patient was provided an opportunity to ask questions and all were answered. The patient agreed with the plan and demonstrated an understanding of the instructions.   The patient was advised to call back or seek an in-person evaluation if the symptoms worsen or if the condition fails to improve as anticipated.   I spent 30 minutes dedicated to the care of this patient on the date of this encounter to include pre-visit review of his medical history,  Face-to-face time with the patient , and post visit ordering of testing and therapeutics.    [name], MD

## 2021-04-20 NOTE — Assessment & Plan Note (Signed)
I have congratulated im reduction of   BMI and encouraged  Continued weight loss with goal of 10% of body weigh over the next 6 months using a low glycemic index diet and regular exercise a minimum of 5 days per week.

## 2021-04-20 NOTE — Assessment & Plan Note (Signed)
Well controlled on current regimen. Renal function stable, no changes today. 

## 2021-04-20 NOTE — Assessment & Plan Note (Addendum)
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  His Last ESI in May did not provide significant relief of pain.  Neurosurgical follow up was done,  And ESI was given with transient results.  Continue  conservative approach (weight loss ,  ESI,  PT)   Refill history confirmed via [place] Controlled Substance databas, accessed by me today..  reduciing dose of hydrocodone to maximum 5 daily.

## 2021-04-24 ENCOUNTER — Other Ambulatory Visit: Payer: Self-pay | Admitting: Internal Medicine

## 2021-04-24 ENCOUNTER — Other Ambulatory Visit: Payer: Self-pay

## 2021-04-24 MED ORDER — VALSARTAN-HYDROCHLOROTHIAZIDE 80-12.5 MG PO TABS
2.0000 | ORAL_TABLET | Freq: Every day | ORAL | 1 refills | Status: DC
  Filled 2021-04-24: qty 180, 90d supply, fill #0
  Filled 2021-07-20: qty 180, 90d supply, fill #1

## 2021-04-24 MED FILL — Amlodipine Besylate Tab 10 MG (Base Equivalent): ORAL | 90 days supply | Qty: 90 | Fill #0 | Status: AC

## 2021-04-27 ENCOUNTER — Other Ambulatory Visit: Payer: Self-pay

## 2021-04-30 ENCOUNTER — Other Ambulatory Visit: Payer: Self-pay | Admitting: Internal Medicine

## 2021-04-30 ENCOUNTER — Other Ambulatory Visit: Payer: Self-pay

## 2021-04-30 NOTE — Telephone Encounter (Signed)
There is no evidence that the may 16 oxycontin refill has been filled on the [place] controlled substance database, which is unusual.  If, he has not filled it 10 days later I need to know.  MyChart message sent

## 2021-04-30 NOTE — Telephone Encounter (Signed)
Duplicate order it appears, can not deny. Please advise.

## 2021-05-01 ENCOUNTER — Other Ambulatory Visit: Payer: Self-pay

## 2021-05-05 ENCOUNTER — Other Ambulatory Visit: Payer: Self-pay | Admitting: Internal Medicine

## 2021-05-05 ENCOUNTER — Other Ambulatory Visit: Payer: Self-pay

## 2021-05-05 NOTE — Telephone Encounter (Signed)
Visit 04/20/21 script written for Oxycodone  For quantity of 30 and earliest refill date of next script not until 05/21/21,The quantity for May was 30 quantity should been 60 for 30 day supply okay to refill for 30. June 05/21/21 script is correct at 60 and July script correct at 85.

## 2021-05-06 ENCOUNTER — Other Ambulatory Visit: Payer: Self-pay

## 2021-05-06 MED FILL — Oxycodone HCl Tab ER 12HR Deter 15 MG: ORAL | 30 days supply | Qty: 60 | Fill #0 | Status: AC

## 2021-05-06 NOTE — Telephone Encounter (Signed)
Refilled for 30 days qty 60

## 2021-05-07 ENCOUNTER — Other Ambulatory Visit: Payer: Self-pay | Admitting: Internal Medicine

## 2021-05-07 ENCOUNTER — Other Ambulatory Visit: Payer: Self-pay

## 2021-05-07 MED ORDER — POTASSIUM CHLORIDE CRYS ER 20 MEQ PO TBCR
EXTENDED_RELEASE_TABLET | Freq: Every day | ORAL | 3 refills | Status: DC
  Filled 2021-05-07: qty 30, 30d supply, fill #0
  Filled 2021-06-14: qty 30, 30d supply, fill #1
  Filled 2021-07-20: qty 30, 30d supply, fill #2
  Filled 2021-08-17: qty 30, 30d supply, fill #3

## 2021-05-07 MED FILL — Atorvastatin Calcium Tab 20 MG (Base Equivalent): ORAL | 90 days supply | Qty: 90 | Fill #0 | Status: AC

## 2021-05-07 MED FILL — Clopidogrel Bisulfate Tab 75 MG (Base Equiv): ORAL | 90 days supply | Qty: 90 | Fill #0 | Status: AC

## 2021-05-08 ENCOUNTER — Other Ambulatory Visit: Payer: Self-pay

## 2021-05-13 ENCOUNTER — Other Ambulatory Visit: Payer: Self-pay

## 2021-05-24 ENCOUNTER — Other Ambulatory Visit: Payer: Self-pay | Admitting: Internal Medicine

## 2021-05-25 ENCOUNTER — Other Ambulatory Visit: Payer: Self-pay

## 2021-05-25 MED ORDER — HYDROCODONE-ACETAMINOPHEN 10-325 MG PO TABS
1.0000 | ORAL_TABLET | Freq: Four times a day (QID) | ORAL | 0 refills | Status: DC | PRN
  Filled 2021-06-25: qty 150, 30d supply, fill #0

## 2021-05-27 ENCOUNTER — Other Ambulatory Visit: Payer: Self-pay

## 2021-06-02 ENCOUNTER — Other Ambulatory Visit: Payer: Self-pay | Admitting: Internal Medicine

## 2021-06-03 ENCOUNTER — Other Ambulatory Visit: Payer: Self-pay

## 2021-06-03 ENCOUNTER — Other Ambulatory Visit: Payer: Self-pay | Admitting: Internal Medicine

## 2021-06-03 MED ORDER — OXYCODONE HCL ER 15 MG PO T12A
15.0000 mg | EXTENDED_RELEASE_TABLET | Freq: Two times a day (BID) | ORAL | 0 refills | Status: DC
  Filled 2021-06-05: qty 60, 30d supply, fill #0

## 2021-06-03 MED ORDER — GABAPENTIN 400 MG PO CAPS
ORAL_CAPSULE | Freq: Three times a day (TID) | ORAL | 1 refills | Status: DC
  Filled 2021-06-03: qty 90, 30d supply, fill #0
  Filled 2021-06-29: qty 90, 30d supply, fill #1

## 2021-06-03 MED ORDER — OXYCODONE HCL ER 15 MG PO T12A
15.0000 mg | EXTENDED_RELEASE_TABLET | Freq: Two times a day (BID) | ORAL | 0 refills | Status: DC
  Filled 2021-07-03: qty 60, 30d supply, fill #0
  Filled ????-??-??: fill #0

## 2021-06-03 NOTE — Telephone Encounter (Signed)
Oxycontin ER  has beenrefilled for July 1 and July 30

## 2021-06-05 ENCOUNTER — Other Ambulatory Visit: Payer: Self-pay

## 2021-06-11 ENCOUNTER — Other Ambulatory Visit: Payer: Self-pay | Admitting: Internal Medicine

## 2021-06-11 ENCOUNTER — Other Ambulatory Visit: Payer: Self-pay

## 2021-06-11 MED ORDER — ALPRAZOLAM 0.25 MG PO TABS: ORAL_TABLET | ORAL | 1 refills | Status: DC

## 2021-06-12 ENCOUNTER — Other Ambulatory Visit: Payer: Self-pay | Admitting: Internal Medicine

## 2021-06-12 ENCOUNTER — Other Ambulatory Visit: Payer: Self-pay

## 2021-06-12 MED ORDER — ALPRAZOLAM 0.25 MG PO TABS
0.2500 mg | ORAL_TABLET | Freq: Every evening | ORAL | 5 refills | Status: DC | PRN
  Filled 2021-06-12: qty 30, 30d supply, fill #0
  Filled 2021-07-20: qty 30, 30d supply, fill #1
  Filled 2021-08-19: qty 30, 30d supply, fill #2
  Filled 2021-09-17: qty 30, 30d supply, fill #3

## 2021-06-15 ENCOUNTER — Other Ambulatory Visit: Payer: Self-pay

## 2021-06-25 ENCOUNTER — Other Ambulatory Visit: Payer: Self-pay

## 2021-06-29 ENCOUNTER — Other Ambulatory Visit: Payer: Self-pay

## 2021-06-29 MED FILL — Pantoprazole Sodium EC Tab 40 MG (Base Equiv): ORAL | 90 days supply | Qty: 90 | Fill #1 | Status: AC

## 2021-07-03 ENCOUNTER — Other Ambulatory Visit: Payer: Self-pay

## 2021-07-04 ENCOUNTER — Other Ambulatory Visit: Payer: Self-pay | Admitting: Internal Medicine

## 2021-07-06 ENCOUNTER — Other Ambulatory Visit: Payer: Self-pay | Admitting: Internal Medicine

## 2021-07-06 ENCOUNTER — Other Ambulatory Visit: Payer: Self-pay

## 2021-07-06 MED FILL — Duloxetine HCl Enteric Coated Pellets Cap 30 MG (Base Eq): ORAL | 90 days supply | Qty: 180 | Fill #0 | Status: AC

## 2021-07-16 ENCOUNTER — Encounter: Payer: Self-pay | Admitting: Internal Medicine

## 2021-07-16 ENCOUNTER — Other Ambulatory Visit: Payer: Self-pay

## 2021-07-16 ENCOUNTER — Ambulatory Visit (INDEPENDENT_AMBULATORY_CARE_PROVIDER_SITE_OTHER): Payer: No Typology Code available for payment source

## 2021-07-16 ENCOUNTER — Ambulatory Visit (INDEPENDENT_AMBULATORY_CARE_PROVIDER_SITE_OTHER): Payer: No Typology Code available for payment source | Admitting: Internal Medicine

## 2021-07-16 VITALS — BP 142/80 | HR 78 | Temp 96.6°F | Resp 16 | Ht 70.0 in | Wt 239.0 lb

## 2021-07-16 DIAGNOSIS — M79605 Pain in left leg: Secondary | ICD-10-CM

## 2021-07-16 DIAGNOSIS — M25552 Pain in left hip: Secondary | ICD-10-CM

## 2021-07-16 DIAGNOSIS — G8929 Other chronic pain: Secondary | ICD-10-CM

## 2021-07-16 DIAGNOSIS — I10 Essential (primary) hypertension: Secondary | ICD-10-CM

## 2021-07-16 DIAGNOSIS — M79604 Pain in right leg: Secondary | ICD-10-CM | POA: Diagnosis not present

## 2021-07-16 LAB — COMPREHENSIVE METABOLIC PANEL
ALT: 11 U/L (ref 0–53)
AST: 13 U/L (ref 0–37)
Albumin: 4.1 g/dL (ref 3.5–5.2)
Alkaline Phosphatase: 74 U/L (ref 39–117)
BUN: 15 mg/dL (ref 6–23)
CO2: 31 mEq/L (ref 19–32)
Calcium: 9.9 mg/dL (ref 8.4–10.5)
Chloride: 101 mEq/L (ref 96–112)
Creatinine, Ser: 0.94 mg/dL (ref 0.40–1.50)
GFR: 86.7 mL/min (ref >60.00)
Glucose, Bld: 85 mg/dL (ref 70–99)
Potassium: 3.9 mEq/L (ref 3.5–5.1)
Sodium: 141 mEq/L (ref 135–145)
Total Bilirubin: 0.5 mg/dL (ref 0.2–1.2)
Total Protein: 6.6 g/dL (ref 6.0–8.3)

## 2021-07-16 MED ORDER — CLONIDINE HCL 0.1 MG PO TABS
0.1000 mg | ORAL_TABLET | Freq: Three times a day (TID) | ORAL | 3 refills | Status: DC
  Filled 2021-07-16: qty 90, 30d supply, fill #0
  Filled 2022-05-13 – 2022-05-17 (×2): qty 90, 30d supply, fill #1
  Filled 2022-07-05: qty 90, 30d supply, fill #2

## 2021-07-16 MED ORDER — PREDNISONE 10 MG PO TABS
ORAL_TABLET | ORAL | 0 refills | Status: DC
  Filled 2021-07-16: qty 42, 12d supply, fill #0

## 2021-07-16 MED ORDER — HYDROCODONE-ACETAMINOPHEN 10-325 MG PO TABS: 1.0000 | ORAL_TABLET | ORAL | 0 refills | Status: DC | PRN

## 2021-07-16 NOTE — Progress Notes (Signed)
Subjective:  Patient ID: Tyrone Mcdowell, male    DOB: [birth year]  Age: 65 y.o. MRN: [id number]  CC: The primary encounter diagnosis was Chronic hip pain, left. Diagnoses of Primary hypertension and Chronic pain of both lower extremities were also pertinent to this visit.  HPI Tyrone Mcdowell presents fo follow up on chronic back pain managed with long and short acting opioid therapy   This visit occurred during the SARS-CoV-2 public health emergency.  Safety protocols were in place, including screening questions prior to the visit, additional usage of staff PPE, and extensive cleaning of exam room while observing appropriate contact time as indicated for disinfecting solutions.   Tyrone Mcdowell is a 65 yr old male with chronic back pain despite prior lumbar decompressive surgery in 2017  with most recent MRI done March 2022 which was negative for spinal stenosis and migration of hardware.  He Is s/p 2nd ESI done August 1.  The second  ESI was more DIFFICULT a procedure PER PATIENT DUE TO ARTHRITIC CHANGES.  Unfortunately, he has not felt any relief this time at all, and his left  tibia has begun to ache . He would like to stop the oxycontin because he he doesn't feel it is helping anymore .   He has a history of withdrawal from hydrocodone several years ago when he decided to discontinue opioid therapy .    His Sister passed away in 17-Apr-2023 .  He has felt less stress since then.  Losing weight intentionally,  down 36 lbs since February . Eating healthier.     Htn:  Patient is taking his medications as prescribed and notes no adverse effects.  Home BP readings have been done about once per week and are  generally < 130/80 .  He is avoiding added salt in his diet.     Outpatient Medications Prior to Visit  Medication Sig Dispense Refill   ALPRAZolam (XANAX) 0.25 MG tablet Take 1 tablet (0.25 mg total) by mouth at bedtime as needed for anxiety. 30 tablet 5   amLODipine (NORVASC) 10 MG tablet TAKE 1  TABLET BY MOUTH DAILY 90 tablet 1   atorvastatin (LIPITOR) 20 MG tablet TAKE 1 TABLET BY MOUTH DAILY AT 6 PM. 90 tablet 1   clopidogrel (PLAVIX) 75 MG tablet TAKE 1 TABLET BY MOUTH DAILY. 90 tablet 1   DULoxetine (CYMBALTA) 30 MG capsule TAKE 1 CAPSULE BY MOUTH TWICE DAILY 180 capsule 1   furosemide (LASIX) 20 MG tablet Take 1 tablet (20 mg total) by mouth daily as needed. 90 tablet 2   gabapentin (NEURONTIN) 400 MG capsule Take by mouth 3 (three) times daily. 90 capsule 1   pantoprazole (PROTONIX) 40 MG tablet TAKE 1 TABLET BY MOUTH DAILY. 90 tablet 3   potassium chloride SA (KLOR-CON) 20 MEQ tablet TAKE 1 TABLET BY MOUTH DAILY. 30 tablet 3   tiZANidine (ZANAFLEX) 4 MG tablet Take by mouth.     valsartan-hydrochlorothiazide (DIOVAN-HCT) 80-12.5 MG tablet TAKE 2 TABLETS BY MOUTH DAILY. 180 tablet 1   HYDROcodone-acetaminophen (NORCO) 10-325 MG tablet Take 1-2 tablets by mouth every 6 (six) hours as needed for severe pain. MAXIMUM 5 DAILY 150 tablet 0   HYDROcodone-acetaminophen (NORCO) 10-325 MG tablet TAKE 1 TABLET BY MOUTH EVERY4 HOURS AS NEEDED. 150 tablet 0   HYDROcodone-acetaminophen (NORCO) 10-325 MG tablet Take 1-2 tablets by mouth every 6 (six) hours as needed. Maximum 5 tablets daily. 150 tablet 0   oxyCODONE (OXYCONTIN) 15 mg 12 hr  tablet Take 1 tablet (15 mg total) by mouth every 12 (twelve) hours. 60 tablet 0   oxyCODONE (OXYCONTIN) 15 mg 12 hr tablet Take 1 tablet (15 mg total) by mouth every 12 (twelve) hours. 60 tablet 0   No facility-administered medications prior to visit.    Review of Systems;  Patient denies headache, fevers, malaise, unintentional weight loss, skin rash, eye pain, sinus congestion and sinus pain, sore throat, dysphagia,  hemoptysis , cough, dyspnea, wheezing, chest pain, palpitations, orthopnea, edema, abdominal pain, nausea, melena, diarrhea, constipation, flank pain, dysuria, hematuria, urinary  Frequency, nocturia, numbness, tingling, seizures,  Focal  weakness, Loss of consciousness,  Tremor, insomnia, depression, anxiety, and suicidal ideation.      Objective:  BP (!) 142/80 (BP Location: Left Arm, Patient Position: Sitting, Cuff Size: Large)   Pulse 78   Temp (!) 96.6 F (35.9 C) (Temporal)   Resp 16   Ht '5\' 10"'$  (1.778 m)   Wt 239 lb (108.4 kg)   SpO2 98%   BMI 34.29 kg/m   BP Readings from Last 3 Encounters:  07/16/21 (!) 142/80  04/20/21 (!) 142/82  01/19/21 (!) 150/108    Wt Readings from Last 3 Encounters:  07/16/21 239 lb (108.4 kg)  04/20/21 255 lb (115.7 kg)  01/19/21 275 lb 3.2 oz (124.8 kg)    General appearance: alert, cooperative and appears stated age Ears: normal TM's and external ear canals both ears Throat: lips, mucosa, and tongue normal; teeth and gums normal Neck: no adenopathy, no carotid bruit, supple, symmetrical, trachea midline and thyroid not enlarged, symmetric, no tenderness/mass/nodules Back: symmetric, no curvature. ROM normal. No CVA tenderness. Lungs: clear to auscultation bilaterally Heart: regular rate and rhythm, S1, S2 normal, no murmur, click, rub or gallop Abdomen: soft, non-tender; bowel sounds normal; no masses,  no organomegaly Pulses: 2+ and symmetric Skin: Skin color, texture, turgor normal. No rashes or lesions Lymph nodes: Cervical, supraclavicular, and axillary nodes normal.  Lab Results  Component Value Date   HGBA1C 5.6 06/12/2020   HGBA1C 5.5 05/09/2018   HGBA1C 5.9 11/07/2017    Lab Results  Component Value Date   CREATININE 0.94 07/16/2021   CREATININE 0.99 12/11/2020   CREATININE 0.97 06/11/2020    Lab Results  Component Value Date   WBC 5.3 12/11/2020   HGB 13.8 12/11/2020   HCT 41.7 12/11/2020   PLT 260.0 12/11/2020   GLUCOSE 85 07/16/2021   CHOL 135 12/11/2020   TRIG 75.0 12/11/2020   HDL 45.20 12/11/2020   LDLDIRECT 142.8 10/23/2012   LDLCALC 75 12/11/2020   ALT 11 07/16/2021   AST 13 07/16/2021   NA 141 07/16/2021   K 3.9 07/16/2021   CL  101 07/16/2021   CREATININE 0.94 07/16/2021   BUN 15 07/16/2021   CO2 31 07/16/2021   TSH 1.21 11/07/2017   PSA 0.12 06/11/2020   INR 1.07 05/08/2018   HGBA1C 5.6 06/12/2020   MICROALBUR 3.8 (H) 06/11/2020    Tyrone Lumbar Spine W Wo Contrast  Result Date: 02/06/2021 CLINICAL DATA:  Low back pain with numbness and tingling in the left leg. EXAM: MRI LUMBAR SPINE WITHOUT AND WITH CONTRAST TECHNIQUE: Multiplanar and multiecho pulse sequences of the lumbar spine were obtained without and with intravenous contrast. CONTRAST:  81m GADAVIST GADOBUTROL 1 MMOL/ML IV SOLN COMPARISON:  01/18/2021.  02/20/2020. FINDINGS: Segmentation: 5 lumbar type vertebral bodies as numbered previously. Alignment:  Chronic fixed anterolisthesis at L5-S1 measuring 5-6 mm. Vertebrae:  No fracture or primary bone  lesion. Conus medullaris and cauda equina: Conus extends to the L1-2 level. Conus and cauda equina appear normal. Paraspinal and other soft tissues: Negative Disc levels: No abnormality at T10-11, T11-12 or T12-L1. L1-2: Minimal disc bulge.  No stenosis. L2-3: Minimal disc bulge. No stenosis. There is abutment of the spinous processes with mild edema and enhancement which could relate to back pain. L3-4: Minimal disc bulge.  No stenosis. L4-5: Mild bulging of the disc. Mild facet and ligamentous prominence. No compressive narrowing of the canal or foramina. L5-S1: Chronic fixed anterolisthesis of 5-6 mm. Posterior decompression, diskectomy and fusion. Pedicle screws and posterior rods in place. Apparent sufficient patency of the canal and foramina. No gross sign of arachnoiditis. IMPRESSION: 1. Good appearance at the decompression, diskectomy and fusion level of L5-S1. Chronic fixed anterolisthesis of 5-6 mm. 2. Mild non-compressive degenerative changes at L1-2, L2-3, L3-4 and L4-5. 3. Abutment with edema and enhancement between the spinous processes at L2-3 which could be associated with back pain. Electronically Signed   By:  Nelson Chimes M.D.   On: 02/06/2021 15:19    Assessment & Plan:   Problem List Items Addressed This Visit       Unprioritized   Hypertension    Adding clonidine prn SBP > 150      Relevant Medications   cloNIDine (CATAPRES) 0.1 MG tablet   Other Relevant Orders   Comprehensive metabolic panel (Completed)   Chronic leg pain    Pain has been uncontrolled by current regimen of oxycontin and hdyrocodone and failed to improved with ESI x 2 .  Discussed the possibility that his pain is uncontrolled due to tolerance to oxycontin.  Agree with stopping the medication.  Prednisone taper prescribed  continue hydrocodone prn        Relevant Medications   HYDROcodone-acetaminophen (NORCO) 10-325 MG tablet (Start on 07/20/2021)   predniSONE (DELTASONE) 10 MG tablet   Other Visit Diagnoses     Chronic hip pain, left    -  Primary   Relevant Medications   HYDROcodone-acetaminophen (NORCO) 10-325 MG tablet (Start on 07/20/2021)   predniSONE (DELTASONE) 10 MG tablet   Other Relevant Orders   DG Hip Unilat W OR W/O Pelvis 2-3 Views Left (Completed)       I spent 30 mintutes dedicated to the care of this patient on the date of this encounter to include pre-visit review of his medical history,   review of his lumbar MRI  Face-to-face time with the patient , and post visit ordering of testing and therapeutics.    Meds ordered this encounter  Medications   cloNIDine (CATAPRES) 0.1 MG tablet    Sig: Take 1 tablet (0.1 mg total) by mouth 3 (three) times daily. AS NEEDED FOR bp > 150/90    Dispense:  90 tablet    Refill:  3   HYDROcodone-acetaminophen (NORCO) 10-325 MG tablet    Sig: Take 1 tablet by mouth every 4 (four) hours as needed. TAKE 1 TABLET BY MOUTH EVERY4 HOURS AS NEEDED.    Dispense:  180 tablet    Refill:  0    MAXIMUM 5 DAILY   predniSONE (DELTASONE) 10 MG tablet    Sig: 6 tablets on Days 1-2,  then reduce by 1 tablet every 2 days until gone    Dispense:  42 tablet    Refill:   0    Medications Discontinued During This Encounter  Medication Reason   oxyCODONE (OXYCONTIN) 15 mg 12 hr tablet  oxyCODONE (OXYCONTIN) 15 mg 12 hr tablet    HYDROcodone-acetaminophen (NORCO) 10-325 MG tablet    HYDROcodone-acetaminophen (NORCO) 10-325 MG tablet    HYDROcodone-acetaminophen (NORCO) 10-325 MG tablet     Follow-up: Return in about 2 weeks (around 07/30/2021).   [name], MD

## 2021-07-16 NOTE — Patient Instructions (Signed)
Stop the oxycontin for 2 weeks  You may continue hydrocodone and increase dose to every 4 hours max 6 daily  New rx can be refilled on Monday   Prednisone 12 day taper also called in to use during oxy break  Clonidine: take this BP medication every 8 hours if needed for BP elevations of 150/90 or higher

## 2021-07-19 ENCOUNTER — Encounter: Payer: Self-pay | Admitting: Internal Medicine

## 2021-07-19 NOTE — Assessment & Plan Note (Signed)
Adding clonidine prn SBP > 150

## 2021-07-19 NOTE — Assessment & Plan Note (Signed)
Pain has been uncontrolled by current regimen of oxycontin and hdyrocodone and failed to improved with ESI x 2 .  Discussed the possibility that his pain is uncontrolled due to tolerance to oxycontin.  Agree with stopping the medication.  Prednisone taper prescribed  continue hydrocodone prn

## 2021-07-20 ENCOUNTER — Other Ambulatory Visit: Payer: Self-pay | Admitting: Internal Medicine

## 2021-07-20 ENCOUNTER — Other Ambulatory Visit: Payer: Self-pay

## 2021-07-20 ENCOUNTER — Telehealth: Payer: Self-pay | Admitting: Internal Medicine

## 2021-07-20 MED ORDER — AMLODIPINE BESYLATE 10 MG PO TABS
ORAL_TABLET | Freq: Every day | ORAL | 1 refills | Status: DC
  Filled 2021-07-20: qty 90, 90d supply, fill #0
  Filled 2021-10-18: qty 90, 90d supply, fill #1

## 2021-07-20 MED ORDER — HYDROCODONE-ACETAMINOPHEN 10-325 MG PO TABS
1.0000 | ORAL_TABLET | ORAL | 0 refills | Status: DC | PRN
  Filled 2021-07-20 – 2021-07-21 (×2): qty 180, 30d supply, fill #0

## 2021-07-20 NOTE — Telephone Encounter (Signed)
Patient stated [facility] needs clarification on the directions for his HYDROcodone-acetaminophen (NORCO) 10-325 MG tablet.They are unsure if needs 6 tablets a day or 5 tablets a day.

## 2021-07-21 ENCOUNTER — Other Ambulatory Visit: Payer: Self-pay

## 2021-08-02 ENCOUNTER — Other Ambulatory Visit: Payer: Self-pay | Admitting: Internal Medicine

## 2021-08-03 ENCOUNTER — Other Ambulatory Visit: Payer: Self-pay

## 2021-08-03 MED ORDER — GABAPENTIN 400 MG PO CAPS
400.0000 mg | ORAL_CAPSULE | Freq: Three times a day (TID) | ORAL | 1 refills | Status: DC
  Filled 2021-08-03: qty 90, 30d supply, fill #0
  Filled 2021-09-01: qty 90, 30d supply, fill #1

## 2021-08-04 ENCOUNTER — Other Ambulatory Visit: Payer: Self-pay

## 2021-08-04 MED ORDER — OXYCODONE HCL ER 15 MG PO T12A
EXTENDED_RELEASE_TABLET | ORAL | 0 refills | Status: DC
  Filled 2021-08-04: qty 60, 30d supply, fill #0

## 2021-08-14 ENCOUNTER — Other Ambulatory Visit: Payer: Self-pay | Admitting: Internal Medicine

## 2021-08-14 NOTE — Telephone Encounter (Signed)
RX Refill:Norco Last Seen:07-16-21 Last ordered:07-20-21

## 2021-08-17 ENCOUNTER — Other Ambulatory Visit: Payer: Self-pay

## 2021-08-17 ENCOUNTER — Other Ambulatory Visit: Payer: Self-pay | Admitting: Internal Medicine

## 2021-08-17 NOTE — Telephone Encounter (Signed)
RX Refill:Norco Last Seen:07-16-21 Last ordered:07-20-21

## 2021-08-18 ENCOUNTER — Other Ambulatory Visit: Payer: Self-pay | Admitting: Internal Medicine

## 2021-08-18 ENCOUNTER — Other Ambulatory Visit: Payer: Self-pay

## 2021-08-18 MED FILL — Clopidogrel Bisulfate Tab 75 MG (Base Equiv): ORAL | 90 days supply | Qty: 90 | Fill #0 | Status: AC

## 2021-08-18 MED FILL — Atorvastatin Calcium Tab 20 MG (Base Equivalent): ORAL | 90 days supply | Qty: 90 | Fill #0 | Status: AC

## 2021-08-18 MED FILL — Hydrocodone-Acetaminophen Tab 10-325 MG: ORAL | 30 days supply | Qty: 180 | Fill #0 | Status: AC

## 2021-08-19 ENCOUNTER — Other Ambulatory Visit: Payer: Self-pay

## 2021-08-20 ENCOUNTER — Telehealth: Payer: Self-pay | Admitting: Internal Medicine

## 2021-08-20 ENCOUNTER — Other Ambulatory Visit: Payer: Self-pay

## 2021-08-20 NOTE — Telephone Encounter (Signed)
Patient calling in and states that his wife received a call on his behalf and informed him to call our office back. Did not see anything new in Patient's chart.   Please advise

## 2021-08-20 NOTE — Telephone Encounter (Signed)
Spoke with pt to let him know that I was actually calling about his wife.

## 2021-08-28 ENCOUNTER — Other Ambulatory Visit: Payer: Self-pay

## 2021-08-28 ENCOUNTER — Other Ambulatory Visit: Payer: Self-pay | Admitting: Internal Medicine

## 2021-08-30 MED FILL — Oxycodone HCl Tab ER 12HR Deter 15 MG: ORAL | 30 days supply | Qty: 60 | Fill #0 | Status: CN

## 2021-08-31 ENCOUNTER — Other Ambulatory Visit: Payer: Self-pay

## 2021-09-01 ENCOUNTER — Other Ambulatory Visit: Payer: Self-pay

## 2021-09-01 MED FILL — Oxycodone HCl Tab ER 12HR Deter 15 MG: ORAL | 30 days supply | Qty: 60 | Fill #0 | Status: CN

## 2021-09-03 ENCOUNTER — Other Ambulatory Visit: Payer: Self-pay

## 2021-09-03 MED FILL — Oxycodone HCl Tab ER 12HR Deter 15 MG: ORAL | 30 days supply | Qty: 60 | Fill #0 | Status: AC

## 2021-09-14 ENCOUNTER — Other Ambulatory Visit: Payer: Self-pay

## 2021-09-14 ENCOUNTER — Other Ambulatory Visit: Payer: Self-pay | Admitting: Internal Medicine

## 2021-09-14 NOTE — Telephone Encounter (Signed)
RX Refill:Norco Last Seen:07-16-21 Last ordered:08-18-21

## 2021-09-15 ENCOUNTER — Other Ambulatory Visit: Payer: Self-pay

## 2021-09-15 MED FILL — Hydrocodone-Acetaminophen Tab 10-325 MG: ORAL | 30 days supply | Qty: 180 | Fill #0 | Status: CN

## 2021-09-17 ENCOUNTER — Other Ambulatory Visit: Payer: Self-pay | Admitting: Internal Medicine

## 2021-09-17 ENCOUNTER — Other Ambulatory Visit: Payer: Self-pay

## 2021-09-17 MED ORDER — POTASSIUM CHLORIDE CRYS ER 20 MEQ PO TBCR
EXTENDED_RELEASE_TABLET | Freq: Every day | ORAL | 3 refills | Status: DC
  Filled 2021-09-17: qty 30, 30d supply, fill #0
  Filled 2021-10-18: qty 30, 30d supply, fill #1
  Filled 2021-11-23: qty 30, 30d supply, fill #2
  Filled 2021-12-18: qty 30, 30d supply, fill #3

## 2021-09-18 ENCOUNTER — Other Ambulatory Visit: Payer: Self-pay

## 2021-09-18 ENCOUNTER — Telehealth: Payer: Self-pay | Admitting: Internal Medicine

## 2021-09-18 MED ORDER — HYDROCODONE-ACETAMINOPHEN 10-325 MG PO TABS
1.0000 | ORAL_TABLET | ORAL | 0 refills | Status: DC | PRN
  Filled 2021-09-18: qty 180, 30d supply, fill #0

## 2021-09-18 MED FILL — Hydrocodone-Acetaminophen Tab 10-325 MG: ORAL | 30 days supply | Qty: 180 | Fill #0 | Status: CN

## 2021-09-18 NOTE — Telephone Encounter (Signed)
Patient uses [facility] and they are closed on the weekends. The fill date for Patient's HYDROcodone-acetaminophen (NORCO) 10-325 MG tablet is tomorrow, Saturday.   Patient is wanting an okay given to the pharmacy for an early fill for today. Patient states he can not go all weekend without his medication.   Please advise

## 2021-09-18 NOTE — Telephone Encounter (Signed)
Spoke with Pam at [facility] and she stated that the medication has been refilled and the rx has been picked up.

## 2021-09-18 NOTE — Telephone Encounter (Signed)
Prescription resent for refill today. Please call pharmacy to confirm that I have authorized the early refill

## 2021-09-29 ENCOUNTER — Other Ambulatory Visit: Payer: Self-pay | Admitting: Internal Medicine

## 2021-09-29 ENCOUNTER — Other Ambulatory Visit: Payer: Self-pay

## 2021-09-29 MED ORDER — GABAPENTIN 400 MG PO CAPS
400.0000 mg | ORAL_CAPSULE | Freq: Three times a day (TID) | ORAL | 1 refills | Status: DC
  Filled 2021-09-29: qty 90, 30d supply, fill #0
  Filled 2021-10-23: qty 90, 30d supply, fill #1

## 2021-09-29 NOTE — Telephone Encounter (Signed)
RX Refill: oxycodone Last Seen: 07-16-21 Last Ordered: 08-30-21 Next Appt: NA

## 2021-09-30 ENCOUNTER — Other Ambulatory Visit: Payer: Self-pay | Admitting: Internal Medicine

## 2021-09-30 ENCOUNTER — Other Ambulatory Visit: Payer: Self-pay

## 2021-09-30 MED ORDER — OXYCODONE HCL ER 15 MG PO T12A
15.0000 mg | EXTENDED_RELEASE_TABLET | Freq: Two times a day (BID) | ORAL | 0 refills | Status: DC
  Filled 2021-09-30 – 2021-10-02 (×2): qty 60, 30d supply, fill #0

## 2021-10-02 ENCOUNTER — Other Ambulatory Visit: Payer: Self-pay

## 2021-10-07 ENCOUNTER — Other Ambulatory Visit: Payer: Self-pay

## 2021-10-07 MED FILL — Pantoprazole Sodium EC Tab 40 MG (Base Equiv): ORAL | 90 days supply | Qty: 90 | Fill #2 | Status: AC

## 2021-10-07 MED FILL — Duloxetine HCl Enteric Coated Pellets Cap 30 MG (Base Eq): ORAL | 90 days supply | Qty: 180 | Fill #1 | Status: AC

## 2021-10-14 ENCOUNTER — Other Ambulatory Visit: Payer: Self-pay | Admitting: Internal Medicine

## 2021-10-14 ENCOUNTER — Other Ambulatory Visit: Payer: Self-pay

## 2021-10-14 NOTE — Telephone Encounter (Signed)
Duplicate request. Unable to deny

## 2021-10-14 NOTE — Telephone Encounter (Signed)
RX Refill: norco Last Seen: 07-16-21 Last Ordered: 09-18-21 Next Appt: NA

## 2021-10-15 ENCOUNTER — Other Ambulatory Visit: Payer: Self-pay | Admitting: Internal Medicine

## 2021-10-15 ENCOUNTER — Other Ambulatory Visit: Payer: Self-pay

## 2021-10-15 MED ORDER — OXYCODONE HCL ER 15 MG PO T12A
15.0000 mg | EXTENDED_RELEASE_TABLET | Freq: Two times a day (BID) | ORAL | 0 refills | Status: DC
  Filled 2021-10-15 – 2021-11-02 (×2): qty 60, 30d supply, fill #0

## 2021-10-15 MED ORDER — HYDROCODONE-ACETAMINOPHEN 10-325 MG PO TABS
1.0000 | ORAL_TABLET | ORAL | 0 refills | Status: DC | PRN
  Filled 2021-10-15 – 2021-10-19 (×3): qty 180, 30d supply, fill #0

## 2021-10-15 NOTE — Telephone Encounter (Signed)
This patient was due for 3 month follpw up on narcotics refill this week.  I see nothing scheduled.  I have refilled the oxycontin and hydrocodone as a one time courtesu.

## 2021-10-15 NOTE — Telephone Encounter (Signed)
Duplicate request

## 2021-10-18 ENCOUNTER — Other Ambulatory Visit: Payer: Self-pay | Admitting: Internal Medicine

## 2021-10-19 ENCOUNTER — Other Ambulatory Visit: Payer: Self-pay

## 2021-10-19 MED ORDER — VALSARTAN-HYDROCHLOROTHIAZIDE 80-12.5 MG PO TABS
2.0000 | ORAL_TABLET | Freq: Every day | ORAL | 1 refills | Status: DC
  Filled 2021-10-19: qty 180, 90d supply, fill #0
  Filled 2022-01-19: qty 180, 90d supply, fill #1

## 2021-10-23 ENCOUNTER — Other Ambulatory Visit: Payer: Self-pay

## 2021-11-02 ENCOUNTER — Other Ambulatory Visit: Payer: Self-pay

## 2021-11-12 ENCOUNTER — Other Ambulatory Visit: Payer: Self-pay

## 2021-11-12 MED FILL — Clopidogrel Bisulfate Tab 75 MG (Base Equiv): ORAL | 90 days supply | Qty: 90 | Fill #1 | Status: AC

## 2021-11-12 MED FILL — Atorvastatin Calcium Tab 20 MG (Base Equivalent): ORAL | 90 days supply | Qty: 90 | Fill #1 | Status: AC

## 2021-11-15 ENCOUNTER — Other Ambulatory Visit: Payer: Self-pay | Admitting: Internal Medicine

## 2021-11-16 ENCOUNTER — Other Ambulatory Visit: Payer: Self-pay

## 2021-11-16 NOTE — Telephone Encounter (Signed)
Hydrocodone refill is Denied.  Has to be seen every 3 months,  last ov was august

## 2021-11-16 NOTE — Telephone Encounter (Signed)
RX Refill: Norco Last Seen: 07-16-21 Last Ordered: 10-15-21 Next Appt: NA

## 2021-11-17 ENCOUNTER — Other Ambulatory Visit: Payer: Self-pay | Admitting: Internal Medicine

## 2021-11-17 NOTE — Telephone Encounter (Signed)
RX Refill: Norco Last Seen: 07-16-21 Last Ordered: 10-15-21 Next Appt: NA

## 2021-11-18 ENCOUNTER — Other Ambulatory Visit: Payer: Self-pay

## 2021-11-19 ENCOUNTER — Other Ambulatory Visit: Payer: Self-pay

## 2021-11-19 ENCOUNTER — Other Ambulatory Visit: Payer: Self-pay | Admitting: Internal Medicine

## 2021-11-19 MED FILL — Hydrocodone-Acetaminophen Tab 10-325 MG: ORAL | 30 days supply | Qty: 180 | Fill #0 | Status: AC

## 2021-11-19 NOTE — Telephone Encounter (Signed)
RX Refill: norco Last Seen: 07-16-21 Last Ordered: 10-15-21 Next Appt: 12-04-21

## 2021-11-23 ENCOUNTER — Other Ambulatory Visit: Payer: Self-pay | Admitting: Internal Medicine

## 2021-11-23 ENCOUNTER — Other Ambulatory Visit: Payer: Self-pay

## 2021-11-23 MED ORDER — GABAPENTIN 400 MG PO CAPS
400.0000 mg | ORAL_CAPSULE | Freq: Three times a day (TID) | ORAL | 1 refills | Status: DC
  Filled 2021-11-23: qty 90, 30d supply, fill #0
  Filled 2021-12-18 – 2021-12-21 (×2): qty 90, 30d supply, fill #1

## 2021-11-30 ENCOUNTER — Other Ambulatory Visit: Payer: Self-pay

## 2021-11-30 ENCOUNTER — Other Ambulatory Visit: Payer: Self-pay | Admitting: Internal Medicine

## 2021-12-01 NOTE — Telephone Encounter (Signed)
Refilled: 10/15/2021 Last OV: 07/16/2021 Next OV: 12/04/2021

## 2021-12-02 ENCOUNTER — Other Ambulatory Visit: Payer: Self-pay

## 2021-12-04 ENCOUNTER — Encounter: Payer: Self-pay | Admitting: Internal Medicine

## 2021-12-04 ENCOUNTER — Telehealth (INDEPENDENT_AMBULATORY_CARE_PROVIDER_SITE_OTHER): Payer: No Typology Code available for payment source | Admitting: Internal Medicine

## 2021-12-04 ENCOUNTER — Other Ambulatory Visit: Payer: Self-pay

## 2021-12-04 ENCOUNTER — Other Ambulatory Visit: Payer: Self-pay | Admitting: Internal Medicine

## 2021-12-04 VITALS — BP 126/82 | Ht 70.0 in | Wt 217.0 lb

## 2021-12-04 DIAGNOSIS — I10 Essential (primary) hypertension: Secondary | ICD-10-CM | POA: Diagnosis not present

## 2021-12-04 DIAGNOSIS — E785 Hyperlipidemia, unspecified: Secondary | ICD-10-CM

## 2021-12-04 DIAGNOSIS — R5383 Other fatigue: Secondary | ICD-10-CM

## 2021-12-04 DIAGNOSIS — M4317 Spondylolisthesis, lumbosacral region: Secondary | ICD-10-CM

## 2021-12-04 DIAGNOSIS — R7301 Impaired fasting glucose: Secondary | ICD-10-CM | POA: Diagnosis not present

## 2021-12-04 DIAGNOSIS — Z125 Encounter for screening for malignant neoplasm of prostate: Secondary | ICD-10-CM

## 2021-12-04 DIAGNOSIS — K760 Fatty (change of) liver, not elsewhere classified: Secondary | ICD-10-CM

## 2021-12-04 MED ORDER — HYDROCODONE-ACETAMINOPHEN 10-325 MG PO TABS
1.0000 | ORAL_TABLET | ORAL | 0 refills | Status: DC | PRN
  Filled 2021-12-04: qty 180, 30d supply, fill #0

## 2021-12-04 MED ORDER — ALPRAZOLAM 0.25 MG PO TABS
0.2500 mg | ORAL_TABLET | Freq: Every evening | ORAL | 5 refills | Status: DC | PRN
  Filled 2021-12-04: qty 15, 30d supply, fill #0
  Filled 2022-01-04: qty 15, 30d supply, fill #1
  Filled 2022-02-06: qty 15, 30d supply, fill #2
  Filled 2022-04-06: qty 15, 30d supply, fill #3

## 2021-12-04 MED ORDER — HYDROCODONE-ACETAMINOPHEN 10-325 MG PO TABS
1.0000 | ORAL_TABLET | ORAL | 0 refills | Status: DC | PRN
  Filled 2021-12-04 – 2021-12-18 (×2): qty 180, 30d supply, fill #0

## 2021-12-04 MED ORDER — OXYCODONE HCL ER 15 MG PO T12A
15.0000 mg | EXTENDED_RELEASE_TABLET | Freq: Two times a day (BID) | ORAL | 0 refills | Status: DC
  Filled 2021-12-04: qty 60, 30d supply, fill #0

## 2021-12-04 NOTE — Telephone Encounter (Signed)
Refilled: 10/15/2021 Last OV: 07/16/2021 Next OV: today

## 2021-12-04 NOTE — Assessment & Plan Note (Addendum)
He reports increased pain and was advised to return to Dr Trenton Gammon for an [facility] .  Last one was April 26 and he had reported to complete relief of pain.  This will be needed in order to facilitate an opiate holiday which is needed due to his increased tolerance

## 2021-12-04 NOTE — Progress Notes (Signed)
Virtual Visit converted to Telephone Note  This visit type was conducted due to national recommendations for restrictions regarding the COVID-19 pandemic (e.g. social distancing).  This format is felt to be most appropriate for this patient at this time.  All issues noted in this document were discussed and addressed.  No physical exam was performed (except for noted visual exam findings with Video Visits).   I connected withNAME@ on 12/04/21 at  4:00 PM EST by telephone   and verified that I am speaking with the correct person using two identifiers. Location patient: home Location provider: work or home office Persons participating in the virtual visit: patient, provider  I discussed the limitations, risks, security and privacy concerns of performing an evaluation and management service by telephone and the availability of in person appointments. I also discussed with the patient that there may be a patient responsible charge related to this service. The patient expressed understanding and agreed to proceed.  Interactive audio and video telecommunications were attempted between this provider and patient, however failed, due to patient having technical difficulties   We continued and completed visit with audio only.  Reason for visit: medication refill , follow up on htn, chronic back pain   HPI:   65 yr old male with lumbar spondylosis,  S/p decompressive laminectomy , foraminotomies, and fusion in 2017, With progressive foraminal  stenosis L > R at L5-S1 level by repeat MRI  and chronic radicular pain  managed with kong and short acting opioids, last seen  August 11 presents for follow up.  . Patient's delay in follow up was due to 3 recent deaths In his family: his sister,  and 2 brothers in Sports [place].    Pain was not under control in August at that time on Oxycontin ER 15 mg bid and Hydrocodone 10/325 every 4 hours.  Opioid dependence/tolerance was discussed as the cause,  as there had been no  reinjury,  and  a suspension of the oxycontin was advised for 2 weeks  with permission to increase vicodin to every 4 hours .  At the end of the month he requested to resume the oxycontin .   He continues to have low back pain and right hip pain that is getting worse.  His pain is aggravated by the extremely cold weather .  Reviewed his last ESI done April 26 by [facility] which relieved his pain for 10-14  days.  Discussed the need for an opiate holiday which should be coordinated with his next [facility] which he has not scheduled yet, and advised him that he needs to schedule this prior to next visit .  Hypertension: patient checks blood pressure twice weekly at home.  Readings have been for the most part < 140/80 at rest . Patient is following a reduce salt diet most days and is taking medications as prescribed  (amlodipine and valsartan/hct). He has not had to use the clonidine except one time over the holidays.  Insomnia  using alprazolam not more than 3 times per week      ROS: See pertinent positives and negatives per HPI.  Past Medical History:  Diagnosis Date   Anxiety    takes Valium as needed   Arthritis    Chronic back pain    spondylolisthesis   Depression    takes Zoloft daily   Hyperlipidemia    takes Atorvastatin daily   Hypertension    takes Micardis and AMlodipine daily   Joint pain  Joint swelling    Peripheral edema    takes Lasix daily   Urinary frequency    takes Rapaflo daily   Weakness    numbness and tingling    Past Surgical History:  Procedure Laterality Date   BACK SURGERY  04/2016   COLONOSCOPY     TIBIA FRACTURE SURGERY Bilateral 1989   multiple surgeries    TIBIA HARDWARE REMOVAL  1989    Family History  Problem Relation Age of Onset   Cancer Mother        lung Ca, tobacco abuse   CAD Mother        MI at age 31   Stroke Father    Hypertension Father    Heart disease Father        pacemaker, heart failure   Kidney disease  Father    Ulcerative colitis Father    Cancer Sister 33       colon CA   Cancer Other        colon CA at age 24   Ulcerative colitis Brother     SOCIAL HX:  reports that he quit smoking about 8 years ago. His smoking use included cigarettes. He started smoking about 44 years ago. His smokeless tobacco use includes chew. He reports that he does not drink alcohol and does not use drugs.   Current Outpatient Medications:    amLODipine (NORVASC) 10 MG tablet, TAKE 1 TABLET BY MOUTH DAILY, Disp: 90 tablet, Rfl: 1   atorvastatin (LIPITOR) 20 MG tablet, TAKE 1 TABLET BY MOUTH DAILY AT 6 PM., Disp: 90 tablet, Rfl: 1   cloNIDine (CATAPRES) 0.1 MG tablet, Take 1 tablet (0.1 mg total) by mouth 3 (three) times daily. AS NEEDED FOR bp > 150/90, Disp: 90 tablet, Rfl: 3   clopidogrel (PLAVIX) 75 MG tablet, TAKE 1 TABLET BY MOUTH DAILY., Disp: 90 tablet, Rfl: 1   DULoxetine (CYMBALTA) 30 MG capsule, TAKE 1 CAPSULE BY MOUTH TWICE DAILY, Disp: 180 capsule, Rfl: 1   furosemide (LASIX) 20 MG tablet, Take 1 tablet (20 mg total) by mouth daily as needed., Disp: 90 tablet, Rfl: 2   gabapentin (NEURONTIN) 400 MG capsule, Take 1 capsule (400 mg total) by mouth 3 (three) times daily., Disp: 90 capsule, Rfl: 1   pantoprazole (PROTONIX) 40 MG tablet, TAKE 1 TABLET BY MOUTH DAILY., Disp: 90 tablet, Rfl: 3   potassium chloride SA (KLOR-CON M) 20 MEQ tablet, TAKE 1 TABLET BY MOUTH DAILY., Disp: 30 tablet, Rfl: 3   tiZANidine (ZANAFLEX) 4 MG tablet, Take by mouth., Disp: , Rfl:    valsartan-hydrochlorothiazide (DIOVAN-HCT) 80-12.5 MG tablet, TAKE 2 TABLETS BY MOUTH DAILY., Disp: 180 tablet, Rfl: 1   ALPRAZolam (XANAX) 0.25 MG tablet, Take 1 tablet (0.25 mg total) by mouth at bedtime as needed for anxiety. Not more than 3 times per week, Disp: 15 tablet, Rfl: 5   HYDROcodone-acetaminophen (NORCO) 10-325 MG tablet, Take 1 tablet by mouth every 4 (four) hours as needed., Disp: 180 tablet, Rfl: 0   oxyCODONE (OXYCONTIN) 15 mg  12 hr tablet, Take 1 tablet (15 mg total) by mouth every 12 (twelve) hours., Disp: 60 tablet, Rfl: 0  EXAM:   General impression: alert, cooperative and articulate.  No signs of being in distress  Lungs: speech is fluent sentence length suggests that patient is not short of breath and not punctuated by cough, sneezing or sniffing. [name]   Psych: affect normal.  speech is articulate and non pressured .  Denies suicidal thoughts    ASSESSMENT AND PLAN:  Discussed the following assessment and plan:  Primary hypertension - Plan: Comprehensive metabolic panel, Microalbumin / creatinine urine ratio  Hyperlipidemia LDL goal <100 - Plan: Lipid panel  Impaired fasting glucose - Plan: Comprehensive metabolic panel, Hemoglobin A1c  Other fatigue - Plan: CBC with Differential/Platelet  Spondylolisthesis at L5-S1 level  Prostate cancer screening - Plan: PSA  Fatty liver  Spondylolisthesis at L5-S1 level He reports increased pain and was advised to return to Dr Trenton Gammon for an [facility] .  Last one was April 26 and he had reported to complete relief of pain.  This will be needed in order to facilitate an opiate holiday which is needed due to his increased tolerance  Fatty liver Presumed by ultrasound changes and serologies negative for autoimmune causes of hepatitis.  Current transaminases are normal and all modifiable risk factors including obesity  and hyperlipidemia have been addressed .  Lab Results  Component Value Date   ALT 11 07/16/2021   AST 13 07/16/2021   ALKPHOS 74 07/16/2021   BILITOT 0.5 07/16/2021     Hypertension Well controlled on current regimen of amlodipine 10 mg and valsartan/hct  . Renal function sis normal, no changes today.  Lab Results  Component Value Date   CREATININE 0.94 07/16/2021   Lab Results  Component Value Date   NA 141 07/16/2021   K 3.9 07/16/2021   CL 101 07/16/2021   CO2 31 07/16/2021      I discussed the assessment and treatment plan with the  patient. The patient was provided an opportunity to ask questions and all were answered. The patient agreed with the plan and demonstrated an understanding of the instructions.   The patient was advised to call back or seek an in-person evaluation if the symptoms worsen or if the condition fails to improve as anticipated.   I spent 20 minutes dedicated to the care of this patient on the date of this encounter to include pre-visit review of his medical history,  non Face-to-face time with the patient , and post visit ordering of testing and therapeutics.    [name], MD

## 2021-12-04 NOTE — Patient Instructions (Signed)
Please schedule an  appointment with Dr Trenton Gammon  for an epidural steroid injection so we can coordinate this with a drug holiday to reduce your opiate dependence   Fasting labs are due in January,  please schedule

## 2021-12-05 NOTE — Assessment & Plan Note (Signed)
Well controlled on current regimen of amlodipine 10 mg and valsartan/hct  . Renal function sis normal, no changes today.  Lab Results  Component Value Date   CREATININE 0.94 07/16/2021   Lab Results  Component Value Date   NA 141 07/16/2021   K 3.9 07/16/2021   CL 101 07/16/2021   CO2 31 07/16/2021

## 2021-12-05 NOTE — Assessment & Plan Note (Signed)
Presumed by ultrasound changes and serologies negative for autoimmune causes of hepatitis.  Current transaminases are normal and all modifiable risk factors including obesity  and hyperlipidemia have been addressed .  Lab Results  Component Value Date   ALT 11 07/16/2021   AST 13 07/16/2021   ALKPHOS 74 07/16/2021   BILITOT 0.5 07/16/2021

## 2021-12-18 ENCOUNTER — Other Ambulatory Visit: Payer: Self-pay

## 2021-12-21 ENCOUNTER — Other Ambulatory Visit: Payer: Self-pay

## 2021-12-30 ENCOUNTER — Encounter: Payer: Self-pay | Admitting: Internal Medicine

## 2021-12-30 ENCOUNTER — Other Ambulatory Visit: Payer: Self-pay | Admitting: Internal Medicine

## 2021-12-31 ENCOUNTER — Other Ambulatory Visit: Payer: Self-pay

## 2021-12-31 NOTE — Telephone Encounter (Signed)
Refilled: 12/04/2021 Last OV: 12/04/2021 virtual Next OV: not scheduled

## 2022-01-01 ENCOUNTER — Other Ambulatory Visit: Payer: Self-pay | Admitting: Internal Medicine

## 2022-01-01 ENCOUNTER — Other Ambulatory Visit: Payer: Self-pay

## 2022-01-01 MED ORDER — HYDROCODONE-ACETAMINOPHEN 10-325 MG PO TABS
1.0000 | ORAL_TABLET | Freq: Four times a day (QID) | ORAL | 0 refills | Status: DC | PRN
  Filled 2022-01-01 – 2022-01-20 (×2): qty 120, 30d supply, fill #0

## 2022-01-01 MED ORDER — OXYCODONE HCL ER 10 MG PO T12A
10.0000 mg | EXTENDED_RELEASE_TABLET | Freq: Two times a day (BID) | ORAL | 0 refills | Status: DC
  Filled 2022-01-01 – 2022-01-04 (×2): qty 60, 30d supply, fill #0

## 2022-01-04 ENCOUNTER — Other Ambulatory Visit: Payer: Self-pay

## 2022-01-04 ENCOUNTER — Other Ambulatory Visit (INDEPENDENT_AMBULATORY_CARE_PROVIDER_SITE_OTHER): Payer: No Typology Code available for payment source

## 2022-01-04 DIAGNOSIS — R7301 Impaired fasting glucose: Secondary | ICD-10-CM

## 2022-01-04 DIAGNOSIS — I10 Essential (primary) hypertension: Secondary | ICD-10-CM | POA: Diagnosis not present

## 2022-01-04 DIAGNOSIS — R5383 Other fatigue: Secondary | ICD-10-CM | POA: Diagnosis not present

## 2022-01-04 DIAGNOSIS — Z125 Encounter for screening for malignant neoplasm of prostate: Secondary | ICD-10-CM

## 2022-01-04 DIAGNOSIS — E785 Hyperlipidemia, unspecified: Secondary | ICD-10-CM | POA: Diagnosis not present

## 2022-01-04 LAB — COMPREHENSIVE METABOLIC PANEL
ALT: 11 U/L (ref 0–53)
AST: 13 U/L (ref 0–37)
Albumin: 4.2 g/dL (ref 3.5–5.2)
Alkaline Phosphatase: 71 U/L (ref 39–117)
BUN: 15 mg/dL (ref 6–23)
CO2: 30 mEq/L (ref 19–32)
Calcium: 9.7 mg/dL (ref 8.4–10.5)
Chloride: 97 mEq/L (ref 96–112)
Creatinine, Ser: 0.94 mg/dL (ref 0.40–1.50)
GFR: 86.41 mL/min (ref >60.00)
Glucose, Bld: 76 mg/dL (ref 70–99)
Potassium: 3.4 mEq/L — ABNORMAL LOW (ref 3.5–5.1)
Sodium: 137 mEq/L (ref 135–145)
Total Bilirubin: 0.7 mg/dL (ref 0.2–1.2)
Total Protein: 6.6 g/dL (ref 6.0–8.3)

## 2022-01-04 LAB — CBC WITH DIFFERENTIAL/PLATELET
Basophils Absolute: 0 10*3/uL (ref 0.0–0.1)
Basophils Relative: 0.5 % (ref 0.0–3.0)
Eosinophils Absolute: 0.3 10*3/uL (ref 0.0–0.7)
Eosinophils Relative: 4.7 % (ref 0.0–5.0)
HCT: 42.1 % (ref 39.0–52.0)
Hemoglobin: 13.9 g/dL (ref 13.0–17.0)
Lymphocytes Relative: 36.3 % (ref 12.0–46.0)
Lymphs Abs: 2.3 10*3/uL (ref 0.7–4.0)
MCHC: 33.1 g/dL (ref 30.0–36.0)
MCV: 94.3 fl (ref 78.0–100.0)
Monocytes Absolute: 0.6 10*3/uL (ref 0.1–1.0)
Monocytes Relative: 8.8 % (ref 3.0–12.0)
Neutro Abs: 3.2 10*3/uL (ref 1.4–7.7)
Neutrophils Relative %: 49.7 % (ref 43.0–77.0)
Platelets: 283 10*3/uL (ref 150.0–400.0)
RBC: 4.46 Mil/uL (ref 4.22–5.81)
RDW: 13.7 % (ref 11.5–15.5)
WBC: 6.4 10*3/uL (ref 4.0–10.5)

## 2022-01-04 LAB — LIPID PANEL
Cholesterol: 138 mg/dL (ref 0–200)
HDL: 45.1 mg/dL (ref >39.00)
LDL Cholesterol: 78 mg/dL (ref 0–99)
NonHDL: 92.52
Total CHOL/HDL Ratio: 3
Triglycerides: 75 mg/dL (ref 0.0–149.0)
VLDL: 15 mg/dL (ref 0.0–40.0)

## 2022-01-04 LAB — MICROALBUMIN / CREATININE URINE RATIO
Creatinine,U: 44.8 mg/dL
Microalb Creat Ratio: 1.6 mg/g (ref 0.0–30.0)
Microalb, Ur: <0.7 mg/dL (ref 0.0–1.9)

## 2022-01-04 LAB — PSA: PSA: 0.17 ng/mL (ref 0.10–4.00)

## 2022-01-04 LAB — HEMOGLOBIN A1C: Hgb A1c MFr Bld: 5.6 % (ref 4.6–6.5)

## 2022-01-04 NOTE — Telephone Encounter (Signed)
For your information  

## 2022-01-05 ENCOUNTER — Other Ambulatory Visit: Payer: Self-pay

## 2022-01-05 MED ORDER — POTASSIUM CHLORIDE CRYS ER 20 MEQ PO TBCR
EXTENDED_RELEASE_TABLET | ORAL | 3 refills | Status: DC
  Filled 2022-01-05: qty 100, 50d supply, fill #0
  Filled 2022-04-21: qty 100, 50d supply, fill #1
  Filled 2022-06-13: qty 100, 50d supply, fill #2
  Filled 2022-07-29: qty 100, 50d supply, fill #3

## 2022-01-05 NOTE — Addendum Note (Signed)
Addended by: [name] on: 01/05/2022 08:58 AM   Modules accepted: Orders

## 2022-01-05 NOTE — Telephone Encounter (Signed)
The refill on oxycontin 15 mg that was on file has been correctly not filled by the pharmacy.  The dose has been reduced to 10 mg and that prescription was sent in for Jan 27 but has not been refilled yet/

## 2022-01-09 ENCOUNTER — Other Ambulatory Visit: Payer: Self-pay | Admitting: Internal Medicine

## 2022-01-11 ENCOUNTER — Other Ambulatory Visit: Payer: Self-pay

## 2022-01-11 MED ORDER — DULOXETINE HCL 30 MG PO CPEP
ORAL_CAPSULE | Freq: Two times a day (BID) | ORAL | 1 refills | Status: DC
  Filled 2022-01-11: qty 180, 90d supply, fill #0
  Filled 2022-04-06: qty 180, 90d supply, fill #1

## 2022-01-11 MED ORDER — PANTOPRAZOLE SODIUM 40 MG PO TBEC
DELAYED_RELEASE_TABLET | Freq: Every day | ORAL | 3 refills | Status: DC
  Filled 2022-01-11: qty 90, 90d supply, fill #0
  Filled 2022-04-06: qty 90, 90d supply, fill #1
  Filled 2022-07-05: qty 90, 90d supply, fill #2
  Filled 2022-10-01: qty 90, 90d supply, fill #3

## 2022-01-19 ENCOUNTER — Other Ambulatory Visit: Payer: Self-pay | Admitting: Internal Medicine

## 2022-01-20 ENCOUNTER — Other Ambulatory Visit: Payer: Self-pay

## 2022-01-20 MED ORDER — GABAPENTIN 400 MG PO CAPS
400.0000 mg | ORAL_CAPSULE | Freq: Three times a day (TID) | ORAL | 1 refills | Status: DC
  Filled 2022-01-20: qty 90, 30d supply, fill #0

## 2022-01-20 MED ORDER — AMLODIPINE BESYLATE 10 MG PO TABS
ORAL_TABLET | Freq: Every day | ORAL | 1 refills | Status: DC
  Filled 2022-01-20: qty 90, 90d supply, fill #0
  Filled 2022-04-21: qty 90, 90d supply, fill #1

## 2022-01-28 ENCOUNTER — Encounter: Payer: Self-pay | Admitting: Internal Medicine

## 2022-01-30 ENCOUNTER — Other Ambulatory Visit: Payer: Self-pay | Admitting: Internal Medicine

## 2022-02-01 ENCOUNTER — Other Ambulatory Visit: Payer: Self-pay

## 2022-02-02 ENCOUNTER — Telehealth: Payer: Self-pay

## 2022-02-02 ENCOUNTER — Other Ambulatory Visit: Payer: Self-pay

## 2022-02-02 MED ORDER — OXYCODONE HCL ER 10 MG PO T12A
10.0000 mg | EXTENDED_RELEASE_TABLET | Freq: Two times a day (BID) | ORAL | 0 refills | Status: DC
  Filled 2022-02-02 – 2022-02-04 (×2): qty 60, 30d supply, fill #0

## 2022-02-02 NOTE — Telephone Encounter (Signed)
Msg was left for pt 02/01/2022                    02/02/2022  Mychart msg sent to pt 02/02/2022   PT NEEDS F/U FOR FURTHER MEDICATION FILLS

## 2022-02-04 ENCOUNTER — Other Ambulatory Visit: Payer: Self-pay

## 2022-02-05 ENCOUNTER — Other Ambulatory Visit: Payer: Self-pay

## 2022-02-08 ENCOUNTER — Other Ambulatory Visit: Payer: Self-pay

## 2022-02-12 ENCOUNTER — Ambulatory Visit: Payer: No Typology Code available for payment source | Admitting: Internal Medicine

## 2022-02-16 ENCOUNTER — Other Ambulatory Visit: Payer: Self-pay

## 2022-02-16 ENCOUNTER — Ambulatory Visit (INDEPENDENT_AMBULATORY_CARE_PROVIDER_SITE_OTHER): Payer: No Typology Code available for payment source | Admitting: Internal Medicine

## 2022-02-16 ENCOUNTER — Ambulatory Visit (INDEPENDENT_AMBULATORY_CARE_PROVIDER_SITE_OTHER): Payer: No Typology Code available for payment source

## 2022-02-16 ENCOUNTER — Encounter: Payer: Self-pay | Admitting: Internal Medicine

## 2022-02-16 VITALS — BP 110/68 | HR 46 | Temp 98.6°F | Ht 70.0 in | Wt 229.8 lb

## 2022-02-16 DIAGNOSIS — J441 Chronic obstructive pulmonary disease with (acute) exacerbation: Secondary | ICD-10-CM

## 2022-02-16 DIAGNOSIS — J449 Chronic obstructive pulmonary disease, unspecified: Secondary | ICD-10-CM | POA: Insufficient documentation

## 2022-02-16 DIAGNOSIS — E876 Hypokalemia: Secondary | ICD-10-CM | POA: Diagnosis not present

## 2022-02-16 DIAGNOSIS — M5417 Radiculopathy, lumbosacral region: Secondary | ICD-10-CM

## 2022-02-16 DIAGNOSIS — J9601 Acute respiratory failure with hypoxia: Secondary | ICD-10-CM

## 2022-02-16 DIAGNOSIS — D649 Anemia, unspecified: Secondary | ICD-10-CM

## 2022-02-16 DIAGNOSIS — J432 Centrilobular emphysema: Secondary | ICD-10-CM | POA: Insufficient documentation

## 2022-02-16 DIAGNOSIS — J9602 Acute respiratory failure with hypercapnia: Secondary | ICD-10-CM

## 2022-02-16 LAB — BASIC METABOLIC PANEL
BUN: 12 mg/dL (ref 6–23)
CO2: 34 mEq/L — ABNORMAL HIGH (ref 19–32)
Calcium: 9.5 mg/dL (ref 8.4–10.5)
Chloride: 98 mEq/L (ref 96–112)
Creatinine, Ser: 0.87 mg/dL (ref 0.40–1.50)
GFR: 91.9 mL/min (ref >60.00)
Glucose, Bld: 68 mg/dL — ABNORMAL LOW (ref 70–99)
Potassium: 3.6 mEq/L (ref 3.5–5.1)
Sodium: 138 mEq/L (ref 135–145)

## 2022-02-16 LAB — CBC WITH DIFFERENTIAL/PLATELET
Basophils Absolute: 0.1 10*3/uL (ref 0.0–0.1)
Basophils Relative: 0.6 % (ref 0.0–3.0)
Eosinophils Absolute: 0.3 10*3/uL (ref 0.0–0.7)
Eosinophils Relative: 3.5 % (ref 0.0–5.0)
HCT: 37.7 % — ABNORMAL LOW (ref 39.0–52.0)
Hemoglobin: 12.8 g/dL — ABNORMAL LOW (ref 13.0–17.0)
Lymphocytes Relative: 27.1 % (ref 12.0–46.0)
Lymphs Abs: 2.2 10*3/uL (ref 0.7–4.0)
MCHC: 34.1 g/dL (ref 30.0–36.0)
MCV: 94.5 fl (ref 78.0–100.0)
Monocytes Absolute: 0.6 10*3/uL (ref 0.1–1.0)
Monocytes Relative: 7.1 % (ref 3.0–12.0)
Neutro Abs: 5 10*3/uL (ref 1.4–7.7)
Neutrophils Relative %: 61.7 % (ref 43.0–77.0)
Platelets: 304 10*3/uL (ref 150.0–400.0)
RBC: 3.99 Mil/uL — ABNORMAL LOW (ref 4.22–5.81)
RDW: 13.9 % (ref 11.5–15.5)
WBC: 8.2 10*3/uL (ref 4.0–10.5)

## 2022-02-16 MED ORDER — PREDNISONE 10 MG PO TABS
ORAL_TABLET | ORAL | 0 refills | Status: DC
  Filled 2022-02-16: qty 33, 8d supply, fill #0

## 2022-02-16 MED ORDER — MONTELUKAST SODIUM 10 MG PO TABS
10.0000 mg | ORAL_TABLET | Freq: Every day | ORAL | 3 refills | Status: DC
  Filled 2022-02-16: qty 30, 30d supply, fill #0
  Filled 2022-03-18: qty 30, 30d supply, fill #1
  Filled 2022-04-21: qty 30, 30d supply, fill #2
  Filled 2022-05-18: qty 30, 30d supply, fill #3

## 2022-02-16 MED ORDER — ALBUTEROL SULFATE HFA 108 (90 BASE) MCG/ACT IN AERS
2.0000 | INHALATION_SPRAY | Freq: Four times a day (QID) | RESPIRATORY_TRACT | 11 refills | Status: AC | PRN
  Filled 2022-02-16: qty 8.5, 25d supply, fill #0
  Filled 2022-05-14: qty 8.5, 25d supply, fill #1
  Filled 2022-06-10: qty 8.5, 25d supply, fill #2
  Filled 2022-07-19: qty 8.5, 25d supply, fill #3

## 2022-02-16 MED ORDER — TIZANIDINE HCL 4 MG PO TABS
4.0000 mg | ORAL_TABLET | Freq: Three times a day (TID) | ORAL | 1 refills | Status: DC
  Filled 2022-02-16: qty 90, 30d supply, fill #0
  Filled 2022-05-13: qty 90, 30d supply, fill #1

## 2022-02-16 MED ORDER — GABAPENTIN 300 MG PO CAPS
600.0000 mg | ORAL_CAPSULE | Freq: Three times a day (TID) | ORAL | 1 refills | Status: DC
  Filled 2022-02-16: qty 90, 15d supply, fill #0
  Filled 2022-03-07: qty 90, 15d supply, fill #1

## 2022-02-16 MED ORDER — HYDROCODONE-ACETAMINOPHEN 10-325 MG PO TABS
1.0000 | ORAL_TABLET | Freq: Four times a day (QID) | ORAL | 0 refills | Status: DC | PRN
  Filled 2022-02-16: qty 180, 23d supply, fill #0
  Filled 2022-02-19: qty 180, 22d supply, fill #0

## 2022-02-16 MED ORDER — ALBUTEROL SULFATE (2.5 MG/3ML) 0.083% IN NEBU
2.5000 mg | INHALATION_SOLUTION | Freq: Once | RESPIRATORY_TRACT | Status: AC
  Administered 2022-02-16: 2.5 mg via RESPIRATORY_TRACT

## 2022-02-16 MED ORDER — METHYLPREDNISOLONE ACETATE 40 MG/ML IJ SUSP
40.0000 mg | Freq: Once | INTRAMUSCULAR | Status: AC
  Administered 2022-02-16: 40 mg via INTRAMUSCULAR

## 2022-02-16 MED ORDER — DOXYCYCLINE HYCLATE 100 MG PO TABS
100.0000 mg | ORAL_TABLET | Freq: Two times a day (BID) | ORAL | 0 refills | Status: DC
  Filled 2022-02-16: qty 20, 10d supply, fill #0

## 2022-02-16 NOTE — Assessment & Plan Note (Signed)
Plans for ESI delayed by insurance requiring PT>  Will resume hydrocorone for breatthrough pain at higher frequency /dose due to increased right hip pain .  I have  refilled at 180/month,  Increase gabapentin to 600 mg tid.  ?

## 2022-02-16 NOTE — Patient Instructions (Addendum)
?  You are having a COPD exacerbation  ?I am treating you  with the following: ? ?Prednisone tapering dose for the next 6 days   ?DOXYCYCLINE TWCIE DAILY WITH FOOD FOR 10 DAYS  (antibiotic) DEPENDING ON THE RESULT OF YOUR CHEST  X RAY , I MAY ADD A SECOND ONE ?Use your albuterol inhaler as needed ?Take Delsym for cough  ?START TAKING AN ANTIHISTAMINE DAILY ALONG WITH SINGULAIR  ? ?[facility]'s sinus rinse can be used daily to flush sinuses (use sterile or bottled water ,  Not tap)  ? ? ?

## 2022-02-16 NOTE — Progress Notes (Signed)
Subjective:  Patient ID: Tyrone Mcdowell, male    DOB: 02/09/1956  Age: 66 y.o. MRN: [id number]  CC: The primary encounter diagnosis was COPD exacerbation (HCC). Diagnoses of COPD with acute exacerbation (HCC), Radiculitis, lumbosacral, and Hypokalemia were also pertinent to this visit.   This visit occurred during the SARS-CoV-2 public health emergency.  Safety protocols were in place, including screening questions prior to the visit, additional usage of staff PPE, and extensive cleaning of exam room while observing appropriate contact time as indicated for disinfecting solutions.    HPI Tyrone Mcdowell presents for  Chief Complaint  Patient presents with   Follow-up    Follow up for medication refills   1) chronic pain;  left hip has become more painful  and he has not been able to received the anticipated ESI's because he has not had PT> His insurance will not approve the ESI  in the lumbar spine .  He has to go through physical therapy before the [facility] will be approved.  Orthopedics ([facility] ) is managing the referral for PT>  hydrocodone rx was reduced several months ago to #120/month in anticipation of ESI and he is now feeling more pain and becoming less active due to the hip pain   2 ) wheezing:  started with the pollen season.  Cough productive of clear sputum    Outpatient Medications Prior to Visit  Medication Sig Dispense Refill   ALPRAZolam (XANAX) 0.25 MG tablet Take 1 tablet (0.25 mg total) by mouth at bedtime as needed for anxiety. Not more than 3 times per week 15 tablet 5   amLODipine (NORVASC) 10 MG tablet TAKE 1 TABLET BY MOUTH DAILY 90 tablet 1   atorvastatin (LIPITOR) 20 MG tablet TAKE 1 TABLET BY MOUTH DAILY AT 6 PM. 90 tablet 1   cloNIDine (CATAPRES) 0.1 MG tablet Take 1 tablet (0.1 mg total) by mouth 3 (three) times daily. AS NEEDED FOR bp > 150/90 90 tablet 3   clopidogrel (PLAVIX) 75 MG tablet TAKE 1 TABLET BY MOUTH DAILY. 90 tablet 1   DULoxetine (CYMBALTA) 30  MG capsule TAKE 1 CAPSULE BY MOUTH TWICE DAILY 180 capsule 1   furosemide (LASIX) 20 MG tablet Take 1 tablet (20 mg total) by mouth daily as needed. 90 tablet 2   oxyCODONE (OXYCONTIN) 10 mg 12 hr tablet Take 1 tablet (10 mg total) by mouth every 12 (twelve) hours. 60 tablet 0   pantoprazole (PROTONIX) 40 MG tablet TAKE 1 TABLET BY MOUTH DAILY. 90 tablet 3   potassium chloride SA (KLOR-CON M) 20 MEQ tablet One tablet daily ,  one extra daily as needed 100 tablet 3   valsartan-hydrochlorothiazide (DIOVAN-HCT) 80-12.5 MG tablet TAKE 2 TABLETS BY MOUTH DAILY. 180 tablet 1   gabapentin (NEURONTIN) 400 MG capsule Take 1 capsule (400 mg total) by mouth 3 (three) times daily. 90 capsule 1   HYDROcodone-acetaminophen (NORCO) 10-325 MG tablet Take 1 tablet by mouth every 6 (six) hours as needed. 120 tablet 0   tiZANidine (ZANAFLEX) 4 MG tablet Take by mouth.     No facility-administered medications prior to visit.    Review of Systems;  Patient denies headache, fevers, malaise, unintentional weight loss, skin rash, eye pain, sinus congestion and sinus pain, sore throat, dysphagia,  hemoptysis ,  chest pain, palpitations, orthopnea, edema, abdominal pain, nausea, melena, diarrhea, constipation, flank pain, dysuria, hematuria, urinary  Frequency, nocturia, numbness, tingling, seizures,  Focal weakness, Loss of consciousness,  Tremor, insomnia, depression,  anxiety, and suicidal ideation.      Objective:  BP 110/68 (BP Location: Left Arm, Patient Position: Sitting, Cuff Size: Large)   Pulse (!) 46   Temp 98.6 F (37 C) (Oral)   Ht 5\' 10"  (1.778 m)   Wt 229 lb 12.8 oz (104.2 kg)   SpO2 99%   BMI 32.97 kg/m   BP Readings from Last 3 Encounters:  02/16/22 110/68  12/04/21 126/82  07/16/21 (!) 142/80    Wt Readings from Last 3 Encounters:  02/16/22 229 lb 12.8 oz (104.2 kg)  12/04/21 217 lb (98.4 kg)  07/16/21 239 lb (108.4 kg)    General appearance: alert, cooperative and appears stated  age Ears: normal TM's and external ear canals both ears Throat: lips, mucosa, and tongue normal; teeth and gums normal Neck: no adenopathy, no carotid bruit, supple, symmetrical, trachea midline and thyroid not enlarged, symmetric, no tenderness/mass/nodules Back: symmetric, no curvature. ROM normal. No CVA tenderness. Lungs:  bilateral wheezing and ronchi with Fair air movement  Heart: regular rate and rhythm, S1, S2 normal, no murmur, click, rub or gallop Abdomen: soft, non-tender; bowel sounds normal; no masses,  no organomegaly Pulses: 2+ and symmetric Skin: Skin color, texture, turgor normal. No rashes or lesions Lymph nodes: Cervical, supraclavicular, and axillary nodes normal.  Lab Results  Component Value Date   HGBA1C 5.6 01/04/2022   HGBA1C 5.6 06/12/2020   HGBA1C 5.5 05/09/2018    Lab Results  Component Value Date   CREATININE 0.94 01/04/2022   CREATININE 0.94 07/16/2021   CREATININE 0.99 12/11/2020    Lab Results  Component Value Date   WBC 6.4 01/04/2022   HGB 13.9 01/04/2022   HCT 42.1 01/04/2022   PLT 283.0 01/04/2022   GLUCOSE 76 01/04/2022   CHOL 138 01/04/2022   TRIG 75.0 01/04/2022   HDL 45.10 01/04/2022   LDLDIRECT 142.8 10/23/2012   LDLCALC 78 01/04/2022   ALT 11 01/04/2022   AST 13 01/04/2022   NA 137 01/04/2022   K 3.4 (L) 01/04/2022   CL 97 01/04/2022   CREATININE 0.94 01/04/2022   BUN 15 01/04/2022   CO2 30 01/04/2022   TSH 1.21 11/07/2017   PSA 0.17 01/04/2022   INR 1.07 05/08/2018   HGBA1C 5.6 01/04/2022   MICROALBUR <0.7 01/04/2022    MR Lumbar Spine W Wo Contrast  Result Date: 02/06/2021 CLINICAL DATA:  Low back pain with numbness and tingling in the left leg. EXAM: MRI LUMBAR SPINE WITHOUT AND WITH CONTRAST TECHNIQUE: Multiplanar and multiecho pulse sequences of the lumbar spine were obtained without and with intravenous contrast. CONTRAST:  10mL GADAVIST GADOBUTROL 1 MMOL/ML IV SOLN COMPARISON:  01/18/2021.  02/20/2020. FINDINGS:  Segmentation: 5 lumbar type vertebral bodies as numbered previously. Alignment:  Chronic fixed anterolisthesis at L5-S1 measuring 5-6 mm. Vertebrae:  No fracture or primary bone lesion. Conus medullaris and cauda equina: Conus extends to the L1-2 level. Conus and cauda equina appear normal. Paraspinal and other soft tissues: Negative Disc levels: No abnormality at T10-11, T11-12 or T12-L1. L1-2: Minimal disc bulge.  No stenosis. L2-3: Minimal disc bulge. No stenosis. There is abutment of the spinous processes with mild edema and enhancement which could relate to back pain. L3-4: Minimal disc bulge.  No stenosis. L4-5: Mild bulging of the disc. Mild facet and ligamentous prominence. No compressive narrowing of the canal or foramina. L5-S1: Chronic fixed anterolisthesis of 5-6 mm. Posterior decompression, diskectomy and fusion. Pedicle screws and posterior rods in place. Apparent sufficient patency  of the canal and foramina. No gross sign of arachnoiditis. IMPRESSION: 1. Good appearance at the decompression, diskectomy and fusion level of L5-S1. Chronic fixed anterolisthesis of 5-6 mm. 2. Mild non-compressive degenerative changes at L1-2, L2-3, L3-4 and L4-5. 3. Abutment with edema and enhancement between the spinous processes at L2-3 which could be associated with back pain. Electronically Signed   By: Paulina Fusi M.D.   On: 02/06/2021 15:19    Assessment & Plan:   Problem List Items Addressed This Visit     Radiculitis, lumbosacral    Plans for ESI delayed by insurance requiring PT>  Will resume hydrocorone for breatthrough pain at higher frequency /dose due to increased right hip pain .  I have  refilled at 180/month,  Increase gabapentin to 600 mg tid.       Relevant Medications   tiZANidine (ZANAFLEX) 4 MG tablet   gabapentin (NEURONTIN) 300 MG capsule   COPD with acute exacerbation (HCC)    He was given an albuterl neb in the office with no change in lung exam.  Not dyspneic at rest. Chest x ray  ordered.  Steroids,  Nebs given in office,  Steroid taper doxycycline and albuterol mdi prescribed will add 2nd abx depending on chest x ray       Relevant Medications   predniSONE (DELTASONE) 10 MG tablet   albuterol (VENTOLIN HFA) 108 (90 Base) MCG/ACT inhaler   montelukast (SINGULAIR) 10 MG tablet   Other Visit Diagnoses     COPD exacerbation (HCC)    -  Primary   Relevant Medications   predniSONE (DELTASONE) 10 MG tablet   albuterol (VENTOLIN HFA) 108 (90 Base) MCG/ACT inhaler   methylPREDNISolone acetate (DEPO-MEDROL) injection 40 mg (Completed)   albuterol (PROVENTIL) (2.5 MG/3ML) 0.083% nebulizer solution 2.5 mg (Completed)   montelukast (SINGULAIR) 10 MG tablet   Other Relevant Orders   DG Chest 2 View   CBC with Differential/Platelet   Hypokalemia       Relevant Orders   Basic metabolic panel       I spent 30 minutes dedicated to the care of this patient on the date of this encounter to include pre-visit review of patient's medical history,  most recent imaging studies, Face-to-face time with the patient , and post visit ordering of testing and therapeutics.    Follow-up: No follow-ups on file.   Sherlene Shams, MD

## 2022-02-16 NOTE — Assessment & Plan Note (Addendum)
He was given an albuterl neb in the office with no change in lung exam.  Not dyspneic at rest. Chest x ray ordered.  Steroids,  Nebs given in office,  Steroid taper doxycycline and albuterol mdi prescribed will add 2nd abx depending on chest x ray  ?

## 2022-02-17 ENCOUNTER — Other Ambulatory Visit: Payer: Self-pay

## 2022-02-17 ENCOUNTER — Other Ambulatory Visit: Payer: Self-pay | Admitting: Internal Medicine

## 2022-02-17 MED FILL — Atorvastatin Calcium Tab 20 MG (Base Equivalent): ORAL | 90 days supply | Qty: 90 | Fill #0 | Status: AC

## 2022-02-17 MED FILL — Clopidogrel Bisulfate Tab 75 MG (Base Equiv): ORAL | 90 days supply | Qty: 90 | Fill #0 | Status: AC

## 2022-02-18 NOTE — Addendum Note (Signed)
Addended by: [name] on: 02/18/2022 05:20 PM ? ? Modules accepted: Orders ? ?

## 2022-02-19 ENCOUNTER — Other Ambulatory Visit: Payer: Self-pay

## 2022-03-01 ENCOUNTER — Other Ambulatory Visit: Payer: Self-pay

## 2022-03-01 ENCOUNTER — Other Ambulatory Visit: Payer: Self-pay | Admitting: Internal Medicine

## 2022-03-01 MED ORDER — OXYCODONE HCL ER 10 MG PO T12A
10.0000 mg | EXTENDED_RELEASE_TABLET | Freq: Two times a day (BID) | ORAL | 0 refills | Status: DC
  Filled 2022-03-01 – 2022-03-08 (×3): qty 60, 30d supply, fill #0

## 2022-03-05 ENCOUNTER — Other Ambulatory Visit: Payer: Self-pay

## 2022-03-08 ENCOUNTER — Other Ambulatory Visit: Payer: Self-pay

## 2022-03-09 ENCOUNTER — Other Ambulatory Visit: Payer: Self-pay

## 2022-03-16 ENCOUNTER — Other Ambulatory Visit: Payer: Self-pay

## 2022-03-16 ENCOUNTER — Other Ambulatory Visit: Payer: Self-pay | Admitting: Internal Medicine

## 2022-03-17 ENCOUNTER — Other Ambulatory Visit: Payer: Self-pay

## 2022-03-17 MED FILL — Hydrocodone-Acetaminophen Tab 10-325 MG: ORAL | 23 days supply | Qty: 180 | Fill #0 | Status: CN

## 2022-03-18 ENCOUNTER — Other Ambulatory Visit: Payer: Self-pay | Admitting: Internal Medicine

## 2022-03-18 ENCOUNTER — Other Ambulatory Visit: Payer: Self-pay

## 2022-03-18 MED FILL — Hydrocodone-Acetaminophen Tab 10-325 MG: ORAL | 23 days supply | Qty: 180 | Fill #0 | Status: CN

## 2022-03-19 ENCOUNTER — Other Ambulatory Visit: Payer: Self-pay

## 2022-03-19 MED ORDER — GABAPENTIN 300 MG PO CAPS
600.0000 mg | ORAL_CAPSULE | Freq: Three times a day (TID) | ORAL | 1 refills | Status: DC
  Filled 2022-03-19: qty 90, 15d supply, fill #0
  Filled 2022-04-06: qty 90, 15d supply, fill #1

## 2022-03-19 MED FILL — Hydrocodone-Acetaminophen Tab 10-325 MG: ORAL | 30 days supply | Qty: 180 | Fill #0 | Status: AC

## 2022-03-22 ENCOUNTER — Other Ambulatory Visit: Payer: Self-pay

## 2022-03-24 ENCOUNTER — Other Ambulatory Visit: Payer: Self-pay

## 2022-04-04 ENCOUNTER — Other Ambulatory Visit: Payer: Self-pay | Admitting: Internal Medicine

## 2022-04-05 ENCOUNTER — Other Ambulatory Visit: Payer: Self-pay

## 2022-04-05 MED ORDER — OXYCODONE HCL ER 10 MG PO T12A
10.0000 mg | EXTENDED_RELEASE_TABLET | Freq: Two times a day (BID) | ORAL | 0 refills | Status: DC
  Filled 2022-04-05 – 2022-04-08 (×2): qty 60, 30d supply, fill #0

## 2022-04-06 ENCOUNTER — Other Ambulatory Visit: Payer: Self-pay

## 2022-04-08 ENCOUNTER — Other Ambulatory Visit: Payer: Self-pay

## 2022-04-14 ENCOUNTER — Other Ambulatory Visit: Payer: Self-pay | Admitting: Internal Medicine

## 2022-04-14 ENCOUNTER — Other Ambulatory Visit: Payer: Self-pay

## 2022-04-15 ENCOUNTER — Other Ambulatory Visit: Payer: Self-pay

## 2022-04-15 MED FILL — Hydrocodone-Acetaminophen Tab 10-325 MG: ORAL | 30 days supply | Qty: 180 | Fill #0 | Status: AC

## 2022-04-16 ENCOUNTER — Other Ambulatory Visit: Payer: Self-pay

## 2022-04-21 ENCOUNTER — Other Ambulatory Visit: Payer: Self-pay | Admitting: Internal Medicine

## 2022-04-22 ENCOUNTER — Other Ambulatory Visit: Payer: Self-pay

## 2022-04-22 ENCOUNTER — Other Ambulatory Visit: Payer: Self-pay | Admitting: Internal Medicine

## 2022-04-22 MED FILL — Gabapentin Cap 300 MG: ORAL | 15 days supply | Qty: 90 | Fill #0 | Status: AC

## 2022-04-22 MED FILL — Valsartan-Hydrochlorothiazide Tab 80-12.5 MG: ORAL | 90 days supply | Qty: 180 | Fill #0 | Status: AC

## 2022-04-29 ENCOUNTER — Other Ambulatory Visit: Payer: Self-pay

## 2022-04-29 ENCOUNTER — Other Ambulatory Visit: Payer: Self-pay | Admitting: Internal Medicine

## 2022-04-29 MED FILL — Oxycodone HCl Tab ER 12HR Deter 10 MG: ORAL | 30 days supply | Qty: 60 | Fill #0 | Status: CN

## 2022-04-29 NOTE — Telephone Encounter (Signed)
MyChart message sent  re OV needed   Requested Prescriptions   Signed Prescriptions Disp Refills   OXYCONTIN 10 MG 12 hr tablet 60 tablet 0    Sig: Take 1 tablet (10 mg total) by mouth every 12 (twelve) hours.    Authorizing Provider: Leslee Home

## 2022-04-29 NOTE — Telephone Encounter (Signed)
Refilled: 04/05/2022 Last OV: 02/16/2022 Next OV: not scheduled

## 2022-04-30 ENCOUNTER — Telehealth: Payer: Self-pay | Admitting: Internal Medicine

## 2022-04-30 NOTE — Telephone Encounter (Signed)
Call pt Rec UC so that wound can evaluated, cleaned and ensure no infection They should have td as well

## 2022-04-30 NOTE — Telephone Encounter (Signed)
Pt need to get a Tdap shot because he was tangled up in bob wire while cleaning up his mom house

## 2022-04-30 NOTE — Telephone Encounter (Signed)
Can pt go to the pharmacy to get the tdap injection or should he go to UC?

## 2022-04-30 NOTE — Telephone Encounter (Signed)
Pt gave a verbal understanding.

## 2022-05-05 ENCOUNTER — Other Ambulatory Visit: Payer: Self-pay

## 2022-05-09 MED FILL — Gabapentin Cap 300 MG: ORAL | 15 days supply | Qty: 90 | Fill #1 | Status: AC

## 2022-05-10 ENCOUNTER — Other Ambulatory Visit: Payer: Self-pay

## 2022-05-10 MED FILL — Oxycodone HCl Tab ER 12HR Deter 10 MG: ORAL | 30 days supply | Qty: 60 | Fill #0 | Status: AC

## 2022-05-12 ENCOUNTER — Other Ambulatory Visit: Payer: Self-pay | Admitting: Internal Medicine

## 2022-05-13 ENCOUNTER — Ambulatory Visit (INDEPENDENT_AMBULATORY_CARE_PROVIDER_SITE_OTHER): Payer: No Typology Code available for payment source | Admitting: Internal Medicine

## 2022-05-13 ENCOUNTER — Other Ambulatory Visit: Payer: Self-pay

## 2022-05-13 ENCOUNTER — Other Ambulatory Visit: Payer: Self-pay | Admitting: Internal Medicine

## 2022-05-13 ENCOUNTER — Encounter: Payer: Self-pay | Admitting: Internal Medicine

## 2022-05-13 VITALS — BP 124/80 | HR 61 | Temp 98.2°F | Ht 70.0 in | Wt 222.4 lb

## 2022-05-13 DIAGNOSIS — Z800 Family history of malignant neoplasm of digestive organs: Secondary | ICD-10-CM

## 2022-05-13 DIAGNOSIS — J449 Chronic obstructive pulmonary disease, unspecified: Secondary | ICD-10-CM

## 2022-05-13 DIAGNOSIS — I679 Cerebrovascular disease, unspecified: Secondary | ICD-10-CM | POA: Diagnosis not present

## 2022-05-13 DIAGNOSIS — M5417 Radiculopathy, lumbosacral region: Secondary | ICD-10-CM

## 2022-05-13 DIAGNOSIS — D649 Anemia, unspecified: Secondary | ICD-10-CM | POA: Diagnosis not present

## 2022-05-13 DIAGNOSIS — E611 Iron deficiency: Secondary | ICD-10-CM

## 2022-05-13 DIAGNOSIS — K760 Fatty (change of) liver, not elsewhere classified: Secondary | ICD-10-CM | POA: Diagnosis not present

## 2022-05-13 LAB — COMPREHENSIVE METABOLIC PANEL
ALT: 34 U/L (ref 0–53)
AST: 37 U/L (ref 0–37)
Albumin: 4 g/dL (ref 3.5–5.2)
Alkaline Phosphatase: 104 U/L (ref 39–117)
BUN: 10 mg/dL (ref 6–23)
CO2: 32 mEq/L (ref 19–32)
Calcium: 9.4 mg/dL (ref 8.4–10.5)
Chloride: 102 mEq/L (ref 96–112)
Creatinine, Ser: 0.75 mg/dL (ref 0.40–1.50)
GFR: 95.95 mL/min (ref >60.00)
Glucose, Bld: 98 mg/dL (ref 70–99)
Potassium: 3.8 mEq/L (ref 3.5–5.1)
Sodium: 141 mEq/L (ref 135–145)
Total Bilirubin: 0.3 mg/dL (ref 0.2–1.2)
Total Protein: 6.6 g/dL (ref 6.0–8.3)

## 2022-05-13 LAB — IBC + FERRITIN
Ferritin: 21 ng/mL — ABNORMAL LOW (ref 22.0–322.0)
Iron: 51 ug/dL (ref 42–165)
Saturation Ratios: 14.7 % — ABNORMAL LOW (ref 20.0–50.0)
TIBC: 345.8 ug/dL (ref 250.0–450.0)
Transferrin: 247 mg/dL (ref 212.0–360.0)

## 2022-05-13 LAB — CBC WITH DIFFERENTIAL/PLATELET
Basophils Absolute: 0 10*3/uL (ref 0.0–0.1)
Basophils Relative: 0.8 % (ref 0.0–3.0)
Eosinophils Absolute: 0.2 10*3/uL (ref 0.0–0.7)
Eosinophils Relative: 4.3 % (ref 0.0–5.0)
HCT: 40.6 % (ref 39.0–52.0)
Hemoglobin: 13.5 g/dL (ref 13.0–17.0)
Lymphocytes Relative: 34.8 % (ref 12.0–46.0)
Lymphs Abs: 1.5 10*3/uL (ref 0.7–4.0)
MCHC: 33.2 g/dL (ref 30.0–36.0)
MCV: 96.3 fl (ref 78.0–100.0)
Monocytes Absolute: 0.4 10*3/uL (ref 0.1–1.0)
Monocytes Relative: 9.2 % (ref 3.0–12.0)
Neutro Abs: 2.3 10*3/uL (ref 1.4–7.7)
Neutrophils Relative %: 50.9 % (ref 43.0–77.0)
Platelets: 279 10*3/uL (ref 150.0–400.0)
RBC: 4.21 Mil/uL — ABNORMAL LOW (ref 4.22–5.81)
RDW: 14.2 % (ref 11.5–15.5)
WBC: 4.4 10*3/uL (ref 4.0–10.5)

## 2022-05-13 LAB — B12 AND FOLATE PANEL
Folate: 9.7 ng/mL (ref >5.9)
Vitamin B-12: 557 pg/mL (ref 211–911)

## 2022-05-13 MED ORDER — OXYCODONE HCL ER 10 MG PO T12A
10.0000 mg | EXTENDED_RELEASE_TABLET | Freq: Every day | ORAL | 0 refills | Status: DC
  Filled 2022-05-30 – 2022-06-09 (×4): qty 30, 30d supply, fill #0

## 2022-05-13 MED FILL — Atorvastatin Calcium Tab 20 MG (Base Equivalent): ORAL | 90 days supply | Qty: 90 | Fill #1 | Status: AC

## 2022-05-13 MED FILL — Clopidogrel Bisulfate Tab 75 MG (Base Equiv): ORAL | 90 days supply | Qty: 90 | Fill #1 | Status: AC

## 2022-05-13 NOTE — Patient Instructions (Signed)
I Am reducing the oxycontin to  one tablet daily   with your next refill  Continue the hydrocodone as you are taking   If your labs today confirm iron deficiency anemia,  I will refer you for colonoscopy EARLY

## 2022-05-13 NOTE — Progress Notes (Unsigned)
Subjective:  Patient ID: Tyrone Mcdowell, male    DOB: June 04, 1956  Age: 66 y.o. MRN: [id number]  CC: There were no encounter diagnoses.   HPI MADDEX GARLITZ presents for  Chief Complaint  Patient presents with   Follow-up    Medication refills    Back pain :  received initial visit with  PT via [facility]    Taking hydrcodone every 4 hours ( 6 times daily) and using oxycodone 10 mg  q 12 hours since last visit.     Outpatient Medications Prior to Visit  Medication Sig Dispense Refill   albuterol (VENTOLIN HFA) 108 (90 Base) MCG/ACT inhaler Inhale 2 puffs into the lungs every 6 (six) hours as needed for wheezing or shortness of breath. 8.5 g 11   ALPRAZolam (XANAX) 0.25 MG tablet Take 1 tablet (0.25 mg total) by mouth at bedtime as needed for anxiety. Not more than 3 times per week 15 tablet 5   amLODipine (NORVASC) 10 MG tablet TAKE 1 TABLET BY MOUTH DAILY 90 tablet 1   atorvastatin (LIPITOR) 20 MG tablet TAKE 1 TABLET BY MOUTH DAILY AT 6 PM. 90 tablet 1   cloNIDine (CATAPRES) 0.1 MG tablet Take 1 tablet (0.1 mg total) by mouth 3 (three) times daily. AS NEEDED FOR bp > 150/90 90 tablet 3   clopidogrel (PLAVIX) 75 MG tablet TAKE 1 TABLET BY MOUTH DAILY. 90 tablet 1   DULoxetine (CYMBALTA) 30 MG capsule TAKE 1 CAPSULE BY MOUTH TWICE DAILY 180 capsule 1   furosemide (LASIX) 20 MG tablet Take 1 tablet (20 mg total) by mouth daily as needed. 90 tablet 2   gabapentin (NEURONTIN) 300 MG capsule Take 2 capsules (600 mg total) by mouth 3 (three) times daily. 90 capsule 1   HYDROcodone-acetaminophen (NORCO) 10-325 MG tablet Take 1 tablet by mouth every 4 (four) hours as needed for moderate pain. 180 tablet 0   montelukast (SINGULAIR) 10 MG tablet Take 1 tablet (10 mg total) by mouth at bedtime. 30 tablet 3   oxyCODONE (OXYCONTIN) 10 mg 12 hr tablet Take 1 tablet (10 mg total) by mouth every 12 (twelve) hours. 60 tablet 0   pantoprazole (PROTONIX) 40 MG tablet TAKE 1 TABLET BY MOUTH  DAILY. 90 tablet 3   potassium chloride SA (KLOR-CON M) 20 MEQ tablet One tablet daily ,  one extra daily as needed 100 tablet 3   tiZANidine (ZANAFLEX) 4 MG tablet Take 1 tablet (4 mg total) by mouth 3 (three) times daily. 90 tablet 1   valsartan-hydrochlorothiazide (DIOVAN-HCT) 80-12.5 MG tablet TAKE 2 TABLETS BY MOUTH DAILY. 180 tablet 1   doxycycline (VIBRA-TABS) 100 MG tablet Take 1 tablet (100 mg total) by mouth 2 (two) times daily. (Patient not taking: Reported on 05/13/2022) 20 tablet 0   predniSONE (DELTASONE) 10 MG tablet 6 tablets daily for 3 days, then reduce by 1 tablet daily until gone (Patient not taking: Reported on 05/13/2022) 33 tablet 0   No facility-administered medications prior to visit.    Review of Systems;  Patient denies headache, fevers, malaise, unintentional weight loss, skin rash, eye pain, sinus congestion and sinus pain, sore throat, dysphagia,  hemoptysis , cough, dyspnea, wheezing, chest pain, palpitations, orthopnea, edema, abdominal pain, nausea, melena, diarrhea, constipation, flank pain, dysuria, hematuria, urinary  Frequency, nocturia, numbness, tingling, seizures,  Focal weakness, Loss of consciousness,  Tremor, insomnia, depression, anxiety, and suicidal ideation.      Objective:  BP 124/80 (BP Location: Left Arm, Patient  Position: Sitting, Cuff Size: Large)   Pulse 61   Temp 98.2 F (36.8 C) (Oral)   Ht '5\' 10"'$  (1.778 m)   Wt 222 lb 6.4 oz (100.9 kg)   SpO2 96%   BMI 31.91 kg/m   BP Readings from Last 3 Encounters:  05/13/22 124/80  02/16/22 110/68  12/04/21 126/82    Wt Readings from Last 3 Encounters:  05/13/22 222 lb 6.4 oz (100.9 kg)  02/16/22 229 lb 12.8 oz (104.2 kg)  12/04/21 217 lb (98.4 kg)    General appearance: alert, cooperative and appears stated age Ears: normal TM's and external ear canals both ears Throat: lips, mucosa, and tongue normal; teeth and gums normal Neck: no adenopathy, no carotid bruit, supple, symmetrical,  trachea midline and thyroid not enlarged, symmetric, no tenderness/mass/nodules Back: symmetric, no curvature. ROM normal. No CVA tenderness. Lungs: clear to auscultation bilaterally Heart: regular rate and rhythm, S1, S2 normal, no murmur, click, rub or gallop Abdomen: soft, non-tender; bowel sounds normal; no masses,  no organomegaly Pulses: 2+ and symmetric Skin: Skin color, texture, turgor normal. No rashes or lesions Lymph nodes: Cervical, supraclavicular, and axillary nodes normal.  Lab Results  Component Value Date   HGBA1C 5.6 01/04/2022   HGBA1C 5.6 06/12/2020   HGBA1C 5.5 05/09/2018    Lab Results  Component Value Date   CREATININE 0.87 02/16/2022   CREATININE 0.94 01/04/2022   CREATININE 0.94 07/16/2021    Lab Results  Component Value Date   WBC 8.2 02/16/2022   HGB 12.8 (L) 02/16/2022   HCT 37.7 (L) 02/16/2022   PLT 304.0 02/16/2022   GLUCOSE 68 (L) 02/16/2022   CHOL 138 01/04/2022   TRIG 75.0 01/04/2022   HDL 45.10 01/04/2022   LDLDIRECT 142.8 10/23/2012   LDLCALC 78 01/04/2022   ALT 11 01/04/2022   AST 13 01/04/2022   NA 138 02/16/2022   K 3.6 02/16/2022   CL 98 02/16/2022   CREATININE 0.87 02/16/2022   BUN 12 02/16/2022   CO2 34 (H) 02/16/2022   TSH 1.21 11/07/2017   PSA 0.17 01/04/2022   INR 1.07 05/08/2018   HGBA1C 5.6 01/04/2022   MICROALBUR <0.7 01/04/2022    MR Lumbar Spine W Wo Contrast  Result Date: 02/06/2021 CLINICAL DATA:  Low back pain with numbness and tingling in the left leg. EXAM: MRI LUMBAR SPINE WITHOUT AND WITH CONTRAST TECHNIQUE: Multiplanar and multiecho pulse sequences of the lumbar spine were obtained without and with intravenous contrast. CONTRAST:  58m GADAVIST GADOBUTROL 1 MMOL/ML IV SOLN COMPARISON:  01/18/2021.  02/20/2020. FINDINGS: Segmentation: 5 lumbar type vertebral bodies as numbered previously. Alignment:  Chronic fixed anterolisthesis at L5-S1 measuring 5-6 mm. Vertebrae:  No fracture or primary bone lesion. Conus  medullaris and cauda equina: Conus extends to the L1-2 level. Conus and cauda equina appear normal. Paraspinal and other soft tissues: Negative Disc levels: No abnormality at T10-11, T11-12 or T12-L1. L1-2: Minimal disc bulge.  No stenosis. L2-3: Minimal disc bulge. No stenosis. There is abutment of the spinous processes with mild edema and enhancement which could relate to back pain. L3-4: Minimal disc bulge.  No stenosis. L4-5: Mild bulging of the disc. Mild facet and ligamentous prominence. No compressive narrowing of the canal or foramina. L5-S1: Chronic fixed anterolisthesis of 5-6 mm. Posterior decompression, diskectomy and fusion. Pedicle screws and posterior rods in place. Apparent sufficient patency of the canal and foramina. No gross sign of arachnoiditis. IMPRESSION: 1. Good appearance at the decompression, diskectomy and fusion level  of L5-S1. Chronic fixed anterolisthesis of 5-6 mm. 2. Mild non-compressive degenerative changes at L1-2, L2-3, L3-4 and L4-5. 3. Abutment with edema and enhancement between the spinous processes at L2-3 which could be associated with back pain. Electronically Signed   By: Nelson Chimes M.D.   On: 02/06/2021 15:19    Assessment & Plan:   Problem List Items Addressed This Visit   None   I spent a total of   minutes with this patient in a face to face visit on the date of this encounter reviewing the last office visit with me on        ,  most recent with patient's cardiologist in    ,  patient'ss diet and eating habits, home blood pressure readings ,  most recent imaging study ,   and post visit ordering of testing and therapeutics.    Follow-up: No follow-ups on file.   [name], MD

## 2022-05-14 ENCOUNTER — Other Ambulatory Visit (HOSPITAL_COMMUNITY): Payer: Self-pay

## 2022-05-14 ENCOUNTER — Other Ambulatory Visit: Payer: Self-pay

## 2022-05-14 MED FILL — Hydrocodone-Acetaminophen Tab 10-325 MG: ORAL | 30 days supply | Qty: 180 | Fill #0 | Status: CN

## 2022-05-15 NOTE — Assessment & Plan Note (Signed)
I have encouraged him to continue efforts at   Continued weight loss with goal of 10% of body weigh over the next 6 months using a low glycemic index diet and regular exercise a minimum of 5 days per week.

## 2022-05-15 NOTE — Assessment & Plan Note (Signed)
Plans for ESI delayed by insurance requiring PT>  ,  But he has only had one session thus far.  Will continue to wean opioids..  Reducing oxycontin ton once daily and continue hydrocodone every 6 hours and gabapentin to 600 mg tid.

## 2022-05-15 NOTE — Assessment & Plan Note (Addendum)
An MRI done in 2019 during ER workup for ataxia and dizziness noted  And  Old right basal ganglia infarct. Mild chronic small vessel schemic changes.[name]  He is complaint with statin therapy and clopidrogel.  CArotid and vertebral artery atherosclerosis was noted.  Coronary calcium socre was zero during July 2019 follow up with cardiology.  continueu statin and plavix

## 2022-05-15 NOTE — Assessment & Plan Note (Signed)
He is currently asymptomatic  Without scheduled bronchodilators or ICS.

## 2022-05-15 NOTE — Assessment & Plan Note (Signed)
His sister had colon CA at age 64.  He is overdue for colonoscopy ,  Last one 2016.  Referring now, given onset of iron deficiency

## 2022-05-17 ENCOUNTER — Other Ambulatory Visit (HOSPITAL_COMMUNITY): Payer: Self-pay

## 2022-05-17 ENCOUNTER — Other Ambulatory Visit: Payer: Self-pay

## 2022-05-17 MED FILL — Hydrocodone-Acetaminophen Tab 10-325 MG: ORAL | 30 days supply | Qty: 180 | Fill #0 | Status: AC

## 2022-05-18 ENCOUNTER — Other Ambulatory Visit: Payer: Self-pay

## 2022-05-18 ENCOUNTER — Other Ambulatory Visit: Payer: Self-pay | Admitting: Internal Medicine

## 2022-05-18 MED ORDER — GABAPENTIN 300 MG PO CAPS
600.0000 mg | ORAL_CAPSULE | Freq: Three times a day (TID) | ORAL | 1 refills | Status: DC
  Filled 2022-05-18: qty 90, 15d supply, fill #0
  Filled 2022-06-04: qty 90, 15d supply, fill #1

## 2022-05-19 ENCOUNTER — Other Ambulatory Visit: Payer: Self-pay

## 2022-05-19 DIAGNOSIS — Z1211 Encounter for screening for malignant neoplasm of colon: Secondary | ICD-10-CM

## 2022-05-19 MED ORDER — NA SULFATE-K SULFATE-MG SULF 17.5-3.13-1.6 GM/177ML PO SOLN
1.0000 | Freq: Once | ORAL | 0 refills | Status: AC
  Filled 2022-05-19: qty 354, 1d supply, fill #0

## 2022-05-19 NOTE — Progress Notes (Signed)
Gastroenterology Pre-Procedure Review  Request Date: 06/30/2022 Requesting Physician: Dr. Marius Ditch   PATIENT REVIEW QUESTIONS: The patient responded to the following health history questions as indicated:    1. Are you having any GI issues? no 2. Do you have a personal history of Polyps? no 3. Do you have a family history of Colon Cancer or Polyps? yes (both) 4. Diabetes Mellitus? no 5. Joint replacements in the past 12 months?no 6. Major health problems in the past 3 months?no 7. Any artificial heart valves, MVP, or defibrillator?no    MEDICATIONS & ALLERGIES:    Patient reports the following regarding taking any anticoagulation/antiplatelet therapy:   Plavix, Coumadin, Eliquis, Xarelto, Lovenox, Pradaxa, Brilinta, or Effient? yes (plavix) Aspirin? no  Patient confirms/reports the following medications:  Current Outpatient Medications  Medication Sig Dispense Refill   albuterol (VENTOLIN HFA) 108 (90 Base) MCG/ACT inhaler Inhale 2 puffs into the lungs every 6 (six) hours as needed for wheezing or shortness of breath. 8.5 g 11   ALPRAZolam (XANAX) 0.25 MG tablet Take 1 tablet (0.25 mg total) by mouth at bedtime as needed for anxiety. Not more than 3 times per week 15 tablet 5   amLODipine (NORVASC) 10 MG tablet TAKE 1 TABLET BY MOUTH DAILY 90 tablet 1   atorvastatin (LIPITOR) 20 MG tablet TAKE 1 TABLET BY MOUTH DAILY AT 6 PM. 90 tablet 1   cloNIDine (CATAPRES) 0.1 MG tablet Take 1 tablet (0.1 mg total) by mouth 3 (three) times daily. AS NEEDED FOR bp > 150/90 90 tablet 3   clopidogrel (PLAVIX) 75 MG tablet TAKE 1 TABLET BY MOUTH DAILY. 90 tablet 1   DULoxetine (CYMBALTA) 30 MG capsule TAKE 1 CAPSULE BY MOUTH TWICE DAILY 180 capsule 1   furosemide (LASIX) 20 MG tablet Take 1 tablet (20 mg total) by mouth daily as needed. 90 tablet 2   gabapentin (NEURONTIN) 300 MG capsule Take 2 capsules (600 mg total) by mouth 3 (three) times daily. 90 capsule 1   HYDROcodone-acetaminophen (NORCO)  10-325 MG tablet Take 1 tablet by mouth every 4 (four) hours as needed for moderate pain. 180 tablet 0   montelukast (SINGULAIR) 10 MG tablet Take 1 tablet (10 mg total) by mouth at bedtime. 30 tablet 3   [START ON 05/29/2022] oxyCODONE (OXYCONTIN) 10 mg 12 hr tablet Take 1 tablet (10 mg total) by mouth daily. 30 tablet 0   pantoprazole (PROTONIX) 40 MG tablet TAKE 1 TABLET BY MOUTH DAILY. 90 tablet 3   potassium chloride SA (KLOR-CON M) 20 MEQ tablet One tablet daily ,  one extra daily as needed 100 tablet 3   tiZANidine (ZANAFLEX) 4 MG tablet Take 1 tablet (4 mg total) by mouth 3 (three) times daily. 90 tablet 1   valsartan-hydrochlorothiazide (DIOVAN-HCT) 80-12.5 MG tablet TAKE 2 TABLETS BY MOUTH DAILY. 180 tablet 1   No current facility-administered medications for this visit.    Patient confirms/reports the following allergies:  No Known Allergies  No orders of the defined types were placed in this encounter.   AUTHORIZATION INFORMATION Primary Insurance: 1D#: Group #:  Secondary Insurance: 1D#: Group #:  SCHEDULE INFORMATION: Date: 06/30/2022 Time: Location: [facility]

## 2022-05-24 ENCOUNTER — Other Ambulatory Visit: Payer: Self-pay

## 2022-05-28 ENCOUNTER — Encounter: Payer: Self-pay | Admitting: Internal Medicine

## 2022-05-28 DIAGNOSIS — F1120 Opioid dependence, uncomplicated: Secondary | ICD-10-CM

## 2022-05-30 ENCOUNTER — Other Ambulatory Visit: Payer: Self-pay

## 2022-05-31 ENCOUNTER — Other Ambulatory Visit: Payer: Self-pay

## 2022-06-01 NOTE — Assessment & Plan Note (Addendum)
I am begun weaning his narcotics at his June visit.  oxycontin has been reduced to  Once daily . Next refill July 6 (written June 24)

## 2022-06-04 ENCOUNTER — Other Ambulatory Visit: Payer: Self-pay

## 2022-06-07 ENCOUNTER — Other Ambulatory Visit: Payer: Self-pay

## 2022-06-09 ENCOUNTER — Other Ambulatory Visit: Payer: Self-pay

## 2022-06-10 ENCOUNTER — Other Ambulatory Visit: Payer: Self-pay

## 2022-06-13 ENCOUNTER — Other Ambulatory Visit: Payer: Self-pay

## 2022-06-13 ENCOUNTER — Other Ambulatory Visit: Payer: Self-pay | Admitting: Internal Medicine

## 2022-06-14 ENCOUNTER — Other Ambulatory Visit: Payer: Self-pay

## 2022-06-14 MED FILL — Hydrocodone-Acetaminophen Tab 10-325 MG: ORAL | 30 days supply | Qty: 180 | Fill #0 | Status: CN

## 2022-06-16 ENCOUNTER — Other Ambulatory Visit: Payer: Self-pay

## 2022-06-16 MED FILL — Hydrocodone-Acetaminophen Tab 10-325 MG: ORAL | 30 days supply | Qty: 180 | Fill #0 | Status: AC

## 2022-06-19 ENCOUNTER — Other Ambulatory Visit: Payer: Self-pay | Admitting: Internal Medicine

## 2022-06-20 ENCOUNTER — Other Ambulatory Visit: Payer: Self-pay | Admitting: Internal Medicine

## 2022-06-20 ENCOUNTER — Other Ambulatory Visit: Payer: Self-pay

## 2022-06-20 MED ORDER — GABAPENTIN 300 MG PO CAPS
600.0000 mg | ORAL_CAPSULE | Freq: Three times a day (TID) | ORAL | 1 refills | Status: DC
Start: 2022-06-20 — End: 2022-07-16
  Filled 2022-06-20: qty 90, 15d supply, fill #0
  Filled 2022-07-05: qty 90, 15d supply, fill #1

## 2022-06-21 ENCOUNTER — Other Ambulatory Visit: Payer: Self-pay

## 2022-06-21 MED ORDER — MONTELUKAST SODIUM 10 MG PO TABS
10.0000 mg | ORAL_TABLET | Freq: Every day | ORAL | 3 refills | Status: DC
  Filled 2022-06-21: qty 90, 90d supply, fill #0
  Filled 2022-07-05 – 2022-09-19 (×2): qty 90, 90d supply, fill #1
  Filled 2022-12-16: qty 90, 90d supply, fill #2
  Filled 2023-03-16: qty 90, 90d supply, fill #3

## 2022-06-30 ENCOUNTER — Ambulatory Visit: Payer: No Typology Code available for payment source | Admitting: Certified Registered"

## 2022-06-30 ENCOUNTER — Ambulatory Visit
Admission: RE | Admit: 2022-06-30 | Discharge: 2022-06-30 | Disposition: A | Discharge status: Home / Self Care | Payer: No Typology Code available for payment source | Attending: Gastroenterology | Admitting: Gastroenterology

## 2022-06-30 ENCOUNTER — Telehealth: Payer: Self-pay

## 2022-06-30 ENCOUNTER — Encounter
Admission: RE | Disposition: A | Discharge status: Home / Self Care | Payer: Self-pay | Source: Home / Self Care | Attending: Gastroenterology

## 2022-06-30 DIAGNOSIS — K644 Residual hemorrhoidal skin tags: Secondary | ICD-10-CM | POA: Diagnosis not present

## 2022-06-30 DIAGNOSIS — D128 Benign neoplasm of rectum: Secondary | ICD-10-CM | POA: Diagnosis not present

## 2022-06-30 DIAGNOSIS — D120 Benign neoplasm of cecum: Secondary | ICD-10-CM | POA: Diagnosis not present

## 2022-06-30 DIAGNOSIS — K635 Polyp of colon: Secondary | ICD-10-CM

## 2022-06-30 DIAGNOSIS — I10 Essential (primary) hypertension: Secondary | ICD-10-CM | POA: Diagnosis not present

## 2022-06-30 DIAGNOSIS — J449 Chronic obstructive pulmonary disease, unspecified: Secondary | ICD-10-CM | POA: Diagnosis not present

## 2022-06-30 DIAGNOSIS — E785 Hyperlipidemia, unspecified: Secondary | ICD-10-CM | POA: Diagnosis not present

## 2022-06-30 DIAGNOSIS — D124 Benign neoplasm of descending colon: Secondary | ICD-10-CM | POA: Insufficient documentation

## 2022-06-30 DIAGNOSIS — D126 Benign neoplasm of colon, unspecified: Secondary | ICD-10-CM

## 2022-06-30 DIAGNOSIS — Z87891 Personal history of nicotine dependence: Secondary | ICD-10-CM | POA: Diagnosis not present

## 2022-06-30 DIAGNOSIS — Z1211 Encounter for screening for malignant neoplasm of colon: Secondary | ICD-10-CM

## 2022-06-30 HISTORY — PX: COLONOSCOPY WITH PROPOFOL: SHX5780

## 2022-06-30 SURGERY — COLONOSCOPY WITH PROPOFOL
Anesthesia: General

## 2022-06-30 MED ORDER — GLYCOPYRROLATE 0.2 MG/ML IJ SOLN
INTRAMUSCULAR | Status: DC | PRN
  Administered 2022-06-30: .2 mg via INTRAVENOUS

## 2022-06-30 MED ORDER — STERILE WATER FOR IRRIGATION IR SOLN
Status: DC | PRN
  Administered 2022-06-30: 60 mL

## 2022-06-30 MED ORDER — LIDOCAINE HCL (CARDIAC) PF 100 MG/5ML IV SOSY
PREFILLED_SYRINGE | INTRAVENOUS | Status: DC | PRN
  Administered 2022-06-30: 100 mg via INTRAVENOUS

## 2022-06-30 MED ORDER — DEXMEDETOMIDINE HCL IN NACL 200 MCG/50ML IV SOLN
INTRAVENOUS | Status: DC | PRN
  Administered 2022-06-30: 8 ug via INTRAVENOUS

## 2022-06-30 MED ORDER — SODIUM CHLORIDE 0.9 % IV SOLN: INTRAVENOUS | Status: DC

## 2022-06-30 MED ORDER — PROPOFOL 10 MG/ML IV BOLUS
INTRAVENOUS | Status: DC | PRN
  Administered 2022-06-30: 20 mg via INTRAVENOUS
  Administered 2022-06-30: 10 mg via INTRAVENOUS
  Administered 2022-06-30: 70 mg via INTRAVENOUS

## 2022-06-30 MED ORDER — PROPOFOL 1000 MG/100ML IV EMUL
INTRAVENOUS | Status: AC
  Filled 2022-06-30: qty 300

## 2022-06-30 MED ORDER — PROPOFOL 500 MG/50ML IV EMUL
INTRAVENOUS | Status: DC | PRN
  Administered 2022-06-30: 165 ug/kg/min via INTRAVENOUS

## 2022-06-30 NOTE — Op Note (Signed)
[facility] Gastroenterology Patient Name: Tyrone Mcdowell Procedure Date: 06/30/2022 7:15 AM MRN: [id number] Account #: [id number] Date of Birth: 06-May-1956 Admit Type: Outpatient Age: 66 Room: [facility] ENDO ROOM 4 Gender: Male Note Status: Finalized Instrument Name: Colonoscope [id number] Procedure:             Colonoscopy Indications:           Screening for colorectal malignant neoplasm, Last                         colonoscopy: September 2015 Providers:             Lin Landsman MD, MD Referring MD:          Deborra Medina, MD (Referring MD) Medicines:             See the Anesthesia note for documentation of the                         administered medications Complications:         No immediate complications. Estimated blood loss: None. Procedure:             Pre-Anesthesia Assessment:                        - Prior to the procedure, a History and Physical was                         performed, and patient medications and allergies were                         reviewed. The patient is competent. The risks and                         benefits of the procedure and the sedation options and                         risks were discussed with the patient. All questions                         were answered and informed consent was obtained.                         Patient identification and proposed procedure were                         verified by the physician, the nurse, the                         anesthesiologist, the anesthetist and the technician                         in the pre-procedure area in the procedure room in the                         endoscopy suite. Mental Status Examination: alert and                         oriented. Airway Examination: normal oropharyngeal  airway and neck mobility. Respiratory Examination:                         clear to auscultation. CV Examination: normal.                         Prophylactic  Antibiotics: The patient does not require                         prophylactic antibiotics. Prior Anticoagulants: The                         patient has taken Plavix (clopidogrel), last dose was                         1 day prior to procedure. ASA Grade Assessment: III -                         A patient with severe systemic disease. After                         reviewing the risks and benefits, the patient was                         deemed in satisfactory condition to undergo the                         procedure. The anesthesia plan was to use general                         anesthesia. Immediately prior to administration of                         medications, the patient was re-assessed for adequacy                         to receive sedatives. The heart rate, respiratory                         rate, oxygen saturations, blood pressure, adequacy of                         pulmonary ventilation, and response to care were                         monitored throughout the procedure. The physical                         status of the patient was re-assessed after the                         procedure.                        After obtaining informed consent, the colonoscope was                         passed under direct vision. Throughout the procedure,  the patient's blood pressure, pulse, and oxygen                         saturations were monitored continuously. The                         Colonoscope was introduced through the anus and                         advanced to the the cecum, identified by appendiceal                         orifice and ileocecal valve. The colonoscopy was                         performed without difficulty. The patient tolerated                         the procedure well. The quality of the bowel                         preparation was evaluated using the BBPS [facility] Bowel                         Preparation Scale) with scores of:  Right Colon = 2                         (minor amount of residual staining, small fragments of                         stool and/or opaque liquid, but mucosa seen well),                         Transverse Colon = 2 (minor amount of residual                         staining, small fragments of stool and/or opaque                         liquid, but mucosa seen well) and Left Colon = 2                         (minor amount of residual staining, small fragments of                         stool and/or opaque liquid, but mucosa seen well). The                         total BBPS score equals 6. Findings:      The perianal and digital rectal examinations were normal. Pertinent       negatives include normal sphincter tone and no palpable rectal lesions.      Two sessile polyps were found in the cecum. The polyps were 3 to 5 mm in       size. These polyps were removed with a cold snare. Resection and       retrieval were complete. Estimated blood loss: none.  Two pedunculated polyps were found in the descending colon. The polyps       were 10 to 15 mm in size. These polyps were removed with a hot snare.       Resection and retrieval were complete. To prevent bleeding after the       polypectomy, two hemostatic clips were successfully placed. There was no       bleeding during, or at the end, of the procedure.      A 4 mm polyp was found in the proximal rectum. The polyp was sessile.       The polyp was removed with a cold snare. Resection and retrieval were       complete. Estimated blood loss: none.      A 20 mm polyp was found in the proximal rectum. The polyp was sessile.       Polypectomy was not attempted due to the patient taking anticoagulation       medication.      Non-bleeding external hemorrhoids were found during retroflexion. The       hemorrhoids were medium-sized. Impression:            - Two 3 to 5 mm polyps in the cecum, removed with a                         cold snare.  Resected and retrieved.                        - Two 10 to 15 mm polyps in the descending colon,                         removed with a hot snare. Resected and retrieved.                         Clips were placed.                        - One 4 mm polyp in the proximal rectum, removed with                         a cold snare. Resected and retrieved.                        - One 20 mm polyp in the proximal rectum. Resection                         not attempted.                        - Non-bleeding external hemorrhoids. Recommendation:        - Discharge patient to home (with escort).                        - Resume previous diet today.                        - Continue present medications.                        - Resume Plavix (clopidogrel) in 5 days at prior dose.  Refer to managing physician for further adjustment of                         therapy.                        - Await pathology results.                        - Perform a flexible sigmoidoscopy for polypectomy at                         the next available appointment. Procedure Code(s):     --- Professional ---                        [phone], Colonoscopy, flexible; with removal of                         tumor(s), polyp(s), or other lesion(s) by snare                         technique Diagnosis Code(s):     --- Professional ---                        Z12.11, Encounter for screening for malignant neoplasm                         of colon                        K63.5, Polyp of colon                        K62.1, Rectal polyp                        K64.4, Residual hemorrhoidal skin tags CPT copyright 2019 American Medical Association. All rights reserved. The codes documented in this report are preliminary and upon coder review may  be revised to meet current compliance requirements. Dr. Ulyess Mort Lin Landsman MD, MD 06/30/2022 9:26:21 AM This report has been signed electronically. Number  of Addenda: 0 Note Initiated On: 06/30/2022 7:15 AM Scope Withdrawal Time: 0 hours 32 minutes 20 seconds  Total Procedure Duration: 0 hours 35 minutes 21 seconds  Estimated Blood Loss:  Estimated blood loss: none.      [facility]

## 2022-06-30 NOTE — Anesthesia Procedure Notes (Signed)
Procedure Name: General with mask airway Date/Time: 06/30/2022 8:59 AM  Performed by: Kelton Pillar, CRNAPre-anesthesia Checklist: Patient identified, Emergency Drugs available, Suction available and Patient being monitored Patient Re-evaluated:Patient Re-evaluated prior to induction Oxygen Delivery Method: Simple face mask Induction Type: IV induction Placement Confirmation: positive ETCO2, CO2 detector and breath sounds checked- equal and bilateral Dental Injury: Teeth and Oropharynx as per pre-operative assessment

## 2022-06-30 NOTE — Anesthesia Preprocedure Evaluation (Addendum)
Anesthesia Evaluation  Patient identified by MRN, date of birth, ID band Patient awake    Reviewed: Allergy & Precautions, NPO status , Patient's Chart, lab work & pertinent test results  Airway Mallampati: II  TM Distance: >3 FB Neck ROM: Full    Dental  (+) Upper Dentures, Lower Dentures   Pulmonary neg pulmonary ROS, COPD,  COPD inhaler, former smoker,    Pulmonary exam normal  + decreased breath sounds      Cardiovascular Exercise Tolerance: Good hypertension, Pt. on medications negative cardio ROS Normal cardiovascular exam Rhythm:Regular     Neuro/Psych Anxiety Depression negative neurological ROS  negative psych ROS   GI/Hepatic negative GI ROS, Neg liver ROS,   Endo/Other  negative endocrine ROS  Renal/GU negative Renal ROS  negative genitourinary   Musculoskeletal   Abdominal   Peds negative pediatric ROS (+)  Hematology negative hematology ROS (+)   Anesthesia Other Findings Past Medical History: No date: Anxiety     Comment:  takes Valium as needed No date: Arthritis No date: Chronic back pain     Comment:  spondylolisthesis No date: Depression     Comment:  takes Zoloft daily No date: Hyperlipidemia     Comment:  takes Atorvastatin daily No date: Hypertension     Comment:  takes Micardis and AMlodipine daily No date: Joint pain No date: Joint swelling No date: Peripheral edema     Comment:  takes Lasix daily No date: Urinary frequency     Comment:  takes Rapaflo daily No date: Weakness     Comment:  numbness and tingling  Past Surgical History: 04/2016: BACK SURGERY No date: COLONOSCOPY 1989: TIBIA FRACTURE SURGERY; Bilateral     Comment:  multiple surgeries  1989: TIBIA HARDWARE REMOVAL  BMI    Body Mass Index: 31.57 kg/m      Reproductive/Obstetrics negative OB ROS                            Anesthesia Physical Anesthesia Plan  ASA: 3  Anesthesia  Plan: General   Post-op Pain Management:    Induction: Intravenous  PONV Risk Score and Plan: Propofol infusion and TIVA  Airway Management Planned: Natural Airway  Additional Equipment:   Intra-op Plan:   Post-operative Plan:   Informed Consent: I have reviewed the patients History and Physical, chart, labs and discussed the procedure including the risks, benefits and alternatives for the proposed anesthesia with the patient or authorized representative who has indicated his/her understanding and acceptance.     Dental Advisory Given  Plan Discussed with: CRNA and Surgeon  Anesthesia Plan Comments:         Anesthesia Quick Evaluation

## 2022-06-30 NOTE — H&P (Signed)
Tyrone Darby, MD [address]  [place], [place] [address]  Main: [phone]  Fax: [phone] Pager: [phone]  Primary Care Physician:  Tyrone Mc, MD Primary Gastroenterologist:  Dr. Cephas Mcdowell  Pre-Procedure History & Physical: HPI:  Tyrone Mcdowell is a 66 y.o. male is here for an colonoscopy.   Past Medical History:  Diagnosis Date   Anxiety    takes Valium as needed   Arthritis    Chronic back pain    spondylolisthesis   Depression    takes Zoloft daily   Hyperlipidemia    takes Atorvastatin daily   Hypertension    takes Micardis and AMlodipine daily   Joint pain    Joint swelling    Peripheral edema    takes Lasix daily   Urinary frequency    takes Rapaflo daily   Weakness    numbness and tingling    Past Surgical History:  Procedure Laterality Date   BACK SURGERY  04/2016   COLONOSCOPY     TIBIA FRACTURE SURGERY Bilateral 1989   multiple surgeries    [place]    Prior to Admission medications   Medication Sig Start Date End Date Taking? Authorizing Provider  albuterol (VENTOLIN HFA) 108 (90 Base) MCG/ACT inhaler Inhale 2 puffs into the lungs every 6 (six) hours as needed for wheezing or shortness of breath. 02/16/22  Yes Tyrone Mc, MD  amLODipine (NORVASC) 10 MG tablet TAKE 1 TABLET BY MOUTH DAILY 01/20/22 01/20/23 Yes Tyrone Mc, MD  atorvastatin (LIPITOR) 20 MG tablet TAKE 1 TABLET BY MOUTH DAILY AT 6 PM. 02/17/22 02/17/23 Yes Tyrone Mc, MD  cloNIDine (CATAPRES) 0.1 MG tablet Take 1 tablet (0.1 mg total) by mouth 3 (three) times daily. AS NEEDED FOR bp > 150/90 07/16/21  Yes Tyrone Mc, MD  clopidogrel (PLAVIX) 75 MG tablet TAKE 1 TABLET BY MOUTH DAILY. 02/17/22 02/17/23 Yes Tyrone Mc, MD  DULoxetine (CYMBALTA) 30 MG capsule TAKE 1 CAPSULE BY MOUTH TWICE DAILY 01/11/22 01/11/23 Yes Tyrone Mc, MD  furosemide (LASIX) 20 MG tablet Take 1 tablet (20 mg total) by mouth daily as  needed. 06/30/18  Yes Gollan, Kathlene November, MD  gabapentin (NEURONTIN) 300 MG capsule Take 2 capsules (600 mg total) by mouth 3 (three) times daily. 06/20/22 06/20/23 Yes Dutch Quint B, FNP  HYDROcodone-acetaminophen (NORCO) 10-325 MG tablet Take 1 tablet by mouth every 4 (four) hours as needed for moderate pain. 06/14/22  Yes Tyrone Mc, MD  montelukast (SINGULAIR) 10 MG tablet Take 1 tablet (10 mg total) by mouth at bedtime. 06/21/22  Yes Tyrone Mc, MD  oxyCODONE (OXYCONTIN) 10 mg 12 hr tablet Take 1 tablet (10 mg total) by mouth daily. 05/29/22  Yes Tyrone Mc, MD  pantoprazole (PROTONIX) 40 MG tablet TAKE 1 TABLET BY MOUTH DAILY. 01/11/22 01/11/23 Yes Tyrone Mc, MD  potassium chloride SA (KLOR-CON M) 20 MEQ tablet One tablet daily ,  one extra daily as needed 01/05/22  Yes Tyrone Mc, MD  tiZANidine (ZANAFLEX) 4 MG tablet Take 1 tablet (4 mg total) by mouth 3 (three) times daily. 02/16/22  Yes Tyrone Mc, MD  valsartan-hydrochlorothiazide (DIOVAN-HCT) 80-12.5 MG tablet TAKE 2 TABLETS BY MOUTH DAILY. 04/22/22 04/22/23 Yes Tyrone Mc, MD    Allergies as of 05/19/2022   (No Known Allergies)    Family History  Problem Relation Age of Onset   Cancer  Mother        lung Ca, tobacco abuse   CAD Mother        MI at age 82   Stroke Father    Hypertension Father    Heart disease Father        pacemaker, heart failure   Kidney disease Father    Ulcerative colitis Father    Cancer Sister 41       colon CA   Cancer Other        colon CA at age 70   Ulcerative colitis Brother     Social History   Socioeconomic History   Marital status: Married    Spouse name: Not on file   Number of children: Not on file   Years of education: Not on file   Highest education level: Not on file  Occupational History   Not on file  Tobacco Use   Smoking status: Former    Packs/day: 0.00    Years: 34.00    Total pack years: 0.00    Types: Cigarettes    Start date: 63     Quit date: 2015    Years since quitting: 8.5   Smokeless tobacco: Current    Types: Chew   Tobacco comments:    34 year pack history  Substance and Sexual Activity   Alcohol use: No    Alcohol/week: 0.0 standard drinks of alcohol    Comment: no alcohol in over a yr   Drug use: No   Sexual activity: Not Currently  Other Topics Concern   Not on file  Social History Narrative   [name] is married with 2 grown children and 2 grandchildren. He is on disability for back and leg pain.    Social Determinants of Health   Financial Resource Strain: Not on file  Food Insecurity: Not on file  Transportation Needs: Not on file  Physical Activity: Not on file  Stress: Not on file  Social Connections: Not on file  Intimate Partner Violence: Not on file    Review of Systems: See HPI, otherwise negative ROS  Physical Exam: BP (!) 173/95   Pulse (!) 56   Temp (!) 96.5 F (35.8 C) (Temporal)   Resp 20   Ht '5\' 10"'$  (1.778 m)   Wt 99.8 kg   SpO2 100%   BMI 31.57 kg/m  General:   Alert,  pleasant and cooperative in NAD Head:  Normocephalic and atraumatic. Neck:  Supple; no masses or thyromegaly. Lungs:  Clear throughout to auscultation.    Heart:  Regular rate and rhythm. Abdomen:  Soft, nontender and nondistended. Normal bowel sounds, without guarding, and without rebound.   Neurologic:  Alert and  oriented x4;  grossly normal neurologically.  Impression/Plan: Tyrone Mcdowell is here for an colonoscopy to be performed for colon cancer screening  Risks, benefits, limitations, and alternatives regarding  colonoscopy have been reviewed with the patient.  Questions have been answered.  All parties agreeable.   Sherri Sear, MD  06/30/2022, 7:53 AM

## 2022-06-30 NOTE — Anesthesia Postprocedure Evaluation (Signed)
Anesthesia Post Note  Patient: Tyrone Mcdowell  Procedure(s) Performed: COLONOSCOPY WITH PROPOFOL  Patient location during evaluation: PACU Anesthesia Type: General Level of consciousness: awake and awake and alert Pain management: pain level controlled Vital Signs Assessment: post-procedure vital signs reviewed and stable Respiratory status: spontaneous breathing and respiratory function stable Cardiovascular status: stable Anesthetic complications: no   No notable events documented.   Last Vitals:  Vitals:   06/30/22 0933 06/30/22 0943  BP: (!) 147/80 (!) 157/98  Pulse: 63 60  Resp: 20 20  Temp:    SpO2: 100% 99%    Last Pain:  Vitals:   06/30/22 0943  TempSrc:   PainSc: 0-No pain                 VAN STAVEREN,Benji Poynter

## 2022-06-30 NOTE — Telephone Encounter (Signed)
Per Colonoscopy report patient needs to schedule a Flexsigmoid for Polypectomy. For the rectal polyp. Called patient and left a message for call back

## 2022-06-30 NOTE — Transfer of Care (Signed)
Immediate Anesthesia Transfer of Care Note  Patient: Tyrone Mcdowell  Procedure(s) Performed: COLONOSCOPY WITH PROPOFOL  Patient Location: Endoscopy Unit  Anesthesia Type:General  Level of Consciousness: drowsy and patient cooperative  Airway & Oxygen Therapy: Patient Spontanous Breathing and Patient connected to face mask oxygen  Post-op Assessment: Report given to RN and Post -op Vital signs reviewed and stable  Post vital signs: Reviewed and stable  Last Vitals:  Vitals Value Taken Time  BP 100/56 06/30/22 0923  Temp 35.9 C 06/30/22 0923  Pulse 51 06/30/22 0925  Resp 12 06/30/22 0925  SpO2 100 % 06/30/22 0925  Vitals shown include unvalidated device data.  Last Pain:  Vitals:   06/30/22 0923  TempSrc: Tympanic  PainSc: Asleep         Complications: No notable events documented.

## 2022-07-01 ENCOUNTER — Encounter: Payer: Self-pay | Admitting: Gastroenterology

## 2022-07-01 ENCOUNTER — Other Ambulatory Visit: Payer: No Typology Code available for payment source

## 2022-07-01 ENCOUNTER — Telehealth: Payer: Self-pay | Admitting: Gastroenterology

## 2022-07-01 DIAGNOSIS — K621 Rectal polyp: Secondary | ICD-10-CM

## 2022-07-01 LAB — SURGICAL PATHOLOGY

## 2022-07-01 NOTE — Telephone Encounter (Signed)
Called and left a message for call back sent mychart message

## 2022-07-01 NOTE — Telephone Encounter (Signed)
-----   Message from Lin Landsman, MD sent at 07/01/2022 12:12 PM EDT ----- The colon polyps that were removed came back as benign.  I see that you are trying to reach out to him to schedule flexible sigmoidoscopy ASAP for removal of rectal polyp.  Thanks RV

## 2022-07-01 NOTE — Telephone Encounter (Signed)
Patient returned call and left a message for call back. Returned patient call and schedule for 07/23/2022 in [place]. Went over instructions, sent to [employer] and mailed them. Faxed blood thinner clearance to PCP

## 2022-07-01 NOTE — Telephone Encounter (Signed)
Tyrone Landsman, MD  Shelby Mattocks, CMA The colon polyps that were removed came back as benign.  I see that you are trying to reach out to him to schedule flexible sigmoidoscopy ASAP for removal of rectal polyp.   Thanks  RV

## 2022-07-01 NOTE — Telephone Encounter (Signed)
Pts medical records for colonoscopy  for 06/30/2022 were sent to [facility] on 07/01/2022 by fax

## 2022-07-01 NOTE — Telephone Encounter (Signed)
Patient wife called back and states we have her number and we need to call [phone] to talk to him

## 2022-07-01 NOTE — Telephone Encounter (Signed)
Called and left a message for call back  

## 2022-07-05 ENCOUNTER — Other Ambulatory Visit: Payer: Self-pay | Admitting: Internal Medicine

## 2022-07-05 ENCOUNTER — Telehealth: Payer: Self-pay

## 2022-07-05 ENCOUNTER — Other Ambulatory Visit: Payer: Self-pay

## 2022-07-05 ENCOUNTER — Encounter: Payer: Self-pay | Admitting: Gastroenterology

## 2022-07-05 NOTE — Telephone Encounter (Signed)
Last OV: 05/13/2022

## 2022-07-05 NOTE — Telephone Encounter (Signed)
Per Dr. Derrel Nip patient needs to stop the Plavix 6 days before procedure and restart it 2 day after procedures. Patient verbalized understanding of instructions

## 2022-07-06 ENCOUNTER — Other Ambulatory Visit: Payer: Self-pay

## 2022-07-06 MED FILL — Oxycodone HCl Tab ER 12HR Deter 10 MG: ORAL | 30 days supply | Qty: 30 | Fill #0 | Status: CN

## 2022-07-06 MED FILL — Duloxetine HCl Enteric Coated Pellets Cap 30 MG (Base Eq): ORAL | 90 days supply | Qty: 180 | Fill #0 | Status: AC

## 2022-07-09 ENCOUNTER — Other Ambulatory Visit: Payer: Self-pay

## 2022-07-09 MED FILL — Oxycodone HCl Tab ER 12HR Deter 10 MG: ORAL | 30 days supply | Qty: 30 | Fill #0 | Status: AC

## 2022-07-13 ENCOUNTER — Ambulatory Visit (AMBULATORY_SURGERY_CENTER): Payer: No Typology Code available for payment source | Admitting: Anesthesiology

## 2022-07-13 ENCOUNTER — Other Ambulatory Visit: Payer: Self-pay | Admitting: Internal Medicine

## 2022-07-13 ENCOUNTER — Ambulatory Visit: Payer: No Typology Code available for payment source | Admitting: Anesthesiology

## 2022-07-13 ENCOUNTER — Other Ambulatory Visit: Payer: Self-pay

## 2022-07-13 ENCOUNTER — Ambulatory Visit
Admission: RE | Admit: 2022-07-13 | Discharge: 2022-07-13 | Disposition: A | Discharge status: Home / Self Care | Payer: No Typology Code available for payment source | Attending: Gastroenterology | Admitting: Gastroenterology

## 2022-07-13 ENCOUNTER — Encounter
Admission: RE | Disposition: A | Discharge status: Home / Self Care | Payer: Self-pay | Source: Home / Self Care | Attending: Gastroenterology

## 2022-07-13 ENCOUNTER — Encounter: Payer: Self-pay | Admitting: Gastroenterology

## 2022-07-13 DIAGNOSIS — F32A Depression, unspecified: Secondary | ICD-10-CM | POA: Insufficient documentation

## 2022-07-13 DIAGNOSIS — F419 Anxiety disorder, unspecified: Secondary | ICD-10-CM | POA: Diagnosis not present

## 2022-07-13 DIAGNOSIS — K621 Rectal polyp: Secondary | ICD-10-CM

## 2022-07-13 DIAGNOSIS — Z8379 Family history of other diseases of the digestive system: Secondary | ICD-10-CM | POA: Insufficient documentation

## 2022-07-13 DIAGNOSIS — C20 Malignant neoplasm of rectum: Secondary | ICD-10-CM | POA: Insufficient documentation

## 2022-07-13 DIAGNOSIS — G8929 Other chronic pain: Secondary | ICD-10-CM | POA: Diagnosis not present

## 2022-07-13 DIAGNOSIS — Z79899 Other long term (current) drug therapy: Secondary | ICD-10-CM | POA: Diagnosis not present

## 2022-07-13 DIAGNOSIS — Z8719 Personal history of other diseases of the digestive system: Secondary | ICD-10-CM | POA: Insufficient documentation

## 2022-07-13 DIAGNOSIS — Z8249 Family history of ischemic heart disease and other diseases of the circulatory system: Secondary | ICD-10-CM | POA: Insufficient documentation

## 2022-07-13 DIAGNOSIS — E785 Hyperlipidemia, unspecified: Secondary | ICD-10-CM | POA: Diagnosis not present

## 2022-07-13 DIAGNOSIS — I10 Essential (primary) hypertension: Secondary | ICD-10-CM

## 2022-07-13 DIAGNOSIS — J449 Chronic obstructive pulmonary disease, unspecified: Secondary | ICD-10-CM

## 2022-07-13 DIAGNOSIS — F17210 Nicotine dependence, cigarettes, uncomplicated: Secondary | ICD-10-CM | POA: Diagnosis not present

## 2022-07-13 DIAGNOSIS — M549 Dorsalgia, unspecified: Secondary | ICD-10-CM | POA: Insufficient documentation

## 2022-07-13 DIAGNOSIS — M1990 Unspecified osteoarthritis, unspecified site: Secondary | ICD-10-CM | POA: Diagnosis not present

## 2022-07-13 DIAGNOSIS — Z86010 Personal history of colonic polyps: Secondary | ICD-10-CM

## 2022-07-13 DIAGNOSIS — Z800 Family history of malignant neoplasm of digestive organs: Secondary | ICD-10-CM | POA: Insufficient documentation

## 2022-07-13 HISTORY — PX: FLEXIBLE SIGMOIDOSCOPY: SHX5431

## 2022-07-13 SURGERY — SIGMOIDOSCOPY, FLEXIBLE
Anesthesia: General

## 2022-07-13 MED ORDER — LIDOCAINE HCL (CARDIAC) PF 100 MG/5ML IV SOSY
PREFILLED_SYRINGE | INTRAVENOUS | Status: DC | PRN
  Administered 2022-07-13: 50 mg via INTRAVENOUS

## 2022-07-13 MED ORDER — LACTATED RINGERS IV SOLN: INTRAVENOUS | Status: DC

## 2022-07-13 MED ORDER — PROPOFOL 10 MG/ML IV BOLUS
INTRAVENOUS | Status: DC | PRN
  Administered 2022-07-13: 20 mg via INTRAVENOUS
  Administered 2022-07-13 (×2): 30 mg via INTRAVENOUS
  Administered 2022-07-13: 40 mg via INTRAVENOUS
  Administered 2022-07-13 (×4): 20 mg via INTRAVENOUS

## 2022-07-13 MED ORDER — ONDANSETRON HCL 4 MG/2ML IJ SOLN: 4.0000 mg | Freq: Once | INTRAMUSCULAR | Status: DC | PRN

## 2022-07-13 MED ORDER — FENTANYL CITRATE PF 50 MCG/ML IJ SOSY: 25.0000 ug | PREFILLED_SYRINGE | INTRAMUSCULAR | Status: DC | PRN

## 2022-07-13 MED ORDER — SODIUM CHLORIDE 0.9 % IV SOLN: INTRAVENOUS | Status: DC

## 2022-07-13 SURGICAL SUPPLY — 16 items
CLIP HMST 235XBRD CATH ROT (MISCELLANEOUS) IMPLANT
CLIP RESOLUTION 360 11X235 (MISCELLANEOUS) ×8
ELEVIEW  INJECTABLE COMP 10 (MISCELLANEOUS) ×1
GOWN CVR UNV OPN BCK APRN NK (MISCELLANEOUS) ×2 IMPLANT
GOWN ISOL THUMB LOOP REG UNIV (MISCELLANEOUS) ×4
INJECTABLE ELEVIEW COMP 10 (MISCELLANEOUS) IMPLANT
INJECTOR VARIJECT VIN23 (MISCELLANEOUS) IMPLANT
KIT PRC NS LF DISP ENDO (KITS) ×1 IMPLANT
KIT PROCEDURE OLYMPUS (KITS) ×2
MANIFOLD NEPTUNE II (INSTRUMENTS) ×2 IMPLANT
RETRIEVER NET ROTH 2.5X230 LF (MISCELLANEOUS) ×1 IMPLANT
SNARE COLD EXACTO (MISCELLANEOUS) ×1 IMPLANT
SNARE LASSO HEX 3 IN 1 (INSTRUMENTS) IMPLANT
TRAP ETRAP POLY (MISCELLANEOUS) ×1 IMPLANT
VARIJECT INJECTOR VIN23 (MISCELLANEOUS) ×2
WATER STERILE IRR 250ML POUR (IV SOLUTION) ×2 IMPLANT

## 2022-07-13 NOTE — H&P (Signed)
Tyrone Darby, MD [address]  [place]  [place], [place] [address]  Main: [phone]  Fax: [phone] Pager: [phone]  Primary Care Physician:  [name], MD Primary Gastroenterologist:  Dr. Cephas Mcdowell  Pre-Procedure History & Physical: HPI:  JABRIEL Mcdowell is a 66 y.o. male is here for an flexible sigmoidoscopy.   Past Medical History:  Diagnosis Date   Anxiety    takes Valium as needed   Arthritis    Chronic back pain    spondylolisthesis   Depression    takes Zoloft daily   Hyperlipidemia    takes Atorvastatin daily   Hypertension    takes Micardis and AMlodipine daily   Joint pain    Joint swelling    Peripheral edema    takes Lasix daily   Urinary frequency    takes Rapaflo daily   Weakness    numbness and tingling    Past Surgical History:  Procedure Laterality Date   BACK SURGERY  04/2016   COLONOSCOPY     COLONOSCOPY WITH PROPOFOL N/A 06/30/2022   Procedure: COLONOSCOPY WITH PROPOFOL;  Surgeon: Lin Landsman, MD;  Location: [facility] ENDOSCOPY;  Service: Gastroenterology;  Laterality: N/A;   TIBIA FRACTURE SURGERY Bilateral 1989   multiple surgeries    [place]    Prior to Admission medications   Medication Sig Start Date End Date Taking? Authorizing Provider  albuterol (VENTOLIN HFA) 108 (90 Base) MCG/ACT inhaler Inhale 2 puffs into the lungs every 6 (six) hours as needed for wheezing or shortness of breath. 02/16/22  Yes [name], MD  amLODipine (NORVASC) 10 MG tablet TAKE 1 TABLET BY MOUTH DAILY 01/20/22 01/20/23 Yes [name], MD  atorvastatin (LIPITOR) 20 MG tablet TAKE 1 TABLET BY MOUTH DAILY AT 6 PM. 02/17/22 02/17/23 Yes [name], MD  DULoxetine (CYMBALTA) 30 MG capsule TAKE 1 CAPSULE BY MOUTH TWICE DAILY 07/06/22 07/06/23 Yes [name], MD  gabapentin (NEURONTIN) 300 MG capsule Take 2 capsules (600 mg total) by mouth 3 (three) times daily. 06/20/22 06/20/23 Yes Dutch Quint B, FNP   HYDROcodone-acetaminophen (NORCO) 10-325 MG tablet Take 1 tablet by mouth every 4 (four) hours as needed for moderate pain. 06/14/22  Yes [name], MD  montelukast (SINGULAIR) 10 MG tablet Take 1 tablet (10 mg total) by mouth at bedtime. 06/21/22  Yes [name], MD  oxyCODONE (OXYCONTIN) 10 mg 12 hr tablet Take 1 tablet (10 mg total) by mouth daily. 07/06/22  Yes [name], MD  valsartan-hydrochlorothiazide (DIOVAN-HCT) 80-12.5 MG tablet TAKE 2 TABLETS BY MOUTH DAILY. 04/22/22 04/22/23 Yes [name], MD  cloNIDine (CATAPRES) 0.1 MG tablet Take 1 tablet (0.1 mg total) by mouth 3 (three) times daily. AS NEEDED FOR bp > 150/90 07/16/21   [name], MD  clopidogrel (PLAVIX) 75 MG tablet TAKE 1 TABLET BY MOUTH DAILY. 02/17/22 02/17/23  [name], MD  furosemide (LASIX) 20 MG tablet Take 1 tablet (20 mg total) by mouth daily as needed. 06/30/18   Minna Merritts, MD  pantoprazole (PROTONIX) 40 MG tablet TAKE 1 TABLET BY MOUTH DAILY. 01/11/22 01/11/23  [name], MD  potassium chloride SA (KLOR-CON M) 20 MEQ tablet One tablet daily ,  one extra daily as needed 01/05/22   [name], MD  tiZANidine (ZANAFLEX) 4 MG tablet Take 1 tablet (4 mg total) by mouth 3 (three) times daily. 02/16/22  [name], MD    Allergies as of 07/01/2022   (No Known Allergies)    Family History  Problem Relation Age of Onset   Cancer Mother        lung Ca, tobacco abuse   CAD Mother        MI at age 75   Stroke Father    Hypertension Father    Heart disease Father        pacemaker, heart failure   Kidney disease Father    Ulcerative colitis Father    Cancer Sister 68       colon CA   Cancer Other        colon CA at age 18   Ulcerative colitis Brother     Social History   Socioeconomic History   Marital status: Married    Spouse name: Not on file   Number of children: Not on file   Years of education: Not on file   Highest education level: Not on file   Occupational History   Not on file  Tobacco Use   Smoking status: Every Day    Packs/day: 1.25    Years: 34.00    Total pack years: 42.50    Types: Cigarettes    Start date: 1979   Smokeless tobacco: Current    Types: Chew   Tobacco comments:    34 year pack history  Substance and Sexual Activity   Alcohol use: No    Alcohol/week: 0.0 standard drinks of alcohol    Comment: no alcohol in over a yr   Drug use: No   Sexual activity: Not Currently  Other Topics Concern   Not on file  Social History Narrative   Mr. Sibley is married with 2 grown children and 2 grandchildren. He is on disability for back and leg pain.    Social Determinants of Health   Financial Resource Strain: Not on file  Food Insecurity: Not on file  Transportation Needs: Not on file  Physical Activity: Not on file  Stress: Not on file  Social Connections: Not on file  Intimate Partner Violence: Not on file    Review of Systems: See HPI, otherwise negative ROS  Physical Exam: BP (!) 160/80   Pulse (!) 57   Temp 98.2 F (36.8 C) (Temporal)   Ht '5\' 10"'$  (1.778 m)   Wt 99.3 kg   SpO2 98%   BMI 31.42 kg/m  General:   Alert,  pleasant and cooperative in NAD Head:  Normocephalic and atraumatic. Neck:  Supple; no masses or thyromegaly. Lungs:  Clear throughout to auscultation.    Heart:  Regular rate and rhythm. Abdomen:  Soft, nontender and nondistended. Normal bowel sounds, without guarding, and without rebound.   Neurologic:  Alert and  oriented x4;  grossly normal neurologically.  Impression/Plan: [name] is here for an flexible sigmoidoscopy to be performed for rectal polyp  Risks, benefits, limitations, and alternatives regarding  flexible sigmoidoscopy have been reviewed with the patient.  Questions have been answered.  All parties agreeable.   Sherri Sear, MD  07/13/2022, 7:41 AM

## 2022-07-13 NOTE — Anesthesia Postprocedure Evaluation (Signed)
Anesthesia Post Note  Patient: Tyrone Mcdowell  Procedure(s) Performed: [place]     Patient location during evaluation: PACU Anesthesia Type: General Level of consciousness: awake and alert Pain management: pain level controlled Vital Signs Assessment: post-procedure vital signs reviewed and stable Respiratory status: spontaneous breathing, nonlabored ventilation, respiratory function stable and patient connected to nasal cannula oxygen Cardiovascular status: blood pressure returned to baseline and stable Postop Assessment: no apparent nausea or vomiting Anesthetic complications: no   No notable events documented.  Molli Barrows

## 2022-07-13 NOTE — Anesthesia Procedure Notes (Signed)
Procedure Name: MAC Date/Time: 07/13/2022 8:28 AM  Performed by: Tollie Eth, CRNAPre-anesthesia Checklist: Patient identified, Emergency Drugs available, Suction available and Patient being monitored Patient Re-evaluated:Patient Re-evaluated prior to induction Oxygen Delivery Method: Nasal cannula Induction Type: IV induction Placement Confirmation: positive ETCO2

## 2022-07-13 NOTE — Transfer of Care (Signed)
Immediate Anesthesia Transfer of Care Note  Patient: Tyrone Mcdowell  Procedure(s) Performed: FLEXIBLE SIGMOIDOSCOPY  Patient Location: PACU  Anesthesia Type: General  Level of Consciousness: awake, alert  and patient cooperative  Airway and Oxygen Therapy: Patient Spontanous Breathing and Patient connected to supplemental oxygen  Post-op Assessment: Post-op Vital signs reviewed, Patient's Cardiovascular Status Stable, Respiratory Function Stable, Patent Airway and No signs of Nausea or vomiting  Post-op Vital Signs: Reviewed and stable  Complications: No notable events documented.

## 2022-07-13 NOTE — Op Note (Signed)
[facility] Gastroenterology Patient Name: Tyrone Mcdowell Procedure Date: 07/13/2022 8:17 AM MRN: [id number] Account #: [id number] Date of Birth: 1956-06-23 Admit Type: Outpatient Age: 66 Room: [facility] OR ROOM 01 Gender: Male Note Status: Finalized Instrument Name: [id number] Procedure:             Flexible Sigmoidoscopy Indications:           Personal history of colonic polyps Providers:             Lin Landsman MD, MD Referring MD:          Deborra Medina, MD (Referring MD) Medicines:             General Anesthesia Complications:         No immediate complications. Estimated blood loss: None. Procedure:             Pre-Anesthesia Assessment:                        - Prior to the procedure, a History and Physical was                         performed, and patient medications and allergies were                         reviewed. The patient is competent. The risks and                         benefits of the procedure and the sedation options and                         risks were discussed with the patient. All questions                         were answered and informed consent was obtained.                         Patient identification and proposed procedure were                         verified by the physician, the nurse, the                         anesthesiologist, the anesthetist and the technician                         in the pre-procedure area in the procedure room in the                         endoscopy suite. Mental Status Examination: alert and                         oriented. Airway Examination: normal oropharyngeal                         airway and neck mobility. Respiratory Examination:                         clear to auscultation. CV Examination: normal.  Prophylactic Antibiotics: The patient does not require                         prophylactic antibiotics. Prior Anticoagulants: The                         patient has  taken Plavix (clopidogrel), last dose was                         5 days prior to procedure. ASA Grade Assessment: III -                         A patient with severe systemic disease. After                         reviewing the risks and benefits, the patient was                         deemed in satisfactory condition to undergo the                         procedure. The anesthesia plan was to use general                         anesthesia. Immediately prior to administration of                         medications, the patient was re-assessed for adequacy                         to receive sedatives. The heart rate, respiratory                         rate, oxygen saturations, blood pressure, adequacy of                         pulmonary ventilation, and response to care were                         monitored throughout the procedure. The physical                         status of the patient was re-assessed after the                         procedure.                        After obtaining informed consent, the scope was passed                         under direct vision. The Endoscope was introduced                         through the anus and advanced to the the sigmoid                         colon. The flexible sigmoidoscopy was accomplished  without difficulty. The patient tolerated the                         procedure well. The quality of the bowel preparation                         was good. Findings:      The perianal and digital rectal examinations were normal. Pertinent       negatives include normal sphincter tone and no palpable rectal lesions.      A 30 mm polyp was found in the proximal rectum. The polyp was sessile.       Preparations were made for mucosal resection. [place] was injected with       adequate lift of the lesion from the muscularis propria. Snare mucosal       resection with Jabier Mutton net retrieval was performed. A 30 mm area was        resected. Resection and retrieval were complete. There was no bleeding       during and at the end of the procedure. To prevent bleeding after       mucosal resection, four hemostatic clips were successfully placed. There       was no bleeding during, or at the end, of the procedure. Impression:            - One 30 mm polyp in the proximal rectum, removed with                         mucosal resection. Resected and retrieved. Clips were                         placed.                        - Mucosal resection was performed. Resection and                         retrieval were complete. Recommendation:        - Discharge patient to home (with escort).                        - Resume previous diet today.                        - Await pathology results.                        - Continue present medications.                        - Resume Plavix (clopidogrel) in 3 days at prior dose.                         Refer to managing physician for further adjustment of                         therapy.                        - Perform a colonoscopy in 3 years. Procedure Code(s):     --- Professional ---  [phone], Sigmoidoscopy, flexible; with endoscopic                         mucosal resection Diagnosis Code(s):     --- Professional ---                        K62.1, Rectal polyp                        Z86.010, Personal history of colonic polyps CPT copyright 2019 [employer]. All rights reserved. The codes documented in this report are preliminary and upon coder review may  be revised to meet current compliance requirements. Dr. Ulyess Mort Lin Landsman MD, MD 07/13/2022 8:50:53 AM This report has been signed electronically. Number of Addenda: 0 Note Initiated On: 07/13/2022 8:17 AM Total Procedure Duration: 0 hours 16 minutes 3 seconds  Estimated Blood Loss:  Estimated blood loss: none.      [facility]

## 2022-07-13 NOTE — Anesthesia Preprocedure Evaluation (Signed)
Anesthesia Evaluation  Patient identified by MRN, date of birth, ID band Patient awake    Reviewed: Allergy & Precautions, H&P , NPO status , Patient's Chart, lab work & pertinent test results, reviewed documented beta blocker date and time   Airway Mallampati: II   Neck ROM: full    Dental  (+) Poor Dentition   Pulmonary COPD, Current Smoker and Patient abstained from smoking.,    Pulmonary exam normal        Cardiovascular Exercise Tolerance: Good hypertension, On Medications negative cardio ROS Normal cardiovascular exam Rhythm:regular Rate:Normal     Neuro/Psych PSYCHIATRIC DISORDERS Anxiety Depression  Neuromuscular disease    GI/Hepatic negative GI ROS, Neg liver ROS,   Endo/Other  negative endocrine ROS  Renal/GU negative Renal ROS  negative genitourinary   Musculoskeletal   Abdominal   Peds  Hematology negative hematology ROS (+)   Anesthesia Other Findings Past Medical History: No date: Anxiety     Comment:  takes Valium as needed No date: Arthritis No date: Chronic back pain     Comment:  spondylolisthesis No date: Depression     Comment:  takes Zoloft daily No date: Hyperlipidemia     Comment:  takes Atorvastatin daily No date: Hypertension     Comment:  takes Micardis and AMlodipine daily No date: Joint pain No date: Joint swelling No date: Peripheral edema     Comment:  takes Lasix daily No date: Urinary frequency     Comment:  takes Rapaflo daily No date: Weakness     Comment:  numbness and tingling Past Surgical History: 04/2016: BACK SURGERY No date: COLONOSCOPY 06/30/2022: COLONOSCOPY WITH PROPOFOL; N/A     Comment:  Procedure: COLONOSCOPY WITH PROPOFOL;  Surgeon: Lin Landsman, MD;  Location: [facility] ENDOSCOPY;  Service:               Gastroenterology;  Laterality: N/A; 1989: TIBIA FRACTURE SURGERY; Bilateral     Comment:  multiple surgeries  1989: TIBIA HARDWARE  REMOVAL BMI    Body Mass Index: 31.42 kg/m     Reproductive/Obstetrics negative OB ROS                             Anesthesia Physical Anesthesia Plan  ASA: 3  Anesthesia Plan: General   Post-op Pain Management:    Induction:   PONV Risk Score and Plan:   Airway Management Planned:   Additional Equipment:   Intra-op Plan:   Post-operative Plan:   Informed Consent: I have reviewed the patients History and Physical, chart, labs and discussed the procedure including the risks, benefits and alternatives for the proposed anesthesia with the patient or authorized representative who has indicated his/her understanding and acceptance.     Dental Advisory Given  Plan Discussed with: CRNA  Anesthesia Plan Comments:         Anesthesia Quick Evaluation

## 2022-07-14 ENCOUNTER — Other Ambulatory Visit: Payer: Self-pay | Admitting: Internal Medicine

## 2022-07-14 ENCOUNTER — Encounter: Payer: Self-pay | Admitting: Gastroenterology

## 2022-07-14 ENCOUNTER — Other Ambulatory Visit: Payer: Self-pay

## 2022-07-14 MED ORDER — HYDROCODONE-ACETAMINOPHEN 10-325 MG PO TABS
1.0000 | ORAL_TABLET | Freq: Four times a day (QID) | ORAL | 0 refills | Status: DC | PRN
  Filled 2022-07-14 – 2022-07-16 (×3): qty 120, 30d supply, fill #0

## 2022-07-15 ENCOUNTER — Other Ambulatory Visit: Payer: Self-pay | Admitting: Family

## 2022-07-15 ENCOUNTER — Other Ambulatory Visit: Payer: Self-pay | Admitting: Internal Medicine

## 2022-07-15 ENCOUNTER — Other Ambulatory Visit: Payer: Self-pay

## 2022-07-15 MED ORDER — ALPRAZOLAM 0.25 MG PO TABS
0.2500 mg | ORAL_TABLET | Freq: Every evening | ORAL | 5 refills | Status: DC | PRN
Start: 2022-07-15 — End: 2022-12-17
  Filled 2022-07-15: qty 15, 15d supply, fill #0
  Filled 2022-09-22: qty 15, 15d supply, fill #1
  Filled 2022-10-03 – 2022-10-25 (×2): qty 15, 15d supply, fill #2
  Filled 2022-11-04: qty 15, 15d supply, fill #3
  Filled 2022-11-19: qty 15, 35d supply, fill #3

## 2022-07-15 MED ORDER — AMLODIPINE BESYLATE 10 MG PO TABS
ORAL_TABLET | Freq: Every day | ORAL | 1 refills | Status: DC
Start: 2022-07-15 — End: 2022-12-16
  Filled 2022-07-15: qty 90, 90d supply, fill #0
  Filled 2022-10-11: qty 90, 90d supply, fill #1

## 2022-07-15 MED FILL — Valsartan-Hydrochlorothiazide Tab 80-12.5 MG: ORAL | 90 days supply | Qty: 180 | Fill #1 | Status: AC

## 2022-07-15 NOTE — Telephone Encounter (Signed)
Please refuse per note below.

## 2022-07-16 ENCOUNTER — Other Ambulatory Visit: Payer: Self-pay

## 2022-07-16 ENCOUNTER — Other Ambulatory Visit: Payer: Self-pay | Admitting: Internal Medicine

## 2022-07-16 ENCOUNTER — Encounter: Payer: Self-pay | Admitting: Gastroenterology

## 2022-07-16 LAB — SURGICAL PATHOLOGY

## 2022-07-16 MED FILL — Gabapentin Cap 300 MG: ORAL | 15 days supply | Qty: 90 | Fill #0 | Status: AC

## 2022-07-19 ENCOUNTER — Other Ambulatory Visit: Payer: Self-pay

## 2022-07-20 ENCOUNTER — Telehealth: Payer: Self-pay | Admitting: Gastroenterology

## 2022-07-20 NOTE — Telephone Encounter (Signed)
Pts medical records were sent out on 08/15.2023 for procedure on [id number] colonoscopy to [facility] phone- [phone] Fax-[phone]

## 2022-07-29 ENCOUNTER — Other Ambulatory Visit: Payer: Self-pay | Admitting: Internal Medicine

## 2022-07-29 ENCOUNTER — Other Ambulatory Visit: Payer: Self-pay

## 2022-07-29 ENCOUNTER — Telehealth: Payer: Self-pay | Admitting: Internal Medicine

## 2022-07-29 MED ORDER — CLONIDINE HCL 0.1 MG PO TABS
0.1000 mg | ORAL_TABLET | Freq: Three times a day (TID) | ORAL | 3 refills | Status: DC
  Filled 2022-07-29: qty 90, 30d supply, fill #0
  Filled 2022-08-30: qty 90, 30d supply, fill #1
  Filled 2022-09-29: qty 90, 30d supply, fill #2
  Filled 2022-12-25: qty 90, 30d supply, fill #3

## 2022-07-29 NOTE — Telephone Encounter (Signed)
Pt need refill on gabapentin-monthly sent to [facility]

## 2022-07-30 ENCOUNTER — Other Ambulatory Visit: Payer: Self-pay | Admitting: Internal Medicine

## 2022-07-30 ENCOUNTER — Other Ambulatory Visit: Payer: Self-pay

## 2022-07-30 ENCOUNTER — Telehealth: Payer: Self-pay

## 2022-07-30 MED ORDER — GABAPENTIN 300 MG PO CAPS
600.0000 mg | ORAL_CAPSULE | Freq: Three times a day (TID) | ORAL | 2 refills | Status: DC
Start: 2022-07-30 — End: 2022-10-25
  Filled 2022-07-30: qty 180, 30d supply, fill #0
  Filled 2022-08-30: qty 180, 30d supply, fill #1
  Filled 2022-09-29: qty 180, 30d supply, fill #2

## 2022-07-30 NOTE — Telephone Encounter (Signed)
LMTCB. Need to let pt know that medication has refilled as a 30 day supply.

## 2022-07-30 NOTE — Telephone Encounter (Signed)
Need to let pt know that medication was refilled on 07/16/2022.

## 2022-07-30 NOTE — Telephone Encounter (Signed)
Patient states he is returning our call.  I read Tyrone Mcdowell, CMA's message to patient.  Patient states he would like to see if we will increase his prescription from a 15-day supply to a 30-day supply.  Patient states he is taking his gabapentin (NEURONTIN) 300 MG capsule - two capsules, three times per day.  *Patient states his preferred pharmacy is [place].

## 2022-08-02 ENCOUNTER — Other Ambulatory Visit: Payer: Self-pay | Admitting: Internal Medicine

## 2022-08-02 ENCOUNTER — Other Ambulatory Visit: Payer: Self-pay

## 2022-08-03 ENCOUNTER — Other Ambulatory Visit: Payer: Self-pay

## 2022-08-03 ENCOUNTER — Other Ambulatory Visit: Payer: Self-pay | Admitting: Internal Medicine

## 2022-08-03 MED FILL — Oxycodone HCl Tab ER 12HR Deter 10 MG: ORAL | 30 days supply | Qty: 30 | Fill #0 | Status: CN

## 2022-08-03 NOTE — Telephone Encounter (Signed)
Refilled: 07/06/2022 Last OV: 05/13/2022 Next OV: not scheduled

## 2022-08-09 ENCOUNTER — Other Ambulatory Visit: Payer: Self-pay

## 2022-08-09 MED FILL — Oxycodone HCl Tab ER 12HR Deter 10 MG: ORAL | 30 days supply | Qty: 30 | Fill #0 | Status: AC

## 2022-08-10 ENCOUNTER — Other Ambulatory Visit: Payer: Self-pay | Admitting: Internal Medicine

## 2022-08-10 ENCOUNTER — Other Ambulatory Visit: Payer: Self-pay

## 2022-08-11 ENCOUNTER — Other Ambulatory Visit: Payer: Self-pay | Admitting: Internal Medicine

## 2022-08-11 ENCOUNTER — Other Ambulatory Visit: Payer: Self-pay

## 2022-08-11 MED ORDER — HYDROCODONE-ACETAMINOPHEN 10-325 MG PO TABS
1.0000 | ORAL_TABLET | Freq: Four times a day (QID) | ORAL | 0 refills | Status: DC | PRN
  Filled 2022-08-11: qty 120, 30d supply, fill #0

## 2022-08-12 ENCOUNTER — Other Ambulatory Visit: Payer: Self-pay

## 2022-08-18 ENCOUNTER — Other Ambulatory Visit: Payer: Self-pay | Admitting: Internal Medicine

## 2022-08-18 ENCOUNTER — Other Ambulatory Visit: Payer: Self-pay

## 2022-08-19 ENCOUNTER — Other Ambulatory Visit: Payer: Self-pay

## 2022-08-19 MED FILL — Atorvastatin Calcium Tab 20 MG (Base Equivalent): ORAL | 90 days supply | Qty: 90 | Fill #0 | Status: AC

## 2022-08-19 MED FILL — Clopidogrel Bisulfate Tab 75 MG (Base Equiv): ORAL | 90 days supply | Qty: 90 | Fill #0 | Status: AC

## 2022-08-30 ENCOUNTER — Other Ambulatory Visit: Payer: Self-pay

## 2022-09-07 ENCOUNTER — Other Ambulatory Visit: Payer: Self-pay | Admitting: Internal Medicine

## 2022-09-07 ENCOUNTER — Other Ambulatory Visit: Payer: Self-pay

## 2022-09-07 MED ORDER — OXYCODONE HCL ER 10 MG PO T12A
10.0000 mg | EXTENDED_RELEASE_TABLET | Freq: Every day | ORAL | 0 refills | Status: DC
  Filled 2022-09-07 – 2022-09-09 (×2): qty 30, 30d supply, fill #0

## 2022-09-07 NOTE — Telephone Encounter (Signed)
Refilled: 08/03/2022 Last OV: 05/13/2022 Next OV: 09/16/2022

## 2022-09-09 ENCOUNTER — Other Ambulatory Visit: Payer: Self-pay

## 2022-09-12 ENCOUNTER — Other Ambulatory Visit: Payer: Self-pay | Admitting: Internal Medicine

## 2022-09-13 ENCOUNTER — Other Ambulatory Visit: Payer: Self-pay

## 2022-09-13 MED ORDER — HYDROCODONE-ACETAMINOPHEN 10-325 MG PO TABS
1.0000 | ORAL_TABLET | Freq: Three times a day (TID) | ORAL | 0 refills | Status: DC | PRN
  Filled 2022-09-13: qty 90, 30d supply, fill #0

## 2022-09-15 ENCOUNTER — Other Ambulatory Visit: Payer: Self-pay | Admitting: Internal Medicine

## 2022-09-15 ENCOUNTER — Other Ambulatory Visit: Payer: Self-pay

## 2022-09-15 MED ORDER — POTASSIUM CHLORIDE CRYS ER 20 MEQ PO TBCR
EXTENDED_RELEASE_TABLET | ORAL | 3 refills | Status: DC
  Filled 2022-09-15: qty 100, 75d supply, fill #0
  Filled 2022-11-15: qty 100, 75d supply, fill #1
  Filled 2023-02-21: qty 100, 75d supply, fill #2
  Filled 2023-05-02: qty 100, 75d supply, fill #3

## 2022-09-16 ENCOUNTER — Ambulatory Visit (INDEPENDENT_AMBULATORY_CARE_PROVIDER_SITE_OTHER): Payer: No Typology Code available for payment source | Admitting: Internal Medicine

## 2022-09-16 ENCOUNTER — Other Ambulatory Visit: Payer: Self-pay

## 2022-09-16 ENCOUNTER — Encounter: Payer: Self-pay | Admitting: Internal Medicine

## 2022-09-16 VITALS — BP 112/70 | HR 67 | Temp 98.5°F | Ht 70.0 in | Wt 222.2 lb

## 2022-09-16 DIAGNOSIS — Z7902 Long term (current) use of antithrombotics/antiplatelets: Secondary | ICD-10-CM | POA: Diagnosis not present

## 2022-09-16 DIAGNOSIS — I10 Essential (primary) hypertension: Secondary | ICD-10-CM

## 2022-09-16 DIAGNOSIS — F1120 Opioid dependence, uncomplicated: Secondary | ICD-10-CM

## 2022-09-16 DIAGNOSIS — Z23 Encounter for immunization: Secondary | ICD-10-CM | POA: Diagnosis not present

## 2022-09-16 DIAGNOSIS — M5417 Radiculopathy, lumbosacral region: Secondary | ICD-10-CM

## 2022-09-16 DIAGNOSIS — E785 Hyperlipidemia, unspecified: Secondary | ICD-10-CM

## 2022-09-16 MED ORDER — CELECOXIB 200 MG PO CAPS
200.0000 mg | ORAL_CAPSULE | Freq: Every day | ORAL | 2 refills | Status: DC
  Filled 2022-09-16: qty 30, 30d supply, fill #0
  Filled 2022-10-11: qty 30, 30d supply, fill #1
  Filled 2022-11-10: qty 30, 30d supply, fill #2

## 2022-09-16 NOTE — Patient Instructions (Addendum)
Wean the oxycontin to off over the next month  Adding celebrex once daily for joint pain.  If the rx is not covered by insurance,  let me know   Schedule a lab appt for Fasting labs (lipids,  liver enzymes etc) in 3-4 weeks  Follow up with me  in 3 months   Quit smoking!  The plavix is not magic and will not protect you from heart attacks and strokes if you continue to smoke

## 2022-09-16 NOTE — Progress Notes (Addendum)
Subjective:  Patient ID: Tyrone Mcdowell, male    DOB: January 22, 1956  Age: 66 y.o. MRN: [id number]  CC: The primary encounter diagnosis was Hyperlipidemia LDL goal <100. Diagnoses of Primary hypertension, Encounter for current long term use of antiplatelet drug, Need for immunization against influenza, and Chronic narcotic dependence ([place]) were also pertinent to this visit.   HPI Tyrone Mcdowell presents for  Chief Complaint  Patient presents with   Follow-up    3 month follow up for medication    1) Pain management :  has been on chronic narcotics since 2020.  Initially for chronic leg pain secondary to remote trauma,  later on because of low back pain .  He has a historyof prior back surgery,  but has no evidence of spinal or foraminal stenosis by March 2022 MRI.  and I began weaning in [place].  He is currently taking 3 vicodin 10/325 daily and once oxycontin 10 mg daily ,  both refilled last week . He has already discussed with Maudie Mercury the process of weaning from oxycontin . He has increased his evening gabapentin dose.   Increased stressors :  son and wife have had to move for financial reasons.   For the past months he has been cooing with the loss of privacy    Outpatient Medications Prior to Visit  Medication Sig Dispense Refill   albuterol (VENTOLIN HFA) 108 (90 Base) MCG/ACT inhaler Inhale 2 puffs into the lungs every 6 (six) hours as needed for wheezing or shortness of breath. 8.5 g 11   ALPRAZolam (XANAX) 0.25 MG tablet Take 1 tablet (0.25 mg total) by mouth at bedtime as needed for anxiety. Not more than 3 times per week 15 tablet 5   amLODipine (NORVASC) 10 MG tablet TAKE 1 TABLET BY MOUTH DAILY 90 tablet 1   atorvastatin (LIPITOR) 20 MG tablet TAKE 1 TABLET BY MOUTH DAILY AT 6 PM. 90 tablet 1   cloNIDine (CATAPRES) 0.1 MG tablet Take 1 tablet (0.1 mg total) by mouth 3 (three) times daily. AS NEEDED FOR bp > 150/90 90 tablet 3   clopidogrel (PLAVIX) 75 MG tablet TAKE 1 TABLET BY  MOUTH DAILY. 90 tablet 1   DULoxetine (CYMBALTA) 30 MG capsule TAKE 1 CAPSULE BY MOUTH TWICE DAILY 180 capsule 1   furosemide (LASIX) 20 MG tablet Take 1 tablet (20 mg total) by mouth daily as needed. 90 tablet 2   gabapentin (NEURONTIN) 300 MG capsule Take 2 capsules (600 mg total) by mouth 3 (three) times daily. 180 capsule 2   HYDROcodone-acetaminophen (NORCO) 10-325 MG tablet Take 1 tablet by mouth every 8 (eight) hours as needed. 90 tablet 0   montelukast (SINGULAIR) 10 MG tablet Take 1 tablet (10 mg total) by mouth at bedtime. 90 tablet 3   oxyCODONE (OXYCONTIN) 10 mg 12 hr tablet Take 1 tablet (10 mg total) by mouth daily. 30 tablet 0   pantoprazole (PROTONIX) 40 MG tablet TAKE 1 TABLET BY MOUTH DAILY. 90 tablet 3   potassium chloride SA (KLOR-CON M) 20 MEQ tablet One tablet daily ,  one extra daily as needed 100 tablet 3   tiZANidine (ZANAFLEX) 4 MG tablet Take 1 tablet (4 mg total) by mouth 3 (three) times daily. 90 tablet 1   valsartan-hydrochlorothiazide (DIOVAN-HCT) 80-12.5 MG tablet TAKE 2 TABLETS BY MOUTH DAILY. 180 tablet 1   No facility-administered medications prior to visit.    Review of Systems;  Patient denies headache, fevers, malaise, unintentional weight loss,  skin rash, eye pain, sinus congestion and sinus pain, sore throat, dysphagia,  hemoptysis , cough, dyspnea, wheezing, chest pain, palpitations, orthopnea, edema, abdominal pain, nausea, melena, diarrhea, constipation, flank pain, dysuria, hematuria, urinary  Frequency, nocturia, numbness, tingling, seizures,  Focal weakness, Loss of consciousness,  Tremor, insomnia, depression, anxiety, and suicidal ideation.      Objective:  BP 112/70 (BP Location: Left Arm, Patient Position: Sitting, Cuff Size: Normal)   Pulse 67   Temp 98.5 F (36.9 C) (Oral)   Ht '5\' 10"'$  (1.778 m)   Wt 222 lb 3.2 oz (100.8 kg)   SpO2 98%   BMI 31.88 kg/m   BP Readings from Last 3 Encounters:  09/16/22 112/70  07/13/22 (!) 169/92   06/30/22 (!) 157/98    Wt Readings from Last 3 Encounters:  09/16/22 222 lb 3.2 oz (100.8 kg)  07/13/22 219 lb (99.3 kg)  06/30/22 220 lb (99.8 kg)    General appearance: alert, cooperative and appears stated age Ears: normal TM's and external ear canals both ears Throat: lips, mucosa, and tongue normal; teeth and gums normal Neck: no adenopathy, no carotid bruit, supple, symmetrical, trachea midline and thyroid not enlarged, symmetric, no tenderness/mass/nodules Back: symmetric, no curvature. ROM normal. No CVA tenderness. Lungs: clear to auscultation bilaterally Heart: regular rate and rhythm, S1, S2 normal, no murmur, click, rub or gallop Abdomen: soft, non-tender; bowel sounds normal; no masses,  no organomegaly Pulses: 2+ and symmetric Skin: Skin color, texture, turgor normal. No rashes or lesions Lymph nodes: Cervical, supraclavicular, and axillary nodes normal. Neuro:  awake and interactive with normal mood and affect. Higher cortical functions are normal. Speech is clear without word-finding difficulty or dysarthria. Extraocular movements are intact. Visual fields of both eyes are grossly intact. Sensation to light touch is grossly intact bilaterally of upper and lower extremities. Motor examination shows 4+/5 symmetric hand grip and upper extremity and 5/5 lower extremity strength. There is no pronation or drift. Gait is non-ataxic   Lab Results  Component Value Date   HGBA1C 5.6 01/04/2022   HGBA1C 5.6 06/12/2020   HGBA1C 5.5 05/09/2018    Lab Results  Component Value Date   CREATININE 0.75 05/13/2022   CREATININE 0.87 02/16/2022   CREATININE 0.94 01/04/2022    Lab Results  Component Value Date   WBC 4.4 05/13/2022   HGB 13.5 05/13/2022   HCT 40.6 05/13/2022   PLT 279.0 05/13/2022   GLUCOSE 98 05/13/2022   CHOL 138 01/04/2022   TRIG 75.0 01/04/2022   HDL 45.10 01/04/2022   LDLDIRECT 142.8 10/23/2012   LDLCALC 78 01/04/2022   ALT 34 05/13/2022   AST 37  05/13/2022   NA 141 05/13/2022   K 3.8 05/13/2022   CL 102 05/13/2022   CREATININE 0.75 05/13/2022   BUN 10 05/13/2022   CO2 32 05/13/2022   TSH 1.21 11/07/2017   PSA 0.17 01/04/2022   INR 1.07 05/08/2018   HGBA1C 5.6 01/04/2022   MICROALBUR <0.7 01/04/2022    No results found.  Assessment & Plan:   Problem List Items Addressed This Visit     Hypertension   Relevant Orders   Comprehensive metabolic panel   Hyperlipidemia LDL goal <100 - Primary   Relevant Orders   Lipid Panel w/reflex Direct LDL   Chronic narcotic dependence ([place])    We will continue reducing his use of narcotics.  He has already begun to decrease his use of  oxycontin ER , to every other day,  After which  he will receive only hydrocodone/apap for use every 8 hours,. Adding celebrex today      Other Visit Diagnoses     Encounter for current long term use of antiplatelet drug       Relevant Orders   CBC with Differential/Platelet   Need for immunization against influenza       Relevant Orders   Flu Vaccine QUAD 38moIM (Fluarix, Fluzone & Alfiuria Quad PF) (Completed)       I spent a total of  31  minutes with this patient in a face to face visit on the date of this encounter reviewing the last office visit with me in  une ,  most recent visit with orthopedics and physiatry  most recent MRI ,   and post visit ordering of testing and therapeutics.    Follow-up: Return in about 3 months (around 12/17/2022).   TCrecencio Mc MD

## 2022-09-18 NOTE — Assessment & Plan Note (Signed)
Adding celebrex and reducing narcotics gradually given lack of evidence of spinal stenosis by last mri

## 2022-09-18 NOTE — Assessment & Plan Note (Signed)
We will continue reducing his use of narcotics.  He has already begun to decrease his use of  oxycontin ER , to every other day,  After which he will receive only hydrocodone/apap for use every 8 hours,. Adding celebrex today

## 2022-09-20 ENCOUNTER — Other Ambulatory Visit: Payer: Self-pay

## 2022-09-22 ENCOUNTER — Other Ambulatory Visit: Payer: Self-pay

## 2022-09-29 ENCOUNTER — Other Ambulatory Visit: Payer: Self-pay

## 2022-10-01 ENCOUNTER — Other Ambulatory Visit: Payer: Self-pay

## 2022-10-01 MED FILL — Duloxetine HCl Enteric Coated Pellets Cap 30 MG (Base Eq): ORAL | 90 days supply | Qty: 180 | Fill #1 | Status: AC

## 2022-10-04 ENCOUNTER — Other Ambulatory Visit: Payer: Self-pay

## 2022-10-06 ENCOUNTER — Other Ambulatory Visit: Payer: Self-pay | Admitting: Internal Medicine

## 2022-10-06 ENCOUNTER — Other Ambulatory Visit: Payer: Self-pay

## 2022-10-06 MED ORDER — HYDROCODONE-ACETAMINOPHEN 10-325 MG PO TABS
1.0000 | ORAL_TABLET | Freq: Three times a day (TID) | ORAL | 0 refills | Status: DC | PRN
  Filled 2022-10-06 – 2022-10-12 (×3): qty 90, 30d supply, fill #0

## 2022-10-06 NOTE — Telephone Encounter (Signed)
Refilled: 09/13/2022 Last OV: 09/16/2022 Next OV: 12/17/2022

## 2022-10-07 ENCOUNTER — Other Ambulatory Visit: Payer: Self-pay

## 2022-10-11 ENCOUNTER — Other Ambulatory Visit: Payer: No Typology Code available for payment source

## 2022-10-11 ENCOUNTER — Other Ambulatory Visit: Payer: Self-pay | Admitting: Internal Medicine

## 2022-10-11 ENCOUNTER — Other Ambulatory Visit: Payer: Self-pay

## 2022-10-12 ENCOUNTER — Other Ambulatory Visit: Payer: Self-pay

## 2022-10-12 MED FILL — Valsartan-Hydrochlorothiazide Tab 80-12.5 MG: ORAL | 90 days supply | Qty: 180 | Fill #0 | Status: AC

## 2022-10-13 ENCOUNTER — Other Ambulatory Visit (INDEPENDENT_AMBULATORY_CARE_PROVIDER_SITE_OTHER): Payer: No Typology Code available for payment source

## 2022-10-13 DIAGNOSIS — E785 Hyperlipidemia, unspecified: Secondary | ICD-10-CM

## 2022-10-13 DIAGNOSIS — I10 Essential (primary) hypertension: Secondary | ICD-10-CM

## 2022-10-13 DIAGNOSIS — Z7902 Long term (current) use of antithrombotics/antiplatelets: Secondary | ICD-10-CM

## 2022-10-13 LAB — LIPID PANEL W/REFLEX DIRECT LDL
Cholesterol: 159 mg/dL (ref <200)
HDL: 55 mg/dL (ref >=40)
LDL Cholesterol (Calc): 87 mg/dL (calc)
Non-HDL Cholesterol (Calc): 104 mg/dL (calc) (ref <130)
Total CHOL/HDL Ratio: 2.9 (calc) (ref <5.0)
Triglycerides: 76 mg/dL (ref <150)

## 2022-10-13 LAB — COMPREHENSIVE METABOLIC PANEL
ALT: 12 U/L (ref 0–53)
AST: 16 U/L (ref 0–37)
Albumin: 4.2 g/dL (ref 3.5–5.2)
Alkaline Phosphatase: 83 U/L (ref 39–117)
BUN: 13 mg/dL (ref 6–23)
CO2: 36 mEq/L — ABNORMAL HIGH (ref 19–32)
Calcium: 9.1 mg/dL (ref 8.4–10.5)
Chloride: 101 mEq/L (ref 96–112)
Creatinine, Ser: 0.75 mg/dL (ref 0.40–1.50)
GFR: 95.67 mL/min (ref >60.00)
Glucose, Bld: 75 mg/dL (ref 70–99)
Potassium: 3.9 mEq/L (ref 3.5–5.1)
Sodium: 142 mEq/L (ref 135–145)
Total Bilirubin: 0.6 mg/dL (ref 0.2–1.2)
Total Protein: 6.4 g/dL (ref 6.0–8.3)

## 2022-10-13 LAB — CBC WITH DIFFERENTIAL/PLATELET
Basophils Absolute: 0 10*3/uL (ref 0.0–0.1)
Basophils Relative: 0.8 % (ref 0.0–3.0)
Eosinophils Absolute: 0.1 10*3/uL (ref 0.0–0.7)
Eosinophils Relative: 2.6 % (ref 0.0–5.0)
HCT: 41.8 % (ref 39.0–52.0)
Hemoglobin: 13.7 g/dL (ref 13.0–17.0)
Lymphocytes Relative: 25.8 % (ref 12.0–46.0)
Lymphs Abs: 1.4 10*3/uL (ref 0.7–4.0)
MCHC: 32.8 g/dL (ref 30.0–36.0)
MCV: 97.2 fl (ref 78.0–100.0)
Monocytes Absolute: 0.6 10*3/uL (ref 0.1–1.0)
Monocytes Relative: 10.5 % (ref 3.0–12.0)
Neutro Abs: 3.2 10*3/uL (ref 1.4–7.7)
Neutrophils Relative %: 60.3 % (ref 43.0–77.0)
Platelets: 266 10*3/uL (ref 150.0–400.0)
RBC: 4.31 Mil/uL (ref 4.22–5.81)
RDW: 13.9 % (ref 11.5–15.5)
WBC: 5.3 10*3/uL (ref 4.0–10.5)

## 2022-10-15 ENCOUNTER — Other Ambulatory Visit: Payer: Self-pay

## 2022-10-15 ENCOUNTER — Encounter: Payer: Self-pay | Admitting: Internal Medicine

## 2022-10-23 MED ORDER — VARENICLINE TARTRATE (STARTER) 0.5 MG X 11 & 1 MG X 42 PO TBPK
ORAL_TABLET | ORAL | 0 refills | Status: DC
  Filled 2022-10-23: qty 53, 30d supply, fill #0

## 2022-10-24 ENCOUNTER — Other Ambulatory Visit: Payer: Self-pay

## 2022-10-25 ENCOUNTER — Other Ambulatory Visit: Payer: Self-pay | Admitting: Internal Medicine

## 2022-10-25 ENCOUNTER — Other Ambulatory Visit: Payer: Self-pay

## 2022-10-25 MED ORDER — GABAPENTIN 300 MG PO CAPS
600.0000 mg | ORAL_CAPSULE | Freq: Three times a day (TID) | ORAL | 2 refills | Status: DC
  Filled 2022-10-25: qty 180, 30d supply, fill #0
  Filled 2022-11-28: qty 180, 30d supply, fill #1
  Filled 2022-12-25: qty 180, 30d supply, fill #2

## 2022-11-04 ENCOUNTER — Other Ambulatory Visit: Payer: Self-pay | Admitting: Internal Medicine

## 2022-11-04 ENCOUNTER — Other Ambulatory Visit: Payer: Self-pay

## 2022-11-05 ENCOUNTER — Other Ambulatory Visit: Payer: Self-pay

## 2022-11-07 ENCOUNTER — Other Ambulatory Visit: Payer: Self-pay

## 2022-11-08 ENCOUNTER — Other Ambulatory Visit: Payer: Self-pay

## 2022-11-08 MED FILL — Hydrocodone-Acetaminophen Tab 10-325 MG: ORAL | 30 days supply | Qty: 90 | Fill #0 | Status: CN

## 2022-11-08 NOTE — Telephone Encounter (Signed)
Refilled: 10/06/2022 Last OV: 09/16/2022 Next OV: 12/17/2022

## 2022-11-10 ENCOUNTER — Other Ambulatory Visit: Payer: Self-pay

## 2022-11-10 MED FILL — Hydrocodone-Acetaminophen Tab 10-325 MG: ORAL | 30 days supply | Qty: 90 | Fill #0 | Status: CN

## 2022-11-11 ENCOUNTER — Other Ambulatory Visit: Payer: Self-pay

## 2022-11-11 MED FILL — Clopidogrel Bisulfate Tab 75 MG (Base Equiv): ORAL | 90 days supply | Qty: 90 | Fill #1 | Status: AC

## 2022-11-11 MED FILL — Hydrocodone-Acetaminophen Tab 10-325 MG: ORAL | 30 days supply | Qty: 90 | Fill #0 | Status: AC

## 2022-11-11 MED FILL — Atorvastatin Calcium Tab 20 MG (Base Equivalent): ORAL | 90 days supply | Qty: 90 | Fill #1 | Status: AC

## 2022-11-15 ENCOUNTER — Other Ambulatory Visit: Payer: Self-pay

## 2022-11-16 NOTE — Telephone Encounter (Signed)
MyChart messgae sent to patient. 

## 2022-11-17 NOTE — Telephone Encounter (Signed)
Order placed

## 2022-11-17 NOTE — Telephone Encounter (Signed)
MyChart messgae sent to patient. 

## 2022-11-19 ENCOUNTER — Other Ambulatory Visit: Payer: Self-pay

## 2022-12-02 ENCOUNTER — Other Ambulatory Visit: Payer: Self-pay | Admitting: Internal Medicine

## 2022-12-02 ENCOUNTER — Other Ambulatory Visit: Payer: Self-pay

## 2022-12-02 MED ORDER — CELECOXIB 200 MG PO CAPS
200.0000 mg | ORAL_CAPSULE | Freq: Every day | ORAL | 2 refills | Status: DC
  Filled 2022-12-02 – 2022-12-11 (×2): qty 30, 30d supply, fill #0
  Filled 2023-01-07: qty 30, 30d supply, fill #1
  Filled 2023-02-06: qty 30, 30d supply, fill #2

## 2022-12-03 ENCOUNTER — Other Ambulatory Visit: Payer: Self-pay

## 2022-12-05 ENCOUNTER — Other Ambulatory Visit: Payer: Self-pay | Admitting: Internal Medicine

## 2022-12-07 ENCOUNTER — Other Ambulatory Visit: Payer: Self-pay

## 2022-12-07 MED ORDER — HYDROCODONE-ACETAMINOPHEN 10-325 MG PO TABS
1.0000 | ORAL_TABLET | Freq: Three times a day (TID) | ORAL | 0 refills | Status: DC | PRN
  Filled 2022-12-07 – 2022-12-11 (×2): qty 90, 30d supply, fill #0

## 2022-12-07 NOTE — Telephone Encounter (Signed)
Refilled: 11/08/2022 Last OV: 09/16/2022 Next OV: 12/17/2022

## 2022-12-13 ENCOUNTER — Other Ambulatory Visit: Payer: Self-pay

## 2022-12-17 ENCOUNTER — Encounter: Payer: Self-pay | Admitting: Internal Medicine

## 2022-12-17 ENCOUNTER — Ambulatory Visit (INDEPENDENT_AMBULATORY_CARE_PROVIDER_SITE_OTHER): Payer: 59 | Admitting: Internal Medicine

## 2022-12-17 ENCOUNTER — Other Ambulatory Visit: Payer: Self-pay

## 2022-12-17 VITALS — BP 100/70 | HR 58 | Temp 97.6°F | Ht 70.0 in | Wt 221.2 lb

## 2022-12-17 DIAGNOSIS — R5383 Other fatigue: Secondary | ICD-10-CM

## 2022-12-17 DIAGNOSIS — D126 Benign neoplasm of colon, unspecified: Secondary | ICD-10-CM

## 2022-12-17 DIAGNOSIS — E785 Hyperlipidemia, unspecified: Secondary | ICD-10-CM | POA: Diagnosis not present

## 2022-12-17 DIAGNOSIS — E611 Iron deficiency: Secondary | ICD-10-CM

## 2022-12-17 DIAGNOSIS — K621 Rectal polyp: Secondary | ICD-10-CM | POA: Diagnosis not present

## 2022-12-17 DIAGNOSIS — R7301 Impaired fasting glucose: Secondary | ICD-10-CM

## 2022-12-17 DIAGNOSIS — N403 Nodular prostate with lower urinary tract symptoms: Secondary | ICD-10-CM

## 2022-12-17 DIAGNOSIS — Z720 Tobacco use: Secondary | ICD-10-CM | POA: Diagnosis not present

## 2022-12-17 DIAGNOSIS — F411 Generalized anxiety disorder: Secondary | ICD-10-CM

## 2022-12-17 DIAGNOSIS — Z125 Encounter for screening for malignant neoplasm of prostate: Secondary | ICD-10-CM | POA: Diagnosis not present

## 2022-12-17 DIAGNOSIS — I10 Essential (primary) hypertension: Secondary | ICD-10-CM

## 2022-12-17 DIAGNOSIS — R338 Other retention of urine: Secondary | ICD-10-CM

## 2022-12-17 DIAGNOSIS — F1120 Opioid dependence, uncomplicated: Secondary | ICD-10-CM

## 2022-12-17 MED ORDER — AMLODIPINE BESYLATE 10 MG PO TABS
ORAL_TABLET | Freq: Every day | ORAL | 1 refills | Status: DC
  Filled 2022-12-17: qty 90, 90d supply, fill #0

## 2022-12-17 MED ORDER — HYDROCODONE-ACETAMINOPHEN 10-325 MG PO TABS
1.0000 | ORAL_TABLET | Freq: Three times a day (TID) | ORAL | 0 refills | Status: DC | PRN
  Filled 2022-12-17 – 2023-01-11 (×3): qty 90, 30d supply, fill #0

## 2022-12-17 MED ORDER — ALPRAZOLAM 0.25 MG PO TABS
0.2500 mg | ORAL_TABLET | Freq: Two times a day (BID) | ORAL | 0 refills | Status: DC | PRN
  Filled 2022-12-17: qty 60, 30d supply, fill #0

## 2022-12-17 MED ORDER — ALPRAZOLAM 0.25 MG PO TABS: 0.2500 mg | ORAL_TABLET | Freq: Two times a day (BID) | ORAL | 0 refills | Status: DC | PRN

## 2022-12-17 MED ORDER — TIZANIDINE HCL 4 MG PO TABS
4.0000 mg | ORAL_TABLET | Freq: Three times a day (TID) | ORAL | 1 refills | Status: DC
  Filled 2022-12-17: qty 90, 30d supply, fill #0
  Filled 2023-02-21: qty 90, 30d supply, fill #1

## 2022-12-17 MED ORDER — ATORVASTATIN CALCIUM 20 MG PO TABS
ORAL_TABLET | ORAL | 1 refills | Status: DC
  Filled 2022-12-17: qty 90, 90d supply, fill #0
  Filled 2023-05-11: qty 90, 90d supply, fill #1

## 2022-12-17 MED ORDER — CLOPIDOGREL BISULFATE 75 MG PO TABS
ORAL_TABLET | Freq: Every day | ORAL | 1 refills | Status: DC
  Filled 2022-12-17: qty 90, 90d supply, fill #0
  Filled 2023-05-13: qty 90, 90d supply, fill #1

## 2022-12-17 MED ORDER — PANTOPRAZOLE SODIUM 40 MG PO TBEC
DELAYED_RELEASE_TABLET | Freq: Every day | ORAL | 3 refills | Status: DC
  Filled 2022-12-17: qty 90, 90d supply, fill #0
  Filled 2023-03-30: qty 90, 90d supply, fill #1
  Filled 2023-06-28: qty 90, 90d supply, fill #2
  Filled 2023-09-30: qty 90, 90d supply, fill #3

## 2022-12-17 MED ORDER — DULOXETINE HCL 30 MG PO CPEP
ORAL_CAPSULE | Freq: Two times a day (BID) | ORAL | 1 refills | Status: DC
  Filled 2022-12-17: qty 180, 90d supply, fill #0
  Filled 2023-04-03: qty 180, 90d supply, fill #1

## 2022-12-17 NOTE — Assessment & Plan Note (Signed)
Symptoms resolved.  No logner taking Flomax    Lab Results  Component Value Date   PSA 0.17 01/04/2022   PSA 0.12 06/11/2020   PSA 0.26 11/15/2018

## 2022-12-17 NOTE — Assessment & Plan Note (Signed)
Removed during 2023 colonoscopy

## 2022-12-17 NOTE — Progress Notes (Unsigned)
Subjective:  Patient ID: Tyrone Mcdowell, male    DOB: 1956/09/03  Age: 67 y.o. MRN: [id number]  CC: The primary encounter diagnosis was Primary hypertension. Diagnoses of Hyperlipidemia LDL goal <100, Iron deficiency, Impaired fasting glucose, Other fatigue, and Prostate cancer screening were also pertinent to this visit.   HPI Tyrone Mcdowell presents for  Chief Complaint  Patient presents with   Medical Management of Chronic Issues    3 month follow up for medication refills   1) Chronic pain with narcotic dependence:    reviewed plan 3 months ago to dc oxycontin and continue hydrocodone three times daily with gabapentin and celebrex added  Refill history confirmed via [place] Controlled Substance databas, accessed by me today.. last refill of HC was jan 8 .    2) GAD : using alprazolam prn max use 15/month 0.25 mg dose last refill dec 15   3) tobacco abuse:  chantix prescribed in November    Outpatient Medications Prior to Visit  Medication Sig Dispense Refill   albuterol (VENTOLIN HFA) 108 (90 Base) MCG/ACT inhaler Inhale 2 puffs into the lungs every 6 (six) hours as needed for wheezing or shortness of breath. 8.5 g 11   ALPRAZolam (XANAX) 0.25 MG tablet Take 1 tablet (0.25 mg total) by mouth at bedtime as needed for anxiety. Not more than 3 times per week 15 tablet 5   amLODipine (NORVASC) 10 MG tablet TAKE 1 TABLET BY MOUTH DAILY 90 tablet 1   atorvastatin (LIPITOR) 20 MG tablet TAKE 1 TABLET BY MOUTH DAILY AT 6 PM. 90 tablet 1   celecoxib (CELEBREX) 200 MG capsule Take 1 capsule (200 mg total) by mouth daily. 30 capsule 2   cloNIDine (CATAPRES) 0.1 MG tablet Take 1 tablet (0.1 mg total) by mouth 3 (three) times daily. AS NEEDED FOR bp > 150/90 90 tablet 3   clopidogrel (PLAVIX) 75 MG tablet TAKE 1 TABLET BY MOUTH DAILY. 90 tablet 1   DULoxetine (CYMBALTA) 30 MG capsule TAKE 1 CAPSULE BY MOUTH TWICE DAILY 180 capsule 1   furosemide (LASIX) 20 MG tablet Take 1 tablet (20 mg total)  by mouth daily as needed. 90 tablet 2   gabapentin (NEURONTIN) 300 MG capsule Take 2 capsules (600 mg total) by mouth 3 (three) times daily. 180 capsule 2   HYDROcodone-acetaminophen (NORCO) 10-325 MG tablet Take 1 tablet by mouth every 8 (eight) hours as needed. 90 tablet 0   montelukast (SINGULAIR) 10 MG tablet Take 1 tablet (10 mg total) by mouth at bedtime. 90 tablet 3   pantoprazole (PROTONIX) 40 MG tablet TAKE 1 TABLET BY MOUTH DAILY. 90 tablet 3   potassium chloride SA (KLOR-CON M) 20 MEQ tablet One tablet daily ,  one extra daily as needed 100 tablet 3   tiZANidine (ZANAFLEX) 4 MG tablet Take 1 tablet (4 mg total) by mouth 3 (three) times daily. 90 tablet 1   valsartan-hydrochlorothiazide (DIOVAN-HCT) 80-12.5 MG tablet TAKE 2 TABLETS BY MOUTH DAILY. 180 tablet 1   oxyCODONE (OXYCONTIN) 10 mg 12 hr tablet Take 1 tablet (10 mg total) by mouth daily. 30 tablet 0   Varenicline Tartrate, Starter, (CHANTIX STARTING MONTH PAK) 0.5 MG X 11 & 1 MG X 42 TBPK Take as directed on box (Patient not taking: Reported on 12/17/2022) 53 each 0   No facility-administered medications prior to visit.    Review of Systems;  Patient denies headache, fevers, malaise, unintentional weight loss, skin rash, eye pain, sinus congestion and  sinus pain, sore throat, dysphagia,  hemoptysis , cough, dyspnea, wheezing, chest pain, palpitations, orthopnea, edema, abdominal pain, nausea, melena, diarrhea, constipation, flank pain, dysuria, hematuria, urinary  Frequency, nocturia, numbness, tingling, seizures,  Focal weakness, Loss of consciousness,  Tremor, insomnia, depression, anxiety, and suicidal ideation.      Objective:  BP 100/70   Pulse (!) 58   Temp 97.6 F (36.4 C) (Oral)   Ht '5\' 10"'$  (1.778 m)   Wt 221 lb 3.2 oz (100.3 kg)   SpO2 97%   BMI 31.74 kg/m   BP Readings from Last 3 Encounters:  12/17/22 100/70  09/16/22 112/70  07/13/22 (!) 169/92    Wt Readings from Last 3 Encounters:  12/17/22 221 lb  3.2 oz (100.3 kg)  09/16/22 222 lb 3.2 oz (100.8 kg)  07/13/22 219 lb (99.3 kg)    Physical Exam  Lab Results  Component Value Date   HGBA1C 5.6 01/04/2022   HGBA1C 5.6 06/12/2020   HGBA1C 5.5 05/09/2018    Lab Results  Component Value Date   CREATININE 0.75 10/13/2022   CREATININE 0.75 05/13/2022   CREATININE 0.87 02/16/2022    Lab Results  Component Value Date   WBC 5.3 10/13/2022   HGB 13.7 10/13/2022   HCT 41.8 10/13/2022   PLT 266.0 10/13/2022   GLUCOSE 75 10/13/2022   CHOL 159 10/13/2022   TRIG 76 10/13/2022   HDL 55 10/13/2022   LDLDIRECT 142.8 10/23/2012   LDLCALC 87 10/13/2022   ALT 12 10/13/2022   AST 16 10/13/2022   NA 142 10/13/2022   K 3.9 10/13/2022   CL 101 10/13/2022   CREATININE 0.75 10/13/2022   BUN 13 10/13/2022   CO2 36 (H) 10/13/2022   TSH 1.21 11/07/2017   PSA 0.17 01/04/2022   INR 1.07 05/08/2018   HGBA1C 5.6 01/04/2022   MICROALBUR <0.7 01/04/2022    No results found.  Assessment & Plan:  .Primary hypertension  Hyperlipidemia LDL goal <100  Iron deficiency  Impaired fasting glucose  Other fatigue  Prostate cancer screening     I provided 30 minutes of face-to-face time during this encounter reviewing patient's last visit with me, patient's  most recent visit with cardiology,  nephrology,  and neurology,  recent surgical and non surgical procedures, previous  labs and imaging studies, counseling on currently addressed issues,  and post visit ordering to diagnostics and therapeutics .   Follow-up: No follow-ups on file.   [name], MD

## 2022-12-17 NOTE — Assessment & Plan Note (Addendum)
He is gradually reducing his daily with weeky reductions by one cigarette.  He  did not tolerate trial of CHANTIX LAST MONTH,  STOPPED DUE TO NIGHTMARES

## 2022-12-19 NOTE — Assessment & Plan Note (Signed)
Well controlled on current regimen of amlodipine 10 mg and valsartan/hct  . Renal function is normal, no changes today.  Lab Results  Component Value Date   CREATININE 0.75 10/13/2022   Lab Results  Component Value Date   NA 142 10/13/2022   K 3.9 10/13/2022   CL 101 10/13/2022   CO2 36 (H) 10/13/2022

## 2022-12-19 NOTE — Assessment & Plan Note (Signed)
He declines daily treatment as he feels that his symptoms are reactive to the situation at home. He is using  alprazolam  less than once daily to avoid conflict at home turning into shouting matches.   Refills given The risks and benefits of benzodiazepine use were reviewed with patient today including excessive sedation leading to respiratory depression,  impaired thinking/driving, and addiction.  Patient was advised to avoid concurrent use with alcohol, to use medication only as needed and not to share with others  .

## 2022-12-19 NOTE — Assessment & Plan Note (Signed)
We have reduced  his use of narcotics to only hydrocodone/apap for use every 8 hours,. Continue celebrex .

## 2022-12-19 NOTE — Assessment & Plan Note (Signed)
With hypertension.   I have encouraged him to continue efforts at   Continued weight loss with goal of 10% of body weigh over the next 6 months using a low glycemic index diet and regular exercise a minimum of 5 days per week.

## 2022-12-20 ENCOUNTER — Other Ambulatory Visit: Payer: Self-pay

## 2023-01-04 ENCOUNTER — Other Ambulatory Visit: Payer: Self-pay

## 2023-01-11 ENCOUNTER — Other Ambulatory Visit: Payer: Self-pay

## 2023-01-11 MED FILL — Valsartan-Hydrochlorothiazide Tab 80-12.5 MG: ORAL | 90 days supply | Qty: 180 | Fill #1 | Status: AC

## 2023-01-17 ENCOUNTER — Other Ambulatory Visit (INDEPENDENT_AMBULATORY_CARE_PROVIDER_SITE_OTHER): Payer: 59

## 2023-01-17 DIAGNOSIS — Z125 Encounter for screening for malignant neoplasm of prostate: Secondary | ICD-10-CM | POA: Diagnosis not present

## 2023-01-17 DIAGNOSIS — I10 Essential (primary) hypertension: Secondary | ICD-10-CM

## 2023-01-17 DIAGNOSIS — E785 Hyperlipidemia, unspecified: Secondary | ICD-10-CM | POA: Diagnosis not present

## 2023-01-17 DIAGNOSIS — R7301 Impaired fasting glucose: Secondary | ICD-10-CM

## 2023-01-17 DIAGNOSIS — E611 Iron deficiency: Secondary | ICD-10-CM

## 2023-01-17 DIAGNOSIS — R5383 Other fatigue: Secondary | ICD-10-CM

## 2023-01-17 LAB — COMPREHENSIVE METABOLIC PANEL
ALT: 25 U/L (ref 0–53)
AST: 19 U/L (ref 0–37)
Albumin: 3.9 g/dL (ref 3.5–5.2)
Alkaline Phosphatase: 92 U/L (ref 39–117)
BUN: 11 mg/dL (ref 6–23)
CO2: 32 mEq/L (ref 19–32)
Calcium: 9.4 mg/dL (ref 8.4–10.5)
Chloride: 100 mEq/L (ref 96–112)
Creatinine, Ser: 0.79 mg/dL (ref 0.40–1.50)
GFR: 94.01 mL/min (ref >60.00)
Glucose, Bld: 96 mg/dL (ref 70–99)
Potassium: 4 mEq/L (ref 3.5–5.1)
Sodium: 140 mEq/L (ref 135–145)
Total Bilirubin: 0.4 mg/dL (ref 0.2–1.2)
Total Protein: 6 g/dL (ref 6.0–8.3)

## 2023-01-17 LAB — CBC WITH DIFFERENTIAL/PLATELET
Basophils Absolute: 0 10*3/uL (ref 0.0–0.1)
Basophils Relative: 0.6 % (ref 0.0–3.0)
Eosinophils Absolute: 0.1 10*3/uL (ref 0.0–0.7)
Eosinophils Relative: 2.6 % (ref 0.0–5.0)
HCT: 41.5 % (ref 39.0–52.0)
Hemoglobin: 14.2 g/dL (ref 13.0–17.0)
Lymphocytes Relative: 30.1 % (ref 12.0–46.0)
Lymphs Abs: 1.6 10*3/uL (ref 0.7–4.0)
MCHC: 34.2 g/dL (ref 30.0–36.0)
MCV: 95.8 fl (ref 78.0–100.0)
Monocytes Absolute: 0.5 10*3/uL (ref 0.1–1.0)
Monocytes Relative: 9 % (ref 3.0–12.0)
Neutro Abs: 3.1 10*3/uL (ref 1.4–7.7)
Neutrophils Relative %: 57.7 % (ref 43.0–77.0)
Platelets: 301 10*3/uL (ref 150.0–400.0)
RBC: 4.33 Mil/uL (ref 4.22–5.81)
RDW: 13.5 % (ref 11.5–15.5)
WBC: 5.4 10*3/uL (ref 4.0–10.5)

## 2023-01-17 LAB — LIPID PANEL
Cholesterol: 163 mg/dL (ref 0–200)
HDL: 48 mg/dL (ref >39.00)
LDL Cholesterol: 102 mg/dL — ABNORMAL HIGH (ref 0–99)
NonHDL: 115.49
Total CHOL/HDL Ratio: 3
Triglycerides: 67 mg/dL (ref 0.0–149.0)
VLDL: 13.4 mg/dL (ref 0.0–40.0)

## 2023-01-17 LAB — TSH: TSH: 1.63 u[IU]/mL (ref 0.35–5.50)

## 2023-01-17 LAB — MICROALBUMIN / CREATININE URINE RATIO
Creatinine,U: 27.4 mg/dL
Microalb Creat Ratio: 2.6 mg/g (ref 0.0–30.0)
Microalb, Ur: <0.7 mg/dL (ref 0.0–1.9)

## 2023-01-17 LAB — LDL CHOLESTEROL, DIRECT: Direct LDL: 86 mg/dL

## 2023-01-17 LAB — HEMOGLOBIN A1C: Hgb A1c MFr Bld: 5.3 % (ref 4.6–6.5)

## 2023-01-17 LAB — PSA: PSA: 0.15 ng/mL (ref 0.10–4.00)

## 2023-01-18 ENCOUNTER — Other Ambulatory Visit: Payer: Self-pay | Admitting: Internal Medicine

## 2023-01-18 ENCOUNTER — Other Ambulatory Visit: Payer: Self-pay

## 2023-01-18 ENCOUNTER — Other Ambulatory Visit: Payer: 59

## 2023-01-19 ENCOUNTER — Other Ambulatory Visit: Payer: Self-pay

## 2023-01-19 MED FILL — Alprazolam Tab 0.25 MG: ORAL | 30 days supply | Qty: 30 | Fill #0 | Status: AC

## 2023-01-24 ENCOUNTER — Other Ambulatory Visit: Payer: Self-pay | Admitting: Internal Medicine

## 2023-01-24 ENCOUNTER — Other Ambulatory Visit: Payer: Self-pay

## 2023-01-24 MED ORDER — CLONIDINE HCL 0.1 MG PO TABS
0.1000 mg | ORAL_TABLET | Freq: Three times a day (TID) | ORAL | 3 refills | Status: DC
  Filled 2023-01-24: qty 90, 30d supply, fill #0
  Filled 2023-03-11: qty 90, 30d supply, fill #1
  Filled 2023-07-08 – 2023-07-25 (×2): qty 90, 30d supply, fill #2
  Filled 2023-08-22 – 2023-09-13 (×2): qty 90, 30d supply, fill #3

## 2023-01-24 MED ORDER — GABAPENTIN 300 MG PO CAPS
600.0000 mg | ORAL_CAPSULE | Freq: Three times a day (TID) | ORAL | 2 refills | Status: DC
  Filled 2023-01-24: qty 180, 30d supply, fill #0
  Filled 2023-02-21: qty 180, 30d supply, fill #1
  Filled 2023-03-25: qty 180, 30d supply, fill #2

## 2023-01-28 ENCOUNTER — Other Ambulatory Visit: Payer: Self-pay

## 2023-02-06 ENCOUNTER — Other Ambulatory Visit: Payer: Self-pay | Admitting: Internal Medicine

## 2023-02-07 ENCOUNTER — Other Ambulatory Visit: Payer: Self-pay

## 2023-02-07 MED ORDER — HYDROCODONE-ACETAMINOPHEN 10-325 MG PO TABS
1.0000 | ORAL_TABLET | Freq: Three times a day (TID) | ORAL | 0 refills | Status: DC | PRN
  Filled 2023-02-07 – 2023-02-10 (×4): qty 90, 30d supply, fill #0

## 2023-02-09 ENCOUNTER — Other Ambulatory Visit: Payer: Self-pay

## 2023-02-10 ENCOUNTER — Other Ambulatory Visit: Payer: Self-pay

## 2023-02-21 ENCOUNTER — Other Ambulatory Visit: Payer: Self-pay

## 2023-02-25 ENCOUNTER — Other Ambulatory Visit: Payer: Self-pay

## 2023-02-25 ENCOUNTER — Other Ambulatory Visit: Payer: Self-pay | Admitting: Internal Medicine

## 2023-02-25 NOTE — Telephone Encounter (Signed)
Refilled: 02/07/2023 Last OV: 12/17/2022 Next OV: 03/18/2023

## 2023-03-02 ENCOUNTER — Other Ambulatory Visit: Payer: Self-pay

## 2023-03-02 MED FILL — Alprazolam Tab 0.25 MG: ORAL | 30 days supply | Qty: 30 | Fill #1 | Status: AC

## 2023-03-09 ENCOUNTER — Other Ambulatory Visit: Payer: Self-pay

## 2023-03-09 ENCOUNTER — Other Ambulatory Visit: Payer: Self-pay | Admitting: Internal Medicine

## 2023-03-11 ENCOUNTER — Other Ambulatory Visit: Payer: Self-pay

## 2023-03-11 MED ORDER — HYDROCODONE-ACETAMINOPHEN 10-325 MG PO TABS
1.0000 | ORAL_TABLET | Freq: Three times a day (TID) | ORAL | 0 refills | Status: DC | PRN
  Filled 2023-03-11 – 2023-03-14 (×2): qty 90, 30d supply, fill #0

## 2023-03-11 NOTE — Telephone Encounter (Signed)
Requesting: Hydrocodone-acetaminophen 10-325  Contract: Yes UDS: no Last Visit: 12/17/2022 Next Visit: 03/18/2023 Last Refill: 02/10/2023  Please Advise

## 2023-03-13 ENCOUNTER — Other Ambulatory Visit: Payer: Self-pay | Admitting: Internal Medicine

## 2023-03-14 ENCOUNTER — Other Ambulatory Visit: Payer: Self-pay

## 2023-03-14 ENCOUNTER — Other Ambulatory Visit: Payer: Self-pay | Admitting: Internal Medicine

## 2023-03-14 MED FILL — Celecoxib Cap 200 MG: ORAL | 30 days supply | Qty: 30 | Fill #0 | Status: AC

## 2023-03-18 ENCOUNTER — Encounter: Payer: Self-pay | Admitting: Internal Medicine

## 2023-03-18 ENCOUNTER — Other Ambulatory Visit: Payer: Self-pay

## 2023-03-18 ENCOUNTER — Ambulatory Visit (INDEPENDENT_AMBULATORY_CARE_PROVIDER_SITE_OTHER): Payer: 59 | Admitting: Internal Medicine

## 2023-03-18 VITALS — BP 130/88 | HR 58 | Temp 98.0°F | Ht 70.0 in | Wt 212.4 lb

## 2023-03-18 DIAGNOSIS — F411 Generalized anxiety disorder: Secondary | ICD-10-CM | POA: Diagnosis not present

## 2023-03-18 DIAGNOSIS — K760 Fatty (change of) liver, not elsewhere classified: Secondary | ICD-10-CM | POA: Diagnosis not present

## 2023-03-18 DIAGNOSIS — I10 Essential (primary) hypertension: Secondary | ICD-10-CM

## 2023-03-18 DIAGNOSIS — F1120 Opioid dependence, uncomplicated: Secondary | ICD-10-CM | POA: Diagnosis not present

## 2023-03-18 MED ORDER — HYDROCODONE-ACETAMINOPHEN 10-325 MG PO TABS
1.0000 | ORAL_TABLET | Freq: Three times a day (TID) | ORAL | 0 refills | Status: DC | PRN
  Filled 2023-03-18: qty 90, 30d supply, fill #0

## 2023-03-18 MED ORDER — VALSARTAN-HYDROCHLOROTHIAZIDE 160-25 MG PO TABS
1.0000 | ORAL_TABLET | Freq: Every day | ORAL | 3 refills | Status: DC
  Filled 2023-03-18: qty 90, 90d supply, fill #0
  Filled 2023-05-24: qty 90, 90d supply, fill #1

## 2023-03-18 MED ORDER — AMLODIPINE BESYLATE 5 MG PO TABS
5.0000 mg | ORAL_TABLET | Freq: Every day | ORAL | 1 refills | Status: DC
  Filled 2023-03-18: qty 90, 90d supply, fill #0
  Filled 2023-06-13: qty 90, 90d supply, fill #1

## 2023-03-18 MED ORDER — MONTELUKAST SODIUM 10 MG PO TABS
10.0000 mg | ORAL_TABLET | Freq: Every day | ORAL | 3 refills | Status: DC
  Filled 2023-03-18: qty 90, 90d supply, fill #0
  Filled 2023-06-13: qty 90, 90d supply, fill #1
  Filled 2023-09-13: qty 90, 90d supply, fill #2
  Filled 2023-12-12: qty 90, 90d supply, fill #3

## 2023-03-18 MED ORDER — HYDROCODONE-ACETAMINOPHEN 10-325 MG PO TABS
1.0000 | ORAL_TABLET | Freq: Three times a day (TID) | ORAL | 0 refills | Status: DC | PRN
  Filled 2023-03-18 – 2023-04-13 (×3): qty 90, 30d supply, fill #0

## 2023-03-18 MED ORDER — TIZANIDINE HCL 4 MG PO TABS
4.0000 mg | ORAL_TABLET | Freq: Three times a day (TID) | ORAL | 1 refills | Status: DC
  Filled 2023-03-18: qty 90, 30d supply, fill #0
  Filled 2023-05-06: qty 90, 30d supply, fill #1

## 2023-03-18 NOTE — Assessment & Plan Note (Signed)
With hypertension.   I have commended his efforts at  Continued weight loss Body mass index is 30.48 kg/m.

## 2023-03-18 NOTE — Progress Notes (Signed)
Subjective:  Patient ID: Tyrone Mcdowell, male    DOB: [birth year]  Age: 67 y.o. MRN: [id number]  CC: The primary encounter diagnosis was Chronic narcotic dependence. Diagnoses of Anxiety, generalized, Fatty liver, Morbid obesity, and Primary hypertension were also pertinent to this visit. [name]1) Chronic back pain managed with hydrocodone:  last refill April 5 written,  filled on April 8 for #90. Has been complying with gradual wean  dose reduced in Jan and cymbalta /celebrex added  .  On PPI  HPI SOHAN POTVIN presents for  Chief Complaint  Patient presents with   Medical Management of Chronic Issues    3 month follow up for medication refills    2) GAD:  using alprazolam 0.25 mg   [facility]hs last refill march 27 . Family stressors still present due to several children returning home and increased needs of grandson who has been having behavioral problems in high school   3 HTN:  amlodipine 10 mg daily  and ARB/HCCT and 1/2 maximal dose.  No home readings   4) obesity:  losing weight gradually, down 9 lb since January   Outpatient Medications Prior to Visit  Medication Sig Dispense Refill   albuterol (VENTOLIN HFA) 108 (90 Base) MCG/ACT inhaler Inhale 2 puffs into the lungs every 6 (six) hours as needed for wheezing or shortness of breath. 8.5 g 11   ALPRAZolam (XANAX) 0.25 MG tablet Take 1 tablet (0.25 mg total) by mouth at bedtime as needed for anxiety. 30 tablet 2   atorvastatin (LIPITOR) 20 MG tablet TAKE 1 TABLET BY MOUTH DAILY AT 6 PM. 90 tablet 1   celecoxib (CELEBREX) 200 MG capsule Take 1 capsule (200 mg total) by mouth daily. 30 capsule 2   cloNIDine (CATAPRES) 0.1 MG tablet Take 1 tablet (0.1 mg total) by mouth 3 (three) times daily. AS NEEDED FOR bp > 150/90 90 tablet 3   clopidogrel (PLAVIX) 75 MG tablet TAKE 1 TABLET BY MOUTH DAILY. 90 tablet 1   DULoxetine (CYMBALTA) 30 MG capsule TAKE 1 CAPSULE BY MOUTH TWICE DAILY 180 capsule 1   furosemide (LASIX) 20 MG tablet Take 1 tablet  (20 mg total) by mouth daily as needed. 90 tablet 2   gabapentin (NEURONTIN) 300 MG capsule Take 2 capsules (600 mg total) by mouth 3 (three) times daily. 180 capsule 2   pantoprazole (PROTONIX) 40 MG tablet TAKE 1 TABLET BY MOUTH DAILY. 90 tablet 3   potassium chloride SA (KLOR-CON M) 20 MEQ tablet One tablet daily ,  one extra daily as needed 100 tablet 3   amLODipine (NORVASC) 10 MG tablet TAKE 1 TABLET BY MOUTH DAILY 90 tablet 1   HYDROcodone-acetaminophen (NORCO) 10-325 MG tablet Take 1 tablet by mouth every 8 (eight) hours as needed. 90 tablet 0   montelukast (SINGULAIR) 10 MG tablet Take 1 tablet (10 mg total) by mouth at bedtime. 90 tablet 3   tiZANidine (ZANAFLEX) 4 MG tablet Take 1 tablet (4 mg total) by mouth 3 (three) times daily. 90 tablet 1   valsartan-hydrochlorothiazide (DIOVAN-HCT) 80-12.5 MG tablet TAKE 2 TABLETS BY MOUTH DAILY. 180 tablet 1   No facility-administered medications prior to visit.    Review of Systems;  Patient denies headache, fevers, malaise, unintentional weight loss, skin rash, eye pain, sinus congestion and sinus pain, sore throat, dysphagia,  hemoptysis , cough, dyspnea, wheezing, chest pain, palpitations, orthopnea, edema, abdominal pain, nausea, melena, diarrhea, constipation, flank pain, dysuria, hematuria, urinary  Frequency, nocturia, numbness, tingling, seizures,  Focal weakness, Loss of consciousness,  Tremor,  depression,  and suicidal ideation.      Objective:  BP 130/88   Pulse (!) 58   Temp 98 F (36.7 C) (Oral)   Ht 5\' 10"  (1.778 m)   Wt 212 lb 6.4 oz (96.3 kg)   SpO2 97%   BMI 30.48 kg/m   BP Readings from Last 3 Encounters:  03/18/23 130/88  12/17/22 100/70  09/16/22 112/70    Wt Readings from Last 3 Encounters:  03/18/23 212 lb 6.4 oz (96.3 kg)  12/17/22 221 lb 3.2 oz (100.3 kg)  09/16/22 222 lb 3.2 oz (100.8 kg)    Physical Exam Vitals reviewed.  Constitutional:      General: He is not in acute distress.     Appearance: Normal appearance. He is normal weight. He is not ill-appearing, toxic-appearing or diaphoretic.  HENT:     Head: Normocephalic.  Eyes:     General: No scleral icterus.       Right eye: No discharge.        Left eye: No discharge.     Conjunctiva/sclera: Conjunctivae normal.  Cardiovascular:     Rate and Rhythm: Normal rate and regular rhythm.     Heart sounds: Normal heart sounds.  Pulmonary:     Effort: Pulmonary effort is normal. No respiratory distress.     Breath sounds: Normal breath sounds.  Musculoskeletal:        General: Normal range of motion.     Cervical back: Normal range of motion.  Skin:    General: Skin is warm and dry.  Neurological:     General: No focal deficit present.     Mental Status: He is alert and oriented to person, place, and time. Mental status is at baseline.  Psychiatric:        Mood and Affect: Mood normal.        Behavior: Behavior normal.        Thought Content: Thought content normal.        Judgment: Judgment normal.   Lab Results  Component Value Date   HGBA1C 5.3 01/17/2023   HGBA1C 5.6 01/04/2022   HGBA1C 5.6 06/12/2020    Lab Results  Component Value Date   CREATININE 0.79 01/17/2023   CREATININE 0.75 10/13/2022   CREATININE 0.75 05/13/2022    Lab Results  Component Value Date   WBC 5.4 01/17/2023   HGB 14.2 01/17/2023   HCT 41.5 01/17/2023   PLT 301.0 01/17/2023   GLUCOSE 96 01/17/2023   CHOL 163 01/17/2023   TRIG 67.0 01/17/2023   HDL 48.00 01/17/2023   LDLDIRECT 86.0 01/17/2023   LDLCALC 102 (H) 01/17/2023   ALT 25 01/17/2023   AST 19 01/17/2023   NA 140 01/17/2023   K 4.0 01/17/2023   CL 100 01/17/2023   CREATININE 0.79 01/17/2023   BUN 11 01/17/2023   CO2 32 01/17/2023   TSH 1.63 01/17/2023   PSA 0.15 01/17/2023   INR 1.07 05/08/2018   HGBA1C 5.3 01/17/2023   MICROALBUR <0.7 01/17/2023    No results found.  Assessment & Plan:  .Chronic narcotic dependence Assessment & Plan: We have  reduced  his use of narcotics to only hydrocodone/apap for use every 8 hours,. Continue celebrex  and cymbalta.  Refill history confirmed via [place] Controlled Substance databas, accessed by me today.. he understands that requests to increase his narcotics will not be granted unless there is a new injury, and if not accompanied  by a new injury will be suggestive of increased tolerance and result in an opiate "holiday " to reduce tolerance.    Anxiety, generalized Assessment & Plan: He continues to decline daily treatment as he feels that his symptoms are reactive to the situation at home. He is using  alprazolam  less than once daily to avoid conflict at home turning into shouting matches.   Refills given . The risks and benefits of benzodiazepine use were reviewed with patient today including excessive sedation leading to respiratory depression,  impaired thinking/driving, and addiction.  Patient was advised to avoid concurrent use with alcohol, to use medication only as needed and not to share with others  .    Fatty liver Assessment & Plan: Presumed by ultrasound changes and serologies negative for autoimmune causes of hepatitis.  Current transaminases are normal and all modifiable risk factors including obesity  and hyperlipidemia have been addressed .annual repeat ultrasound ordered   Lab Results  Component Value Date   ALT 25 01/17/2023   AST 19 01/17/2023   ALKPHOS 92 01/17/2023   BILITOT 0.4 01/17/2023      Morbid obesity Assessment & Plan: With hypertension.   I have commended his efforts at  Continued weight loss Body mass index is 30.48 kg/m.    Primary hypertension Assessment & Plan: Medications adjusted today: telmisartan/hct increased (doubled) and amlodipine reduced to 5 mg daily    Other orders -     Montelukast Sodium; Take 1 tablet (10 mg total) by mouth at bedtime.  Dispense: 90 tablet; Refill: 3 -     tiZANidine HCl; Take 1 tablet (4 mg total) by mouth 3 (three)  times daily.  Dispense: 90 tablet; Refill: 1 -     Valsartan-hydroCHLOROthiazide; Take 1 tablet by mouth daily.  Dispense: 90 tablet; Refill: 3 -     amLODIPine Besylate; Take 1 tablet (5 mg total) by mouth daily.  Dispense: 90 tablet; Refill: 1 -     HYDROcodone-Acetaminophen; Take 1 tablet by mouth every 8 (eight) hours as needed.  Dispense: 90 tablet; Refill: 0     Follow-up: Return in about 3 months (around 06/17/2023) for chronic pain management.   Sherlene Shams, MD

## 2023-03-18 NOTE — Assessment & Plan Note (Signed)
Medications adjusted today: telmisartan/hct increased (doubled) and amlodipine reduced to 5 mg daily

## 2023-03-18 NOTE — Assessment & Plan Note (Signed)
We have reduced  his use of narcotics to only hydrocodone/apap for use every 8 hours,. Continue celebrex  and cymbalta.  Refill history confirmed via [place] Controlled Substance databas, accessed by me today.. he understands that requests to increase his narcotics will not be granted unless there is a new injury, and if not accompanied by a new injury will be suggestive of increased tolerance and result in an opiate "holiday " to reduce tolerance.

## 2023-03-18 NOTE — Assessment & Plan Note (Signed)
He continues to decline daily treatment as he feels that his symptoms are reactive to the situation at home. He is using  alprazolam  less than once daily to avoid conflict at home turning into shouting matches.   Refills given . The risks and benefits of benzodiazepine use were reviewed with patient today including excessive sedation leading to respiratory depression,  impaired thinking/driving, and addiction.  Patient was advised to avoid concurrent use with alcohol, to use medication only as needed and not to share with others  .

## 2023-03-18 NOTE — Assessment & Plan Note (Signed)
Presumed by ultrasound changes and serologies negative for autoimmune causes of hepatitis.  Current transaminases are normal and all modifiable risk factors including obesity  and hyperlipidemia have been addressed .annual repeat ultrasound ordered   Lab Results  Component Value Date   ALT 25 01/17/2023   AST 19 01/17/2023   ALKPHOS 92 01/17/2023   BILITOT 0.4 01/17/2023

## 2023-03-18 NOTE — Patient Instructions (Addendum)
Your blood pressure is above goal.  I am making the following changes today:  Reduce amlodipine to 1/2 table daily ( 5 mg )    new rx sent to [facility]     Double the valsartan/hct dose ( new rx will be for the higher dose sent to [facility]  )  Goal is 120/70 to 130/80  Send me a few readings in a couple of weeks

## 2023-03-25 ENCOUNTER — Other Ambulatory Visit: Payer: Self-pay

## 2023-03-25 MED FILL — Alprazolam Tab 0.25 MG: ORAL | 30 days supply | Qty: 30 | Fill #2 | Status: CN

## 2023-04-03 MED FILL — Alprazolam Tab 0.25 MG: ORAL | 30 days supply | Qty: 30 | Fill #2 | Status: AC

## 2023-04-04 ENCOUNTER — Other Ambulatory Visit: Payer: Self-pay

## 2023-04-08 MED FILL — Celecoxib Cap 200 MG: ORAL | 30 days supply | Qty: 30 | Fill #1 | Status: AC

## 2023-04-12 ENCOUNTER — Other Ambulatory Visit: Payer: Self-pay

## 2023-04-13 ENCOUNTER — Other Ambulatory Visit: Payer: Self-pay

## 2023-04-25 ENCOUNTER — Other Ambulatory Visit: Payer: Self-pay | Admitting: Internal Medicine

## 2023-04-26 ENCOUNTER — Other Ambulatory Visit: Payer: Self-pay

## 2023-04-26 MED ORDER — GABAPENTIN 300 MG PO CAPS
600.0000 mg | ORAL_CAPSULE | Freq: Three times a day (TID) | ORAL | 2 refills | Status: DC
  Filled 2023-04-26: qty 180, 30d supply, fill #0
  Filled 2023-05-24: qty 180, 30d supply, fill #1
  Filled 2023-06-27: qty 180, 30d supply, fill #2

## 2023-04-26 MED ORDER — ALPRAZOLAM 0.25 MG PO TABS
0.2500 mg | ORAL_TABLET | Freq: Every evening | ORAL | 2 refills | Status: DC | PRN
  Filled 2023-04-26 – 2023-05-04 (×2): qty 30, 30d supply, fill #0
  Filled 2023-06-09: qty 30, 30d supply, fill #1
  Filled 2023-07-24: qty 30, 30d supply, fill #2

## 2023-05-04 ENCOUNTER — Other Ambulatory Visit: Payer: Self-pay

## 2023-05-05 ENCOUNTER — Other Ambulatory Visit: Payer: Self-pay

## 2023-05-06 ENCOUNTER — Other Ambulatory Visit: Payer: Self-pay

## 2023-05-08 ENCOUNTER — Other Ambulatory Visit: Payer: Self-pay

## 2023-05-08 ENCOUNTER — Emergency Department
Admission: EM | Admit: 2023-05-08 | Discharge: 2023-05-08 | Disposition: A | Discharge status: Home / Self Care | Payer: 59 | Attending: Emergency Medicine | Admitting: Emergency Medicine

## 2023-05-08 ENCOUNTER — Emergency Department: Payer: 59

## 2023-05-08 DIAGNOSIS — J449 Chronic obstructive pulmonary disease, unspecified: Secondary | ICD-10-CM | POA: Diagnosis not present

## 2023-05-08 DIAGNOSIS — S2220XA Unspecified fracture of sternum, initial encounter for closed fracture: Secondary | ICD-10-CM | POA: Insufficient documentation

## 2023-05-08 DIAGNOSIS — I10 Essential (primary) hypertension: Secondary | ICD-10-CM | POA: Insufficient documentation

## 2023-05-08 DIAGNOSIS — R0789 Other chest pain: Secondary | ICD-10-CM | POA: Diagnosis not present

## 2023-05-08 DIAGNOSIS — S2222XA Fracture of body of sternum, initial encounter for closed fracture: Secondary | ICD-10-CM | POA: Diagnosis not present

## 2023-05-08 DIAGNOSIS — Y92410 Unspecified street and highway as the place of occurrence of the external cause: Secondary | ICD-10-CM | POA: Insufficient documentation

## 2023-05-08 DIAGNOSIS — Z8673 Personal history of transient ischemic attack (TIA), and cerebral infarction without residual deficits: Secondary | ICD-10-CM | POA: Insufficient documentation

## 2023-05-08 DIAGNOSIS — E876 Hypokalemia: Secondary | ICD-10-CM | POA: Diagnosis not present

## 2023-05-08 DIAGNOSIS — R079 Chest pain, unspecified: Secondary | ICD-10-CM | POA: Diagnosis present

## 2023-05-08 DIAGNOSIS — S2231XA Fracture of one rib, right side, initial encounter for closed fracture: Secondary | ICD-10-CM | POA: Diagnosis not present

## 2023-05-08 LAB — CBC WITH DIFFERENTIAL/PLATELET
Abs Immature Granulocytes: 0.02 10*3/uL (ref 0.00–0.07)
Basophils Absolute: 0 10*3/uL (ref 0.0–0.1)
Basophils Relative: 0 %
Eosinophils Absolute: 0.1 10*3/uL (ref 0.0–0.5)
Eosinophils Relative: 2 %
HCT: 40.4 % (ref 39.0–52.0)
Hemoglobin: 13.3 g/dL (ref 13.0–17.0)
Immature Granulocytes: 0 %
Lymphocytes Relative: 34 %
Lymphs Abs: 2.2 10*3/uL (ref 0.7–4.0)
MCH: 31.7 pg (ref 26.0–34.0)
MCHC: 32.9 g/dL (ref 30.0–36.0)
MCV: 96.2 fL (ref 80.0–100.0)
Monocytes Absolute: 0.7 10*3/uL (ref 0.1–1.0)
Monocytes Relative: 10 %
Neutro Abs: 3.4 10*3/uL (ref 1.7–7.7)
Neutrophils Relative %: 54 %
Platelets: 250 10*3/uL (ref 150–400)
RBC: 4.2 MIL/uL — ABNORMAL LOW (ref 4.22–5.81)
RDW: 13.7 % (ref 11.5–15.5)
WBC: 6.4 10*3/uL (ref 4.0–10.5)
nRBC: 0 % (ref 0.0–0.2)

## 2023-05-08 LAB — COMPREHENSIVE METABOLIC PANEL
ALT: 15 U/L (ref 0–44)
AST: 20 U/L (ref 15–41)
Albumin: 4 g/dL (ref 3.5–5.0)
Alkaline Phosphatase: 75 U/L (ref 38–126)
Anion gap: 10 (ref 5–15)
BUN: 10 mg/dL (ref 8–23)
CO2: 30 mmol/L (ref 22–32)
Calcium: 8.6 mg/dL — ABNORMAL LOW (ref 8.9–10.3)
Chloride: 99 mmol/L (ref 98–111)
Creatinine, Ser: 0.85 mg/dL (ref 0.61–1.24)
GFR, Estimated: >60 mL/min (ref >60)
Glucose, Bld: 79 mg/dL (ref 70–99)
Potassium: 2.9 mmol/L — ABNORMAL LOW (ref 3.5–5.1)
Sodium: 139 mmol/L (ref 135–145)
Total Bilirubin: 0.6 mg/dL (ref 0.3–1.2)
Total Protein: 6.6 g/dL (ref 6.5–8.1)

## 2023-05-08 LAB — TROPONIN I (HIGH SENSITIVITY)
Troponin I (High Sensitivity): 9 ng/L (ref <18)
Troponin I (High Sensitivity): 9 ng/L (ref <18)

## 2023-05-08 MED ORDER — POTASSIUM CHLORIDE CRYS ER 20 MEQ PO TBCR
20.0000 meq | EXTENDED_RELEASE_TABLET | Freq: Every day | ORAL | 0 refills | Status: DC
  Filled 2023-05-08: qty 20, fill #0
  Filled 2023-07-25 (×2): qty 20, 20d supply, fill #0

## 2023-05-08 MED ORDER — POTASSIUM CHLORIDE CRYS ER 20 MEQ PO TBCR
40.0000 meq | EXTENDED_RELEASE_TABLET | Freq: Once | ORAL | Status: AC
  Administered 2023-05-08: 40 meq via ORAL
  Filled 2023-05-08: qty 2

## 2023-05-08 MED FILL — Celecoxib Cap 200 MG: ORAL | 30 days supply | Qty: 30 | Fill #2 | Status: AC
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
## 2023-05-08 NOTE — ED Notes (Signed)
Report given to Cat RN
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
## 2023-05-09 ENCOUNTER — Other Ambulatory Visit: Payer: Self-pay

## 2023-05-11 ENCOUNTER — Other Ambulatory Visit: Payer: Self-pay | Admitting: Internal Medicine

## 2023-05-11 ENCOUNTER — Other Ambulatory Visit: Payer: Self-pay

## 2023-05-11 MED ORDER — HYDROCODONE-ACETAMINOPHEN 10-325 MG PO TABS
1.0000 | ORAL_TABLET | Freq: Three times a day (TID) | ORAL | 0 refills | Status: DC | PRN
  Filled 2023-05-11 – 2023-05-13 (×3): qty 90, 30d supply, fill #0

## 2023-05-13 ENCOUNTER — Encounter: Payer: Self-pay | Admitting: Internal Medicine

## 2023-05-13 ENCOUNTER — Other Ambulatory Visit: Payer: Self-pay

## 2023-05-13 ENCOUNTER — Telehealth: Payer: Self-pay

## 2023-05-13 DIAGNOSIS — S2220XA Unspecified fracture of sternum, initial encounter for closed fracture: Secondary | ICD-10-CM | POA: Insufficient documentation

## 2023-05-13 DIAGNOSIS — S2222XD Fracture of body of sternum, subsequent encounter for fracture with routine healing: Secondary | ICD-10-CM | POA: Insufficient documentation
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
## 2023-05-20 ENCOUNTER — Other Ambulatory Visit: Payer: Self-pay | Admitting: Internal Medicine

## 2023-05-20 ENCOUNTER — Other Ambulatory Visit: Payer: Self-pay

## 2023-05-20 MED ORDER — TIZANIDINE HCL 4 MG PO TABS
4.0000 mg | ORAL_TABLET | Freq: Three times a day (TID) | ORAL | 1 refills | Status: DC
  Filled 2023-05-20 – 2023-06-27 (×2): qty 90, 30d supply, fill #0
  Filled 2023-07-25: qty 90, 30d supply, fill #1

## 2023-05-24 ENCOUNTER — Other Ambulatory Visit: Payer: Self-pay | Admitting: Internal Medicine

## 2023-05-24 ENCOUNTER — Telehealth: Payer: Self-pay

## 2023-05-24 ENCOUNTER — Other Ambulatory Visit: Payer: Self-pay

## 2023-05-24 DIAGNOSIS — I10 Essential (primary) hypertension: Secondary | ICD-10-CM

## 2023-05-24 MED ORDER — VALSARTAN-HYDROCHLOROTHIAZIDE 160-25 MG PO TABS
1.0000 | ORAL_TABLET | Freq: Two times a day (BID) | ORAL | 3 refills | Status: DC
  Filled 2023-05-24: qty 180, 90d supply, fill #0
  Filled 2023-08-19: qty 180, 90d supply, fill #1
  Filled 2023-11-25: qty 180, 90d supply, fill #2

## 2023-05-24 MED ORDER — VALSARTAN-HYDROCHLOROTHIAZIDE 320-25 MG PO TABS
1.0000 | ORAL_TABLET | Freq: Every day | ORAL | 3 refills | Status: DC
  Filled 2023-05-24: qty 90, 90d supply, fill #0

## 2023-05-24 NOTE — Assessment & Plan Note (Signed)
Medications adjusted today: telmisartan/hct increased (doubled) to 320 mg daily and amlodipine reduced to 5 mg daily

## 2023-05-24 NOTE — Telephone Encounter (Signed)
Prescription Request  05/24/2023  LOV: Visit date not found  What is the name of the medication or equipment? valsartan-hydrochlorothiazide (DIOVAN-HCT) 160-25 MG tablet  Have you contacted your pharmacy to request a refill? Yes   Which pharmacy would you like this sent to?   [facility] [address] [place] [address] Phone: [phone] Fax: [phone]    Patient notified that their request is being sent to the clinical staff for review and that they should receive a response within 2 business days.   Please advise at Mobile [phone] (mobile)  Patient called to state at patient's last visit, they changed the dosage to one tablet two times a day, but the pharmacy still has it as one tablet once a day, so they will not refill the prescription.  Patient states he is out of this medication.

## 2023-05-24 NOTE — Telephone Encounter (Signed)
Rx that was sent in was for the Valsartan-hydrochlorothiazide 160-25 for one tablet a day. That is what pt was taking at his last visit when he was told to increases the dose. When he increased the dose he was taking a 160-25 in the AM and a 160-25 at lunch time, so he has been taking a total of 320-50 mg since he was told to increase the dose. Pt's wife would like to keep the rx as the 160-25 two tablets daily. She stated that when he took all of it together in the morning he would get dizzy but since he is taking one the AM and one at lunch he is not getting dizzy.

## 2023-05-26 NOTE — Telephone Encounter (Signed)
Pt and pts wife is aware. 

## 2023-06-09 ENCOUNTER — Other Ambulatory Visit: Payer: Self-pay | Admitting: Internal Medicine

## 2023-06-09 ENCOUNTER — Other Ambulatory Visit: Payer: Self-pay

## 2023-06-10 ENCOUNTER — Other Ambulatory Visit: Payer: Self-pay

## 2023-06-10 MED FILL — Hydrocodone-Acetaminophen Tab 10-325 MG: ORAL | 30 days supply | Qty: 90 | Fill #0 | Status: AC

## 2023-06-10 MED FILL — Celecoxib Cap 200 MG: ORAL | 30 days supply | Qty: 30 | Fill #0 | Status: CN

## 2023-06-10 MED FILL — Celecoxib Cap 200 MG: ORAL | 30 days supply | Qty: 30 | Fill #0 | Status: AC

## 2023-06-17 ENCOUNTER — Encounter: Payer: Self-pay | Admitting: Internal Medicine

## 2023-06-17 ENCOUNTER — Ambulatory Visit (INDEPENDENT_AMBULATORY_CARE_PROVIDER_SITE_OTHER): Payer: 59 | Admitting: Internal Medicine

## 2023-06-17 ENCOUNTER — Other Ambulatory Visit: Payer: Self-pay

## 2023-06-17 VITALS — BP 140/80 | HR 57 | Temp 97.8°F | Resp 17 | Ht 70.0 in | Wt 208.1 lb

## 2023-06-17 DIAGNOSIS — S2222XD Fracture of body of sternum, subsequent encounter for fracture with routine healing: Secondary | ICD-10-CM | POA: Diagnosis not present

## 2023-06-17 DIAGNOSIS — E876 Hypokalemia: Secondary | ICD-10-CM | POA: Diagnosis not present

## 2023-06-17 DIAGNOSIS — F411 Generalized anxiety disorder: Secondary | ICD-10-CM

## 2023-06-17 DIAGNOSIS — F1120 Opioid dependence, uncomplicated: Secondary | ICD-10-CM

## 2023-06-17 DIAGNOSIS — I10 Essential (primary) hypertension: Secondary | ICD-10-CM

## 2023-06-17 DIAGNOSIS — M5417 Radiculopathy, lumbosacral region: Secondary | ICD-10-CM

## 2023-06-17 LAB — BASIC METABOLIC PANEL
BUN: 13 mg/dL (ref 6–23)
CO2: 35 mEq/L — ABNORMAL HIGH (ref 19–32)
Calcium: 9.6 mg/dL (ref 8.4–10.5)
Chloride: 97 mEq/L (ref 96–112)
Creatinine, Ser: 0.82 mg/dL (ref 0.40–1.50)
GFR: 92.69 mL/min (ref >60.00)
Glucose, Bld: 92 mg/dL (ref 70–99)
Potassium: 4.1 mEq/L (ref 3.5–5.1)
Sodium: 138 mEq/L (ref 135–145)

## 2023-06-17 LAB — MAGNESIUM: Magnesium: 1.8 mg/dL (ref 1.5–2.5)

## 2023-06-17 MED ORDER — AMLODIPINE BESYLATE 5 MG PO TABS
5.0000 mg | ORAL_TABLET | Freq: Every day | ORAL | 1 refills | Status: DC
  Filled 2023-06-17: qty 90, 90d supply, fill #0

## 2023-06-17 MED ORDER — HYDROCODONE-ACETAMINOPHEN 10-325 MG PO TABS
1.0000 | ORAL_TABLET | Freq: Three times a day (TID) | ORAL | 0 refills | Status: DC | PRN
  Filled 2023-06-17 – 2023-07-13 (×3): qty 90, 30d supply, fill #0

## 2023-06-17 MED ORDER — AMLODIPINE BESYLATE 10 MG PO TABS
10.0000 mg | ORAL_TABLET | Freq: Every day | ORAL | 1 refills | Status: DC
  Filled 2023-06-17: qty 90, 90d supply, fill #0
  Filled 2023-10-20: qty 90, 90d supply, fill #1

## 2023-06-17 MED ORDER — DULOXETINE HCL 30 MG PO CPEP
30.0000 mg | ORAL_CAPSULE | Freq: Two times a day (BID) | ORAL | 1 refills | Status: DC
  Filled 2023-06-17: qty 180, 90d supply, fill #0
  Filled 2023-09-24: qty 180, 90d supply, fill #1

## 2023-06-17 MED ORDER — CLOPIDOGREL BISULFATE 75 MG PO TABS
75.0000 mg | ORAL_TABLET | Freq: Every day | ORAL | 1 refills | Status: DC
  Filled 2023-06-17: qty 90, fill #0
  Filled 2023-06-27 – 2023-07-25 (×5): qty 90, 90d supply, fill #0
  Filled 2023-10-20: qty 90, 90d supply, fill #1

## 2023-06-17 MED ORDER — ATORVASTATIN CALCIUM 20 MG PO TABS
20.0000 mg | ORAL_TABLET | Freq: Every day | ORAL | 1 refills | Status: DC
  Filled 2023-06-17: qty 90, fill #0
  Filled 2023-06-27 – 2023-07-25 (×6): qty 90, 90d supply, fill #0
  Filled 2023-10-20: qty 90, 90d supply, fill #1
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
## 2023-06-17 NOTE — Patient Instructions (Signed)
I AM INCREASING YOUR AMLODIPINE TO 10 MG DAILY TO LOWER YOUR BLOOD PRESSURE TO 130/80 OR LESS

## 2023-06-18 ENCOUNTER — Encounter: Payer: Self-pay | Admitting: Internal Medicine

## 2023-06-18 DIAGNOSIS — E876 Hypokalemia: Secondary | ICD-10-CM | POA: Insufficient documentation

## 2023-06-18 NOTE — Assessment & Plan Note (Addendum)
He is taking cymbalta for pain management but may be benefitting in other ways from the medition He is using  alprazolam  less than once daily to avoid conflict at home turning into shouting matches.   Refills given . The risks and benefits of benzodiazepine use were reviewed with patient today including excessive sedation leading to respiratory depression,  impaired thinking/driving, and addiction.  Patient was advised to avoid concurrent use with alcohol, to use medication only as needed and not to share with others  .
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
## 2023-06-18 NOTE — Assessment & Plan Note (Signed)
Continue  celebrex and reducition in use of narcotics gradually given lack of evidence of spinal stenosis by last mri

## 2023-06-18 NOTE — Assessment & Plan Note (Signed)
Noted during ER evaluation for sternal fracture.  Now normalized .  Magnesium normal.    Lab Results  Component Value Date   NA 138 06/17/2023   K 4.1 06/17/2023   CL 97 06/17/2023   CO2 35 (H) 06/17/2023

## 2023-06-18 NOTE — Assessment & Plan Note (Addendum)
Not at goal on 320 mg VALSARTA,  50 mg hydrochlorothiazide and 2.5 mg amlodipine . Increase amlodipine to 5 mg daily

## 2023-06-18 NOTE — Assessment & Plan Note (Signed)
We have reduced  his use of narcotics to only hydrocodone/apap for use every 8 hours,. Continue celebrex  and cymbalta.  Refill history confirmed via [place] Controlled Substance databas, accessed by me today.. he  did not require an increase in his dosing of narcotics when he fractured his sternum .  Will resume weaning at next visit

## 2023-06-27 ENCOUNTER — Other Ambulatory Visit: Payer: Self-pay

## 2023-06-28 ENCOUNTER — Other Ambulatory Visit: Payer: Self-pay

## 2023-06-28 ENCOUNTER — Encounter: Payer: Self-pay | Admitting: Pharmacist

## 2023-07-03 ENCOUNTER — Other Ambulatory Visit: Payer: Self-pay

## 2023-07-05 ENCOUNTER — Other Ambulatory Visit: Payer: Self-pay

## 2023-07-08 ENCOUNTER — Other Ambulatory Visit: Payer: Self-pay

## 2023-07-08 MED FILL — Celecoxib Cap 200 MG: ORAL | 30 days supply | Qty: 30 | Fill #1 | Status: AC

## 2023-07-13 ENCOUNTER — Other Ambulatory Visit: Payer: Self-pay

## 2023-07-25 ENCOUNTER — Other Ambulatory Visit: Payer: Self-pay | Admitting: Internal Medicine

## 2023-07-25 ENCOUNTER — Other Ambulatory Visit: Payer: Self-pay

## 2023-07-26 ENCOUNTER — Other Ambulatory Visit: Payer: Self-pay

## 2023-07-26 MED ORDER — GABAPENTIN 300 MG PO CAPS
600.0000 mg | ORAL_CAPSULE | Freq: Three times a day (TID) | ORAL | 2 refills | Status: DC
  Filled 2023-07-26: qty 180, 30d supply, fill #0
  Filled 2023-08-22: qty 180, 30d supply, fill #1
  Filled 2023-09-24: qty 180, 30d supply, fill #2

## 2023-07-28 ENCOUNTER — Other Ambulatory Visit: Payer: Self-pay

## 2023-07-28 MED FILL — Celecoxib Cap 200 MG: ORAL | 30 days supply | Qty: 30 | Fill #2 | Status: CN

## 2023-08-02 MED FILL — Celecoxib Cap 200 MG: ORAL | 30 days supply | Qty: 30 | Fill #2 | Status: AC

## 2023-08-10 ENCOUNTER — Telehealth: Payer: Self-pay | Admitting: Gastroenterology

## 2023-08-10 NOTE — Telephone Encounter (Signed)
Patient called to to check on what type of appointment he was having.

## 2023-08-11 ENCOUNTER — Other Ambulatory Visit: Payer: Self-pay | Admitting: Internal Medicine

## 2023-08-11 ENCOUNTER — Other Ambulatory Visit: Payer: Self-pay

## 2023-08-11 MED ORDER — HYDROCODONE-ACETAMINOPHEN 10-325 MG PO TABS
1.0000 | ORAL_TABLET | Freq: Three times a day (TID) | ORAL | 0 refills | Status: DC | PRN
  Filled 2023-08-11 – 2023-08-12 (×2): qty 90, 30d supply, fill #0

## 2023-08-11 NOTE — Telephone Encounter (Signed)
Refilled: 06/17/2023 Last OV: 06/17/2023 Next OV: 09/19/2023

## 2023-08-12 ENCOUNTER — Other Ambulatory Visit: Payer: Self-pay

## 2023-08-15 ENCOUNTER — Other Ambulatory Visit: Payer: Self-pay

## 2023-08-15 ENCOUNTER — Ambulatory Visit: Payer: 59 | Admitting: Gastroenterology

## 2023-08-15 ENCOUNTER — Encounter: Payer: Self-pay | Admitting: Gastroenterology

## 2023-08-15 VITALS — BP 111/69 | HR 55 | Temp 97.6°F | Ht 70.0 in | Wt 211.1 lb

## 2023-08-15 DIAGNOSIS — D126 Benign neoplasm of colon, unspecified: Secondary | ICD-10-CM | POA: Diagnosis not present

## 2023-08-15 DIAGNOSIS — Z86004 Personal history of in-situ neoplasm of other and unspecified digestive organs: Secondary | ICD-10-CM | POA: Diagnosis not present

## 2023-08-15 DIAGNOSIS — Z09 Encounter for follow-up examination after completed treatment for conditions other than malignant neoplasm: Secondary | ICD-10-CM

## 2023-08-15 MED ORDER — NA SULFATE-K SULFATE-MG SULF 17.5-3.13-1.6 GM/177ML PO SOLN
354.0000 mL | Freq: Once | ORAL | 0 refills | Status: AC
  Filled 2023-08-15: qty 354, 1d supply, fill #0

## 2023-08-15 NOTE — Progress Notes (Unsigned)
Arlyss Repress, MD [address]  [address]  [place], [place] [address]  Main: [phone]  Fax: [phone]    Gastroenterology Consultation  Referring Provider:     Sherlene Shams, MD Primary Care Physician:  Sherlene Shams, MD Primary Gastroenterologist:  Dr. Arlyss Repress Reason for Consultation: To discuss about colonoscopy        HPI:   Tyrone Mcdowell is a 67 y.o. male referred by Dr. Sherlene Shams, MD  for consultation & management of high-grade rectal adenoma.  Patient underwent screening colonoscopy in 06/2022, found to have subcentimeter polyps as well as about 3 cm polyp in the proximal rectum.  He was on Plavix, therefore resection was not attempted.  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  Patient is here today thinking that he is undergoing colonoscopy and he was not sure why he was given an office visit.  He does not have any GI concerns today.  He denies any rectal bleeding, rectal pain, pressure, change in bowel habits or stool caliber.  Normal hemoglobin  NSAIDs: None  Antiplts/Anticoagulants/Anti thrombotics: Plavix  GI Procedures:  Colonoscopy 06/30/2022  - Two 3 to 5 mm polyps in the cecum, removed with a cold snare. Resected and retrieved. - Two 10 to 15 mm polyps in the descending colon, removed with a hot snare. Resected and retrieved. Clips were placed. - One 4 mm polyp in the proximal rectum, removed with a cold snare. Resected and retrieved. - One 20 mm polyp in the proximal rectum. Resection not attempted. - Non- bleeding external hemorrhoids. DIAGNOSIS:  A. COLON POLYP X 2, CECUM; COLD SNARE:  - TUBULAR ADENOMA (MULTIPLE FRAGMENTS).  - NEGATIVE FOR HIGH-GRADE DYSPLASIA AND MALIGNANCY.   B.  COLON POLYP X 2, DESCENDING; HOT SNARE:  - TUBULAR ADENOMA (2).  - CAUTERIZED INKED BASE IS  FREE OF DYSPLASIA.  - NEGATIVE FOR HIGH-GRADE DYSPLASIA AND MALIGNANCY.   C.  RECTUM POLYP; COLD SNARE:  - TUBULAR ADENOMA.  - NEGATIVE FOR HIGH-GRADE DYSPLASIA AND MALIGNANCY.    Flexible sigmoidoscopy 07/13/2022 - One 30 mm polyp in the proximal rectum, removed with mucosal resection. Resected and retrieved. Clips were placed. DIAGNOSIS:  A. RECTAL POLYP; MUCOSAL RESECTION:  - INTRAMUCOSAL CARCINOMA, ARISING WITHIN A TUBULAR ADENOMA.  - MARGINS OF RESECTION FREE OF DYSPLASIA.  - NEGATIVE FOR INVASIVE CARCINOMA.  Dysplasia is present 0.7 cm from the closest peripheral margin.   Past Medical History:  Diagnosis Date   Anxiety    takes Valium as needed   Arthritis    Chronic back pain    spondylolisthesis   Depression    takes Zoloft daily   Hoarseness or changing voice 05/12/2012   S/p ENT evaluation for throat CA.  Negative.      Hyperlipidemia    takes Atorvastatin daily   Hypertension    takes Micardis and AMlodipine daily   Joint pain    Joint swelling    Peripheral edema    takes Lasix daily   Urinary frequency    takes Rapaflo daily   Weakness    numbness and tingling    Past Surgical History:  Procedure Laterality Date   BACK SURGERY  04/2016   COLONOSCOPY     COLONOSCOPY WITH PROPOFOL N/A 06/30/2022   Procedure: COLONOSCOPY WITH PROPOFOL;  Surgeon: Toney Reil, MD;  Location: [facility] ENDOSCOPY;  Service: Gastroenterology;  Laterality: N/A;   FLEXIBLE SIGMOIDOSCOPY N/A 07/13/2022   Procedure: FLEXIBLE SIGMOIDOSCOPY;  Surgeon: Toney Reil, MD;  Location: [facility];  Service: Endoscopy;  Laterality: N/A;   TIBIA FRACTURE SURGERY Bilateral 1989   multiple surgeries    TIBIA HARDWARE REMOVAL  1989     Current Outpatient Medications:    albuterol (VENTOLIN HFA) 108 (90 Base) MCG/ACT inhaler, Inhale 2 puffs into the lungs every 6 (six) hours as needed for wheezing or shortness of breath., Disp: 8.5 g, Rfl: 11   ALPRAZolam (XANAX) 0.25 MG  tablet, Take 1 tablet (0.25 mg total) by mouth at bedtime as needed for anxiety., Disp: 30 tablet, Rfl: 2   amLODipine (NORVASC) 10 MG tablet, Take 1 tablet (10 mg total) by mouth daily., Disp: 90 tablet, Rfl: 1   atorvastatin (LIPITOR) 20 MG tablet, Take 1 tablet (20 mg total) by mouth daily at 6 PM., Disp: 90 tablet, Rfl: 1   celecoxib (CELEBREX) 200 MG capsule, Take 1 capsule (200 mg total) by mouth daily., Disp: 30 capsule, Rfl: 2   cloNIDine (CATAPRES) 0.1 MG tablet, Take 1 tablet (0.1 mg total) by mouth 3 (three) times daily. AS NEEDED FOR bp > 150/90, Disp: 90 tablet, Rfl: 3   clopidogrel (PLAVIX) 75 MG tablet, Take 1 tablet (75 mg total) by mouth daily., Disp: 90 tablet, Rfl: 1   DULoxetine (CYMBALTA) 30 MG capsule, Take 1 capsule (30 mg total) by mouth 2 (two) times daily., Disp: 180 capsule, Rfl: 1   furosemide (LASIX) 20 MG tablet, Take 1 tablet (20 mg total) by mouth daily as needed., Disp: 90 tablet, Rfl: 2   gabapentin (NEURONTIN) 300 MG capsule, Take 2 capsules (600 mg total) by mouth 3 (three) times daily., Disp: 180 capsule, Rfl: 2   HYDROcodone-acetaminophen (NORCO) 10-325 MG tablet, Take 1 tablet by mouth every 8 (eight) hours as needed., Disp: 90 tablet, Rfl: 0   montelukast (SINGULAIR) 10 MG tablet, Take 1 tablet (10 mg total) by mouth at bedtime., Disp: 90 tablet, Rfl: 3   Na Sulfate-K Sulfate-Mg Sulf 17.5-3.13-1.6 GM/177ML SOLN, Take 354 mLs by mouth once for 1 dose., Disp: 354 mL, Rfl: 0   pantoprazole (PROTONIX) 40 MG tablet, TAKE 1 TABLET BY MOUTH DAILY., Disp: 90 tablet, Rfl: 3   potassium chloride SA (KLOR-CON M) 20 MEQ tablet, Take 1 tablet (20 mEq total) by mouth daily and then one extra daily as needed, Disp: 20 tablet, Rfl: 0   tiZANidine (ZANAFLEX) 4 MG tablet, Take 1 tablet (4 mg total) by mouth 3 (three) times daily., Disp: 90 tablet, Rfl: 1   valsartan-hydrochlorothiazide (DIOVAN-HCT) 160-25 MG tablet, Take 1 tablet by mouth 2 (two) times daily., Disp: 180 tablet,  Rfl: 3   Family History  Problem Relation Age of Onset   Cancer Mother        lung Ca, tobacco abuse   CAD Mother        MI at age 87   Stroke Father    Hypertension Father    Heart disease Father        pacemaker, heart failure   Kidney disease Father    Ulcerative colitis Father    Cancer Sister 56       colon CA   Cancer Other        colon CA at age 97   Ulcerative colitis Brother      Social History   Tobacco Use   Smoking status: Every Day    Current  packs/day: 1.25    Average packs/day: 1.3 packs/day for 45.7 years (57.1 ttl pk-yrs)    Types: Cigarettes    Start date: 1979   Smokeless tobacco: Current    Types: Chew   Tobacco comments:    34 year pack history  Substance Use Topics   Alcohol use: No    Alcohol/week: 0.0 standard drinks of alcohol    Comment: no alcohol in over a yr   Drug use: No    Allergies as of 08/15/2023   (No Known Allergies)    Review of Systems:    All systems reviewed and negative except where noted in HPI.   Physical Exam:  BP 111/69 (BP Location: Right Arm, Patient Position: Sitting, Cuff Size: Large)   Pulse (!) 55   Temp 97.6 F (36.4 C) (Oral)   Ht 5\' 10"  (1.778 m)   Wt 211 lb 2 oz (95.8 kg)   BMI 30.29 kg/m  No LMP for male patient.  General:   Alert,  Well-developed, well-nourished, pleasant and cooperative in NAD Head:  Normocephalic and atraumatic. Eyes:  Sclera clear, no icterus.   Conjunctiva pink. Ears:  Normal auditory acuity. Nose:  No deformity, discharge, or lesions. Mouth:  No deformity or lesions,oropharynx pink & moist. Neck:  Supple; no masses or thyromegaly. Lungs:  Respirations even and unlabored.  Clear throughout to auscultation.   No wheezes, crackles, or rhonchi. No acute distress. Heart:  Regular rate and rhythm; no murmurs, clicks, rubs, or gallops. Abdomen:  Normal bowel sounds. Soft, non-tender and non-distended without masses, hepatosplenomegaly or hernias noted.  No guarding or rebound  tenderness.   Rectal: Not performed Msk:  Symmetrical without gross deformities. Good, equal movement & strength bilaterally. Pulses:  Normal pulses noted. Extremities:  No clubbing or edema.  No cyanosis. Neurologic:  Alert and oriented x3;  grossly normal neurologically. Skin:  Intact without significant lesions or rashes. No jaundice. Psych:  Alert and cooperative. Normal mood and affect.  Imaging Studies: None  Assessment and Plan:   Tyrone Mcdowell is a 67 y.o. pleasant male with history of hypertension, past history of stroke is seen in consultation to discuss about colonoscopy given his history of intramucosal adenocarcinoma within tubular adenoma in the proximal rectum status post EMR, margins free of dysplasia  Recommend to schedule surveillance colonoscopy obtaining clearance for interruption of Plavix for 5 days Check CEA levels   Follow up as needed   Arlyss Repress, MD

## 2023-08-16 ENCOUNTER — Telehealth: Payer: Self-pay

## 2023-08-16 LAB — CEA: CEA: 3.2 ng/mL (ref 0.0–4.7)

## 2023-08-16 NOTE — Telephone Encounter (Signed)
Called and left a message for call back  

## 2023-08-16 NOTE — Telephone Encounter (Signed)
-----   Message from [facility] sent at 08/16/2023 12:07 PM EDT ----- Normal levels of tumor marker tested for colon cancer I will see him for colonoscopy as scheduled  RV

## 2023-08-17 ENCOUNTER — Telehealth: Payer: Self-pay

## 2023-08-17 NOTE — Telephone Encounter (Signed)
Called and left a message for call back  

## 2023-08-17 NOTE — Telephone Encounter (Signed)
We received a medical clearance form from [facility] for patient via fax.  I sent a copy to Dr. Brennan Bailey [address].  I was just told that we have received multiple copies, and Sandy Salaam, CMA, already has a copy of this document.

## 2023-08-18 NOTE — Telephone Encounter (Signed)
Please advise if you recommend anything else to contact patient

## 2023-08-18 NOTE — Telephone Encounter (Signed)
Called and left a message for call back. Sent mychart message

## 2023-08-18 NOTE — Telephone Encounter (Signed)
Patient called back and verbalized understanding of results  

## 2023-08-19 ENCOUNTER — Other Ambulatory Visit: Payer: Self-pay | Admitting: Emergency Medicine

## 2023-08-19 ENCOUNTER — Encounter: Payer: Self-pay | Admitting: Internal Medicine

## 2023-08-19 DIAGNOSIS — Z7902 Long term (current) use of antithrombotics/antiplatelets: Secondary | ICD-10-CM | POA: Insufficient documentation

## 2023-08-21 ENCOUNTER — Other Ambulatory Visit: Payer: Self-pay

## 2023-08-22 ENCOUNTER — Other Ambulatory Visit: Payer: Self-pay | Admitting: Emergency Medicine

## 2023-08-22 ENCOUNTER — Other Ambulatory Visit: Payer: Self-pay

## 2023-08-23 ENCOUNTER — Other Ambulatory Visit: Payer: Self-pay | Admitting: Internal Medicine

## 2023-08-23 ENCOUNTER — Other Ambulatory Visit: Payer: Self-pay

## 2023-08-23 MED FILL — Potassium Chloride Microencapsulated Crys ER Tab 20 mEq: ORAL | 15 days supply | Qty: 20 | Fill #0 | Status: AC

## 2023-08-24 ENCOUNTER — Telehealth: Payer: Self-pay

## 2023-08-24 ENCOUNTER — Other Ambulatory Visit: Payer: Self-pay

## 2023-08-24 NOTE — Telephone Encounter (Signed)
Per Dr. Darrick Huntsman patient needs to Stop Plavix 7 days before procedure a restart it 1 day after procedure. Called patient and patient verbalized understanding of instructions

## 2023-08-26 ENCOUNTER — Other Ambulatory Visit: Payer: Self-pay | Admitting: Internal Medicine

## 2023-08-26 ENCOUNTER — Other Ambulatory Visit: Payer: Self-pay

## 2023-08-29 ENCOUNTER — Other Ambulatory Visit: Payer: Self-pay

## 2023-08-29 MED FILL — Celecoxib Cap 200 MG: ORAL | 30 days supply | Qty: 30 | Fill #0 | Status: AC

## 2023-08-30 ENCOUNTER — Other Ambulatory Visit: Payer: Self-pay

## 2023-08-31 ENCOUNTER — Other Ambulatory Visit: Payer: Self-pay

## 2023-09-01 ENCOUNTER — Other Ambulatory Visit: Payer: Self-pay | Admitting: Internal Medicine

## 2023-09-01 NOTE — Telephone Encounter (Signed)
Refilled: 04/26/2023 Last OV: 06/17/2023 Next OV: 09/19/2023

## 2023-09-02 ENCOUNTER — Other Ambulatory Visit: Payer: Self-pay

## 2023-09-02 ENCOUNTER — Other Ambulatory Visit: Payer: Self-pay | Admitting: Internal Medicine

## 2023-09-02 MED FILL — Alprazolam Tab 0.25 MG: ORAL | 30 days supply | Qty: 30 | Fill #0 | Status: AC

## 2023-09-06 ENCOUNTER — Other Ambulatory Visit: Payer: Self-pay

## 2023-09-11 ENCOUNTER — Other Ambulatory Visit: Payer: Self-pay | Admitting: Family

## 2023-09-11 ENCOUNTER — Other Ambulatory Visit: Payer: Self-pay

## 2023-09-13 ENCOUNTER — Other Ambulatory Visit: Payer: Self-pay

## 2023-09-13 ENCOUNTER — Other Ambulatory Visit: Payer: Self-pay | Admitting: Internal Medicine

## 2023-09-14 ENCOUNTER — Encounter: Payer: Self-pay | Admitting: Internal Medicine

## 2023-09-14 ENCOUNTER — Encounter: Payer: Self-pay | Admitting: Gastroenterology

## 2023-09-14 ENCOUNTER — Other Ambulatory Visit: Payer: Self-pay

## 2023-09-15 ENCOUNTER — Ambulatory Visit: Payer: 59 | Admitting: Anesthesiology

## 2023-09-15 ENCOUNTER — Other Ambulatory Visit: Payer: Self-pay

## 2023-09-15 ENCOUNTER — Telehealth: Payer: Self-pay

## 2023-09-15 ENCOUNTER — Ambulatory Visit
Admission: RE | Admit: 2023-09-15 | Discharge: 2023-09-15 | Disposition: A | Discharge status: Home / Self Care | Payer: 59 | Attending: Gastroenterology | Admitting: Gastroenterology

## 2023-09-15 ENCOUNTER — Encounter
Admission: RE | Disposition: A | Discharge status: Home / Self Care | Payer: Self-pay | Source: Home / Self Care | Attending: Gastroenterology

## 2023-09-15 ENCOUNTER — Encounter: Payer: Self-pay | Admitting: Gastroenterology

## 2023-09-15 DIAGNOSIS — F17210 Nicotine dependence, cigarettes, uncomplicated: Secondary | ICD-10-CM | POA: Diagnosis not present

## 2023-09-15 DIAGNOSIS — Z860101 Personal history of adenomatous and serrated colon polyps: Secondary | ICD-10-CM | POA: Insufficient documentation

## 2023-09-15 DIAGNOSIS — R350 Frequency of micturition: Secondary | ICD-10-CM | POA: Diagnosis not present

## 2023-09-15 DIAGNOSIS — Z09 Encounter for follow-up examination after completed treatment for conditions other than malignant neoplasm: Secondary | ICD-10-CM | POA: Insufficient documentation

## 2023-09-15 DIAGNOSIS — Z79899 Other long term (current) drug therapy: Secondary | ICD-10-CM | POA: Diagnosis not present

## 2023-09-15 DIAGNOSIS — J449 Chronic obstructive pulmonary disease, unspecified: Secondary | ICD-10-CM | POA: Diagnosis not present

## 2023-09-15 DIAGNOSIS — Z1211 Encounter for screening for malignant neoplasm of colon: Secondary | ICD-10-CM | POA: Insufficient documentation

## 2023-09-15 DIAGNOSIS — F32A Depression, unspecified: Secondary | ICD-10-CM | POA: Diagnosis not present

## 2023-09-15 DIAGNOSIS — D126 Benign neoplasm of colon, unspecified: Secondary | ICD-10-CM

## 2023-09-15 DIAGNOSIS — G8929 Other chronic pain: Secondary | ICD-10-CM | POA: Diagnosis not present

## 2023-09-15 DIAGNOSIS — F419 Anxiety disorder, unspecified: Secondary | ICD-10-CM | POA: Insufficient documentation

## 2023-09-15 DIAGNOSIS — I10 Essential (primary) hypertension: Secondary | ICD-10-CM | POA: Diagnosis not present

## 2023-09-15 DIAGNOSIS — E785 Hyperlipidemia, unspecified: Secondary | ICD-10-CM | POA: Insufficient documentation

## 2023-09-15 DIAGNOSIS — Z5986 Financial insecurity: Secondary | ICD-10-CM | POA: Diagnosis not present

## 2023-09-15 HISTORY — PX: COLONOSCOPY WITH PROPOFOL: SHX5780

## 2023-09-15 SURGERY — COLONOSCOPY WITH PROPOFOL
Anesthesia: General

## 2023-09-15 MED ORDER — PROPOFOL 10 MG/ML IV BOLUS
INTRAVENOUS | Status: DC | PRN
  Administered 2023-09-15: 100 mg via INTRAVENOUS

## 2023-09-15 MED ORDER — LIDOCAINE HCL (CARDIAC) PF 100 MG/5ML IV SOSY
PREFILLED_SYRINGE | INTRAVENOUS | Status: DC | PRN
  Administered 2023-09-15: 50 mg via INTRAVENOUS

## 2023-09-15 MED ORDER — STERILE WATER FOR IRRIGATION IR SOLN
Status: DC | PRN
  Administered 2023-09-15: 100 mL

## 2023-09-15 MED ORDER — PROPOFOL 500 MG/50ML IV EMUL
INTRAVENOUS | Status: DC | PRN
  Administered 2023-09-15: 125 ug/kg/min via INTRAVENOUS

## 2023-09-15 MED ORDER — SODIUM CHLORIDE 0.9 % IV SOLN: INTRAVENOUS | Status: DC

## 2023-09-15 MED ORDER — POTASSIUM CHLORIDE CRYS ER 20 MEQ PO TBCR
20.0000 meq | EXTENDED_RELEASE_TABLET | Freq: Every day | ORAL | 11 refills | Status: DC
  Filled 2023-09-15: qty 20, 20d supply, fill #0

## 2023-09-15 NOTE — H&P (Signed)
Tyrone Repress, MD [address]  [address]  [place], [place] [address]  Main: [phone]  Fax: [phone] Pager: [phone]  Primary Care Physician:  Sherlene Shams, MD Primary Gastroenterologist:  Dr. Arlyss Mcdowell  Pre-Procedure History & Physical: HPI:  Tyrone Mcdowell is a 67 y.o. male is here for an colonoscopy.   Past Medical History:  Diagnosis Date   Anxiety    takes Valium as needed   Arthritis    Chronic back pain    spondylolisthesis   Depression    takes Zoloft daily   Hoarseness or changing voice 05/12/2012   S/p ENT evaluation for throat CA.  Negative.      Hyperlipidemia    takes Atorvastatin daily   Hypertension    takes Micardis and AMlodipine daily   Joint pain    Joint swelling    Peripheral edema    takes Lasix daily   Urinary frequency    takes Rapaflo daily   Weakness    numbness and tingling    Past Surgical History:  Procedure Laterality Date   BACK SURGERY  04/2016   COLONOSCOPY     COLONOSCOPY WITH PROPOFOL N/A 06/30/2022   Procedure: COLONOSCOPY WITH PROPOFOL;  Surgeon: Toney Reil, MD;  Location: [facility] ENDOSCOPY;  Service: Gastroenterology;  Laterality: N/A;   FLEXIBLE SIGMOIDOSCOPY N/A 07/13/2022   Procedure: FLEXIBLE SIGMOIDOSCOPY;  Surgeon: Toney Reil, MD;  Location: [facility];  Service: Endoscopy;  Laterality: N/A;   TIBIA FRACTURE SURGERY Bilateral 1989   multiple surgeries    TIBIA HARDWARE REMOVAL  1989    Prior to Admission medications   Medication Sig Start Date End Date Taking? Authorizing Provider  amLODipine (NORVASC) 10 MG tablet Take 1 tablet (10 mg total) by mouth daily. 06/17/23  Yes Sherlene Shams, MD  atorvastatin (LIPITOR) 20 MG tablet Take 1 tablet (20 mg total) by mouth daily at 6 PM. 06/17/23 06/16/24 Yes Sherlene Shams, MD  celecoxib (CELEBREX) 200 MG capsule Take 1 capsule (200 mg total) by mouth daily. 08/29/23  Yes Sherlene Shams, MD  cloNIDine (CATAPRES) 0.1 MG tablet  Take 1 tablet (0.1 mg total) by mouth 3 (three) times daily. AS NEEDED FOR bp > 150/90 01/24/23  Yes Sherlene Shams, MD  DULoxetine (CYMBALTA) 30 MG capsule Take 1 capsule (30 mg total) by mouth 2 (two) times daily. 06/17/23 06/16/24 Yes Sherlene Shams, MD  furosemide (LASIX) 20 MG tablet Take 1 tablet (20 mg total) by mouth daily as needed. 06/30/18  Yes Gollan, Tollie Pizza, MD  gabapentin (NEURONTIN) 300 MG capsule Take 2 capsules (600 mg total) by mouth 3 (three) times daily. 07/26/23 07/25/24 Yes Sherlene Shams, MD  montelukast (SINGULAIR) 10 MG tablet Take 1 tablet (10 mg total) by mouth at bedtime. 03/18/23  Yes Sherlene Shams, MD  pantoprazole (PROTONIX) 40 MG tablet TAKE 1 TABLET BY MOUTH DAILY. 12/17/22 12/17/23 Yes Sherlene Shams, MD  potassium chloride SA (KLOR-CON M) 20 MEQ tablet Take 1 tablet (20 mEq total) by mouth daily and then one extra daily as needed 08/23/23  Yes Sherlene Shams, MD  valsartan-hydrochlorothiazide (DIOVAN-HCT) 160-25 MG tablet Take 1 tablet by mouth 2 (two) times daily. 05/24/23  Yes Sherlene Shams, MD  albuterol (VENTOLIN HFA) 108 (90 Base) MCG/ACT inhaler Inhale 2 puffs into the lungs every 6 (six) hours as needed for wheezing or shortness of breath. 02/16/22   Sherlene Shams, MD  ALPRAZolam (  XANAX) 0.25 MG tablet Take 1 tablet (0.25 mg total) by mouth at bedtime as needed for anxiety. 09/02/23   Sherlene Shams, MD  clopidogrel (PLAVIX) 75 MG tablet Take 1 tablet (75 mg total) by mouth daily. 06/17/23 06/16/24  Sherlene Shams, MD  HYDROcodone-acetaminophen (NORCO) 10-325 MG tablet Take 1 tablet by mouth every 8 (eight) hours as needed. 08/11/23   Eulis Foster, FNP  tiZANidine (ZANAFLEX) 4 MG tablet Take 1 tablet (4 mg total) by mouth 3 (three) times daily. 05/20/23   Sherlene Shams, MD    Allergies as of 08/16/2023   (No Known Allergies)    Family History  Problem Relation Age of Onset   Cancer Mother        lung Ca, tobacco abuse   CAD Mother        MI at age  20   Stroke Father    Hypertension Father    Heart disease Father        pacemaker, heart failure   Kidney disease Father    Ulcerative colitis Father    Cancer Sister 3       colon CA   Cancer Other        colon CA at age 54   Ulcerative colitis Brother     Social History   Socioeconomic History   Marital status: Married    Spouse name: Not on file   Number of children: Not on file   Years of education: Not on file   Highest education level: 12th grade  Occupational History   Not on file  Tobacco Use   Smoking status: Every Day    Current packs/day: 1.25    Average packs/day: 1.3 packs/day for 45.8 years (57.2 ttl pk-yrs)    Types: Cigarettes    Start date: 1979   Smokeless tobacco: Current    Types: Chew   Tobacco comments:    34 year pack history  Vaping Use   Vaping status: Never Used  Substance and Sexual Activity   Alcohol use: No    Alcohol/week: 0.0 standard drinks of alcohol    Comment: no alcohol in over a yr   Drug use: No   Sexual activity: Not Currently  Other Topics Concern   Not on file  Social History Narrative   Tyrone Mcdowell is married with 2 grown children and 2 grandchildren. He is on disability for back and leg pain.    Social Determinants of Health   Financial Resource Strain: Medium Risk (03/17/2023)   Overall Financial Resource Strain (CARDIA)    Difficulty of Paying Living Expenses: Somewhat hard  Food Insecurity: No Food Insecurity (03/17/2023)   Hunger Vital Sign    Worried About Running Out of Food in the Last Year: Never true    Ran Out of Food in the Last Year: Never true  Transportation Needs: No Transportation Needs (03/17/2023)   PRAPARE - Administrator, Civil Service (Medical): No    Lack of Transportation (Non-Medical): No  Physical Activity: Unknown (03/17/2023)   Exercise Vital Sign    Days of Exercise per Week: 0 days    Minutes of Exercise per Session: Not on file  Stress: Stress Concern Present (03/17/2023)    [employer] of Occupational Health - Occupational Stress Questionnaire    Feeling of Stress : Rather much  Social Connections: Unknown (03/17/2023)   Social Connection and Isolation Panel [NHANES]    Frequency of Communication with Friends and Family:  More than three times a week    Frequency of Social Gatherings with Friends and Family: Twice a week    Attends Religious Services: Patient declined    Database administrator or Organizations: Patient declined    Attends Engineer, structural: Not on file    Marital Status: Married  Catering manager Violence: Not on file    Review of Systems: See HPI, otherwise negative ROS  Physical Exam: BP (!) 140/87   Pulse 61   Resp 16   Ht 5\' 10"  (1.778 m)   Wt 93.4 kg   SpO2 100%   BMI 29.56 kg/m  General:   Alert,  pleasant and cooperative in NAD Head:  Normocephalic and atraumatic. Neck:  Supple; no masses or thyromegaly. Lungs:  Clear throughout to auscultation.    Heart:  Regular rate and rhythm. Abdomen:  Soft, nontender and nondistended. Normal bowel sounds, without guarding, and without rebound.   Neurologic:  Alert and  oriented x4;  grossly normal neurologically.  Impression/Plan: JAYVION STEFANSKI is here for an colonoscopy to be performed for h/o high risk adenoma  Risks, benefits, limitations, and alternatives regarding  colonoscopy have been reviewed with the patient.  Questions have been answered.  All parties agreeable.   Lannette Donath, MD  09/15/2023, 12:45 PM

## 2023-09-15 NOTE — Telephone Encounter (Signed)
Refilled: 08/23/2023 Last OV: 06/17/2023 Next OV: 09/19/2023  Last Potassium level: 06/17/2023 was 4.1

## 2023-09-15 NOTE — Transfer of Care (Signed)
Immediate Anesthesia Transfer of Care Note  Patient: Tyrone Mcdowell  Procedure(s) Performed: COLONOSCOPY WITH PROPOFOL  Patient Location: PACU  Anesthesia Type:General  Level of Consciousness: sedated and responds to stimulation  Airway & Oxygen Therapy: Patient Spontanous Breathing  Post-op Assessment: Report given to RN and Post -op Vital signs reviewed and stable  Post vital signs: Reviewed and stable  Last Vitals:  Vitals Value Taken Time  BP 134/74 09/15/23 1307  Temp 36.1 C 09/15/23 1306  Pulse 56 09/15/23 1308  Resp 18 09/15/23 1308  SpO2 99 % 09/15/23 1308  Vitals shown include unfiled device data.  Last Pain:  Vitals:   09/15/23 1306  TempSrc: Temporal  PainSc: Asleep         Complications: No notable events documented.

## 2023-09-15 NOTE — Anesthesia Preprocedure Evaluation (Signed)
Anesthesia Evaluation  Patient identified by MRN, date of birth, ID band Patient awake    Reviewed: Allergy & Precautions, H&P , NPO status , Patient's Chart, lab work & pertinent test results, reviewed documented beta blocker date and time   History of Anesthesia Complications Negative for: history of anesthetic complications  Airway Mallampati: II   Neck ROM: full    Dental  (+) Poor Dentition   Pulmonary neg shortness of breath, COPD, neg recent URI, Current Smoker and Patient abstained from smoking.   Pulmonary exam normal        Cardiovascular Exercise Tolerance: Good hypertension, On Medications (-) angina (-) Past MI and (-) Cardiac Stents negative cardio ROS Normal cardiovascular exam(-) dysrhythmias (-) Valvular Problems/Murmurs Rhythm:regular Rate:Normal     Neuro/Psych neg Seizures PSYCHIATRIC DISORDERS Anxiety Depression     Neuromuscular disease    GI/Hepatic negative GI ROS, Neg liver ROS,,,  Endo/Other  negative endocrine ROS    Renal/GU negative Renal ROS  negative genitourinary   Musculoskeletal   Abdominal   Peds  Hematology negative hematology ROS (+)   Anesthesia Other Findings Past Medical History: No date: Anxiety     Comment:  takes Valium as needed No date: Arthritis No date: Chronic back pain     Comment:  spondylolisthesis No date: Depression     Comment:  takes Zoloft daily No date: Hyperlipidemia     Comment:  takes Atorvastatin daily No date: Hypertension     Comment:  takes Micardis and AMlodipine daily No date: Joint pain No date: Joint swelling No date: Peripheral edema     Comment:  takes Lasix daily No date: Urinary frequency     Comment:  takes Rapaflo daily No date: Weakness     Comment:  numbness and tingling Past Surgical History: 04/2016: BACK SURGERY No date: COLONOSCOPY 06/30/2022: COLONOSCOPY WITH PROPOFOL; N/A     Comment:  Procedure: COLONOSCOPY WITH  PROPOFOL;  Surgeon: Toney Reil, MD;  Location: [facility] ENDOSCOPY;  Service:               Gastroenterology;  Laterality: N/A; 1989: TIBIA FRACTURE SURGERY; Bilateral     Comment:  multiple surgeries  1989: TIBIA HARDWARE REMOVAL BMI    Body Mass Index: 31.42 kg/m     Reproductive/Obstetrics negative OB ROS                             Anesthesia Physical Anesthesia Plan  ASA: 3  Anesthesia Plan: General   Post-op Pain Management:    Induction: Intravenous  PONV Risk Score and Plan: 1 and Propofol infusion and TIVA  Airway Management Planned: Natural Airway and Nasal Cannula  Additional Equipment:   Intra-op Plan:   Post-operative Plan:   Informed Consent: I have reviewed the patients History and Physical, chart, labs and discussed the procedure including the risks, benefits and alternatives for the proposed anesthesia with the patient or authorized representative who has indicated his/her understanding and acceptance.     Dental Advisory Given  Plan Discussed with: CRNA  Anesthesia Plan Comments:         Anesthesia Quick Evaluation

## 2023-09-15 NOTE — Telephone Encounter (Signed)
Per Tyrone Mcdowell in endo patient was not clean out today for his colonoscopy. He is going to come back tomorrow for a repeat colonoscopy. Patient is going to come buy and pick up a prep form our office. Gave him Sutab because that is the only prep we had in stock to give. Printed instructions for patient also

## 2023-09-15 NOTE — Op Note (Signed)
[facility] Gastroenterology Patient Name: Tyrone Mcdowell Procedure Date: 09/15/2023 12:44 PM MRN: [id number] Account #: [id number] Date of Birth: Feb 22, 1956 Admit Type: Outpatient Age: 67 Room: [facility] ENDO ROOM 3 Gender: Male Note Status: Finalized Instrument Name: Prentice Docker [id number] Procedure:             Colonoscopy Indications:           High risk colon cancer surveillance: Personal history                         of adenoma (10 mm or greater in size), High risk colon                         cancer surveillance: Personal history of adenoma with                         high grade dysplasia, Last colonoscopy: July 2023 Providers:             Toney Reil MD, MD Referring MD:          Duncan Dull, MD (Referring MD) Medicines:             General Anesthesia Complications:         No immediate complications. Estimated blood loss: None. Procedure:             Pre-Anesthesia Assessment:                        - Prior to the procedure, a History and Physical was                         performed, and patient medications and allergies were                         reviewed. The patient is competent. The risks and                         benefits of the procedure and the sedation options and                         risks were discussed with the patient. All questions                         were answered and informed consent was obtained.                         Patient identification and proposed procedure were                         verified by the physician, the nurse, the                         anesthesiologist, the anesthetist and the technician                         in the pre-procedure area in the procedure room in the                         endoscopy suite. Mental Status Examination: alert and  oriented. Airway Examination: normal oropharyngeal                         airway and neck mobility. Respiratory Examination:                          clear to auscultation. CV Examination: normal.                         Prophylactic Antibiotics: The patient does not require                         prophylactic antibiotics. Prior Anticoagulants: The                         patient has taken Plavix (clopidogrel), last dose was                         7 days prior to procedure. ASA Grade Assessment: III -                         A patient with severe systemic disease. After                         reviewing the risks and benefits, the patient was                         deemed in satisfactory condition to undergo the                         procedure. The anesthesia plan was to use general                         anesthesia. Immediately prior to administration of                         medications, the patient was re-assessed for adequacy                         to receive sedatives. The heart rate, respiratory                         rate, oxygen saturations, blood pressure, adequacy of                         pulmonary ventilation, and response to care were                         monitored throughout the procedure. The physical                         status of the patient was re-assessed after the                         procedure.                        After obtaining informed consent, the colonoscope was  passed under direct vision. Throughout the procedure,                         the patient's blood pressure, pulse, and oxygen                         saturations were monitored continuously. The                         Colonoscope was introduced through the anus and                         advanced to the the cecum, identified by appendiceal                         orifice and ileocecal valve. The colonoscopy was                         performed with moderate difficulty due to inadequate                         bowel prep. The patient tolerated the procedure well.                         The  quality of the bowel preparation was poor. The                         ileocecal valve, appendiceal orifice, and rectum were                         photographed. Findings:      The perianal and digital rectal examinations were normal. Pertinent       negatives include normal sphincter tone and no palpable rectal lesions.      Copious quantities of semi-liquid semi-solid stool was found in the       entire colon, precluding visualization.      The retroflexed view of the distal rectum and anal verge was normal and       showed no anal or rectal abnormalities. Impression:            - Preparation of the colon was poor.                        - Stool in the entire examined colon.                        - The distal rectum and anal verge are normal on                         retroflexion view.                        - No specimens collected. Recommendation:        - Discharge patient to home (with escort).                        - Clear liquid diet today.                        -  Continue present medications.                        - Repeat colonoscopy tomorrow with repeat prep or                         within 3 months with 2 day prep because the bowel                         preparation was suboptimal. Procedure Code(s):     --- Professional ---                        P3295, Colorectal cancer screening; colonoscopy on                         individual at high risk Diagnosis Code(s):     --- Professional ---                        Z86.010, Personal history of colonic polyps CPT copyright 2022 American Medical Association. All rights reserved. The codes documented in this report are preliminary and upon coder review may  be revised to meet current compliance requirements. Dr. Libby Maw Toney Reil MD, MD 09/15/2023 1:07:21 PM This report has been signed electronically. Number of Addenda: 0 Note Initiated On: 09/15/2023 12:44 PM Scope Withdrawal Time: 0 hours 4 minutes 55  seconds  Total Procedure Duration: 0 hours 7 minutes 34 seconds  Estimated Blood Loss:  Estimated blood loss: none.      [facility]

## 2023-09-16 ENCOUNTER — Encounter: Payer: Self-pay | Admitting: Gastroenterology

## 2023-09-16 ENCOUNTER — Ambulatory Visit: Payer: 59 | Admitting: Anesthesiology

## 2023-09-16 ENCOUNTER — Other Ambulatory Visit: Payer: Self-pay | Admitting: Internal Medicine

## 2023-09-16 ENCOUNTER — Other Ambulatory Visit: Payer: Self-pay

## 2023-09-16 ENCOUNTER — Ambulatory Visit
Admission: RE | Admit: 2023-09-16 | Discharge: 2023-09-16 | Disposition: A | Discharge status: Home / Self Care | Payer: 59 | Attending: Gastroenterology | Admitting: Gastroenterology

## 2023-09-16 ENCOUNTER — Encounter
Admission: RE | Disposition: A | Discharge status: Home / Self Care | Payer: Self-pay | Source: Home / Self Care | Attending: Gastroenterology

## 2023-09-16 DIAGNOSIS — K644 Residual hemorrhoidal skin tags: Secondary | ICD-10-CM | POA: Diagnosis not present

## 2023-09-16 DIAGNOSIS — Z09 Encounter for follow-up examination after completed treatment for conditions other than malignant neoplasm: Secondary | ICD-10-CM | POA: Diagnosis not present

## 2023-09-16 DIAGNOSIS — Z7902 Long term (current) use of antithrombotics/antiplatelets: Secondary | ICD-10-CM | POA: Diagnosis not present

## 2023-09-16 DIAGNOSIS — F17200 Nicotine dependence, unspecified, uncomplicated: Secondary | ICD-10-CM | POA: Diagnosis not present

## 2023-09-16 DIAGNOSIS — Z1211 Encounter for screening for malignant neoplasm of colon: Secondary | ICD-10-CM | POA: Diagnosis not present

## 2023-09-16 DIAGNOSIS — F17210 Nicotine dependence, cigarettes, uncomplicated: Secondary | ICD-10-CM | POA: Diagnosis not present

## 2023-09-16 DIAGNOSIS — Z860101 Personal history of adenomatous and serrated colon polyps: Secondary | ICD-10-CM | POA: Diagnosis not present

## 2023-09-16 DIAGNOSIS — K649 Unspecified hemorrhoids: Secondary | ICD-10-CM | POA: Diagnosis not present

## 2023-09-16 DIAGNOSIS — J449 Chronic obstructive pulmonary disease, unspecified: Secondary | ICD-10-CM | POA: Diagnosis not present

## 2023-09-16 DIAGNOSIS — I10 Essential (primary) hypertension: Secondary | ICD-10-CM | POA: Insufficient documentation

## 2023-09-16 DIAGNOSIS — Z91199 Patient's noncompliance with other medical treatment and regimen due to unspecified reason: Secondary | ICD-10-CM | POA: Diagnosis not present

## 2023-09-16 HISTORY — PX: COLONOSCOPY WITH PROPOFOL: SHX5780

## 2023-09-16 SURGERY — COLONOSCOPY WITH PROPOFOL
Anesthesia: General

## 2023-09-16 MED ORDER — PROPOFOL 1000 MG/100ML IV EMUL
INTRAVENOUS | Status: AC
  Filled 2023-09-16: qty 100

## 2023-09-16 MED ORDER — PROPOFOL 10 MG/ML IV BOLUS
INTRAVENOUS | Status: DC | PRN
Start: 2023-09-16 — End: 2023-09-16
  Administered 2023-09-16: 160 ug/kg/min via INTRAVENOUS
  Administered 2023-09-16: 80 mg via INTRAVENOUS

## 2023-09-16 MED ORDER — LIDOCAINE HCL (PF) 2 % IJ SOLN
INTRAMUSCULAR | Status: AC
  Filled 2023-09-16: qty 5

## 2023-09-16 MED ORDER — SODIUM CHLORIDE 0.9 % IV SOLN: INTRAVENOUS | Status: DC

## 2023-09-16 MED ORDER — LIDOCAINE HCL (PF) 2 % IJ SOLN
INTRAMUSCULAR | Status: DC | PRN
Start: 2023-09-16 — End: 2023-09-16
  Administered 2023-09-16: 50 mg via INTRADERMAL

## 2023-09-16 MED FILL — Hydrocodone-Acetaminophen Tab 10-325 MG: ORAL | 30 days supply | Qty: 90 | Fill #0 | Status: AC

## 2023-09-16 NOTE — Transfer of Care (Signed)
Immediate Anesthesia Transfer of Care Note  Patient: Tyrone Mcdowell  Procedure(s) Performed: COLONOSCOPY WITH PROPOFOL  Patient Location: Endoscopy Unit  Anesthesia Type:General  Level of Consciousness: sedated  Airway & Oxygen Therapy: Patient Spontanous Breathing  Post-op Assessment: Report given to RN and Post -op Vital signs reviewed and stable  Post vital signs: Reviewed and stable  Last Vitals:  Vitals Value Taken Time  BP 119/60 09/16/23 1205  Temp 35.8 C 09/16/23 1205  Pulse 62 09/16/23 1205  Resp 14 09/16/23 1205  SpO2 100 % 09/16/23 1205    Last Pain:  Vitals:   09/16/23 1205  TempSrc: Temporal  PainSc: Asleep         Complications: No notable events documented.

## 2023-09-16 NOTE — Anesthesia Postprocedure Evaluation (Signed)
Anesthesia Post Note  Patient: Tyrone Mcdowell  Procedure(s) Performed: COLONOSCOPY WITH PROPOFOL  Patient location during evaluation: PACU Anesthesia Type: General Level of consciousness: awake and awake and alert Pain management: satisfactory to patient Vital Signs Assessment: post-procedure vital signs reviewed and stable Respiratory status: spontaneous breathing and nonlabored ventilation Cardiovascular status: stable Anesthetic complications: no   No notable events documented.   Last Vitals:  Vitals:   09/16/23 1215 09/16/23 1225  BP: (!) 142/66 (!) 163/90  Pulse: 63 63  Resp: 14   Temp:    SpO2: 97% 98%    Last Pain:  Vitals:   09/16/23 1215  TempSrc:   PainSc: 0-No pain                 VAN STAVEREN,Kalin Amrhein

## 2023-09-16 NOTE — Op Note (Signed)
[facility] Gastroenterology Patient Name: Tyrone Mcdowell Procedure Date: 09/16/2023 11:35 AM MRN: [id number] Account #: [id number] Date of Birth: [birth year] Admit Type: Outpatient Age: 67 Room: [facility] ENDO ROOM 2 Gender: Male Note Status: Finalized Instrument Name: Prentice Docker [id number] Procedure:             Colonoscopy Indications:           Surveillance: Personal history of colonic polyps with                         unknown histology on last colonoscopy (inadequate                         bowel preparation) less than 3 years ago, High risk                         colon cancer surveillance: Personal history of adenoma                         with high grade dysplasia, Last colonoscopy: October                         2024 Providers:             Toney Reil MD, MD Medicines:             General Anesthesia Complications:         No immediate complications. Estimated blood loss: None. Procedure:             Pre-Anesthesia Assessment:                        - Prior to the procedure, a History and Physical was                         performed, and patient medications and allergies were                         reviewed. The patient is competent. The risks and                         benefits of the procedure and the sedation options and                         risks were discussed with the patient. All questions                         were answered and informed consent was obtained.                         Patient identification and proposed procedure were                         verified by the physician, the nurse, the                         anesthesiologist, the anesthetist and the technician                         in the pre-procedure area in  the procedure room in the                         endoscopy suite. Mental Status Examination: alert and                         oriented. Airway Examination: normal oropharyngeal                         airway and  neck mobility. Respiratory Examination:                         clear to auscultation. CV Examination: normal.                         Prophylactic Antibiotics: The patient does not require                         prophylactic antibiotics. Prior Anticoagulants: The                         patient has taken Plavix (clopidogrel), last dose was                         8 days prior to procedure. ASA Grade Assessment: III -                         A patient with severe systemic disease. After                         reviewing the risks and benefits, the patient was                         deemed in satisfactory condition to undergo the                         procedure. The anesthesia plan was to use general                         anesthesia. Immediately prior to administration of                         medications, the patient was re-assessed for adequacy                         to receive sedatives. The heart rate, respiratory                         rate, oxygen saturations, blood pressure, adequacy of                         pulmonary ventilation, and response to care were                         monitored throughout the procedure. The physical                         status of the patient was re-assessed after the  procedure.                        After obtaining informed consent, the colonoscope was                         passed under direct vision. Throughout the procedure,                         the patient's blood pressure, pulse, and oxygen                         saturations were monitored continuously. The                         Colonoscope was introduced through the anus and                         advanced to the the cecum, identified by appendiceal                         orifice and ileocecal valve. The colonoscopy was                         performed without difficulty. The patient tolerated                         the procedure well. The quality of  the bowel                         preparation was poor. The ileocecal valve, appendiceal                         orifice, and rectum were photographed. Findings:      The perianal and digital rectal examinations were normal. Pertinent       negatives include normal sphincter tone and no palpable rectal lesions.      Copious quantities of liquid stool was found in the entire colon,       precluding visualization. Lavage of the area was performed using 50 -       200 mL of sterile water, resulting in clearance with fair visualization.      Non-bleeding external hemorrhoids were found during retroflexion. The       hemorrhoids were medium-sized. Impression:            - Preparation of the colon was poor.                        - Stool in the entire examined colon.                        - Non-bleeding external hemorrhoids.                        - No specimens collected. Recommendation:        - Discharge patient to home (with escort).                        - Resume previous diet today.                        -  Continue present medications.                        - Resume Plavix (clopidogrel) today at prior dose.                         Refer to managing physician for further adjustment of                         therapy.                        - Repeat colonoscopy in 1 year with 2 days of large                         volume prep because the bowel preparation was                         suboptimal. Procedure Code(s):     --- Professional ---                        I6962, Colorectal cancer screening; colonoscopy on                         individual at high risk Diagnosis Code(s):     --- Professional ---                        K64.4, Residual hemorrhoidal skin tags                        Z86.010, Personal history of colonic polyps CPT copyright 2022 American Medical Association. All rights reserved. The codes documented in this report are preliminary and upon coder review may  be  revised to meet current compliance requirements. Dr. Libby Maw Toney Reil MD, MD 09/16/2023 12:12:20 PM This report has been signed electronically. Number of Addenda: 0 Note Initiated On: 09/16/2023 11:35 AM Estimated Blood Loss:  Estimated blood loss: none. Estimated blood loss: none.      [facility]

## 2023-09-16 NOTE — Anesthesia Preprocedure Evaluation (Signed)
Anesthesia Evaluation  Patient identified by MRN, date of birth, ID band Patient awake    Reviewed: Allergy & Precautions, NPO status , Patient's Chart, lab work & pertinent test results  Airway Mallampati: II  TM Distance: >3 FB Neck ROM: Full    Dental  (+) Edentulous Upper, Edentulous Lower   Pulmonary neg pulmonary ROS, COPD, Current Smoker and Patient abstained from smoking.   Pulmonary exam normal  + decreased breath sounds      Cardiovascular hypertension, Pt. on medications negative cardio ROS Normal cardiovascular exam Rhythm:Regular Rate:Normal     Neuro/Psych   Anxiety Depression    negative neurological ROS  negative psych ROS   GI/Hepatic negative GI ROS, Neg liver ROS,,,  Endo/Other  negative endocrine ROS    Renal/GU negative Renal ROS  negative genitourinary   Musculoskeletal   Abdominal  (+) + obese  Peds negative pediatric ROS (+)  Hematology negative hematology ROS (+)   Anesthesia Other Findings Past Medical History: No date: Anxiety     Comment:  takes Valium as needed No date: Arthritis No date: Chronic back pain     Comment:  spondylolisthesis No date: Depression     Comment:  takes Zoloft daily 05/12/2012: Hoarseness or changing voice     Comment:  S/p ENT evaluation for throat CA.  Negative.    No date: Hyperlipidemia     Comment:  takes Atorvastatin daily No date: Hypertension     Comment:  takes Micardis and AMlodipine daily No date: Joint pain No date: Joint swelling No date: Peripheral edema     Comment:  takes Lasix daily No date: Urinary frequency     Comment:  takes Rapaflo daily No date: Weakness     Comment:  numbness and tingling  Past Surgical History: 04/2016: BACK SURGERY No date: COLONOSCOPY 06/30/2022: COLONOSCOPY WITH PROPOFOL; N/A     Comment:  Procedure: COLONOSCOPY WITH PROPOFOL;  Surgeon: Toney Reil, MD;  Location: [facility] ENDOSCOPY;   Service:               Gastroenterology;  Laterality: N/A; 07/13/2022: FLEXIBLE SIGMOIDOSCOPY; N/A     Comment:  Procedure: FLEXIBLE SIGMOIDOSCOPY;  Surgeon: Toney Reil, MD;  Location: [facility];                Service: Endoscopy;  Laterality: N/A; 1989: TIBIA FRACTURE SURGERY; Bilateral     Comment:  multiple surgeries  1989: TIBIA HARDWARE REMOVAL  BMI    Body Mass Index: 28.81 kg/m      Reproductive/Obstetrics negative OB ROS                             Anesthesia Physical Anesthesia Plan  ASA: 3  Anesthesia Plan: General   Post-op Pain Management:    Induction: Intravenous  PONV Risk Score and Plan: Propofol infusion and TIVA  Airway Management Planned: Natural Airway  Additional Equipment:   Intra-op Plan:   Post-operative Plan:   Informed Consent: I have reviewed the patients History and Physical, chart, labs and discussed the procedure including the risks, benefits and alternatives for the proposed anesthesia with the patient or authorized representative who has indicated his/her understanding and acceptance.     Dental Advisory Given  Plan Discussed with: Anesthesiologist, CRNA and  Surgeon  Anesthesia Plan Comments:        Anesthesia Quick Evaluation

## 2023-09-16 NOTE — Anesthesia Procedure Notes (Signed)
Date/Time: 09/16/2023 11:47 AM  Performed by: Ginger Carne, CRNAPre-anesthesia Checklist: Patient identified, Emergency Drugs available, Suction available, Patient being monitored and Timeout performed Patient Re-evaluated:Patient Re-evaluated prior to induction Oxygen Delivery Method: Nasal cannula Preoxygenation: Pre-oxygenation with 100% oxygen Induction Type: IV induction

## 2023-09-16 NOTE — H&P (Signed)
Arlyss Repress, MD [address]  [address]  [place], [place] [address]  Main: [phone]  Fax: [phone] Pager: [phone]  Primary Care Physician:  Sherlene Shams, MD Primary Gastroenterologist:  Dr. Arlyss Repress  Pre-Procedure History & Physical: HPI:  Tyrone Mcdowell is a 67 y.o. male is here for an colonoscopy.   Past Medical History:  Diagnosis Date   Anxiety    takes Valium as needed   Arthritis    Chronic back pain    spondylolisthesis   Depression    takes Zoloft daily   Hoarseness or changing voice 05/12/2012   S/p ENT evaluation for throat CA.  Negative.      Hyperlipidemia    takes Atorvastatin daily   Hypertension    takes Micardis and AMlodipine daily   Joint pain    Joint swelling    Peripheral edema    takes Lasix daily   Urinary frequency    takes Rapaflo daily   Weakness    numbness and tingling    Past Surgical History:  Procedure Laterality Date   BACK SURGERY  04/2016   COLONOSCOPY     COLONOSCOPY WITH PROPOFOL N/A 06/30/2022   Procedure: COLONOSCOPY WITH PROPOFOL;  Surgeon: Toney Reil, MD;  Location: [facility] ENDOSCOPY;  Service: Gastroenterology;  Laterality: N/A;   FLEXIBLE SIGMOIDOSCOPY N/A 07/13/2022   Procedure: FLEXIBLE SIGMOIDOSCOPY;  Surgeon: Toney Reil, MD;  Location: [facility];  Service: Endoscopy;  Laterality: N/A;   TIBIA FRACTURE SURGERY Bilateral 1989   multiple surgeries    TIBIA HARDWARE REMOVAL  1989    Prior to Admission medications   Medication Sig Start Date End Date Taking? Authorizing Provider  albuterol (VENTOLIN HFA) 108 (90 Base) MCG/ACT inhaler Inhale 2 puffs into the lungs every 6 (six) hours as needed for wheezing or shortness of breath. 02/16/22  Yes Sherlene Shams, MD  ALPRAZolam Prudy Feeler) 0.25 MG tablet Take 1 tablet (0.25 mg total) by mouth at bedtime as needed for anxiety. 09/02/23  Yes Sherlene Shams, MD  amLODipine (NORVASC) 10 MG tablet Take 1 tablet (10 mg total) by  mouth daily. 06/17/23  Yes Sherlene Shams, MD  atorvastatin (LIPITOR) 20 MG tablet Take 1 tablet (20 mg total) by mouth daily at 6 PM. 06/17/23 06/16/24 Yes Sherlene Shams, MD  celecoxib (CELEBREX) 200 MG capsule Take 1 capsule (200 mg total) by mouth daily. 08/29/23  Yes Sherlene Shams, MD  cloNIDine (CATAPRES) 0.1 MG tablet Take 1 tablet (0.1 mg total) by mouth 3 (three) times daily. AS NEEDED FOR bp > 150/90 01/24/23  Yes Sherlene Shams, MD  furosemide (LASIX) 20 MG tablet Take 1 tablet (20 mg total) by mouth daily as needed. 06/30/18  Yes Gollan, Tollie Pizza, MD  gabapentin (NEURONTIN) 300 MG capsule Take 2 capsules (600 mg total) by mouth 3 (three) times daily. 07/26/23 07/25/24 Yes Sherlene Shams, MD  HYDROcodone-acetaminophen (NORCO) 10-325 MG tablet Take 1 tablet by mouth every 8 (eight) hours as needed. 08/11/23  Yes Worthy Rancher B, FNP  montelukast (SINGULAIR) 10 MG tablet Take 1 tablet (10 mg total) by mouth at bedtime. 03/18/23  Yes Sherlene Shams, MD  pantoprazole (PROTONIX) 40 MG tablet TAKE 1 TABLET BY MOUTH DAILY. 12/17/22 12/17/23 Yes Sherlene Shams, MD  valsartan-hydrochlorothiazide (DIOVAN-HCT) 160-25 MG tablet Take 1 tablet by mouth 2 (two) times daily. 05/24/23  Yes Sherlene Shams, MD  clopidogrel (PLAVIX) 75 MG tablet Take 1  tablet (75 mg total) by mouth daily. 06/17/23 06/16/24  Sherlene Shams, MD  DULoxetine (CYMBALTA) 30 MG capsule Take 1 capsule (30 mg total) by mouth 2 (two) times daily. 06/17/23 06/16/24  Sherlene Shams, MD  potassium chloride SA (KLOR-CON M) 20 MEQ tablet Take 1 tablet (20 mEq total) by mouth daily and then one extra daily as needed 09/15/23   Sherlene Shams, MD  tiZANidine (ZANAFLEX) 4 MG tablet Take 1 tablet (4 mg total) by mouth 3 (three) times daily. 05/20/23   Sherlene Shams, MD    Allergies as of 09/15/2023   (No Known Allergies)    Family History  Problem Relation Age of Onset   Cancer Mother        lung Ca, tobacco abuse   CAD Mother        MI at  age 39   Stroke Father    Hypertension Father    Heart disease Father        pacemaker, heart failure   Kidney disease Father    Ulcerative colitis Father    Cancer Sister 65       colon CA   Cancer Other        colon CA at age 35   Ulcerative colitis Brother     Social History   Socioeconomic History   Marital status: Married    Spouse name: Not on file   Number of children: Not on file   Years of education: Not on file   Highest education level: 12th grade  Occupational History   Not on file  Tobacco Use   Smoking status: Every Day    Current packs/day: 1.25    Average packs/day: 1.3 packs/day for 45.8 years (57.2 ttl pk-yrs)    Types: Cigarettes    Start date: 1979   Smokeless tobacco: Current    Types: Chew   Tobacco comments:    34 year pack history  Vaping Use   Vaping status: Never Used  Substance and Sexual Activity   Alcohol use: No    Alcohol/week: 0.0 standard drinks of alcohol    Comment: no alcohol in over a yr   Drug use: No   Sexual activity: Not Currently  Other Topics Concern   Not on file  Social History Narrative   Mr. Hall is married with 2 grown children and 2 grandchildren. He is on disability for back and leg pain.    Social Determinants of Health   Financial Resource Strain: Medium Risk (03/17/2023)   Overall Financial Resource Strain (CARDIA)    Difficulty of Paying Living Expenses: Somewhat hard  Food Insecurity: No Food Insecurity (03/17/2023)   Hunger Vital Sign    Worried About Running Out of Food in the Last Year: Never true    Ran Out of Food in the Last Year: Never true  Transportation Needs: No Transportation Needs (03/17/2023)   PRAPARE - Administrator, Civil Service (Medical): No    Lack of Transportation (Non-Medical): No  Physical Activity: Unknown (03/17/2023)   Exercise Vital Sign    Days of Exercise per Week: 0 days    Minutes of Exercise per Session: Not on file  Stress: Stress Concern Present  (03/17/2023)   [employer] of Occupational Health - Occupational Stress Questionnaire    Feeling of Stress : Rather much  Social Connections: Unknown (03/17/2023)   Social Connection and Isolation Panel [NHANES]    Frequency of Communication with Friends and Family:  More than three times a week    Frequency of Social Gatherings with Friends and Family: Twice a week    Attends Religious Services: Patient declined    Database administrator or Organizations: Patient declined    Attends Engineer, structural: Not on file    Marital Status: Married  Catering manager Violence: Not on file    Review of Systems: See HPI, otherwise negative ROS  Physical Exam: BP (!) 168/84   Pulse 60   Temp (!) 97.4 F (36.3 C) (Temporal)   Resp 18   Wt 91.1 kg   SpO2 100%   BMI 28.81 kg/m  General:   Alert,  pleasant and cooperative in NAD Head:  Normocephalic and atraumatic. Neck:  Supple; no masses or thyromegaly. Lungs:  Clear throughout to auscultation.    Heart:  Regular rate and rhythm. Abdomen:  Soft, nontender and nondistended. Normal bowel sounds, without guarding, and without rebound.   Neurologic:  Alert and  oriented x4;  grossly normal neurologically.  Impression/Plan: Tyrone Mcdowell is here for an colonoscopy to be performed for h/o colon adenoma  Risks, benefits, limitations, and alternatives regarding  colonoscopy have been reviewed with the patient.  Questions have been answered.  All parties agreeable.   Lannette Donath, MD  09/16/2023, 10:29 AM

## 2023-09-17 NOTE — Anesthesia Postprocedure Evaluation (Signed)
Anesthesia Post Note  Patient: Tyrone Mcdowell  Procedure(s) Performed: COLONOSCOPY WITH PROPOFOL  Patient location during evaluation: Endoscopy Anesthesia Type: General Level of consciousness: awake and alert Pain management: pain level controlled Vital Signs Assessment: post-procedure vital signs reviewed and stable Respiratory status: spontaneous breathing, nonlabored ventilation, respiratory function stable and patient connected to nasal cannula oxygen Cardiovascular status: blood pressure returned to baseline and stable Postop Assessment: no apparent nausea or vomiting Anesthetic complications: no   No notable events documented.   Last Vitals:  Vitals:   09/15/23 1318 09/15/23 1329  BP: (!) 159/89 (!) 175/93  Pulse: (!) 59 60  Resp: 11 10  Temp:    SpO2: 98% 98%    Last Pain:  Vitals:   09/17/23 1115  TempSrc:   PainSc: 0-No pain                 Lenard Simmer

## 2023-09-19 ENCOUNTER — Ambulatory Visit: Payer: 59 | Admitting: Internal Medicine

## 2023-09-19 ENCOUNTER — Encounter: Payer: Self-pay | Admitting: Internal Medicine

## 2023-09-19 ENCOUNTER — Other Ambulatory Visit: Payer: Self-pay

## 2023-09-19 VITALS — BP 150/70 | HR 60 | Ht 70.0 in | Wt 213.8 lb

## 2023-09-19 DIAGNOSIS — I10 Essential (primary) hypertension: Secondary | ICD-10-CM

## 2023-09-19 DIAGNOSIS — Z7902 Long term (current) use of antithrombotics/antiplatelets: Secondary | ICD-10-CM

## 2023-09-19 DIAGNOSIS — E785 Hyperlipidemia, unspecified: Secondary | ICD-10-CM

## 2023-09-19 DIAGNOSIS — Z720 Tobacco use: Secondary | ICD-10-CM | POA: Diagnosis not present

## 2023-09-19 DIAGNOSIS — I679 Cerebrovascular disease, unspecified: Secondary | ICD-10-CM

## 2023-09-19 DIAGNOSIS — F1120 Opioid dependence, uncomplicated: Secondary | ICD-10-CM | POA: Diagnosis not present

## 2023-09-19 DIAGNOSIS — Z23 Encounter for immunization: Secondary | ICD-10-CM | POA: Diagnosis not present

## 2023-09-19 DIAGNOSIS — S2222XD Fracture of body of sternum, subsequent encounter for fracture with routine healing: Secondary | ICD-10-CM

## 2023-09-19 DIAGNOSIS — Z122 Encounter for screening for malignant neoplasm of respiratory organs: Secondary | ICD-10-CM

## 2023-09-19 MED ORDER — HYDROCODONE-ACETAMINOPHEN 10-325 MG PO TABS
1.0000 | ORAL_TABLET | Freq: Two times a day (BID) | ORAL | 0 refills | Status: DC
  Filled 2023-09-19 – 2023-11-01 (×4): qty 60, 30d supply, fill #0

## 2023-09-19 MED ORDER — CLONIDINE HCL 0.1 MG PO TABS
0.1000 mg | ORAL_TABLET | Freq: Two times a day (BID) | ORAL | 3 refills | Status: DC
  Filled 2023-09-19: qty 180, 90d supply, fill #0
  Filled 2023-10-18 – 2024-02-15 (×2): qty 180, 90d supply, fill #1
  Filled 2024-06-09: qty 180, 90d supply, fill #2
  Filled 2024-09-13: qty 180, 90d supply, fill #3

## 2023-09-19 MED ORDER — CELECOXIB 200 MG PO CAPS
200.0000 mg | ORAL_CAPSULE | Freq: Two times a day (BID) | ORAL | 2 refills | Status: DC
  Filled 2023-09-19 (×2): qty 60, 30d supply, fill #0
  Filled 2023-10-18: qty 60, 30d supply, fill #1
  Filled 2023-11-16: qty 60, 30d supply, fill #2

## 2023-09-19 MED ORDER — NICOTINE 7 MG/24HR TD PT24
7.0000 mg | MEDICATED_PATCH | Freq: Every day | TRANSDERMAL | 0 refills | Status: DC
  Filled 2023-09-19: qty 28, 28d supply, fill #0

## 2023-09-19 NOTE — Assessment & Plan Note (Signed)
An MRI done in 2019 during ER workup for ataxia and dizziness noted  And  Old right basal ganglia infarct. Mild chronic small vessel schemic changes.[name]  He is complaint with statin therapy and clopidrogel.  CArotid and vertebral artery atherosclerosis was noted.  Coronary calcium socre was zero during July 2019 follow up with cardiology.  continueu statin and plavix

## 2023-09-19 NOTE — Assessment & Plan Note (Signed)
Encouataed to repeat his attepts to quit using nicotine patches.  He has reduced his use to 7 daily

## 2023-09-19 NOTE — Assessment & Plan Note (Signed)
Reduced vicodine to #2 daily   last refill was oct 11 for 90 per [facility].  Next refill will be for 62 and not before Nov 26

## 2023-09-19 NOTE — Assessment & Plan Note (Addendum)
Not at goal on maximal doses of amlodipine and losartan.  Adding  clonidine twice daily 0.1 mg  .  Advised to monitor BP at home

## 2023-09-19 NOTE — Assessment & Plan Note (Signed)
Plavix was held precolonoscopy last week .  CBC is pending   Lab Results  Component Value Date   WBC 6.4 05/08/2023   HGB 13.3 05/08/2023   HCT 40.4 05/08/2023   MCV 96.2 05/08/2023   PLT 250 05/08/2023

## 2023-09-19 NOTE — Progress Notes (Signed)
Subjective:  Patient ID: Tyrone Mcdowell, male    DOB: 1956-07-18  Age: 67 y.o. MRN: [id number]  CC: The primary encounter diagnosis was Primary hypertension. Diagnoses of Hyperlipidemia LDL goal <100, Chronic narcotic dependence (HCC), Need for influenza vaccination, Cerebrovascular disease, Closed fracture of body of sternum with routine healing, Long term (current) use of antithrombotics/antiplatelets, Screening for lung cancer, and Tobacco abuse were also pertinent to this visit.   HPI JAMIROQUAI CAVEY presents for  Chief Complaint  Patient presents with   Medical Management of Chronic Issues    3 month follow up    1) HTN:  TAKING AMLODIPINE,  VALSARTAN/ HCT AND CLONIDINE . Hoe readings have not been checked, so he has not  been using clonidine   2) HISTORY OF TRAUMATIC NON DISPLACED  UPPER STERNAL FRACTURE May 08 2023.    MUCH BETTER    3) COLONOSCOPY DONE OCT 10/11 NO POLYPS .  EXTERNAL HEMORRHOIDS NOTED  ASYMPTOMATIC.   4) CHRONIC LEG Janese Banks PAIN : USING HYDROCODONE 9 AM    3 PM  AND   8  PM ; celebrex 200 mg daily.  No extra tylenol     Outpatient Medications Prior to Visit  Medication Sig Dispense Refill   albuterol (VENTOLIN HFA) 108 (90 Base) MCG/ACT inhaler Inhale 2 puffs into the lungs every 6 (six) hours as needed for wheezing or shortness of breath. 8.5 g 11   ALPRAZolam (XANAX) 0.25 MG tablet Take 1 tablet (0.25 mg total) by mouth at bedtime as needed for anxiety. 30 tablet 2   amLODipine (NORVASC) 10 MG tablet Take 1 tablet (10 mg total) by mouth daily. 90 tablet 1   atorvastatin (LIPITOR) 20 MG tablet Take 1 tablet (20 mg total) by mouth daily at 6 PM. 90 tablet 1   clopidogrel (PLAVIX) 75 MG tablet Take 1 tablet (75 mg total) by mouth daily. 90 tablet 1   DULoxetine (CYMBALTA) 30 MG capsule Take 1 capsule (30 mg total) by mouth 2 (two) times daily. 180 capsule 1   furosemide (LASIX) 20 MG tablet Take 1 tablet (20 mg total) by mouth daily as needed. 90 tablet 2    gabapentin (NEURONTIN) 300 MG capsule Take 2 capsules (600 mg total) by mouth 3 (three) times daily. 180 capsule 2   montelukast (SINGULAIR) 10 MG tablet Take 1 tablet (10 mg total) by mouth at bedtime. 90 tablet 3   pantoprazole (PROTONIX) 40 MG tablet TAKE 1 TABLET BY MOUTH DAILY. 90 tablet 3   potassium chloride SA (KLOR-CON M) 20 MEQ tablet Take 1 tablet (20 mEq total) by mouth daily and then one extra daily as needed 20 tablet 11   tiZANidine (ZANAFLEX) 4 MG tablet Take 1 tablet (4 mg total) by mouth 3 (three) times daily. 90 tablet 1   valsartan-hydrochlorothiazide (DIOVAN-HCT) 160-25 MG tablet Take 1 tablet by mouth 2 (two) times daily. 180 tablet 3   celecoxib (CELEBREX) 200 MG capsule Take 1 capsule (200 mg total) by mouth daily. 30 capsule 2   cloNIDine (CATAPRES) 0.1 MG tablet Take 1 tablet (0.1 mg total) by mouth 3 (three) times daily. AS NEEDED FOR bp > 150/90 90 tablet 3   HYDROcodone-acetaminophen (NORCO) 10-325 MG tablet Take 1 tablet by mouth every 8 (eight) hours as needed. 90 tablet 0   No facility-administered medications prior to visit.    Review of Systems;  Patient denies headache, fevers, malaise, unintentional weight loss, skin rash, eye pain, sinus congestion and sinus  pain, sore throat, dysphagia,  hemoptysis , cough, dyspnea, wheezing, chest pain, palpitations, orthopnea, edema, abdominal pain, nausea, melena, diarrhea, constipation, flank pain, dysuria, hematuria, urinary  Frequency, nocturia, numbness, tingling, seizures,  Focal weakness, Loss of consciousness,  Tremor, insomnia, depression, anxiety, and suicidal ideation.      Objective:  BP (!) 150/70   Pulse 60   Ht 5\' 10"  (1.778 m)   Wt 213 lb 12.8 oz (97 kg)   SpO2 96%   BMI 30.68 kg/m   BP Readings from Last 3 Encounters:  09/19/23 (!) 150/70  09/16/23 (!) 163/90  09/15/23 (!) 175/93    Wt Readings from Last 3 Encounters:  09/19/23 213 lb 12.8 oz (97 kg)  09/16/23 200 lb 12.8 oz (91.1 kg)   09/15/23 206 lb (93.4 kg)    Physical Exam Vitals reviewed.  Constitutional:      General: He is not in acute distress.    Appearance: Normal appearance. He is normal weight. He is not ill-appearing, toxic-appearing or diaphoretic.  HENT:     Head: Normocephalic.  Eyes:     General: No scleral icterus.       Right eye: No discharge.        Left eye: No discharge.     Conjunctiva/sclera: Conjunctivae normal.  Cardiovascular:     Rate and Rhythm: Normal rate and regular rhythm.     Heart sounds: Normal heart sounds.  Pulmonary:     Effort: Pulmonary effort is normal. No respiratory distress.     Breath sounds: Normal breath sounds.  Musculoskeletal:        General: Normal range of motion.     Cervical back: Normal range of motion.  Skin:    General: Skin is warm and dry.  Neurological:     General: No focal deficit present.     Mental Status: He is alert and oriented to person, place, and time. Mental status is at baseline.  Psychiatric:        Mood and Affect: Mood normal.        Behavior: Behavior normal.        Thought Content: Thought content normal.        Judgment: Judgment normal.    Lab Results  Component Value Date   HGBA1C 5.3 01/17/2023   HGBA1C 5.6 01/04/2022   HGBA1C 5.6 06/12/2020    Lab Results  Component Value Date   CREATININE 0.82 06/17/2023   CREATININE 0.85 05/08/2023   CREATININE 0.79 01/17/2023    Lab Results  Component Value Date   WBC 6.4 05/08/2023   HGB 13.3 05/08/2023   HCT 40.4 05/08/2023   PLT 250 05/08/2023   GLUCOSE 92 06/17/2023   CHOL 163 01/17/2023   TRIG 67.0 01/17/2023   HDL 48.00 01/17/2023   LDLDIRECT 86.0 01/17/2023   LDLCALC 102 (H) 01/17/2023   ALT 15 05/08/2023   AST 20 05/08/2023   NA 138 06/17/2023   K 4.1 06/17/2023   CL 97 06/17/2023   CREATININE 0.82 06/17/2023   BUN 13 06/17/2023   CO2 35 (H) 06/17/2023   TSH 1.63 01/17/2023   PSA 0.15 01/17/2023   INR 1.07 05/08/2018   HGBA1C 5.3 01/17/2023    MICROALBUR <0.7 01/17/2023    No results found.  Assessment & Plan:  .Primary hypertension Assessment & Plan: Not at goal on maximal doses of amlodipine and losartan.  Adding  clonidine twice daily 0.1 mg  .  Advised to monitor BP at home  Orders: -  Comprehensive metabolic panel  Hyperlipidemia LDL goal <100 -     Lipid panel -     LDL cholesterol, direct -     Hemoglobin A1c -     CBC with Differential/Platelet -     TSH  Chronic narcotic dependence (HCC) Assessment & Plan: Reduced vicodine to #2 daily   last refill was oct 11 for 90 per [place] database.  Next refill will be for 60 and not before Nov 26    Need for influenza vaccination -     Flu vaccine trivalent PF, 6mos and older(Flulaval,Afluria,Fluarix,Fluzone)  Cerebrovascular disease Assessment & Plan: An MRI done in 2019 during ER workup for ataxia and dizziness noted  And  Old right basal ganglia infarct. Mild chronic small vessel schemic changes.[name]  He is complaint with statin therapy and clopidrogel.  CArotid and vertebral artery atherosclerosis was noted.  Coronary calcium socre was zero during July 2019 follow up with cardiology.  continueu statin and plavix    Closed fracture of body of sternum with routine healing Assessment & Plan: Healing without incident or need for increased analgesics.    Long term (current) use of antithrombotics/antiplatelets Assessment & Plan: Plavix was held precolonoscopy last week .  CBC is pending   Lab Results  Component Value Date   WBC 6.4 05/08/2023   HGB 13.3 05/08/2023   HCT 40.4 05/08/2023   MCV 96.2 05/08/2023   PLT 250 05/08/2023      Screening for lung cancer -     Ambulatory Referral for Lung Cancer Scre  Tobacco abuse Assessment & Plan: Encouataed to repeat his attepts to quit using nicotine patches.  He has reduced his use to 7 daily    Other orders -     cloNIDine HCl; Take 1 tablet (0.1 mg total) by mouth 2 (two) times daily.  Dispense: 180  tablet; Refill: 3 -     Celecoxib; Take 1 capsule (200 mg total) by mouth 2 (two) times daily.  Dispense: 60 capsule; Refill: 2 -     Nicotine; Place 1 patch (7 mg total) topically onto the skin once daily.  Dispense: 28 patch; Refill: 0 -     HYDROcodone-Acetaminophen; Take 1 tablet by mouth 2 (two) times daily for 60 doses.  Dispense: 60 tablet; Refill: 0     I provided 32 minutes of face-to-face time during this encounter reviewing patient's last visit with me, patient's  most recent visit with gastroenterology, ,  recent surgical and non surgical procedures, previous  labs and imaging studies, counseling on currently addressed issues,  and post visit ordering to diagnostics and therapeutics .   Follow-up: Return for chronic pain management, hypertension.   Sherlene Shams, MD

## 2023-09-19 NOTE — Assessment & Plan Note (Signed)
Healing without incident or need for increased analgesics.

## 2023-09-19 NOTE — Patient Instructions (Addendum)
BP IS TOO  HIGH  START TAKING CLONIDINE TWICE DAILY  WITH YOUR VALSARTAN AND CONTINUE AMLODIPINE AS WELL    WE ARE RESUMING YOUR WEANING OF HYDROCODONE  with a reduction to twice daily :  INCREASE CELEBREX TO EVERY 12 HOURS  REDUCE HYDROCODONE TO EVERY 12 HOURS . YOUR LAST PRESCRIPTION WAS REFILLED ON  OCT 11  FOR  90 TABLETS SO IT NEEDS TO LAST YOU 45 DAYS , NOT 30   ADD  AN ADDITIONAL   DOSE OF  650  MG TYLENOL  EVERY 12 HOURS     I AGREE WITH USING  THE NICOTINE PATCH  AND HAVE SENT IT TO [facility]

## 2023-09-20 ENCOUNTER — Encounter: Payer: Self-pay | Admitting: Internal Medicine

## 2023-09-20 ENCOUNTER — Other Ambulatory Visit: Payer: Self-pay

## 2023-09-20 LAB — CBC WITH DIFFERENTIAL/PLATELET
Basophils Absolute: 0 10*3/uL (ref 0.0–0.1)
Basophils Relative: 0.7 % (ref 0.0–3.0)
Eosinophils Absolute: 0.2 10*3/uL (ref 0.0–0.7)
Eosinophils Relative: 4.1 % (ref 0.0–5.0)
HCT: 38 % — ABNORMAL LOW (ref 39.0–52.0)
Hemoglobin: 12.8 g/dL — ABNORMAL LOW (ref 13.0–17.0)
Lymphocytes Relative: 32.3 % (ref 12.0–46.0)
Lymphs Abs: 1.7 10*3/uL (ref 0.7–4.0)
MCHC: 33.6 g/dL (ref 30.0–36.0)
MCV: 96.4 fL (ref 78.0–100.0)
Monocytes Absolute: 0.6 10*3/uL (ref 0.1–1.0)
Monocytes Relative: 11.2 % (ref 3.0–12.0)
Neutro Abs: 2.6 10*3/uL (ref 1.4–7.7)
Neutrophils Relative %: 51.7 % (ref 43.0–77.0)
Platelets: 245 10*3/uL (ref 150.0–400.0)
RBC: 3.94 Mil/uL — ABNORMAL LOW (ref 4.22–5.81)
RDW: 13.8 % (ref 11.5–15.5)
WBC: 5.1 10*3/uL (ref 4.0–10.5)

## 2023-09-20 LAB — COMPREHENSIVE METABOLIC PANEL
ALT: 21 U/L (ref 0–53)
AST: 39 U/L — ABNORMAL HIGH (ref 0–37)
Albumin: 3.7 g/dL (ref 3.5–5.2)
Alkaline Phosphatase: 82 U/L (ref 39–117)
BUN: 7 mg/dL (ref 6–23)
CO2: 33 meq/L — ABNORMAL HIGH (ref 19–32)
Calcium: 8.8 mg/dL (ref 8.4–10.5)
Chloride: 97 meq/L (ref 96–112)
Creatinine, Ser: 0.83 mg/dL (ref 0.40–1.50)
GFR: 92.18 mL/min (ref >60.00)
Glucose, Bld: 87 mg/dL (ref 70–99)
Potassium: 3.3 meq/L — ABNORMAL LOW (ref 3.5–5.1)
Sodium: 138 meq/L (ref 135–145)
Total Bilirubin: 0.4 mg/dL (ref 0.2–1.2)
Total Protein: 5.9 g/dL — ABNORMAL LOW (ref 6.0–8.3)

## 2023-09-20 LAB — LIPID PANEL
Cholesterol: 142 mg/dL (ref 0–200)
HDL: 53.1 mg/dL (ref >39.00)
LDL Cholesterol: 71 mg/dL (ref 0–99)
NonHDL: 89.21
Total CHOL/HDL Ratio: 3
Triglycerides: 89 mg/dL (ref 0.0–149.0)
VLDL: 17.8 mg/dL (ref 0.0–40.0)

## 2023-09-20 LAB — LDL CHOLESTEROL, DIRECT: Direct LDL: 66 mg/dL

## 2023-09-20 LAB — HEMOGLOBIN A1C: Hgb A1c MFr Bld: 5.2 % (ref 4.6–6.5)

## 2023-09-20 LAB — TSH: TSH: 1.79 u[IU]/mL (ref 0.35–5.50)

## 2023-09-21 ENCOUNTER — Telehealth: Payer: Self-pay

## 2023-09-21 NOTE — Telephone Encounter (Signed)
-----   Message from Sherlene Shams sent at 09/20/2023  8:52 PM EDT ----- Your potassium is slightly low again. Please confirm if you are taking a potassium supplement currently,  and whether you are using furosemide daily or "as needed"

## 2023-09-21 NOTE — Telephone Encounter (Signed)
LmTB in regards to lab results.

## 2023-09-22 ENCOUNTER — Other Ambulatory Visit: Payer: Self-pay

## 2023-09-23 ENCOUNTER — Encounter: Payer: Self-pay | Admitting: Internal Medicine

## 2023-09-23 ENCOUNTER — Telehealth: Payer: Self-pay | Admitting: Internal Medicine

## 2023-09-23 MED ORDER — POTASSIUM CHLORIDE CRYS ER 20 MEQ PO TBCR
20.0000 meq | EXTENDED_RELEASE_TABLET | Freq: Every day | ORAL | 2 refills | Status: DC
  Filled 2023-09-23 – 2024-01-17 (×2): qty 40, 40d supply, fill #0
  Filled 2024-03-30: qty 40, 40d supply, fill #1
  Filled 2024-05-07: qty 40, 40d supply, fill #2

## 2023-09-23 NOTE — Telephone Encounter (Signed)
09/23/23 - Pt called stating he is returning a call from the CMA per lab results and wants a call back at [phone]

## 2023-09-23 NOTE — Telephone Encounter (Signed)
See previous message

## 2023-09-23 NOTE — Telephone Encounter (Signed)
Patients wife states patient is taking a daily potassium supplement and is using the furosemide as needed.

## 2023-09-23 NOTE — Addendum Note (Signed)
Addended by: Sherlene Shams on: 09/23/2023 06:06 PM   Modules accepted: Orders

## 2023-09-24 ENCOUNTER — Other Ambulatory Visit: Payer: Self-pay

## 2023-09-25 ENCOUNTER — Other Ambulatory Visit: Payer: Self-pay

## 2023-09-27 NOTE — Telephone Encounter (Signed)
Spoke with pt

## 2023-09-27 NOTE — Telephone Encounter (Signed)
Patient states he is returning our call.  I read Kristie Cowman, CMA's message to patient and transferred call to her.

## 2023-10-18 ENCOUNTER — Other Ambulatory Visit: Payer: Self-pay

## 2023-10-19 ENCOUNTER — Other Ambulatory Visit: Payer: Self-pay

## 2023-10-19 NOTE — Telephone Encounter (Signed)
Error

## 2023-10-20 ENCOUNTER — Other Ambulatory Visit: Payer: Self-pay | Admitting: Internal Medicine

## 2023-10-20 ENCOUNTER — Other Ambulatory Visit: Payer: Self-pay

## 2023-10-20 ENCOUNTER — Telehealth: Payer: Self-pay | Admitting: Internal Medicine

## 2023-10-20 MED FILL — Alprazolam Tab 0.25 MG: ORAL | 30 days supply | Qty: 30 | Fill #1 | Status: AC

## 2023-10-20 NOTE — Telephone Encounter (Signed)
Spoke with pt and he stated that he went to get his hydrocodone refilled today but they told him that it could not be filled until 11/26. Pt stated that his last fill was on 09/19/2023. Pt stated that the rx was reduced to 2 tablets daily on 09/19/2023 and he did not reduce the dose until the following week. Pt is out of medication.

## 2023-10-20 NOTE — Telephone Encounter (Signed)
Spoke with pt's wife and informed her of the message. She stated that she will let pt know.

## 2023-10-20 NOTE — Telephone Encounter (Signed)
Pt would like to be called regarding his hydrodone

## 2023-10-21 ENCOUNTER — Other Ambulatory Visit: Payer: Self-pay

## 2023-10-21 DIAGNOSIS — F17210 Nicotine dependence, cigarettes, uncomplicated: Secondary | ICD-10-CM

## 2023-10-21 DIAGNOSIS — Z122 Encounter for screening for malignant neoplasm of respiratory organs: Secondary | ICD-10-CM

## 2023-10-21 DIAGNOSIS — Z87891 Personal history of nicotine dependence: Secondary | ICD-10-CM

## 2023-10-21 MED FILL — Gabapentin Cap 300 MG: ORAL | 30 days supply | Qty: 180 | Fill #0 | Status: CN

## 2023-10-24 ENCOUNTER — Encounter: Payer: Self-pay | Admitting: Adult Health

## 2023-10-24 ENCOUNTER — Ambulatory Visit (INDEPENDENT_AMBULATORY_CARE_PROVIDER_SITE_OTHER): Payer: 59 | Admitting: Adult Health

## 2023-10-24 DIAGNOSIS — F17210 Nicotine dependence, cigarettes, uncomplicated: Secondary | ICD-10-CM | POA: Diagnosis not present

## 2023-10-24 NOTE — Progress Notes (Signed)
  Virtual Visit via Telephone Note  I connected with Tyrone Mcdowell , 10/24/23 10:36 AM by a telemedicine application and verified that I am speaking with the correct person using two identifiers.  Location: Patient: home Provider: home   I discussed the limitations of evaluation and management by telemedicine and the availability of in person appointments. The patient expressed understanding and agreed to proceed.   Shared Decision Making Visit Lung Cancer Screening Program [phone])   Eligibility: 67 y.o. Pack Years Smoking History Calculation = 29 pack years (# packs/per year x # years smoked) Recent History of coughing up blood  no Unexplained weight loss? no ( >Than 15 pounds within the last 6 months ) Prior History Lung / other cancer no (Diagnosis within the last 5 years already requiring surveillance chest CT Scans). Smoking Status Current Smoker  Visit Components: Discussion included one or more decision making aids. YES Discussion included risk/benefits of screening. YES Discussion included potential follow up diagnostic testing for abnormal scans. YES Discussion included meaning and risk of over diagnosis. YES Discussion included meaning and risk of False Positives. YES Discussion included meaning of total radiation exposure. YES  Counseling Included: Importance of adherence to annual lung cancer LDCT screening. YES Impact of comorbidities on ability to participate in the program. YES Ability and willingness to under diagnostic treatment. YES  Smoking Cessation Counseling: Current Smokers:  Discussed importance of smoking cessation. yes Information about tobacco cessation classes and interventions provided to patient. yes Patient provided with "ticket" for LDCT Scan. yes Symptomatic Patient. no Diagnosis Code: Tobacco Use Z72.0 Asymptomatic Patient yes  Counseling (Intermediate counseling: > three minutes counseling) [id number] Written Order for Lung Cancer  Screening with LDCT placed in Epic. Yes (CT Chest Lung Cancer Screening Low Dose W/O CM) [id number]  Z12.2-Screening of respiratory organs Z87.891-Personal history of nicotine dependence   Danford Bad 10/24/23

## 2023-10-24 NOTE — Patient Instructions (Signed)

## 2023-10-25 ENCOUNTER — Other Ambulatory Visit: Payer: Self-pay

## 2023-10-25 MED FILL — Gabapentin Cap 300 MG: ORAL | 30 days supply | Qty: 180 | Fill #0 | Status: AC

## 2023-10-28 ENCOUNTER — Ambulatory Visit: Admission: RE | Admit: 2023-10-28 | Payer: 59 | Source: Ambulatory Visit

## 2023-11-01 ENCOUNTER — Other Ambulatory Visit: Payer: Self-pay

## 2023-11-16 ENCOUNTER — Other Ambulatory Visit: Payer: Self-pay

## 2023-11-16 MED FILL — Gabapentin Cap 300 MG: ORAL | 30 days supply | Qty: 180 | Fill #1 | Status: AC

## 2023-11-25 ENCOUNTER — Other Ambulatory Visit: Payer: Self-pay

## 2023-11-28 ENCOUNTER — Other Ambulatory Visit: Payer: Self-pay | Admitting: Internal Medicine

## 2023-11-28 ENCOUNTER — Other Ambulatory Visit: Payer: Self-pay

## 2023-11-28 MED FILL — Hydrocodone-Acetaminophen Tab 10-325 MG: ORAL | 30 days supply | Qty: 60 | Fill #0 | Status: CN

## 2023-11-29 ENCOUNTER — Other Ambulatory Visit: Payer: Self-pay

## 2023-11-29 MED FILL — Hydrocodone-Acetaminophen Tab 10-325 MG: ORAL | 30 days supply | Qty: 60 | Fill #0 | Status: AC

## 2023-12-12 ENCOUNTER — Other Ambulatory Visit: Payer: Self-pay

## 2023-12-18 ENCOUNTER — Other Ambulatory Visit: Payer: Self-pay | Admitting: Internal Medicine

## 2023-12-19 ENCOUNTER — Other Ambulatory Visit: Payer: Self-pay

## 2023-12-19 MED ORDER — CELECOXIB 200 MG PO CAPS
200.0000 mg | ORAL_CAPSULE | Freq: Two times a day (BID) | ORAL | 2 refills | Status: DC
  Filled 2023-12-19: qty 60, 30d supply, fill #0
  Filled 2024-01-17: qty 60, 30d supply, fill #1
  Filled 2024-02-15: qty 60, 30d supply, fill #2

## 2023-12-19 MED ORDER — DULOXETINE HCL 30 MG PO CPEP
30.0000 mg | ORAL_CAPSULE | Freq: Two times a day (BID) | ORAL | 1 refills | Status: DC
  Filled 2023-12-19: qty 180, 90d supply, fill #0
  Filled 2024-02-29: qty 180, 90d supply, fill #1

## 2023-12-20 ENCOUNTER — Ambulatory Visit: Payer: 59 | Admitting: Internal Medicine

## 2023-12-20 MED FILL — Gabapentin Cap 300 MG: ORAL | 30 days supply | Qty: 180 | Fill #2 | Status: AC

## 2023-12-21 ENCOUNTER — Encounter: Payer: Self-pay | Admitting: Acute Care

## 2023-12-21 ENCOUNTER — Ambulatory Visit (INDEPENDENT_AMBULATORY_CARE_PROVIDER_SITE_OTHER): Payer: HMO | Admitting: Internal Medicine

## 2023-12-21 ENCOUNTER — Other Ambulatory Visit: Payer: Self-pay

## 2023-12-21 ENCOUNTER — Encounter: Payer: Self-pay | Admitting: Internal Medicine

## 2023-12-21 VITALS — BP 120/68 | HR 60 | Temp 98.1°F | Ht 70.0 in | Wt 200.0 lb

## 2023-12-21 DIAGNOSIS — Z23 Encounter for immunization: Secondary | ICD-10-CM

## 2023-12-21 DIAGNOSIS — M79605 Pain in left leg: Secondary | ICD-10-CM

## 2023-12-21 DIAGNOSIS — Z87898 Personal history of other specified conditions: Secondary | ICD-10-CM

## 2023-12-21 DIAGNOSIS — K760 Fatty (change of) liver, not elsewhere classified: Secondary | ICD-10-CM | POA: Diagnosis not present

## 2023-12-21 DIAGNOSIS — J449 Chronic obstructive pulmonary disease, unspecified: Secondary | ICD-10-CM | POA: Diagnosis not present

## 2023-12-21 DIAGNOSIS — Z7902 Long term (current) use of antithrombotics/antiplatelets: Secondary | ICD-10-CM | POA: Diagnosis not present

## 2023-12-21 DIAGNOSIS — I10 Essential (primary) hypertension: Secondary | ICD-10-CM | POA: Diagnosis not present

## 2023-12-21 DIAGNOSIS — F411 Generalized anxiety disorder: Secondary | ICD-10-CM | POA: Diagnosis not present

## 2023-12-21 DIAGNOSIS — F1120 Opioid dependence, uncomplicated: Secondary | ICD-10-CM | POA: Diagnosis not present

## 2023-12-21 DIAGNOSIS — G8929 Other chronic pain: Secondary | ICD-10-CM | POA: Diagnosis not present

## 2023-12-21 DIAGNOSIS — M79604 Pain in right leg: Secondary | ICD-10-CM | POA: Diagnosis not present

## 2023-12-21 DIAGNOSIS — E876 Hypokalemia: Secondary | ICD-10-CM

## 2023-12-21 DIAGNOSIS — E785 Hyperlipidemia, unspecified: Secondary | ICD-10-CM

## 2023-12-21 DIAGNOSIS — D485 Neoplasm of uncertain behavior of skin: Secondary | ICD-10-CM

## 2023-12-21 LAB — COMPREHENSIVE METABOLIC PANEL
ALT: 40 U/L (ref 0–53)
AST: 46 U/L — ABNORMAL HIGH (ref 0–37)
Albumin: 4.1 g/dL (ref 3.5–5.2)
Alkaline Phosphatase: 124 U/L — ABNORMAL HIGH (ref 39–117)
BUN: 17 mg/dL (ref 6–23)
CO2: 37 meq/L — ABNORMAL HIGH (ref 19–32)
Calcium: 8.7 mg/dL (ref 8.4–10.5)
Chloride: 97 meq/L (ref 96–112)
Creatinine, Ser: 0.91 mg/dL (ref 0.40–1.50)
GFR: 88.61 mL/min (ref >60.00)
Glucose, Bld: 103 mg/dL — ABNORMAL HIGH (ref 70–99)
Potassium: 3.9 meq/L (ref 3.5–5.1)
Sodium: 139 meq/L (ref 135–145)
Total Bilirubin: 0.5 mg/dL (ref 0.2–1.2)
Total Protein: 6.4 g/dL (ref 6.0–8.3)

## 2023-12-21 LAB — MICROALBUMIN / CREATININE URINE RATIO
Creatinine,U: 69 mg/dL
Microalb Creat Ratio: 6.5 mg/g (ref 0.0–30.0)
Microalb, Ur: 4.5 mg/dL — ABNORMAL HIGH (ref 0.0–1.9)

## 2023-12-21 LAB — LIPID PANEL
Cholesterol: 165 mg/dL (ref 0–200)
HDL: 62.5 mg/dL (ref >39.00)
LDL Cholesterol: 77 mg/dL (ref 0–99)
NonHDL: 102.26
Total CHOL/HDL Ratio: 3
Triglycerides: 124 mg/dL (ref 0.0–149.0)
VLDL: 24.8 mg/dL (ref 0.0–40.0)

## 2023-12-21 MED ORDER — VALSARTAN-HYDROCHLOROTHIAZIDE 160-25 MG PO TABS
1.0000 | ORAL_TABLET | Freq: Two times a day (BID) | ORAL | 3 refills | Status: DC
  Filled 2023-12-21 – 2024-02-19 (×2): qty 180, 90d supply, fill #0
  Filled 2024-05-18: qty 180, 90d supply, fill #1
  Filled 2024-08-16: qty 180, 90d supply, fill #2
  Filled 2024-11-18: qty 180, 90d supply, fill #3

## 2023-12-21 MED ORDER — ATORVASTATIN CALCIUM 20 MG PO TABS
20.0000 mg | ORAL_TABLET | Freq: Every day | ORAL | 1 refills | Status: DC
  Filled 2023-12-21 – 2024-01-17 (×2): qty 90, 90d supply, fill #0
  Filled 2024-04-16: qty 90, 90d supply, fill #1

## 2023-12-21 MED ORDER — AMLODIPINE BESYLATE 10 MG PO TABS
10.0000 mg | ORAL_TABLET | Freq: Every day | ORAL | 1 refills | Status: DC
  Filled 2023-12-21 – 2024-01-17 (×2): qty 90, 90d supply, fill #0
  Filled 2024-04-14: qty 90, 90d supply, fill #1

## 2023-12-21 MED ORDER — MONTELUKAST SODIUM 10 MG PO TABS
10.0000 mg | ORAL_TABLET | Freq: Every day | ORAL | 3 refills | Status: DC
  Filled 2023-12-21 – 2024-03-13 (×2): qty 90, 90d supply, fill #0
  Filled 2024-07-02: qty 90, 90d supply, fill #1
  Filled 2024-10-06: qty 90, 90d supply, fill #2

## 2023-12-21 MED ORDER — TIZANIDINE HCL 4 MG PO TABS
4.0000 mg | ORAL_TABLET | Freq: Three times a day (TID) | ORAL | 1 refills | Status: DC
  Filled 2023-12-21: qty 90, 30d supply, fill #0
  Filled 2024-01-17: qty 90, 30d supply, fill #1

## 2023-12-21 MED ORDER — GABAPENTIN 300 MG PO CAPS
600.0000 mg | ORAL_CAPSULE | Freq: Three times a day (TID) | ORAL | 2 refills | Status: DC
  Filled 2023-12-21 – 2024-01-17 (×2): qty 180, 30d supply, fill #0
  Filled 2024-02-15: qty 180, 30d supply, fill #1
  Filled 2024-03-14: qty 180, 30d supply, fill #2

## 2023-12-21 MED ORDER — CLOPIDOGREL BISULFATE 75 MG PO TABS
75.0000 mg | ORAL_TABLET | Freq: Every day | ORAL | 1 refills | Status: DC
  Filled 2023-12-21 – 2024-01-17 (×2): qty 90, 90d supply, fill #0
  Filled 2024-04-16: qty 90, 90d supply, fill #1

## 2023-12-21 MED ORDER — PANTOPRAZOLE SODIUM 40 MG PO TBEC
40.0000 mg | DELAYED_RELEASE_TABLET | Freq: Every day | ORAL | 3 refills | Status: DC
  Filled 2023-12-21: qty 90, 90d supply, fill #0
  Filled 2024-03-30: qty 90, 90d supply, fill #1
  Filled 2024-07-02: qty 90, 90d supply, fill #2
  Filled 2024-10-06: qty 90, 90d supply, fill #3

## 2023-12-21 NOTE — Assessment & Plan Note (Signed)
 He is currently asymptomatic  Without scheduled bronchodilators or ICS.  He continues to smoke 1/2 pack daily and is aware of the risk

## 2023-12-21 NOTE — Patient Instructions (Addendum)
 I will not increase the hydrocodone  again because we are WEANING you from narcotics.  But I will not decrease it until the weather is warmer.  In the meantime, please Increase the duloxetine  to 90 mg daily as a TRIAL   (30  mg three times daily ) to help with pain management  Continue celebrex  and gabapentin  at current doses   If the duloxetine  increased  dose  does not help or is not tolerated,  Let me know ad I will send in tramadol  to use in between doses of vicodin    I WOULD ALSO Consider adding a TENS unit to help manage pain. YOU CAN BUY ONE ON [employer]  REFERRAL TO [facility] IN PROCESS   REFERRAL TO LUNG NODULE CLINIC TO RESUME CT SCANS ANNUALLY IS ALSO IN PROGRESS

## 2023-12-21 NOTE — Progress Notes (Signed)
 Subjective:  Patient ID: Tyrone Mcdowell, male    DOB: [birth year]  Age: 68 y.o. MRN: [id number]  CC: The primary encounter diagnosis was Chronic narcotic dependence (HCC). Diagnoses of Chronic pain of both lower extremities, H/O solitary pulmonary nodule, Fatty liver, Long term (current) use of antithrombotics/antiplatelets, Hypokalemia, Primary hypertension, Hyperlipidemia LDL goal <100, Neoplasm of uncertain behavior of skin, Anxiety, generalized, Need for pneumococcal 20-valent conjugate vaccination, and Chronic obstructive pulmonary disease, unspecified COPD type (HCC) were also pertinent to this visit.   HPI [name] presents for  Chief Complaint  Patient presents with  . Medical Management of Chronic Issues   1)  chronic back and leg pain:  his pain has been  managed with narcotics since 2022.  Aaron Aas Since Weaning of hydrocodone  from tid to bid which began with November 26 refill,   he states that his entire body is in pain and rates his pain currently at 8/10; the pain is  aggravated by cold weather.  Walking 15 to 30 minutes daily depending on the weather .  Taking tylenol  in between the hydrocodone   celebrex  200 mg bid,  gabapentin  600 mg tid, and cymbalta  30 mg bid.    He has had no recent trauma or falls, and it is noteworthy that he did not require additional medications for pain management last summer when he fractured his sternum  2) tobacco :  smoking 10 / daily which is a reduction for him..  not currently enrolled in lung CA screening.  Last CT chest 2019 4 mm subpleural nodule noted favored to be scarring  3) Hypertension: patient checks blood pressure twice weekly at home.  Readings have been for the most part <130/80 at rest . Patient is following a reduced salt diet most days and is taking medications as prescribed (amlodipine  10 mg ,  clonidine  0.1 mg bid and valsartan /hct.  4) VEnous insufficiency:  using furosemide  prn,  walking helps reduce his ankle edema.   5)  hypokalemia ;  taking 20 meq daily    Outpatient Medications Prior to Visit  Medication Sig Dispense Refill  . albuterol  (VENTOLIN  HFA) 108 (90 Base) MCG/ACT inhaler Inhale 2 puffs into the lungs every 6 (six) hours as needed for wheezing or shortness of breath. 8.5 g 11  . ALPRAZolam  (XANAX ) 0.25 MG tablet Take 1 tablet (0.25 mg total) by mouth at bedtime as needed for anxiety. 30 tablet 2  . celecoxib  (CELEBREX ) 200 MG capsule Take 1 capsule (200 mg total) by mouth 2 (two) times daily. 60 capsule 2  . cloNIDine  (CATAPRES ) 0.1 MG tablet Take 1 tablet (0.1 mg total) by mouth 2 (two) times daily. 180 tablet 3  . DULoxetine  (CYMBALTA ) 30 MG capsule Take 1 capsule (30 mg total) by mouth 2 (two) times daily. 180 capsule 1  . furosemide  (LASIX ) 20 MG tablet Take 1 tablet (20 mg total) by mouth daily as needed. 90 tablet 2  . HYDROcodone -acetaminophen  (NORCO) 10-325 MG tablet Take 1 tablet by mouth 2 (two) times daily for 60 doses. 60 tablet 0  . nicotine  (NICODERM CQ  - DOSED IN MG/24 HR) 7 mg/24hr patch Place 1 patch (7 mg total) topically onto the skin once daily. 28 patch 0  . potassium chloride  SA (KLOR-CON  M) 20 MEQ tablet Take 1 tablet (20 mEq total) by mouth daily. 2 on days with furosemide  40 tablet 2  . amLODipine  (NORVASC ) 10 MG tablet Take 1 tablet (10 mg total) by mouth daily. 90 tablet 1  .  atorvastatin  (LIPITOR) 20 MG tablet Take 1 tablet (20 mg total) by mouth daily at 6 PM. 90 tablet 1  . clopidogrel  (PLAVIX ) 75 MG tablet Take 1 tablet (75 mg total) by mouth daily. 90 tablet 1  . gabapentin  (NEURONTIN ) 300 MG capsule Take 2 capsules (600 mg total) by mouth 3 (three) times daily. 180 capsule 2  . montelukast  (SINGULAIR ) 10 MG tablet Take 1 tablet (10 mg total) by mouth at bedtime. 90 tablet 3  . pantoprazole  (PROTONIX ) 40 MG tablet TAKE 1 TABLET BY MOUTH DAILY. 90 tablet 3  . tiZANidine  (ZANAFLEX ) 4 MG tablet Take 1 tablet (4 mg total) by mouth 3 (three) times daily. 90 tablet 1  .  valsartan -hydrochlorothiazide  (DIOVAN -HCT) 160-25 MG tablet Take 1 tablet by mouth 2 (two) times daily. 180 tablet 3   No facility-administered medications prior to visit.    Review of Systems;  Patient denies headache, fevers, malaise, unintentional weight loss, skin rash, eye pain, sinus congestion and sinus pain, sore throat, dysphagia,  hemoptysis , cough, dyspnea, wheezing, chest pain, palpitations, orthopnea, edema, abdominal pain, nausea, melena, diarrhea, constipation, flank pain, dysuria, hematuria, urinary  Frequency, nocturia, numbness, tingling, seizures,  Focal weakness, Loss of consciousness,  Tremor, insomnia, depression, anxiety, and suicidal ideation.      Objective:  BP 120/68   Pulse 60   Temp 98.1 F (36.7 C) (Oral)   Ht 5\' 10"  (1.778 m)   Wt 200 lb (90.7 kg)   SpO2 99%   BMI 28.70 kg/m   BP Readings from Last 3 Encounters:  12/21/23 120/68  09/19/23 (!) 150/70  09/16/23 (!) 163/90    Wt Readings from Last 3 Encounters:  12/21/23 200 lb (90.7 kg)  09/19/23 213 lb 12.8 oz (97 kg)  09/16/23 200 lb 12.8 oz (91.1 kg)    Physical Exam Vitals reviewed.  Constitutional:      General: He is not in acute distress.    Appearance: Normal appearance. He is normal weight. He is not ill-appearing, toxic-appearing or diaphoretic.  HENT:     Head: Normocephalic.  Eyes:     General: No scleral icterus.       Right eye: No discharge.        Left eye: No discharge.     Conjunctiva/sclera: Conjunctivae normal.  Cardiovascular:     Rate and Rhythm: Normal rate and regular rhythm.     Heart sounds: Normal heart sounds.  Pulmonary:     Effort: Pulmonary effort is normal. No respiratory distress.     Breath sounds: Normal breath sounds.  Musculoskeletal:        General: Normal range of motion.     Cervical back: Normal range of motion.  Skin:    General: Skin is warm and dry.  Neurological:     General: No focal deficit present.     Mental Status: He is alert  and oriented to person, place, and time. Mental status is at baseline.  Psychiatric:        Mood and Affect: Mood normal.        Behavior: Behavior normal.        Thought Content: Thought content normal.        Judgment: Judgment normal.   Lab Results  Component Value Date   HGBA1C 5.2 09/19/2023   HGBA1C 5.3 01/17/2023   HGBA1C 5.6 01/04/2022    Lab Results  Component Value Date   CREATININE 0.83 09/19/2023   CREATININE 0.82 06/17/2023   CREATININE 0.85  05/08/2023    Lab Results  Component Value Date   WBC 5.1 09/19/2023   HGB 12.8 (L) 09/19/2023   HCT 38.0 (L) 09/19/2023   PLT 245.0 09/19/2023   GLUCOSE 87 09/19/2023   CHOL 142 09/19/2023   TRIG 89.0 09/19/2023   HDL 53.10 09/19/2023   LDLDIRECT 66.0 09/19/2023   LDLCALC 71 09/19/2023   ALT 21 09/19/2023   AST 39 (H) 09/19/2023   NA 138 09/19/2023   K 3.3 (L) 09/19/2023   CL 97 09/19/2023   CREATININE 0.83 09/19/2023   BUN 7 09/19/2023   CO2 33 (H) 09/19/2023   TSH 1.79 09/19/2023   PSA 0.15 01/17/2023   INR 1.07 05/08/2018   HGBA1C 5.2 09/19/2023   MICROALBUR <0.7 01/17/2023    No results found.  Assessment & Plan:  .Chronic narcotic dependence (HCC) Assessment & Plan: Hydrocodone  was reduced to 2 daily in November ,  last refill Dec 24 byTonya Walsh.  Still taking gabapentin  and celebrex .  Will not reduce hydrocodone  until warmer weather as we may need to reduce celebrex     Chronic pain of both lower extremities Assessment & Plan: Secondary to remote trauma, now with narcotic dependence.    H/O solitary pulmonary nodule -     AMB  Referral to [facility]  Fatty liver Assessment & Plan: AST was 39 last visit , may be due to chronic NSAID use.  If still elevated will need to reduce celebrex  to 200 mg daily (or 100 mg bid)   Long term (current) use of antithrombotics/antiplatelets  Hypokalemia  Primary hypertension -     Comprehensive metabolic panel -     Microalbumin /  creatinine urine ratio  Hyperlipidemia LDL goal <100 -     Lipid panel  Neoplasm of uncertain behavior of skin -     Ambulatory referral to Dermatology  Anxiety, generalized Assessment & Plan: He is taking cymbalta  for pain management but may be benefitting in other ways from the medication He is using  alprazolam   less than once daily to avoid conflict at home turning into shouting matches.   Discussed a trial of 90 mg daily in divided doses to help manage pain . Refills given . The risks and benefits of benzodiazepine use were reviewed with patient today including excessive sedation leading to respiratory depression,  impaired thinking/driving, and addiction.  Patient was advised to avoid concurrent use with alcohol, to use medication only as needed and not to share with others  .    Need for pneumococcal 20-valent conjugate vaccination -     Pneumococcal conjugate vaccine 20-valent  Chronic obstructive pulmonary disease, unspecified COPD type (HCC) Assessment & Plan: He is currently asymptomatic  Without scheduled bronchodilators or ICS.  He continues to smoke 1/2 pack daily and is aware of the risk    Other orders -     amLODIPine  Besylate; Take 1 tablet (10 mg total) by mouth daily.  Dispense: 90 tablet; Refill: 1 -     Atorvastatin  Calcium ; Take 1 tablet (20 mg total) by mouth daily at 6 PM.  Dispense: 90 tablet; Refill: 1 -     Clopidogrel  Bisulfate; Take 1 tablet (75 mg total) by mouth daily.  Dispense: 90 tablet; Refill: 1 -     Gabapentin ; Take 2 capsules (600 mg total) by mouth 3 (three) times daily.  Dispense: 180 capsule; Refill: 2 -     Montelukast  Sodium; Take 1 tablet (10 mg total) by mouth at bedtime.  Dispense: 90 tablet; Refill: 3 -     Pantoprazole  Sodium; Take 1 tablet (40 mg total) by mouth daily.  Dispense: 90 tablet; Refill: 3 -     tiZANidine  HCl; Take 1 tablet (4 mg total) by mouth 3 (three) times daily.  Dispense: 90 tablet; Refill: 1 -      Valsartan -hydroCHLOROthiazide ; Take 1 tablet by mouth 2 (two) times daily.  Dispense: 180 tablet; Refill: 3     I provided 30 minutes of face-to-face time during this encounter reviewing patient's last visit with me, patient's  most recent visit with cardiology,  nephrology,  and neurology,  recent surgical and non surgical procedures, previous  labs and imaging studies, counseling on currently addressed issues,  and post visit ordering to diagnostics and therapeutics .   Follow-up: No follow-ups on file.   Thersia Flax, MD

## 2023-12-21 NOTE — Assessment & Plan Note (Signed)
 AST was 39 last visit , may be due to chronic NSAID use.  If still elevated will need to reduce celebrex  to 200 mg daily (or 100 mg bid)

## 2023-12-21 NOTE — Assessment & Plan Note (Signed)
 Secondary to remote trauma, now with narcotic dependence.

## 2023-12-21 NOTE — Assessment & Plan Note (Signed)
 He is taking cymbalta  for pain management but may be benefitting in other ways from the medication He is using  alprazolam   less than once daily to avoid conflict at home turning into shouting matches.   Discussed a trial of 90 mg daily in divided doses to help manage pain . Refills given . The risks and benefits of benzodiazepine use were reviewed with patient today including excessive sedation leading to respiratory depression,  impaired thinking/driving, and addiction.  Patient was advised to avoid concurrent use with alcohol, to use medication only as needed and not to share with others  .

## 2023-12-21 NOTE — Assessment & Plan Note (Addendum)
 Hydrocodone  was reduced to 2 daily in November ,  last refill Dec 24 byTonya Walsh.  Still taking gabapentin  and celebrex .  Will not reduce hydrocodone  until warmer weather as we may need to reduce celebrex 

## 2023-12-23 NOTE — Addendum Note (Signed)
Addended by: Sherlene Shams on: 12/23/2023 06:32 AM   Modules accepted: Orders

## 2023-12-30 ENCOUNTER — Other Ambulatory Visit: Payer: Self-pay

## 2023-12-30 ENCOUNTER — Other Ambulatory Visit: Payer: Self-pay | Admitting: Internal Medicine

## 2023-12-30 MED ORDER — HYDROCODONE-ACETAMINOPHEN 10-325 MG PO TABS
1.0000 | ORAL_TABLET | Freq: Two times a day (BID) | ORAL | 0 refills | Status: DC
  Filled 2023-12-30: qty 60, 30d supply, fill #0

## 2024-01-17 ENCOUNTER — Other Ambulatory Visit: Payer: Self-pay

## 2024-01-17 MED FILL — Alprazolam Tab 0.25 MG: ORAL | 30 days supply | Qty: 30 | Fill #2 | Status: AC

## 2024-01-27 ENCOUNTER — Other Ambulatory Visit: Payer: Self-pay | Admitting: Internal Medicine

## 2024-01-27 ENCOUNTER — Other Ambulatory Visit: Payer: Self-pay

## 2024-01-29 ENCOUNTER — Other Ambulatory Visit: Payer: Self-pay

## 2024-01-30 ENCOUNTER — Other Ambulatory Visit: Payer: Self-pay

## 2024-01-30 MED FILL — Hydrocodone-Acetaminophen Tab 10-325 MG: ORAL | 30 days supply | Qty: 60 | Fill #0 | Status: AC

## 2024-02-06 ENCOUNTER — Telehealth: Payer: Self-pay | Admitting: Internal Medicine

## 2024-02-06 NOTE — Telephone Encounter (Signed)
 Copied from CRM [id number]. Topic: Medicare AWV >> Feb 06, 2024  2:18 PM Payton Doughty wrote: Reason for CRM: Called LVM 02/06/2024 to schedule AWV. Please schedule office or virtual visits.  Verlee Rossetti; Care Guide Ambulatory Clinical Support [place] [facility] Direct Dial: [phone]

## 2024-02-19 ENCOUNTER — Other Ambulatory Visit: Payer: Self-pay

## 2024-02-27 ENCOUNTER — Other Ambulatory Visit: Payer: Self-pay | Admitting: Internal Medicine

## 2024-02-27 MED ORDER — HYDROCODONE-ACETAMINOPHEN 10-325 MG PO TABS
1.0000 | ORAL_TABLET | Freq: Two times a day (BID) | ORAL | 0 refills | Status: DC
  Filled 2024-02-27: qty 60, 30d supply, fill #0

## 2024-02-28 ENCOUNTER — Other Ambulatory Visit: Payer: Self-pay

## 2024-03-01 ENCOUNTER — Other Ambulatory Visit: Payer: Self-pay | Admitting: Internal Medicine

## 2024-03-01 ENCOUNTER — Other Ambulatory Visit: Payer: Self-pay

## 2024-03-01 MED FILL — Alprazolam Tab 0.25 MG: ORAL | 30 days supply | Qty: 30 | Fill #0 | Status: AC

## 2024-03-07 ENCOUNTER — Telehealth: Payer: Self-pay | Admitting: Internal Medicine

## 2024-03-12 NOTE — Telephone Encounter (Signed)
 My Chart message sent

## 2024-03-13 ENCOUNTER — Other Ambulatory Visit: Payer: Self-pay

## 2024-03-15 ENCOUNTER — Other Ambulatory Visit: Payer: Self-pay

## 2024-03-15 ENCOUNTER — Ambulatory Visit: Admitting: Internal Medicine

## 2024-03-15 ENCOUNTER — Encounter: Payer: Self-pay | Admitting: Internal Medicine

## 2024-03-15 VITALS — BP 106/64 | HR 64 | Ht 70.0 in | Wt 189.0 lb

## 2024-03-15 DIAGNOSIS — Z0000 Encounter for general adult medical examination without abnormal findings: Secondary | ICD-10-CM | POA: Diagnosis not present

## 2024-03-15 DIAGNOSIS — Z1211 Encounter for screening for malignant neoplasm of colon: Secondary | ICD-10-CM

## 2024-03-15 DIAGNOSIS — M5417 Radiculopathy, lumbosacral region: Secondary | ICD-10-CM

## 2024-03-15 DIAGNOSIS — F1120 Opioid dependence, uncomplicated: Secondary | ICD-10-CM

## 2024-03-15 DIAGNOSIS — K760 Fatty (change of) liver, not elsewhere classified: Secondary | ICD-10-CM | POA: Diagnosis not present

## 2024-03-15 DIAGNOSIS — J438 Other emphysema: Secondary | ICD-10-CM

## 2024-03-15 DIAGNOSIS — I10 Essential (primary) hypertension: Secondary | ICD-10-CM

## 2024-03-15 LAB — COMPREHENSIVE METABOLIC PANEL WITH GFR
ALT: 26 U/L (ref 0–53)
AST: 26 U/L (ref 0–37)
Albumin: 4.2 g/dL (ref 3.5–5.2)
Alkaline Phosphatase: 148 U/L — ABNORMAL HIGH (ref 39–117)
BUN: 16 mg/dL (ref 6–23)
CO2: 34 meq/L — ABNORMAL HIGH (ref 19–32)
Calcium: 9.2 mg/dL (ref 8.4–10.5)
Chloride: 98 meq/L (ref 96–112)
Creatinine, Ser: 0.76 mg/dL (ref 0.40–1.50)
GFR: 94.35 mL/min (ref >60.00)
Glucose, Bld: 110 mg/dL — ABNORMAL HIGH (ref 70–99)
Potassium: 3.7 meq/L (ref 3.5–5.1)
Sodium: 139 meq/L (ref 135–145)
Total Bilirubin: 0.6 mg/dL (ref 0.2–1.2)
Total Protein: 6.8 g/dL (ref 6.0–8.3)

## 2024-03-15 LAB — MICROALBUMIN / CREATININE URINE RATIO
Creatinine,U: 60.1 mg/dL
Microalb Creat Ratio: 20.5 mg/g (ref 0.0–30.0)
Microalb, Ur: 1.2 mg/dL (ref 0.0–1.9)

## 2024-03-15 MED ORDER — HYDROCODONE-ACETAMINOPHEN 10-325 MG PO TABS
1.0000 | ORAL_TABLET | Freq: Two times a day (BID) | ORAL | 0 refills | Status: DC
  Filled 2024-03-15 – 2024-04-27 (×2): qty 60, 30d supply, fill #0

## 2024-03-15 MED ORDER — HYDROCODONE-ACETAMINOPHEN 10-325 MG PO TABS
1.0000 | ORAL_TABLET | Freq: Two times a day (BID) | ORAL | 0 refills | Status: AC
  Filled 2024-03-15 – 2024-03-30 (×2): qty 60, 30d supply, fill #0

## 2024-03-15 MED ORDER — PREDNISONE 10 MG PO TABS
ORAL_TABLET | ORAL | 0 refills | Status: AC
  Filled 2024-03-15: qty 21, 6d supply, fill #0

## 2024-03-15 MED ORDER — HYDROCODONE-ACETAMINOPHEN 10-325 MG PO TABS
1.0000 | ORAL_TABLET | Freq: Two times a day (BID) | ORAL | 0 refills | Status: DC
  Filled 2024-03-15: qty 60, 30d supply, fill #0

## 2024-03-15 MED ORDER — DOXYCYCLINE HYCLATE 100 MG PO TABS
100.0000 mg | ORAL_TABLET | Freq: Two times a day (BID) | ORAL | 0 refills | Status: DC
  Filled 2024-03-15: qty 14, 7d supply, fill #0

## 2024-03-15 NOTE — Assessment & Plan Note (Signed)
 Continue  celebrex and continue to gradually reduce daily dependence on narcotics gradually given lack of evidence of spinal stenosis by last mri.  Refill history confirmed via [place] Controlled Substance databas, accessed by me today.[name]

## 2024-03-15 NOTE — Assessment & Plan Note (Signed)
 Liver enzymes remain elevated.  Eill need to stop celebrex  and repeat in 3 months

## 2024-03-15 NOTE — Patient Instructions (Addendum)
 You should be using 1000 mg tylenol  twice daily   to maximize its use   Stop the celebrex for one month .  It may be aggravating your fatty liver.  We will repeat your liver enzymes after a month of suspension   Prednisone and doxycycline prescribed today for the COPD exacerbation  Use an antihistamine and steroid nasal spray daily until the pollen is gone  Flush your sinuses with salt water spray ([address] [place],  or [place]) once daily if spending any time outside

## 2024-03-15 NOTE — Assessment & Plan Note (Signed)
 Hydrocodone was reduced to 2 daily in November .  CONTINUE gabapentin and celebrex and tylenol .

## 2024-03-15 NOTE — Assessment & Plan Note (Signed)
 age appropriate education and counseling updated, referrals for preventative services and immunizations addressed, dietary and smoking counseling addressed, most recent labs reviewed.  I have personally reviewed and have noted:   1) the patient's medical and social history 2) The pt's use of alcohol, tobacco, and illicit drugs 3) The patient's current medications and supplements 4) Functional ability including ADL's, fall risk, home safety risk, hearing and visual impairment 5) Diet and physical activities 6) Evidence for depression or mood disorder 7) The patient's height, weight, and BMI have been recorded in the chart 8) I have ordered and reviewed last year's 12  lead EKG and find that there are no concerning  changes and patient is in sinus rhythm.     I have made referrals, and provided counseling and education based on review of the above

## 2024-03-15 NOTE — Progress Notes (Signed)
 The patient is here for the Welcome to  Medicare  preventive visit     has a past medical history of Anxiety, Arthritis, Chronic back pain, Depression, Hoarseness or changing voice (05/12/2012), Hyperlipidemia, Hypertension, Joint pain, Joint swelling, Peripheral edema, Urinary frequency, and Weakness.    reports that he has been smoking cigarettes. He started smoking about 46 years ago. He has a 57.8 pack-year smoking history. His smokeless tobacco use includes chew. He reports that he does not drink alcohol and does not use drugs.   The roster of all physicians providing medical care to patient : Patient Care Team: Sherlene Shams, MD as PCP - General (Internal Medicine) Croitoru, Rachelle Hora, MD as PCP - Cardiology (Cardiology) Sherlene Shams, MD (Internal Medicine) Kieth Brightly, MD (General Surgery)  Activities of daily living:  The patient is 100% independent in all ADLs: dressing, toileting, feeding as well as independent mobility Fall risk was assessed by direct patient evaluation of patient's balance, gait and ability to risk from a chair and from a kneeling position. Home safety : The patient has smoke detectors in the home. They wear seatbelts.  There are no firearms at home. There is no violence in the home.  Patient has seen their eye doctor in the last year.   Visual acuity was assessed today  and was 20/20 with correction lenses. Patient denies hearing difficulty with regard to whispered voices and some television programs and has  deferred audiologic testing in the last year.   There is no risks for hepatitis, STDs or HIV. There is no   history of blood transfusion. They have no travel history to infectious disease endemic areas of the world.  The patient has seen their dentist in the last six month.  They do not  have excessive sun exposure. Discussed the need for sun protection: hats, long sleeves and use of sunscreen if there is significant sun exposure.   Diet: the  importance of a healthy diet is discussed. They do have a healthy diet.  The benefits of regular aerobic exercise were discussed. Patient walks 4 times per week ,  a minimum of 20 minutes.   Depression screen:      03/15/2024   11:18 AM 03/15/2024   11:14 AM 12/21/2023   11:39 AM 09/19/2023    1:21 PM 06/17/2023   11:56 AM  Depression screen PHQ 2/9  Decreased Interest 1 1 2 2  0  Down, Depressed, Hopeless 0 0 0 0 2  PHQ - 2 Score 1 1 2 2 2   Altered sleeping   1 0 2  Tired, decreased energy   0 2 2  Change in appetite   1 0 0  Feeling bad or failure about yourself     1 0  Trouble concentrating     3  Moving slowly or fidgety/restless    2 3  Suicidal thoughts    0 0  PHQ-9 Score   4 7 12   Difficult doing work/chores    Somewhat difficult Somewhat difficult       Cognitive assessment: the patient manages all their financial and personal affairs and is actively engaged. They could relate day,date,year and events; recalled 2/3 objects at 3 minutes; performed clock-face test normally.  The following portions of the patient's history were reviewed and updated as appropriate: allergies, current medications, past family history, past medical history,  past surgical history, past social history  and problem list.  During the course of the visit the  patient was educated and counseled about appropriate screening and preventive services including : fall prevention , diabetes screening, nutrition counseling, colorectal cancer screening, and recommended immunizations   Immunization History  Administered Date(s) Administered   Influenza Split 11/10/2012   Influenza, Seasonal, Injecte, Preservative Fre 09/19/2023   Influenza,inj,Quad PF,6+ Mos 12/17/2013, 10/15/2014, 11/11/2015, 12/27/2016, 09/12/2017, 02/05/2019, 08/24/2019, 09/12/2020, 09/16/2022   PFIZER(Purple Top)SARS-COV-2 Vaccination 02/22/2020, 03/18/2020   PNEUMOCOCCAL CONJUGATE-20 12/21/2023   Tdap 06/24/2014, 05/01/2022   Zoster  Recombinant(Shingrix) 05/01/2022  HMLISTPATIENTFRIENDLY@ Health Maintenance Due  Topic Date Due   Medicare Annual Wellness (AWV)  Never done   Lung Cancer Screening  06/23/2019   Zoster Vaccines- Shingrix (2 of 2) 06/26/2022    Last skin cancer screening :      Vital Signs: BP 106/64   Pulse 64   Ht 5\' 10"  (1.778 m)   Wt 189 lb (85.7 kg)   SpO2 96%   BMI 27.12 kg/m    Exam: General appearance: alert, cooperative and appears stated age Head: Normocephalic, without obvious abnormality, atraumatic Eyes: conjunctivae/corneas clear. PERRL, EOM's intact. Fundi benign. Ears: normal TM's and external ear canals both ears Nose: Nares normal. Septum midline. Mucosa normal. No drainage or sinus tenderness. Throat: lips, mucosa, and tongue normal; teeth and gums normal Neck: no adenopathy, no carotid bruit, no JVD, supple, symmetrical, trachea midline and thyroid not enlarged, symmetric, no tenderness/mass/nodules Lungs: clear to auscultation bilaterally Breasts: normal appearance, no masses or tenderness Heart: regular rate and rhythm, S1, S2 normal, no murmur, click, rub or gallop Abdomen: soft, non-tender; bowel sounds normal; no masses,  no organomegaly Extremities: extremities normal, atraumatic, no cyanosis or edema Pulses: 2+ and symmetric Skin: Skin color, texture, turgor normal. No rashes or lesions Neurologic: Alert and oriented X 3, normal strength and tone. Normal symmetric reflexes. Normal coordination and gait.     End of Life Discussion and Planning   During the course of the visit , End of Life objectives were discussed at length.  Patient does not have a living will in place or a healthcare power of attorney.  Patient  was given printed information about advance directives and encouraged to return after discussing with their family.  Review of Opioid Prescriptions    Patient DOES have a current opioid prescription. Last refill of hydrocodone was march 27 for #60   Patient's risk factors for opioid use disorder was reviewed and includes CHRONIC LOW BACK PAIN WITH RADICULITIS , CHRONIC POST TRAUMATIC LEG PAIN , ANXIETY /INSOMNIA  Treatment of pain using non-opioid alternatives was reviewed  and encouraged   Patient risk for potential substance abuse was assessed and addressed with counselling.     Assessment and Plan : . Problem List Items Addressed This Visit       Unprioritized   Routine general medical examination at a health care facility - Primary   age appropriate education and counseling updated, referrals for preventative services and immunizations addressed, dietary and smoking counseling addressed, most recent labs reviewed.  I have personally reviewed and have noted:   1) the patient's medical and social history 2) The pt's use of alcohol, tobacco, and illicit drugs 3) The patient's current medications and supplements 4) Functional ability including ADL's, fall risk, home safety risk, hearing and visual impairment 5) Diet and physical activities 6) Evidence for depression or mood disorder 7) The patient's height, weight, and BMI have been recorded in the chart 8) I have ordered and reviewed last year's 12  lead EKG and find  that there are no concerning  changes and patient is in sinus rhythm.     I have made referrals, and provided counseling and education based on review of the above       Radiculitis, lumbosacral   Continue  celebrex and continue to gradually reduce daily dependence on narcotics gradually given lack of evidence of spinal stenosis by last mri.  Refill history confirmed via [place] Controlled Substance databas, accessed by me today..       Hypertension   Relevant Orders   Microalbumin / creatinine urine ratio (Completed)   Fatty liver   Liver enzymes remain elevated.  Eill need to stop celebrex  and repeat in 3 months       Relevant Orders   US Abdomen Limited RUQ (LIVER/GB)   Comprehensive metabolic panel with  GFR (Completed)   COPD (chronic obstructive pulmonary disease) (HCC)   TREATING TODAY'S EXACERBATION WITH PREDNISONE AND DOXYCYCLINE       Relevant Medications   predniSONE (DELTASONE) 10 MG tablet   Chronic narcotic dependence (HCC)   Hydrocodone was reduced to 2 daily in November .  CONTINUE gabapentin and celebrex and tylenol .       Other Visit Diagnoses       Colon cancer screening       Relevant Orders   Ambulatory referral to Gastroenterology

## 2024-03-15 NOTE — Assessment & Plan Note (Signed)
 TREATING TODAY'S EXACERBATION WITH PREDNISONE AND DOXYCYCLINE

## 2024-03-16 ENCOUNTER — Encounter: Payer: Self-pay | Admitting: Internal Medicine

## 2024-03-23 ENCOUNTER — Ambulatory Visit
Admission: RE | Admit: 2024-03-23 | Discharge: 2024-03-23 | Disposition: A | Discharge status: Home / Self Care | Source: Ambulatory Visit | Attending: Internal Medicine | Admitting: Internal Medicine

## 2024-03-23 DIAGNOSIS — K760 Fatty (change of) liver, not elsewhere classified: Secondary | ICD-10-CM | POA: Insufficient documentation

## 2024-03-23 DIAGNOSIS — K838 Other specified diseases of biliary tract: Secondary | ICD-10-CM | POA: Diagnosis not present

## 2024-03-23 DIAGNOSIS — R748 Abnormal levels of other serum enzymes: Secondary | ICD-10-CM | POA: Diagnosis not present

## 2024-03-25 ENCOUNTER — Encounter: Payer: Self-pay | Admitting: Internal Medicine

## 2024-03-25 DIAGNOSIS — K838 Other specified diseases of biliary tract: Secondary | ICD-10-CM

## 2024-03-26 ENCOUNTER — Telehealth: Payer: Self-pay

## 2024-03-26 NOTE — Telephone Encounter (Signed)
 Spoke with pt and he stated that he would like to have both of the CT scan done at the same time.

## 2024-03-26 NOTE — Telephone Encounter (Signed)
 Pt is aware.

## 2024-03-26 NOTE — Telephone Encounter (Signed)
 Copied from CRM [id number]. Topic: Clinical - Lab/Test Results >> Mar 23, 2024 10:18 AM Kita Perish H wrote: Reason for CRM: Burdette Carolin with [facility] is calling to notify Dr. Madelon Scheuermann that patients abdomen ultrasound report it in Epic and needs to be reviewed, not a critical call.  [facility] radiology [phone]

## 2024-03-29 ENCOUNTER — Encounter: Payer: Self-pay | Admitting: *Deleted

## 2024-03-30 ENCOUNTER — Other Ambulatory Visit: Payer: Self-pay

## 2024-04-03 ENCOUNTER — Other Ambulatory Visit: Payer: Self-pay

## 2024-04-03 ENCOUNTER — Telehealth: Payer: Self-pay | Admitting: Internal Medicine

## 2024-04-03 MED FILL — Alprazolam Tab 0.25 MG: ORAL | 30 days supply | Qty: 30 | Fill #1 | Status: AC

## 2024-04-03 NOTE — Telephone Encounter (Signed)
 Lft pt vm to call ofc to sch CT. thanks

## 2024-04-11 ENCOUNTER — Other Ambulatory Visit: Payer: Self-pay | Admitting: Internal Medicine

## 2024-04-11 ENCOUNTER — Other Ambulatory Visit: Payer: Self-pay

## 2024-04-11 MED ORDER — GABAPENTIN 300 MG PO CAPS
600.0000 mg | ORAL_CAPSULE | Freq: Three times a day (TID) | ORAL | 2 refills | Status: DC
  Filled 2024-04-11: qty 180, 30d supply, fill #0
  Filled 2024-05-11: qty 180, 30d supply, fill #1
  Filled 2024-06-10: qty 180, 30d supply, fill #2

## 2024-04-12 ENCOUNTER — Ambulatory Visit
Admission: RE | Admit: 2024-04-12 | Discharge: 2024-04-12 | Disposition: A | Discharge status: Home / Self Care | Source: Ambulatory Visit | Attending: Internal Medicine | Admitting: Internal Medicine

## 2024-04-12 DIAGNOSIS — F17210 Nicotine dependence, cigarettes, uncomplicated: Secondary | ICD-10-CM | POA: Diagnosis not present

## 2024-04-12 DIAGNOSIS — Z87891 Personal history of nicotine dependence: Secondary | ICD-10-CM | POA: Insufficient documentation

## 2024-04-12 DIAGNOSIS — K769 Liver disease, unspecified: Secondary | ICD-10-CM | POA: Diagnosis not present

## 2024-04-12 DIAGNOSIS — N3289 Other specified disorders of bladder: Secondary | ICD-10-CM | POA: Diagnosis not present

## 2024-04-12 DIAGNOSIS — Z122 Encounter for screening for malignant neoplasm of respiratory organs: Secondary | ICD-10-CM | POA: Insufficient documentation

## 2024-04-12 DIAGNOSIS — K838 Other specified diseases of biliary tract: Secondary | ICD-10-CM | POA: Diagnosis not present

## 2024-04-12 DIAGNOSIS — R748 Abnormal levels of other serum enzymes: Secondary | ICD-10-CM | POA: Diagnosis not present

## 2024-04-12 MED ORDER — IOHEXOL 9 MG/ML PO SOLN
500.0000 mL | ORAL | Status: AC
  Administered 2024-04-12 (×2): 500 mL via ORAL

## 2024-04-12 MED ORDER — IOHEXOL 300 MG/ML  SOLN
85.0000 mL | Freq: Once | INTRAMUSCULAR | Status: AC | PRN
  Administered 2024-04-12: 85 mL via INTRAVENOUS

## 2024-04-13 ENCOUNTER — Encounter: Payer: Self-pay | Admitting: Internal Medicine

## 2024-04-13 NOTE — Telephone Encounter (Signed)
 noted

## 2024-04-13 NOTE — Telephone Encounter (Signed)
 Pt wanting to start back on Celebrex  informed pt that pcp is currently out office.

## 2024-04-14 ENCOUNTER — Other Ambulatory Visit: Payer: Self-pay | Admitting: Internal Medicine

## 2024-04-14 ENCOUNTER — Other Ambulatory Visit (HOSPITAL_BASED_OUTPATIENT_CLINIC_OR_DEPARTMENT_OTHER): Payer: Self-pay

## 2024-04-14 DIAGNOSIS — K760 Fatty (change of) liver, not elsewhere classified: Secondary | ICD-10-CM

## 2024-04-14 DIAGNOSIS — I10 Essential (primary) hypertension: Secondary | ICD-10-CM

## 2024-04-14 MED ORDER — CELECOXIB 200 MG PO CAPS
200.0000 mg | ORAL_CAPSULE | Freq: Every day | ORAL | 2 refills | Status: DC
  Filled 2024-04-14: qty 30, 30d supply, fill #0
  Filled 2024-05-11: qty 30, 30d supply, fill #1

## 2024-04-15 ENCOUNTER — Other Ambulatory Visit: Payer: Self-pay

## 2024-04-16 ENCOUNTER — Encounter: Payer: Self-pay | Admitting: Family

## 2024-04-16 ENCOUNTER — Telehealth: Payer: Self-pay

## 2024-04-16 ENCOUNTER — Other Ambulatory Visit: Payer: Self-pay | Admitting: Family

## 2024-04-16 DIAGNOSIS — K8689 Other specified diseases of pancreas: Secondary | ICD-10-CM

## 2024-04-16 NOTE — Telephone Encounter (Signed)
 Call report

## 2024-04-16 NOTE — Telephone Encounter (Signed)
 Copied from CRM [id number]. Topic: Clinical - Lab/Test Results >> Apr 16, 2024  8:42 AM Lovett Ruck C wrote: Reason for CRM: Tiffany with [facility] calling to make sure we got patient's results for CT of Abdomen. I checked patient's chart and I could see them in his Imagining. I let her know I would inform clinical team that she called and that patient's results were ready.

## 2024-04-16 NOTE — Telephone Encounter (Signed)
No appts available this week. 

## 2024-04-17 ENCOUNTER — Encounter: Payer: Self-pay | Admitting: Internal Medicine

## 2024-04-17 NOTE — Telephone Encounter (Signed)
 Pt was already aware of Padonda's mychart message and scheduled the lab appt.

## 2024-04-19 ENCOUNTER — Other Ambulatory Visit (INDEPENDENT_AMBULATORY_CARE_PROVIDER_SITE_OTHER)

## 2024-04-19 DIAGNOSIS — K8689 Other specified diseases of pancreas: Secondary | ICD-10-CM | POA: Diagnosis not present

## 2024-04-19 DIAGNOSIS — I10 Essential (primary) hypertension: Secondary | ICD-10-CM | POA: Diagnosis not present

## 2024-04-19 DIAGNOSIS — K760 Fatty (change of) liver, not elsewhere classified: Secondary | ICD-10-CM | POA: Diagnosis not present

## 2024-04-19 LAB — COMPREHENSIVE METABOLIC PANEL WITH GFR
ALT: 21 U/L (ref 0–53)
AST: 18 U/L (ref 0–37)
Albumin: 3.7 g/dL (ref 3.5–5.2)
Alkaline Phosphatase: 152 U/L — ABNORMAL HIGH (ref 39–117)
BUN: 12 mg/dL (ref 6–23)
CO2: 31 meq/L (ref 19–32)
Calcium: 8.9 mg/dL (ref 8.4–10.5)
Chloride: 93 meq/L — ABNORMAL LOW (ref 96–112)
Creatinine, Ser: 0.7 mg/dL (ref 0.40–1.50)
GFR: 96.65 mL/min (ref >60.00)
Glucose, Bld: 96 mg/dL (ref 70–99)
Potassium: 4.1 meq/L (ref 3.5–5.1)
Sodium: 133 meq/L — ABNORMAL LOW (ref 135–145)
Total Bilirubin: 0.5 mg/dL (ref 0.2–1.2)
Total Protein: 6.1 g/dL (ref 6.0–8.3)

## 2024-04-20 LAB — CANCER ANTIGEN 19-9: CA 19-9: 29 U/mL (ref <34)

## 2024-04-25 ENCOUNTER — Encounter: Payer: Self-pay | Admitting: Internal Medicine

## 2024-04-25 ENCOUNTER — Other Ambulatory Visit: Payer: Self-pay | Admitting: Family

## 2024-04-25 ENCOUNTER — Ambulatory Visit
Admission: RE | Admit: 2024-04-25 | Discharge: 2024-04-25 | Disposition: A | Discharge status: Home / Self Care | Source: Ambulatory Visit | Attending: Family | Admitting: Family

## 2024-04-25 DIAGNOSIS — K838 Other specified diseases of biliary tract: Secondary | ICD-10-CM | POA: Diagnosis not present

## 2024-04-25 DIAGNOSIS — K8689 Other specified diseases of pancreas: Secondary | ICD-10-CM

## 2024-04-25 DIAGNOSIS — R918 Other nonspecific abnormal finding of lung field: Secondary | ICD-10-CM | POA: Diagnosis not present

## 2024-04-25 DIAGNOSIS — K828 Other specified diseases of gallbladder: Secondary | ICD-10-CM | POA: Diagnosis not present

## 2024-04-25 DIAGNOSIS — R748 Abnormal levels of other serum enzymes: Secondary | ICD-10-CM | POA: Diagnosis not present

## 2024-04-25 MED ORDER — GADOBUTROL 1 MMOL/ML IV SOLN
10.0000 mL | Freq: Once | INTRAVENOUS | Status: AC | PRN
  Administered 2024-04-25: 10 mL via INTRAVENOUS

## 2024-04-26 ENCOUNTER — Other Ambulatory Visit: Payer: Self-pay

## 2024-04-26 ENCOUNTER — Other Ambulatory Visit: Payer: Self-pay | Admitting: Internal Medicine

## 2024-04-26 MED ORDER — DULOXETINE HCL 30 MG PO CPEP
90.0000 mg | ORAL_CAPSULE | Freq: Every day | ORAL | 11 refills | Status: AC
  Filled 2024-04-26 – 2024-04-27 (×3): qty 90, 30d supply, fill #0
  Filled 2024-05-24 – 2024-06-11 (×5): qty 90, 30d supply, fill #1
  Filled 2024-07-08: qty 90, 30d supply, fill #2
  Filled 2024-08-05: qty 90, 30d supply, fill #3
  Filled 2024-09-13: qty 90, 30d supply, fill #4
  Filled 2024-10-15: qty 90, 30d supply, fill #5
  Filled 2024-11-09: qty 90, 30d supply, fill #6
  Filled 2024-12-13: qty 90, 30d supply, fill #7
  Filled 2025-01-10: qty 90, 30d supply, fill #8

## 2024-04-26 NOTE — Telephone Encounter (Signed)
 I do not see documentation in the chart where he was advised to increase the Cymbalta  30 mg to 3 tablets daily.

## 2024-04-27 ENCOUNTER — Other Ambulatory Visit: Payer: Self-pay

## 2024-04-27 ENCOUNTER — Other Ambulatory Visit: Payer: Self-pay | Admitting: Internal Medicine

## 2024-04-28 ENCOUNTER — Ambulatory Visit: Payer: Self-pay | Admitting: Internal Medicine

## 2024-04-28 ENCOUNTER — Other Ambulatory Visit: Payer: Self-pay | Admitting: Internal Medicine

## 2024-04-28 DIAGNOSIS — J189 Pneumonia, unspecified organism: Secondary | ICD-10-CM

## 2024-04-28 MED ORDER — AZITHROMYCIN 500 MG PO TABS: 500.0000 mg | ORAL_TABLET | Freq: Every day | ORAL | 0 refills | Status: DC

## 2024-04-28 MED ORDER — AMOXICILLIN-POT CLAVULANATE 875-125 MG PO TABS: 1.0000 | ORAL_TABLET | Freq: Two times a day (BID) | ORAL | 0 refills | Status: DC

## 2024-05-01 NOTE — Telephone Encounter (Signed)
 Copied from CRM [id number]. Topic: Appointments - Appointment Scheduling >> May 01, 2024 10:25 AM [facility] T wrote: 2 week follow up appt for medications needed, nothing available until July- please call [phone]

## 2024-05-07 ENCOUNTER — Telehealth: Payer: Self-pay | Admitting: Acute Care

## 2024-05-07 NOTE — Telephone Encounter (Signed)
 Call report received:    IMPRESSION: 1. Peribronchovascular nodularity and ground-glass throughout the right lung, indicative of an infectious bronchiolitis. Lung-RADS 0, incomplete. Recommend follow-up low-dose lung cancer screening CT in 1-2 months from today's study date, after appropriate clinical therapy, as underlying malignancy cannot be excluded. These results will be called to the ordering clinician or representative by the Radiologist Assistant, and communication documented in the PACS or [employer]. 2. Nodular right thyroid . Recommend thyroid  ultrasound. (Ref: J Am Coll Radiol. 2015 Feb;12(2): 143-50). 3.  Aortic atherosclerosis (ICD10-I70.0). 4. Enlarged pulmonic trunk, indicative of pulmonary arterial hypertension. 5.  Emphysema (ICD10-J43.9).     Electronically Signed   By: Shearon Denis M.D.   On: 05/07/2024 10:24

## 2024-05-17 ENCOUNTER — Encounter: Payer: Self-pay | Admitting: Internal Medicine

## 2024-05-17 ENCOUNTER — Telehealth: Payer: Self-pay | Admitting: Acute Care

## 2024-05-17 ENCOUNTER — Telehealth: Payer: Self-pay

## 2024-05-17 DIAGNOSIS — R911 Solitary pulmonary nodule: Secondary | ICD-10-CM

## 2024-05-17 DIAGNOSIS — E041 Nontoxic single thyroid nodule: Secondary | ICD-10-CM | POA: Insufficient documentation

## 2024-05-17 NOTE — Telephone Encounter (Signed)
 Patient returned call to review LDCT results.  CT was LR0. Patient had some possible infection/pneumonia early May that was an incidental finding on CT Abdoment.  Patient states he does not feel he has any symptoms of illness but LDCT showed findings consistent with lingering infection.  Patient has appt with PCP 05/24/24 for follow up.  Emphysema and atherosclerosis noted.  Patient states he does take medication for lipds.  Also noted was possible pulmonary artery hypertension.  Discussed importance of blood pressure control. Patient states he does take medications for blood pressure.  Patient acknowledged understanding of review of results.  He had no questions.  Order placed for follow up LDCT.  PCP will discuss findings further at upcoming OV.

## 2024-05-17 NOTE — Telephone Encounter (Signed)
 Patient returned office phone call and was scheduled for June 19th

## 2024-05-17 NOTE — Telephone Encounter (Signed)
 Called and left VM for pt

## 2024-05-17 NOTE — Telephone Encounter (Signed)
 LMTCB. Need to let pt know that Dr. Madelon Scheuermann would like for him to follow up on the pneumonia. Dr. Tullo would like to see if he can come in next Thursday the 19th at 1 pm.

## 2024-05-17 NOTE — Telephone Encounter (Signed)
 Results reviewed by Dara Ear, NP.  Please call patient to advise of results LR0.  If patient has had symptoms of illness, please follow up with PCP for care.  Other findings were emphysema, atherosclerosis and possible pulmonary artery hypertension.  Also noted was a thyroid  nodule with recommendation for follow up. Dara Ear, NP has sent a note to PCP to please follow up on thyroid  finding.

## 2024-05-17 NOTE — Addendum Note (Signed)
 Addended by: Deann Exon on: 05/17/2024 02:09 PM   Modules accepted: Orders

## 2024-05-18 NOTE — Telephone Encounter (Signed)
 noted

## 2024-05-22 ENCOUNTER — Ambulatory Visit: Payer: Self-pay | Admitting: Internal Medicine

## 2024-05-24 ENCOUNTER — Other Ambulatory Visit: Payer: Self-pay

## 2024-05-24 ENCOUNTER — Encounter: Payer: Self-pay | Admitting: Internal Medicine

## 2024-05-24 ENCOUNTER — Ambulatory Visit (INDEPENDENT_AMBULATORY_CARE_PROVIDER_SITE_OTHER): Admitting: Internal Medicine

## 2024-05-24 VITALS — BP 128/74 | HR 55 | Temp 98.5°F | Resp 12 | Wt 184.8 lb

## 2024-05-24 DIAGNOSIS — M79605 Pain in left leg: Secondary | ICD-10-CM | POA: Diagnosis not present

## 2024-05-24 DIAGNOSIS — J189 Pneumonia, unspecified organism: Secondary | ICD-10-CM | POA: Insufficient documentation

## 2024-05-24 DIAGNOSIS — J432 Centrilobular emphysema: Secondary | ICD-10-CM

## 2024-05-24 DIAGNOSIS — F1120 Opioid dependence, uncomplicated: Secondary | ICD-10-CM

## 2024-05-24 DIAGNOSIS — M79604 Pain in right leg: Secondary | ICD-10-CM

## 2024-05-24 DIAGNOSIS — K760 Fatty (change of) liver, not elsewhere classified: Secondary | ICD-10-CM

## 2024-05-24 DIAGNOSIS — G8929 Other chronic pain: Secondary | ICD-10-CM

## 2024-05-24 DIAGNOSIS — J219 Acute bronchiolitis, unspecified: Secondary | ICD-10-CM | POA: Diagnosis not present

## 2024-05-24 MED ORDER — HYDROCODONE-ACETAMINOPHEN 10-325 MG PO TABS
1.0000 | ORAL_TABLET | Freq: Two times a day (BID) | ORAL | 0 refills | Status: DC
  Filled 2024-05-24: qty 60, 30d supply, fill #0

## 2024-05-24 MED ORDER — HYDROCODONE-ACETAMINOPHEN 10-325 MG PO TABS
1.0000 | ORAL_TABLET | Freq: Two times a day (BID) | ORAL | 0 refills | Status: AC
  Filled 2024-05-24 – 2024-05-25 (×2): qty 60, 30d supply, fill #0

## 2024-05-24 NOTE — Assessment & Plan Note (Signed)
 14 day sample of Trelegy given to patient today and proper use demonstrated .a/w/a/ oral care to prevent thrush.  Referring to [place] pulmonology offered and accepted.  Strongly advised to quit smoking

## 2024-05-24 NOTE — Assessment & Plan Note (Addendum)
 Liver enzymes remain elevated.  Abd ultrasound done,  MRI liver done.  Dilated bile dugs with no masses.  Celebrex  stopped. Will  repeat in 3 months

## 2024-05-24 NOTE — Progress Notes (Addendum)
 Subjective:  Patient ID: Tyrone Mcdowell, male    DOB: Nov 16, 1956  Age: 68 y.o. MRN: [id number]  CC: The primary encounter diagnosis was Bronchiolitis. Diagnoses of Centrilobular emphysema (HCC), Fatty liver, Community acquired pneumonia, bilateral, Chronic narcotic dependence (HCC), and Chronic pain of both lower extremities were also pertinent to this visit.   HPI Tyrone Mcdowell presents for  Chief Complaint  Patient presents with   follow up on pneumonia  F Patient was treated empirically with dual antibiotics for lobar pneumonia in late May after his CT chest  noted bilateral  lower lobe and RML infiltrates .  His cough has resolved except when lying supine, feels like a tickle,  non productive,  quiets down after a few minutes . Not waking him up at night .  Not short of breath at rest,  no fevers both before or now.  Still smoking a little bit   MRI pancreas and CA 19-9 were done after a CT of the abd and pelvis was done because of dilated bile ducts  on abd ultasound .  CT was concerning for a pancreatic mass (issue was raised by abdominal ultrasound which noted dilated bile ducts. )   CT Chest also noted paraseptal and centrilobular emphysema and smoking induced bronchiolitis.  He has no prior pulmonology evaluation.    Outpatient Medications Prior to Visit  Medication Sig Dispense Refill   albuterol  (VENTOLIN  HFA) 108 (90 Base) MCG/ACT inhaler Inhale 2 puffs into the lungs every 6 (six) hours as needed for wheezing or shortness of breath. 8.5 g 11   ALPRAZolam  (XANAX ) 0.25 MG tablet Take 1 tablet (0.25 mg total) by mouth at bedtime as needed for anxiety. 30 tablet 2   amLODipine  (NORVASC ) 10 MG tablet Take 1 tablet (10 mg total) by mouth daily. 90 tablet 1   atorvastatin  (LIPITOR) 20 MG tablet Take 1 tablet (20 mg total) by mouth daily at 6 PM. 90 tablet 1   cloNIDine  (CATAPRES ) 0.1 MG tablet Take 1 tablet (0.1 mg total) by mouth 2 (two) times daily. 180 tablet 3    clopidogrel  (PLAVIX ) 75 MG tablet Take 1 tablet (75 mg total) by mouth daily. 90 tablet 1   DULoxetine  (CYMBALTA ) 30 MG capsule Take 3 capsules (90 mg total) by mouth daily. In divided doses 90 capsule 11   furosemide  (LASIX ) 20 MG tablet Take 1 tablet (20 mg total) by mouth daily as needed. 90 tablet 2   gabapentin  (NEURONTIN ) 300 MG capsule Take 2 capsules (600 mg total) by mouth 3 (three) times daily. 180 capsule 2   montelukast  (SINGULAIR ) 10 MG tablet Take 1 tablet (10 mg total) by mouth at bedtime. 90 tablet 3   pantoprazole  (PROTONIX ) 40 MG tablet Take 1 tablet (40 mg total) by mouth daily. 90 tablet 3   potassium chloride  SA (KLOR-CON  M) 20 MEQ tablet Take 1 tablet (20 mEq total) by mouth daily. 2 on days with furosemide  40 tablet 2   tiZANidine  (ZANAFLEX ) 4 MG tablet Take 1 tablet (4 mg total) by mouth 3 (three) times daily. 90 tablet 1   valsartan -hydrochlorothiazide  (DIOVAN -HCT) 160-25 MG tablet Take 1 tablet by mouth 2 (two) times daily. 180 tablet 3   celecoxib  (CELEBREX ) 200 MG capsule Take 1 capsule (200 mg total) by mouth daily. 30 capsule 2   HYDROcodone -acetaminophen  (NORCO) 10-325 MG tablet Take 1 tablet by mouth 2 (two) times daily for 60 doses. 60 tablet 0   amoxicillin -clavulanate (AUGMENTIN ) 875-125 MG tablet Take 1  tablet by mouth 2 (two) times daily. 14 tablet 0   azithromycin  (ZITHROMAX ) 500 MG tablet Take 1 tablet (500 mg total) by mouth daily. 7 tablet 0   No facility-administered medications prior to visit.    Review of Systems;  Patient denies headache, fevers, malaise, unintentional weight loss, skin rash, eye pain, sinus congestion and sinus pain, sore throat, dysphagia,  hemoptysis , cough, dyspnea, wheezing, chest pain, palpitations, orthopnea, edema, abdominal pain, nausea, melena, diarrhea, constipation, flank pain, dysuria, hematuria, urinary  Frequency, nocturia, numbness, tingling, seizures,  Focal weakness, Loss of consciousness,  Tremor, insomnia,  depression, anxiety, and suicidal ideation.      Objective:  BP 128/74   Pulse (!) 55   Temp 98.5 F (36.9 C)   Resp 12   Wt 184 lb 12.8 oz (83.8 kg)   SpO2 96%   BMI 26.52 kg/m   BP Readings from Last 3 Encounters:  05/24/24 128/74  03/15/24 106/64  12/21/23 120/68    Wt Readings from Last 3 Encounters:  05/24/24 184 lb 12.8 oz (83.8 kg)  04/12/24 189 lb (85.7 kg)  03/15/24 189 lb (85.7 kg)    Physical Exam Vitals reviewed.  Constitutional:      General: He is not in acute distress.    Appearance: Normal appearance. He is normal weight. He is not ill-appearing, toxic-appearing or diaphoretic.  HENT:     Head: Normocephalic.   Eyes:     General: No scleral icterus.       Right eye: No discharge.        Left eye: No discharge.     Conjunctiva/sclera: Conjunctivae normal.    Cardiovascular:     Rate and Rhythm: Normal rate and regular rhythm.     Heart sounds: Normal heart sounds.  Pulmonary:     Effort: Pulmonary effort is normal. No respiratory distress.     Breath sounds: Normal breath sounds.   Musculoskeletal:        General: Normal range of motion.     Cervical back: Normal range of motion.   Skin:    General: Skin is warm and dry.   Neurological:     General: No focal deficit present.     Mental Status: He is alert and oriented to person, place, and time. Mental status is at baseline.   Psychiatric:        Mood and Affect: Mood normal.        Behavior: Behavior normal.        Thought Content: Thought content normal.        Judgment: Judgment normal.     Lab Results  Component Value Date   HGBA1C 5.2 09/19/2023   HGBA1C 5.3 01/17/2023   HGBA1C 5.6 01/04/2022    Lab Results  Component Value Date   CREATININE 0.70 04/19/2024   CREATININE 0.76 03/15/2024   CREATININE 0.91 12/21/2023    Lab Results  Component Value Date   WBC 5.1 09/19/2023   HGB 12.8 (L) 09/19/2023   HCT 38.0 (L) 09/19/2023   PLT 245.0 09/19/2023   GLUCOSE 96  04/19/2024   CHOL 165 12/21/2023   TRIG 124.0 12/21/2023   HDL 62.50 12/21/2023   LDLDIRECT 66.0 09/19/2023   LDLCALC 77 12/21/2023   ALT 21 04/19/2024   AST 18 04/19/2024   NA 133 (L) 04/19/2024   K 4.1 04/19/2024   CL 93 (L) 04/19/2024   CREATININE 0.70 04/19/2024   BUN 12 04/19/2024   CO2 31 04/19/2024  TSH 1.79 09/19/2023   PSA 0.15 01/17/2023   INR 1.07 05/08/2018   HGBA1C 5.2 09/19/2023   MICROALBUR 1.2 03/15/2024    MR 3D Recon At Scanner Result Date: 05/21/2024 CLINICAL DATA:  Dilated common duct. Elevated liver enzymes. Pancreatic lesion. EXAM: MRI ABDOMEN WITHOUT AND WITH CONTRAST TECHNIQUE: Multiplanar multisequence MR imaging of the abdomen was performed both before and after the administration of intravenous contrast. CONTRAST:  10mL GADAVIST  GADOBUTROL  1 MMOL/ML IV SOLN COMPARISON:  CT 04/12/2024.  Ultrasound 03/23/2024. FINDINGS: Lower chest: Since the prior CT scan there is developed significant parenchymal opacities identified in the middle lobe with some potential volume loss versus a consolidative area. Tree-in-bud like nodularity again identified as well in the right lower lobe. Recommend further evaluation such is follow-up CT scan or chest x-ray. Hepatobiliary: Patent portal vein. Liver has a few scattered bright T2, low T1 nonenhancing foci consistent with benign cystic foci. Example segment 7 measures 6 mm. No specific imaging follow-up. No signal dropout on out of phase imaging in the liver. Gallbladder is distended. No filling defect. No abnormal wall enhancement. There is diffuse moderate intrahepatic biliary ductal dilatation. Extrahepatic common duct is also ectatic. Proximal diameter of 13 mm as previously described. Duct does taper at the pancreatic head. No clear filling defect. No abnormal wall enhancement. No restricted diffusion. Pancreas: Pancreas is with slight signal loss on T1 precontrast. Please correlate for any history of previous episodes of  pancreatitis. No restricted diffusion along the pancreas. No enhancing mass. Slight ectasia of the pancreatic duct with maximal diameter towards the head/neck at 4 mm. No abrupt caliber change. No dilatation towards the tail. Spleen:  Spleen is nonenlarged.  Preserved enhancement and signal. Adrenals/Urinary Tract: No enhancing renal mass or collecting system dilatation. Preserved adrenal glands. Stomach/Bowel: Visualized portions of the small large bowel in the abdomen are nondilated. Stomach is nondilated. Vascular/Lymphatic: Atherosclerotic changes along the aorta. Normal caliber aorta and IVC. Other: No free fluid. Motion artifact throughout the examination limiting evaluation. Musculoskeletal: Degenerative changes along the spine. Susceptibility artifact related to the fixation hardware L at the L5-S1 level. IMPRESSION: No pancreatic mass identified. There is some atrophy as well as some signal changes. Please correlate for any history of previous episodes of pancreatitis. Mild ectasia of the pancreatic duct towards the head neck region. Moderate dilatation of the biliary tree. No clear filling defect, abnormal wall enhancement or thickening. Etiology is uncertain. In principle a subtle ampullary lesion is difficult to completely exclude. Additional workup as clinically appropriate Evaluation limited by motion. Significant opacity identified in the middle lobe with either volume loss or infiltrate. This is new from a CT scan of 04/12/2024. The tree-in-bud like nodular opacities identified in the right lower lobe are also potentially progressive. Please correlate with clinical presentation recommend further evaluation. Electronically Signed   By: Adrianna Horde M.D.   On: 05/21/2024 08:58   MR Abdomen W Wo Contrast Result Date: 04/27/2024 CLINICAL DATA:  Dilated common duct. Elevated liver enzymes. Pancreatic lesion. EXAM: MRI ABDOMEN WITHOUT AND WITH CONTRAST TECHNIQUE: Multiplanar multisequence MR imaging of  the abdomen was performed both before and after the administration of intravenous contrast. CONTRAST:  10mL GADAVIST  GADOBUTROL  1 MMOL/ML IV SOLN COMPARISON:  CT 04/12/2024.  Ultrasound 03/23/2024. FINDINGS: Lower chest: Since the prior CT scan there is developed significant parenchymal opacities identified in the middle lobe with some potential volume loss versus a consolidative area. Tree-in-bud like nodularity again identified as well in the right lower lobe. Recommend  further evaluation such is follow-up CT scan or chest x-ray. Hepatobiliary: Patent portal vein. Liver has a few scattered bright T2, low T1 nonenhancing foci consistent with benign cystic foci. Example segment 7 measures 6 mm. No specific imaging follow-up. No signal dropout on out of phase imaging in the liver. Gallbladder is distended. No filling defect. No abnormal wall enhancement. There is diffuse moderate intrahepatic biliary ductal dilatation. Extrahepatic common duct is also ectatic. Proximal diameter of 13 mm as previously described. Duct does taper at the pancreatic head. No clear filling defect. No abnormal wall enhancement. No restricted diffusion. Pancreas: Pancreas is with slight signal loss on T1 precontrast. Please correlate for any history of previous episodes of pancreatitis. No restricted diffusion along the pancreas. No enhancing mass. Slight ectasia of the pancreatic duct with maximal diameter towards the head/neck at 4 mm. No abrupt caliber change. No dilatation towards the tail. Spleen:  Spleen is nonenlarged.  Preserved enhancement and signal. Adrenals/Urinary Tract: No enhancing renal mass or collecting system dilatation. Preserved adrenal glands. Stomach/Bowel: Visualized portions of the small large bowel in the abdomen are nondilated. Stomach is nondilated. Vascular/Lymphatic: Atherosclerotic changes along the aorta. Normal caliber aorta and IVC. Other: No free fluid. Motion artifact throughout the examination limiting  evaluation. Musculoskeletal: Degenerative changes along the spine. Susceptibility artifact related to the fixation hardware L at the L5-S1 level. IMPRESSION: No pancreatic mass identified. There is some atrophy as well as some signal changes. Please correlate for any history of previous episodes of pancreatitis. Mild ectasia of the pancreatic duct towards the head neck region. Moderate dilatation of the biliary tree. No clear filling defect, abnormal wall enhancement or thickening. Etiology is uncertain. In principle a subtle ampullary lesion is difficult to completely exclude. Additional workup as clinically appropriate Evaluation limited by motion. Significant opacity identified in the middle lobe with either volume loss or infiltrate. This is new from a CT scan of 04/12/2024. The tree-in-bud like nodular opacities identified in the right lower lobe are also potentially progressive. Please correlate with clinical presentation recommend further evaluation. Electronically Signed   By: Adrianna Horde M.D.   On: 04/27/2024 17:32    Assessment & Plan:  .Bronchiolitis -     Pulmonary Visit  Centrilobular emphysema (HCC) Assessment & Plan: 14 day sample of Trelegy given to patient today and proper use demonstrated .a/w/a/ oral care to prevent thrush.  Referring to  pulmonology offered and accepted.  Strongly advised to quit smoking   Orders: -     Pulmonary Visit  Fatty liver Assessment & Plan: Liver enzymes remain elevated.  Abd ultrasound done,  MRI liver done.  Dilated bile dugs with no masses.  Celebrex  stopped. Will  repeat in 3 months    Community acquired pneumonia, bilateral Assessment & Plan: Treated on May 24 with augmentin /azithromycin  with resolution of symptoms. Rpeat chest x ray needed in mid July    Chronic narcotic dependence [facility]) Assessment & Plan: Hydrocodone  was reduced to 2 daily in November .  CONTINUE gabapentin   and tylenol  . Celebrex  stopped due to fatty liver and  persistent liver enzyme elevation    Chronic pain of both lower extremities Assessment & Plan: Secondary to remote trauma, lumbar disk disease managed with hydrocodone  bid. Aaron Aas Refill history confirmed via [place] Controlled Substance databas, accessed by me today.. last refill of hydrocoeon lwas May 23 .  Refills for 3 months placed on file at pharmacy    Other orders -     HYDROcodone -Acetaminophen ; Take 1 tablet  by mouth 2 (two) times daily.  Dispense: 60 tablet; Refill: 0 -     HYDROcodone -Acetaminophen ; Take 1 tablet by mouth 2 (two) times daily.  Dispense: 60 tablet; Refill: 0 -     HYDROcodone -Acetaminophen ; Take 1 tablet by mouth 2 (two) times daily for 60 doses.  Dispense: 60 tablet; Refill: 0     I spent 44 minutes on the day of this face to face encounter reviewing patient's  prior relevant surgical and non surgical procedures, recent  labs and imaging studies, counseling on pulmonary isease and tobacco cessation   reviewing the assessment and plan with patient, and post visit ordering and reviewing of  diagnostics and therapeutics with patient  .   Follow-up: Return in about 3 months (around 08/24/2024) for chronic pain management.   Thersia Flax, MD

## 2024-05-24 NOTE — Patient Instructions (Addendum)
 Your lungs appear to be in rough shape due to emphysema and inflammation of the bronchioles (bronchiolitis).  The bronchiolitis should improve with treatment of the pneumonia AND QUITTING SMOKING   I want you to see a pulmonologist and have made a referral to [facility] here in [place]  I want you to use the Trelegy inhaler once daily as a MAINTENANCE. INHALER.  RINSE YOUR MOUTH AFTER USING TO AVOID GETTING A YEAST INFECTION (THRUSH)   WE WILL NEED TO REPEAT YOUR CHEST X RAY IN MID JULY .   YOU HAVE TO QUIT SMOKING

## 2024-05-24 NOTE — Assessment & Plan Note (Signed)
 Treated on May 24 with augmentin /azithromycin  with resolution of symptoms. Rpeat chest x ray needed in mid July

## 2024-05-24 NOTE — Assessment & Plan Note (Addendum)
 Secondary to remote trauma, lumbar disk disease managed with hydrocodone  bid. Tyrone Mcdowell Refill history confirmed via [place] Controlled Substance databas, accessed by me today.. last refill of hydrocoeon lwas May 23 .  Refills for 3 months placed on file at pharmacy

## 2024-05-24 NOTE — Assessment & Plan Note (Signed)
 Hydrocodone  was reduced to 2 daily in November .  CONTINUE gabapentin   and tylenol  . Celebrex  stopped due to fatty liver and persistent liver enzyme elevation

## 2024-05-25 ENCOUNTER — Other Ambulatory Visit: Payer: Self-pay

## 2024-05-25 MED FILL — Alprazolam Tab 0.25 MG: ORAL | 30 days supply | Qty: 30 | Fill #2 | Status: AC

## 2024-06-01 ENCOUNTER — Other Ambulatory Visit: Payer: Self-pay

## 2024-06-10 ENCOUNTER — Other Ambulatory Visit: Payer: Self-pay

## 2024-06-10 ENCOUNTER — Other Ambulatory Visit: Payer: Self-pay | Admitting: Internal Medicine

## 2024-06-11 ENCOUNTER — Other Ambulatory Visit: Payer: Self-pay | Admitting: Internal Medicine

## 2024-06-11 ENCOUNTER — Other Ambulatory Visit: Payer: Self-pay

## 2024-06-11 MED ORDER — AMLODIPINE BESYLATE 10 MG PO TABS
10.0000 mg | ORAL_TABLET | Freq: Every day | ORAL | 1 refills | Status: DC
  Filled 2024-06-11 – 2024-07-22 (×2): qty 90, 90d supply, fill #0
  Filled 2024-10-20: qty 90, 90d supply, fill #1

## 2024-06-11 MED FILL — Potassium Chloride Microencapsulated Crys ER Tab 20 mEq: ORAL | 40 days supply | Qty: 40 | Fill #0 | Status: AC

## 2024-06-11 NOTE — Telephone Encounter (Signed)
 Refilled: 09/23/2023 Last OV: 03/15/2024 Next OV: 08/28/2024

## 2024-06-12 ENCOUNTER — Other Ambulatory Visit: Payer: Self-pay

## 2024-06-12 ENCOUNTER — Other Ambulatory Visit: Payer: Self-pay | Admitting: Internal Medicine

## 2024-06-14 ENCOUNTER — Encounter: Payer: Self-pay | Admitting: Internal Medicine

## 2024-06-14 ENCOUNTER — Other Ambulatory Visit: Payer: Self-pay

## 2024-06-15 ENCOUNTER — Other Ambulatory Visit: Payer: Self-pay

## 2024-06-15 NOTE — Telephone Encounter (Signed)
 Per last note stated pt to stop taking celebrex  due to fatty liver. Pt requesting refill

## 2024-06-18 NOTE — Telephone Encounter (Signed)
 Pt is wanting to know if there is something else that can be sent in for him to take for the pain since he can not take the celebrex  any longer.

## 2024-06-19 ENCOUNTER — Encounter: Payer: Self-pay | Admitting: Internal Medicine

## 2024-06-19 ENCOUNTER — Encounter: Payer: Self-pay | Admitting: Acute Care

## 2024-06-20 ENCOUNTER — Encounter: Payer: Self-pay | Admitting: Internal Medicine

## 2024-06-21 ENCOUNTER — Encounter: Payer: Self-pay | Admitting: Internal Medicine

## 2024-06-21 ENCOUNTER — Other Ambulatory Visit: Payer: Self-pay

## 2024-06-21 MED ORDER — HYDROCODONE-ACETAMINOPHEN 10-325 MG PO TABS
1.0000 | ORAL_TABLET | Freq: Three times a day (TID) | ORAL | 0 refills | Status: DC | PRN
  Filled 2024-06-21 – 2024-06-22 (×2): qty 90, 30d supply, fill #0

## 2024-06-22 ENCOUNTER — Other Ambulatory Visit: Payer: Self-pay

## 2024-07-06 ENCOUNTER — Other Ambulatory Visit: Payer: Self-pay

## 2024-07-08 ENCOUNTER — Other Ambulatory Visit: Payer: Self-pay | Admitting: Internal Medicine

## 2024-07-09 ENCOUNTER — Other Ambulatory Visit: Payer: Self-pay

## 2024-07-09 ENCOUNTER — Other Ambulatory Visit: Payer: Self-pay | Admitting: Internal Medicine

## 2024-07-09 MED FILL — Gabapentin Cap 300 MG: ORAL | 30 days supply | Qty: 180 | Fill #0 | Status: AC

## 2024-07-10 ENCOUNTER — Other Ambulatory Visit: Payer: Self-pay

## 2024-07-10 ENCOUNTER — Other Ambulatory Visit: Payer: Self-pay | Admitting: Internal Medicine

## 2024-07-11 ENCOUNTER — Other Ambulatory Visit: Payer: Self-pay

## 2024-07-11 MED FILL — Alprazolam Tab 0.25 MG: ORAL | 30 days supply | Qty: 30 | Fill #0 | Status: AC

## 2024-07-20 ENCOUNTER — Other Ambulatory Visit: Payer: Self-pay | Admitting: Internal Medicine

## 2024-07-20 ENCOUNTER — Other Ambulatory Visit: Payer: Self-pay

## 2024-07-20 MED FILL — Hydrocodone-Acetaminophen Tab 10-325 MG: ORAL | 4 days supply | Qty: 10 | Fill #0 | Status: AC

## 2024-07-20 MED FILL — Gabapentin Cap 300 MG: ORAL | 30 days supply | Qty: 180 | Fill #0 | Status: CN

## 2024-07-20 MED FILL — Potassium Chloride Microencapsulated Crys ER Tab 20 mEq: ORAL | 40 days supply | Qty: 40 | Fill #1 | Status: AC

## 2024-07-20 MED FILL — Hydrocodone-Acetaminophen Tab 10-325 MG: ORAL | 26 days supply | Qty: 80 | Fill #0 | Status: AC

## 2024-07-22 ENCOUNTER — Other Ambulatory Visit: Payer: Self-pay | Admitting: Internal Medicine

## 2024-07-22 ENCOUNTER — Other Ambulatory Visit: Payer: Self-pay

## 2024-07-23 ENCOUNTER — Other Ambulatory Visit: Payer: Self-pay

## 2024-07-23 MED ORDER — ATORVASTATIN CALCIUM 20 MG PO TABS
20.0000 mg | ORAL_TABLET | Freq: Every day | ORAL | 1 refills | Status: DC
  Filled 2024-07-23: qty 90, 90d supply, fill #0
  Filled 2024-10-20: qty 90, 90d supply, fill #1

## 2024-08-07 ENCOUNTER — Other Ambulatory Visit: Payer: Self-pay

## 2024-08-07 MED FILL — Gabapentin Cap 300 MG: ORAL | 30 days supply | Qty: 180 | Fill #0 | Status: AC

## 2024-08-10 ENCOUNTER — Ambulatory Visit: Admitting: Student in an Organized Health Care Education/Training Program

## 2024-08-10 ENCOUNTER — Other Ambulatory Visit: Payer: Self-pay

## 2024-08-10 ENCOUNTER — Encounter: Payer: Self-pay | Admitting: Student in an Organized Health Care Education/Training Program

## 2024-08-10 VITALS — BP 110/58 | HR 60 | Temp 97.9°F | Ht 70.0 in | Wt 184.6 lb

## 2024-08-10 DIAGNOSIS — R0602 Shortness of breath: Secondary | ICD-10-CM

## 2024-08-10 DIAGNOSIS — F17210 Nicotine dependence, cigarettes, uncomplicated: Secondary | ICD-10-CM

## 2024-08-10 DIAGNOSIS — R911 Solitary pulmonary nodule: Secondary | ICD-10-CM | POA: Diagnosis not present

## 2024-08-10 DIAGNOSIS — F17200 Nicotine dependence, unspecified, uncomplicated: Secondary | ICD-10-CM

## 2024-08-10 MED ORDER — NICOTINE 21 MG/24HR TD PT24
21.0000 mg | MEDICATED_PATCH | TRANSDERMAL | 0 refills | Status: AC
  Filled 2024-08-10: qty 28, 28d supply, fill #0
  Filled 2024-08-10: qty 42, 42d supply, fill #0
  Filled 2024-08-14: qty 14, 14d supply, fill #0
  Filled 2024-09-13: qty 14, 14d supply, fill #1

## 2024-08-10 MED ORDER — NICOTINE 7 MG/24HR TD PT24
7.0000 mg | MEDICATED_PATCH | TRANSDERMAL | 0 refills | Status: AC
  Filled 2024-08-10 (×2): qty 14, 14d supply, fill #0

## 2024-08-10 MED ORDER — NICOTINE POLACRILEX 2 MG MT LOZG
2.0000 mg | LOZENGE | OROMUCOSAL | 3 refills | Status: AC | PRN
  Filled 2024-08-10: qty 72, 6d supply, fill #0

## 2024-08-10 MED ORDER — NICOTINE 14 MG/24HR TD PT24
14.0000 mg | MEDICATED_PATCH | TRANSDERMAL | 0 refills | Status: AC
Start: 2024-08-10 — End: 2024-08-24
  Filled 2024-08-10 (×2): qty 14, 14d supply, fill #0

## 2024-08-10 NOTE — Progress Notes (Signed)
 Assessment & Plan:   1. Shortness of breath (Primary)  He does have symptoms that are suggestive of an obstructive lung disease.  He is short of breath with exertion and reports nocturnal wheezing. In the setting of smoking this does increase the risk of COPD.  He has some family history of asthma in his brother.  His max eosinophil count was 300.  I will obtain a pulmonary function test to assess spirometry for obstruction, lung volumes for air trapping, and DLCO for impaired diffusion.  Should this be consistent with COPD, we will initiate him on a long-acting inhaler.  - Pulmonary Function Test; Future  2. Lung nodule seen on imaging study  Found to have multiple tree-in-bud nodules and peribronchovascular nodularity throughout the right lung.  This is consistent with an infectious process.  He was treated with a course of antibiotics in May 2025 around the time of the CT scan.  I will repeat the CT scan and if continues to show the same findings, we will consider proceeding with bronchoscopy for biopsy.  - CT CHEST WO CONTRAST; Future  3. Tobacco use disorder  He continues to smoke up to a pack a day.  I counseled the patient extensively about the importance of smoking cessation and offered him cessation aids.  He is willing to try to quit with these cessation aids.  - nicotine  (NICODERM CQ  - DOSED IN MG/24 HOURS) 14 mg/24hr patch; Place 1 patch (14 mg total) onto the skin daily for 14 days.  Dispense: 14 patch; Refill: 0 - nicotine  (NICODERM CQ  - DOSED IN MG/24 HOURS) 21 mg/24hr patch; Place 1 patch (21 mg total) onto the skin daily.  Dispense: 42 patch; Refill: 0 - nicotine  polacrilex (NICOTINE  MINI) 2 MG lozenge; Take 1 lozenge (2 mg total) by mouth every 2 (two) hours as needed for smoking cessation.  Dispense: 72 lozenge; Refill: 3 - nicotine  (NICODERM CQ  - DOSED IN MG/24 HR) 7 mg/24hr patch; Place 1 patch (7 mg total) onto the skin daily for 14 days.  Dispense: 14 patch; Refill:  0   Return in about 2 months (around 10/10/2024).  I spent 63 minutes caring for this patient today, including preparing to see the patient, obtaining a medical history , reviewing a separately obtained history, performing a medically appropriate examination and/or evaluation, counseling and educating the patient/family/caregiver, ordering medications, tests, or procedures, documenting clinical information in the electronic health record, and independently interpreting results (not separately reported/billed) and communicating results to the patient/family/caregiver. 3 minutes utilized during today's visit to counsel the patient regarding the importance and strategies of smoking cessation.  Belva November, MD  Pulmonary Critical Care  End of visit medications:  Meds ordered this encounter  Medications   nicotine  (NICODERM CQ  - DOSED IN MG/24 HOURS) 14 mg/24hr patch    Sig: Place 1 patch (14 mg total) onto the skin daily for 14 days.    Dispense:  14 patch    Refill:  0   nicotine  (NICODERM CQ  - DOSED IN MG/24 HOURS) 21 mg/24hr patch    Sig: Place 1 patch (21 mg total) onto the skin daily.    Dispense:  42 patch    Refill:  0   nicotine  polacrilex (NICOTINE  MINI) 2 MG lozenge    Sig: Take 1 lozenge (2 mg total) by mouth every 2 (two) hours as needed for smoking cessation.    Dispense:  72 lozenge    Refill:  3   nicotine  (NICODERM CQ  -  DOSED IN MG/24 HR) 7 mg/24hr patch    Sig: Place 1 patch (7 mg total) onto the skin daily for 14 days.    Dispense:  14 patch    Refill:  0     Current Outpatient Medications:    albuterol  (VENTOLIN  HFA) 108 (90 Base) MCG/ACT inhaler, Inhale 2 puffs into the lungs every 6 (six) hours as needed for wheezing or shortness of breath., Disp: 8.5 g, Rfl: 11   ALPRAZolam  (XANAX ) 0.25 MG tablet, Take 1 tablet (0.25 mg total) by mouth at bedtime as needed for anxiety., Disp: 30 tablet, Rfl: 2   amLODipine  (NORVASC ) 10 MG tablet, Take 1 tablet (10 mg  total) by mouth daily., Disp: 90 tablet, Rfl: 1   atorvastatin  (LIPITOR) 20 MG tablet, Take 1 tablet (20 mg total) by mouth daily at 6 PM., Disp: 90 tablet, Rfl: 1   cloNIDine  (CATAPRES ) 0.1 MG tablet, Take 1 tablet (0.1 mg total) by mouth 2 (two) times daily., Disp: 180 tablet, Rfl: 3   clopidogrel  (PLAVIX ) 75 MG tablet, Take 1 tablet (75 mg total) by mouth daily., Disp: 90 tablet, Rfl: 1   DULoxetine  (CYMBALTA ) 30 MG capsule, Take 3 capsules (90 mg total) by mouth daily in divided doses, Disp: 90 capsule, Rfl: 11   furosemide  (LASIX ) 20 MG tablet, Take 1 tablet (20 mg total) by mouth daily as needed., Disp: 90 tablet, Rfl: 2   gabapentin  (NEURONTIN ) 300 MG capsule, Take 2 capsules (600 mg total) by mouth 3 (three) times daily., Disp: 180 capsule, Rfl: 0   HYDROcodone -acetaminophen  (NORCO) 10-325 MG tablet, Take 1 tablet by mouth 3 (three) times daily as needed for up to 90 doses., Disp: 90 tablet, Rfl: 0   montelukast  (SINGULAIR ) 10 MG tablet, Take 1 tablet (10 mg total) by mouth at bedtime., Disp: 90 tablet, Rfl: 3   nicotine  (NICODERM CQ  - DOSED IN MG/24 HOURS) 14 mg/24hr patch, Place 1 patch (14 mg total) onto the skin daily for 14 days., Disp: 14 patch, Rfl: 0   nicotine  (NICODERM CQ  - DOSED IN MG/24 HOURS) 21 mg/24hr patch, Place 1 patch (21 mg total) onto the skin daily., Disp: 42 patch, Rfl: 0   nicotine  (NICODERM CQ  - DOSED IN MG/24 HR) 7 mg/24hr patch, Place 1 patch (7 mg total) onto the skin daily for 14 days., Disp: 14 patch, Rfl: 0   nicotine  polacrilex (NICOTINE  MINI) 2 MG lozenge, Take 1 lozenge (2 mg total) by mouth every 2 (two) hours as needed for smoking cessation., Disp: 72 lozenge, Rfl: 3   pantoprazole  (PROTONIX ) 40 MG tablet, Take 1 tablet (40 mg total) by mouth daily., Disp: 90 tablet, Rfl: 3   potassium chloride  SA (KLOR-CON  M) 20 MEQ tablet, Take 1 tablet (20 mEq total) by mouth daily. 2 on days with furosemide , Disp: 40 tablet, Rfl: 2   tiZANidine  (ZANAFLEX ) 4 MG tablet,  Take 1 tablet (4 mg total) by mouth 3 (three) times daily., Disp: 90 tablet, Rfl: 1   valsartan -hydrochlorothiazide  (DIOVAN -HCT) 160-25 MG tablet, Take 1 tablet by mouth 2 (two) times daily., Disp: 180 tablet, Rfl: 3   Subjective:   PATIENT ID: Tyrone Mcdowell GENDER: male DOB: May 20, 1956, MRN: [id number]  Chief Complaint  Patient presents with   Lung Mass    DOE. Wheezing. No cough.    HPI  Patient is a pleasant 68 year old male presenting for the evaluation of an abnormal CT scan of the chest.  Patient is in his usual state of health and  only reports shortness of breath with exertion.  He feels short of breath when walking moderate distances.  He did feel very short of breath when he walked from the parking lot to our office today.  He reports an occasional cough, mostly in the morning; this is at times productive of whitish sputum.  He has not had any increase in the amount of sputum or frequency of his cough recently.  His wife does tell him that he has a wheeze at night.  He has not had any recent exacerbations of his breathing.  He was prescribed an albuterol  inhaler as needed by his primary care provider which he uses to good effect.  He was also given a pill (montelukast ) for his symptoms.  Patient has been enrolled in our lung cancer screening program and had low-dose CT of the chest in May 2025.  This CT showed scattered tree-in-bud nodularity and some scattered ground glass opacities for which a repeat CT scan was ordered for July.  Patient has not yet had his repeat CT scan.  He is presenting today to discuss the findings on his CT.  He does recall having respiratory symptoms around the time of the CT scan.  At that time, his primary care physician prescribed him a course of antibiotics for respiratory symptoms.  He has felt better since.  Patient previously worked in Administrator, sports.  She had some exposure to fiberglass dust.  He reports a longstanding history of smoking,  having started at the age of 77.  He currently smokes up to a pack a day.  He probably has between 50 and 60 pack years of smoking history.  He reports some illicit drug use in the 80s.  He does have a brother who had severe asthma.  Ancillary information including prior medications, full medical/surgical/family/social histories, and PFTs (when available) are listed below and have been reviewed.    Review of Systems  Constitutional:  Negative for chills and fever.  Respiratory:  Positive for shortness of breath and wheezing. Negative for cough and hemoptysis.   Cardiovascular:  Negative for chest pain.     Objective:   Vitals:   08/10/24 0853  BP: (!) 110/58  Pulse: 60  Temp: 97.9 F (36.6 C)  SpO2: 100%  Weight: 184 lb 9.6 oz (83.7 kg)  Height: 5' 10 (1.778 m)   100% on RA BMI Readings from Last 3 Encounters:  08/10/24 26.49 kg/m  05/24/24 26.52 kg/m  04/12/24 27.12 kg/m   Wt Readings from Last 3 Encounters:  08/10/24 184 lb 9.6 oz (83.7 kg)  05/24/24 184 lb 12.8 oz (83.8 kg)  04/12/24 189 lb (85.7 kg)    Physical Exam Constitutional:      Appearance: Normal appearance.  Cardiovascular:     Rate and Rhythm: Normal rate and regular rhythm.     Pulses: Normal pulses.     Heart sounds: Normal heart sounds.  Pulmonary:     Effort: Pulmonary effort is normal.     Breath sounds: Normal breath sounds. No wheezing.  Neurological:     General: No focal deficit present.     Mental Status: He is alert and oriented to person, place, and time. Mental status is at baseline.       Ancillary Information    Past Medical History:  Diagnosis Date   Anxiety    takes Valium  as needed   Arthritis    Chronic back pain    spondylolisthesis   Depression  takes Zoloft  daily   Hoarseness or changing voice 05/12/2012   S/p ENT evaluation for throat CA.  Negative.      Hyperlipidemia    takes Atorvastatin  daily   Hypertension    takes Micardis  and AMlodipine  daily    Joint pain    Joint swelling    Peripheral edema    takes Lasix  daily   Urinary frequency    takes Rapaflo  daily   Weakness    numbness and tingling     Family History  Problem Relation Age of Onset   Cancer Mother        lung Ca, tobacco abuse   CAD Mother        MI at age 23   Stroke Father    Hypertension Father    Heart disease Father        pacemaker, heart failure   Kidney disease Father    Ulcerative colitis Father    Cancer Sister 43       colon CA   Cancer Other        colon CA at age 39   Ulcerative colitis Brother      Past Surgical History:  Procedure Laterality Date   BACK SURGERY  04/2016   COLONOSCOPY     COLONOSCOPY WITH PROPOFOL  N/A 06/30/2022   Procedure: COLONOSCOPY WITH PROPOFOL ;  Surgeon: Unk Corinn Skiff, MD;  Location: [facility] ENDOSCOPY;  Service: Gastroenterology;  Laterality: N/A;   COLONOSCOPY WITH PROPOFOL  N/A 09/15/2023   Procedure: COLONOSCOPY WITH PROPOFOL ;  Surgeon: Unk Corinn Skiff, MD;  [facility]: [facility] ENDOSCOPY;  Service: Gastroenterology;  Laterality: N/A;   COLONOSCOPY WITH PROPOFOL  N/A 09/16/2023   Procedure: COLONOSCOPY WITH PROPOFOL ;  Surgeon: Unk Corinn Skiff, MD;  Location: [facility] ENDOSCOPY;  Service: Gastroenterology;  Laterality: N/A;   FLEXIBLE SIGMOIDOSCOPY N/A 07/13/2022   Procedure: FLEXIBLE SIGMOIDOSCOPY;  Surgeon: Unk Corinn Skiff, MD;  Location: [facility];  Service: Endoscopy;  Laterality: N/A;   TIBIA FRACTURE SURGERY Bilateral 1989   multiple surgeries    TIBIA HARDWARE REMOVAL  1989    Social History   Socioeconomic History   Marital status: Married    Spouse name: Not on file   Number of children: Not on file   Years of education: Not on file   Highest education level: 12th grade  Occupational History   Not on file  Tobacco Use   Smoking status: Every Day    Current packs/day: 1.25    Average packs/day: 1.3 packs/day for 46.7 years (58.3 ttl pk-yrs)    Types: Cigarettes    Start date: 1979    Smokeless tobacco: Current    Types: Chew   Tobacco comments:    Smokes 0.55 PPD- khj 08/10/2024        Started smoking at 68 yrs old    Smoke 1 PPD at his heaviest    3 year pack history  Vaping Use   Vaping status: Never Used  Substance and Sexual Activity   Alcohol use: No    Alcohol/week: 0.0 standard drinks of alcohol    Comment: no alcohol in over a yr   Drug use: No   Sexual activity: Not Currently  Other Topics Concern   Not on file  Social History Narrative   Mr. Rengel is married with 2 grown children and 2 grandchildren. He is on disability for back and leg pain.    Social Drivers of Health   Financial Resource Strain: Low Risk  (03/15/2024)   Overall  Financial Resource Strain (CARDIA)    Difficulty of Paying Living Expenses: Not very hard  Food Insecurity: No Food Insecurity (03/15/2024)   Hunger Vital Sign    Worried About Running Out of Food in the Last Year: Never true    Ran Out of Food in the Last Year: Never true  Transportation Needs: No Transportation Needs (03/15/2024)   PRAPARE - Administrator, Civil Service (Medical): No    Lack of Transportation (Non-Medical): No  Physical Activity: Sufficiently Active (03/15/2024)   Exercise Vital Sign    Days of Exercise per Week: 7 days    Minutes of Exercise per Session: 30 min  Stress: No Stress Concern Present (03/15/2024)   [employer] of Occupational Health - Occupational Stress Questionnaire    Feeling of Stress : Not at all  Social Connections: Moderately Isolated (03/15/2024)   Social Connection and Isolation Panel    Frequency of Communication with Friends and Family: More than three times a week    Frequency of Social Gatherings with Friends and Family: Once a week    Attends Religious Services: Never    Database administrator or Organizations: No    Attends Banker Meetings: Never    Marital Status: Married  Catering manager Violence: Not At Risk (03/15/2024)    Humiliation, Afraid, Rape, and Kick questionnaire    Fear of Current or Ex-Partner: No    Emotionally Abused: No    Physically Abused: No    Sexually Abused: No     No Known Allergies   CBC    Component Value Date/Time   WBC 5.1 09/19/2023 1413   RBC 3.94 (L) 09/19/2023 1413   HGB 12.8 (L) 09/19/2023 1413   HGB 14.3 05/03/2012 2101   HCT 38.0 (L) 09/19/2023 1413   HCT 43.5 05/03/2012 2101   PLT 245.0 09/19/2023 1413   PLT 251 05/03/2012 2101   MCV 96.4 09/19/2023 1413   MCV 94 05/03/2012 2101   MCH 31.7 05/08/2023 1811   MCHC 33.6 09/19/2023 1413   RDW 13.8 09/19/2023 1413   RDW 13.6 05/03/2012 2101   LYMPHSABS 1.7 09/19/2023 1413   MONOABS 0.6 09/19/2023 1413   EOSABS 0.2 09/19/2023 1413   BASOSABS 0.0 09/19/2023 1413    Pulmonary Functions Testing Results:     No data to display          Outpatient Medications Prior to Visit  Medication Sig Dispense Refill   albuterol  (VENTOLIN  HFA) 108 (90 Base) MCG/ACT inhaler Inhale 2 puffs into the lungs every 6 (six) hours as needed for wheezing or shortness of breath. 8.5 g 11   ALPRAZolam  (XANAX ) 0.25 MG tablet Take 1 tablet (0.25 mg total) by mouth at bedtime as needed for anxiety. 30 tablet 2   amLODipine  (NORVASC ) 10 MG tablet Take 1 tablet (10 mg total) by mouth daily. 90 tablet 1   atorvastatin  (LIPITOR) 20 MG tablet Take 1 tablet (20 mg total) by mouth daily at 6 PM. 90 tablet 1   cloNIDine  (CATAPRES ) 0.1 MG tablet Take 1 tablet (0.1 mg total) by mouth 2 (two) times daily. 180 tablet 3   clopidogrel  (PLAVIX ) 75 MG tablet Take 1 tablet (75 mg total) by mouth daily. 90 tablet 1   DULoxetine  (CYMBALTA ) 30 MG capsule Take 3 capsules (90 mg total) by mouth daily in divided doses 90 capsule 11   furosemide  (LASIX ) 20 MG tablet Take 1 tablet (20 mg total) by mouth daily as needed. 90  tablet 2   gabapentin  (NEURONTIN ) 300 MG capsule Take 2 capsules (600 mg total) by mouth 3 (three) times daily. 180 capsule 0    HYDROcodone -acetaminophen  (NORCO) 10-325 MG tablet Take 1 tablet by mouth 3 (three) times daily as needed for up to 90 doses. 90 tablet 0   montelukast  (SINGULAIR ) 10 MG tablet Take 1 tablet (10 mg total) by mouth at bedtime. 90 tablet 3   pantoprazole  (PROTONIX ) 40 MG tablet Take 1 tablet (40 mg total) by mouth daily. 90 tablet 3   potassium chloride  SA (KLOR-CON  M) 20 MEQ tablet Take 1 tablet (20 mEq total) by mouth daily. 2 on days with furosemide  40 tablet 2   tiZANidine  (ZANAFLEX ) 4 MG tablet Take 1 tablet (4 mg total) by mouth 3 (three) times daily. 90 tablet 1   valsartan -hydrochlorothiazide  (DIOVAN -HCT) 160-25 MG tablet Take 1 tablet by mouth 2 (two) times daily. 180 tablet 3   No facility-administered medications prior to visit.

## 2024-08-10 NOTE — Patient Instructions (Signed)
 The [place]  Quitline: Call 1-800-QUIT-NOW (682 840 6469). The [place] Quitline is a free service for [place]  residents. Trained counselors are available from 8 am until 3 am, 365 days per year. Services are available in both [place] and [place].   Web Resources Free online support programs can help you track your progress and share experiences with others who are quitting. These are examples: www.becomeanex.org www.trytostop.org  www.smokefree.gov  www.https://www.vargas.com/.aspx  [facility]: offers comprehensive in-person tobacco treatment counseling at [facility] building ([address], [place] [address]).  Open to everyone. Virtual appointments available. Free parking. Call [phone] to schedule an appointment or [phone] for general information.    Tobacco Cessation Medications  Nicotine  Replacement Therapy (NRT)  Nicotine  is the addictive part of tobacco smoke, but not the most dangerous part. There are 7000 other toxins in cigarettes, including carbon monoxide, that cause disease. People do not generally become addicted to medication. Common problems: People don't use enough medication or stop too early. Medications are safe and effective. Overdose is very uncommon. Use medications as long as needed (3 months minimum). Some combinations work better than single medications. Long acting medications like the NRT patch and bupropion provide continuous treatment for withdrawal symptoms.  PLUS  Short acting medications like the NRT gum, lozenge, inhaler, and nasal spray help people to cope with breakthrough cravings.  ? Nicotine  Patch  Place patch on hairless skin on upper body, including arms and back. Each day: discard old patch, shower, apply new patch to a different site. Apply hydrocortisone cream to mildly red/irritated areas. Call provider if rash develops. If patch causes sleep disturbance, remove patch  at bedtime and replace each morning after shower. Side effects may include: skin irritation, headache, insomnia, abnormal/vivid dreams.  ? Nicotine  Gum  Chew gum slowly, park in cheek when peppery taste or tingling sensation begins (about 15-30 chews). When taste or tingling goes away, begin chewing again. Use until nicotine  is gone (taste or tingle does not return, usually 30 minutes). Park in different areas of mouth. Nicotine  is absorbed through the lining of the mouth. Use enough to control cravings, up to 24 pieces per day (if used alone). Avoid eating or drinking for 15 minutes before using and during use. Side effects may include: mouth/jaw soreness, hiccups, indigestion, hypersalivation.  If gum is not chewed correctly, additional side effects may include lightheadedness, nausea/vomiting, throat and mouth irritation.  ? Nicotine  Lozenge  Allow to dissolve slowly in mouth (20-30 minutes). Do not chew or swallow. Nicotine  release may cause a warm tingling sensation. Occasionally rotate to different areas of the mouth. Use enough to control cravings, up to 20 lozenges per day (if used alone). Avoid eating or drinking for 15 minutes before using and during use. Side effects may include: nausea, hiccups, cough, heartburn, headache, gas, insomnia.  ? Nicotine  Nasal Spray Use 1 spray in each nostril (1 dose) and tilt head back for 1 minute. Do not sniff, swallow, or inhale through nose.  Use at least 8 doses (1 spray in each nostril) , up to 40 doses per day (if used alone). To reduce nasal irritation, spray on cotton swab and insert into nose. Side effects may include: nasal and/or throat irritation (hot, peppery, or burning sensation), nasal irritation, tearing, sneezing, cough, headache.  ? Nicotine  Oral Inhaler (puffer) Inhale into the back of the throat or puff in short breaths. Do not inhale into the lungs.  Puff continuously for 20 minutes (about 80 puffs) until cartridge  is  empty. Change cartridge when it loses the "burning in throat" sensation (feels like air only). Open cartridges can be saved and used again within 24 hours. Use at least 6 and up to 16 cartridges per day (if used alone).  Avoid eating or drinking for 15 minutes before using and during use. Side effects may include: mouth and/or throat irritation, unpleasant taste, cough, nasal irritation, indigestion, hiccups, headache.  ? Chantix  (varenicline ) Days 1-3: Take one 0.5 mg white pill each morning for 3 days, one week before quit date. Days 4-7: Increase to one 0.5 mg white pill twice a day in morning and evening for 4 days.  On Day 8 (target quit date), increase to one 1 mg blue pill twice a day. Maintain this dose for a minimum of 3 months. Take with food and a full glass of water to reduce nausea. Be sure that the two doses are at least 8 hours apart, but try to take second dose early in the evening (i.e. 6 pm) to avoid sleep problems. Common side effects include: nausea, insomnia, headache, abnormal/vivid dreams. Tell your doctor if you have any history of psychiatric illness prior to starting Chantix .  STOP taking CHANTIX  and contact a healthcare provider immediately if you experience agitation, hostility, depressed mood, changes in thoughts or behavior that are not typical for you, thinking about or attempting suicide, allergic or skin reactions including swelling, rash, redness, or peeling of the skin.  For patients who have heart disease: Smoking is a major risk factor for cardiovascular disease, and Chantix  can help you quit smoking. Chantix  may be associated with a small, increased risk of certain heart events in patients who have heart disease. If you have any new or worsening symptoms of heart disease while taking Chantix , such as shortness of breath or trouble breathing, new or worsening chest pain, or new or worsening pain in your legs when walking, call your doctor or get emergency medical  help immediately.  ? Wellbutrin / Zyban (bupropion) Take one 150 mg pill each morning for 3 days, one week before target quit date. On Day 4, increase to one 150 mg pill twice a day, morning and evening.  Maintain this dose for a minimum of 3 months. Be sure that the two doses are at least 8 hours apart, but try to take second dose early in the evening (i.e. 6 pm) to avoid sleep problems. Avoid or minimize use of alcohol when taking this medication. Common side effects include: dry mouth, headache, insomnia, nausea, weight loss.  Risk of seizure is 12/998. STOP taking BUPROPION and contact a healthcare provider immediately if you experience agitation, hostility, depressed mood, changes in thoughts or behavior that are not typical for you, thinking about or attempting suicide, allergic or skin reactions including swelling, rash, redness, or peeling of the skin.

## 2024-08-14 ENCOUNTER — Other Ambulatory Visit: Payer: Self-pay

## 2024-08-21 ENCOUNTER — Other Ambulatory Visit: Payer: Self-pay

## 2024-08-21 ENCOUNTER — Other Ambulatory Visit: Payer: Self-pay | Admitting: Internal Medicine

## 2024-08-21 MED FILL — Alprazolam Tab 0.25 MG: ORAL | 30 days supply | Qty: 30 | Fill #1 | Status: AC

## 2024-08-22 ENCOUNTER — Other Ambulatory Visit: Payer: Self-pay

## 2024-08-22 MED ORDER — HYDROCODONE-ACETAMINOPHEN 10-325 MG PO TABS
1.0000 | ORAL_TABLET | Freq: Three times a day (TID) | ORAL | 0 refills | Status: DC | PRN
  Filled 2024-08-22: qty 90, 30d supply, fill #0

## 2024-08-23 ENCOUNTER — Other Ambulatory Visit: Payer: Self-pay | Admitting: Internal Medicine

## 2024-08-23 MED FILL — Potassium Chloride Microencapsulated Crys ER Tab 20 mEq: ORAL | 40 days supply | Qty: 40 | Fill #2 | Status: AC

## 2024-08-24 ENCOUNTER — Other Ambulatory Visit: Payer: Self-pay

## 2024-08-24 MED ORDER — CLOPIDOGREL BISULFATE 75 MG PO TABS
75.0000 mg | ORAL_TABLET | Freq: Every day | ORAL | 1 refills | Status: DC
  Filled 2024-08-24: qty 90, 90d supply, fill #0
  Filled 2024-11-29: qty 90, 90d supply, fill #1

## 2024-08-28 ENCOUNTER — Ambulatory Visit (INDEPENDENT_AMBULATORY_CARE_PROVIDER_SITE_OTHER): Admitting: Internal Medicine

## 2024-08-28 ENCOUNTER — Telehealth: Payer: Self-pay

## 2024-08-28 ENCOUNTER — Encounter: Payer: Self-pay | Admitting: Internal Medicine

## 2024-08-28 ENCOUNTER — Other Ambulatory Visit: Payer: Self-pay

## 2024-08-28 VITALS — BP 134/82 | HR 55 | Ht 70.0 in | Wt 191.2 lb

## 2024-08-28 DIAGNOSIS — E785 Hyperlipidemia, unspecified: Secondary | ICD-10-CM | POA: Diagnosis not present

## 2024-08-28 DIAGNOSIS — J432 Centrilobular emphysema: Secondary | ICD-10-CM | POA: Diagnosis not present

## 2024-08-28 DIAGNOSIS — I10 Essential (primary) hypertension: Secondary | ICD-10-CM

## 2024-08-28 DIAGNOSIS — Z1211 Encounter for screening for malignant neoplasm of colon: Secondary | ICD-10-CM | POA: Diagnosis not present

## 2024-08-28 DIAGNOSIS — K760 Fatty (change of) liver, not elsewhere classified: Secondary | ICD-10-CM

## 2024-08-28 DIAGNOSIS — Z720 Tobacco use: Secondary | ICD-10-CM | POA: Diagnosis not present

## 2024-08-28 DIAGNOSIS — F411 Generalized anxiety disorder: Secondary | ICD-10-CM | POA: Diagnosis not present

## 2024-08-28 DIAGNOSIS — R7301 Impaired fasting glucose: Secondary | ICD-10-CM | POA: Diagnosis not present

## 2024-08-28 DIAGNOSIS — M4317 Spondylolisthesis, lumbosacral region: Secondary | ICD-10-CM

## 2024-08-28 DIAGNOSIS — Z79899 Other long term (current) drug therapy: Secondary | ICD-10-CM | POA: Diagnosis not present

## 2024-08-28 DIAGNOSIS — D649 Anemia, unspecified: Secondary | ICD-10-CM

## 2024-08-28 DIAGNOSIS — Z8673 Personal history of transient ischemic attack (TIA), and cerebral infarction without residual deficits: Secondary | ICD-10-CM | POA: Diagnosis not present

## 2024-08-28 LAB — CBC WITH DIFFERENTIAL/PLATELET
Basophils Absolute: 0 K/uL (ref 0.0–0.1)
Basophils Relative: 0.6 % (ref 0.0–3.0)
Eosinophils Absolute: 0.1 K/uL (ref 0.0–0.7)
Eosinophils Relative: 2.3 % (ref 0.0–5.0)
HCT: 36.6 % — ABNORMAL LOW (ref 39.0–52.0)
Hemoglobin: 12.2 g/dL — ABNORMAL LOW (ref 13.0–17.0)
Lymphocytes Relative: 22.9 % (ref 12.0–46.0)
Lymphs Abs: 1 K/uL (ref 0.7–4.0)
MCHC: 33.3 g/dL (ref 30.0–36.0)
MCV: 95.9 fl (ref 78.0–100.0)
Monocytes Absolute: 0.5 K/uL (ref 0.1–1.0)
Monocytes Relative: 11.3 % (ref 3.0–12.0)
Neutro Abs: 2.8 K/uL (ref 1.4–7.7)
Neutrophils Relative %: 62.9 % (ref 43.0–77.0)
Platelets: 225 K/uL (ref 150.0–400.0)
RBC: 3.82 Mil/uL — ABNORMAL LOW (ref 4.22–5.81)
RDW: 13.6 % (ref 11.5–15.5)
WBC: 4.5 K/uL (ref 4.0–10.5)

## 2024-08-28 LAB — COMPREHENSIVE METABOLIC PANEL WITH GFR
ALT: 30 U/L (ref 0–53)
AST: 20 U/L (ref 0–37)
Albumin: 3.9 g/dL (ref 3.5–5.2)
Alkaline Phosphatase: 127 U/L — ABNORMAL HIGH (ref 39–117)
BUN: 14 mg/dL (ref 6–23)
CO2: 39 meq/L — ABNORMAL HIGH (ref 19–32)
Calcium: 9.7 mg/dL (ref 8.4–10.5)
Chloride: 97 meq/L (ref 96–112)
Creatinine, Ser: 0.91 mg/dL (ref 0.40–1.50)
GFR: 88.19 mL/min (ref >60.00)
Glucose, Bld: 88 mg/dL (ref 70–99)
Potassium: 3.7 meq/L (ref 3.5–5.1)
Sodium: 142 meq/L (ref 135–145)
Total Bilirubin: 0.5 mg/dL (ref 0.2–1.2)
Total Protein: 6 g/dL (ref 6.0–8.3)

## 2024-08-28 MED ORDER — GABAPENTIN 300 MG PO CAPS
600.0000 mg | ORAL_CAPSULE | Freq: Three times a day (TID) | ORAL | 0 refills | Status: DC
  Filled 2024-08-28 – 2024-09-13 (×2): qty 180, 30d supply, fill #0

## 2024-08-28 MED ORDER — POTASSIUM CHLORIDE CRYS ER 20 MEQ PO TBCR
20.0000 meq | EXTENDED_RELEASE_TABLET | Freq: Every day | ORAL | 2 refills | Status: AC
  Filled 2024-10-06: qty 40, 40d supply, fill #0
  Filled 2024-11-18: qty 40, 40d supply, fill #1
  Filled 2024-12-28: qty 40, 40d supply, fill #2

## 2024-08-28 MED ORDER — HYDROCODONE-ACETAMINOPHEN 10-325 MG PO TABS
1.0000 | ORAL_TABLET | Freq: Three times a day (TID) | ORAL | 0 refills | Status: AC | PRN
  Filled 2024-08-28 – 2024-11-18 (×2): qty 90, 30d supply, fill #0

## 2024-08-28 MED ORDER — TIZANIDINE HCL 4 MG PO TABS
4.0000 mg | ORAL_TABLET | Freq: Three times a day (TID) | ORAL | 1 refills | Status: AC
  Filled 2024-08-28: qty 90, 30d supply, fill #0
  Filled 2024-10-06: qty 90, 30d supply, fill #1

## 2024-08-28 MED ORDER — HYDROCODONE-ACETAMINOPHEN 10-325 MG PO TABS
1.0000 | ORAL_TABLET | Freq: Three times a day (TID) | ORAL | 0 refills | Status: DC | PRN
  Filled 2024-08-28 – 2024-10-19 (×2): qty 90, 30d supply, fill #0

## 2024-08-28 MED ORDER — HYDROCODONE-ACETAMINOPHEN 10-325 MG PO TABS
1.0000 | ORAL_TABLET | Freq: Three times a day (TID) | ORAL | 0 refills | Status: DC | PRN
  Filled 2024-08-28 – 2024-09-21 (×2): qty 90, 30d supply, fill #0

## 2024-08-28 NOTE — Progress Notes (Unsigned)
 Subjective:  Patient ID: Tyrone Mcdowell, male    DOB: December 22, 1955  Age: 68 y.o. MRN: [id number]  CC: The primary encounter diagnosis was Colon cancer screening. Diagnoses of Primary hypertension, Hyperlipidemia LDL goal <100, Impaired fasting glucose, and Long-term use of high-risk medication were also pertinent to this visit.   HPI Tyrone Mcdowell presents for  Chief Complaint  Patient presents with   Medical Management of Chronic Issues    3 month follow up on chronic pain management    1) centrilobular emphysema :  now seeing pulmonary, ([facility] Sept 2025) ,  a repeat CT chest ordered for oct 20.  Has quit smoking  tired using nictoien patches but has not stopped using patches,  eating more    2)   Outpatient Medications Prior to Visit  Medication Sig Dispense Refill   albuterol  (VENTOLIN  HFA) 108 (90 Base) MCG/ACT inhaler Inhale 2 puffs into the lungs every 6 (six) hours as needed for wheezing or shortness of breath. 8.5 g 11   ALPRAZolam  (XANAX ) 0.25 MG tablet Take 1 tablet (0.25 mg total) by mouth at bedtime as needed for anxiety. 30 tablet 2   amLODipine  (NORVASC ) 10 MG tablet Take 1 tablet (10 mg total) by mouth daily. 90 tablet 1   atorvastatin  (LIPITOR) 20 MG tablet Take 1 tablet (20 mg total) by mouth daily at 6 PM. 90 tablet 1   cloNIDine  (CATAPRES ) 0.1 MG tablet Take 1 tablet (0.1 mg total) by mouth 2 (two) times daily. 180 tablet 3   clopidogrel  (PLAVIX ) 75 MG tablet Take 1 tablet (75 mg total) by mouth daily. 90 tablet 1   DULoxetine  (CYMBALTA ) 30 MG capsule Take 3 capsules (90 mg total) by mouth daily in divided doses 90 capsule 11   furosemide  (LASIX ) 20 MG tablet Take 1 tablet (20 mg total) by mouth daily as needed. 90 tablet 2   gabapentin  (NEURONTIN ) 300 MG capsule Take 2 capsules (600 mg total) by mouth 3 (three) times daily. 180 capsule 0   HYDROcodone -acetaminophen  (NORCO) 10-325 MG tablet Take 1 tablet by mouth 3 (three) times daily as needed for up to 90  doses. 90 tablet 0   montelukast  (SINGULAIR ) 10 MG tablet Take 1 tablet (10 mg total) by mouth at bedtime. 90 tablet 3   nicotine  (NICODERM CQ  - DOSED IN MG/24 HOURS) 21 mg/24hr patch Place 1 patch (21 mg total) onto the skin daily. 42 patch 0   nicotine  polacrilex (NICOTINE  MINI) 2 MG lozenge Take 1 lozenge (2 mg total) by mouth every 2 (two) hours as needed for smoking cessation. 72 lozenge 3   pantoprazole  (PROTONIX ) 40 MG tablet Take 1 tablet (40 mg total) by mouth daily. 90 tablet 3   potassium chloride  SA (KLOR-CON  M) 20 MEQ tablet Take 1 tablet (20 mEq total) by mouth daily. 2 on days with furosemide  40 tablet 2   tiZANidine  (ZANAFLEX ) 4 MG tablet Take 1 tablet (4 mg total) by mouth 3 (three) times daily. 90 tablet 1   valsartan -hydrochlorothiazide  (DIOVAN -HCT) 160-25 MG tablet Take 1 tablet by mouth 2 (two) times daily. 180 tablet 3   No facility-administered medications prior to visit.    Review of Systems;  Patient denies headache, fevers, malaise, unintentional weight loss, skin rash, eye pain, sinus congestion and sinus pain, sore throat, dysphagia,  hemoptysis , cough, dyspnea, wheezing, chest pain, palpitations, orthopnea, edema, abdominal pain, nausea, melena, diarrhea, constipation, flank pain, dysuria, hematuria, urinary  Frequency, nocturia, numbness, tingling, seizures,  Focal  weakness, Loss of consciousness,  Tremor, insomnia, depression, anxiety, and suicidal ideation.      Objective:  BP (!) 158/94   Pulse (!) 55   Ht 5' 10 (1.778 m)   Wt 191 lb 3.2 oz (86.7 kg)   SpO2 98%   BMI 27.43 kg/m   BP Readings from Last 3 Encounters:  08/28/24 (!) 158/94  08/10/24 (!) 110/58  05/24/24 128/74    Wt Readings from Last 3 Encounters:  08/28/24 191 lb 3.2 oz (86.7 kg)  08/10/24 184 lb 9.6 oz (83.7 kg)  05/24/24 184 lb 12.8 oz (83.8 kg)    Physical Exam  Lab Results  Component Value Date   HGBA1C 5.2 09/19/2023   HGBA1C 5.3 01/17/2023   HGBA1C 5.6 01/04/2022     Lab Results  Component Value Date   CREATININE 0.70 04/19/2024   CREATININE 0.76 03/15/2024   CREATININE 0.91 12/21/2023    Lab Results  Component Value Date   WBC 5.1 09/19/2023   HGB 12.8 (L) 09/19/2023   HCT 38.0 (L) 09/19/2023   PLT 245.0 09/19/2023   GLUCOSE 96 04/19/2024   CHOL 165 12/21/2023   TRIG 124.0 12/21/2023   HDL 62.50 12/21/2023   LDLDIRECT 66.0 09/19/2023   LDLCALC 77 12/21/2023   ALT 21 04/19/2024   AST 18 04/19/2024   NA 133 (L) 04/19/2024   K 4.1 04/19/2024   CL 93 (L) 04/19/2024   CREATININE 0.70 04/19/2024   BUN 12 04/19/2024   CO2 31 04/19/2024   TSH 1.79 09/19/2023   PSA 0.15 01/17/2023   INR 1.07 05/08/2018   HGBA1C 5.2 09/19/2023   MICROALBUR 1.2 03/15/2024    MR 3D Recon At Scanner Result Date: 05/21/2024 CLINICAL DATA:  Dilated common duct. Elevated liver enzymes. Pancreatic lesion. EXAM: MRI ABDOMEN WITHOUT AND WITH CONTRAST TECHNIQUE: Multiplanar multisequence MR imaging of the abdomen was performed both before and after the administration of intravenous contrast. CONTRAST:  10mL GADAVIST  GADOBUTROL  1 MMOL/ML IV SOLN COMPARISON:  CT 04/12/2024.  Ultrasound 03/23/2024. FINDINGS: Lower chest: Since the prior CT scan there is developed significant parenchymal opacities identified in the middle lobe with some potential volume loss versus a consolidative area. Tree-in-bud like nodularity again identified as well in the right lower lobe. Recommend further evaluation such is follow-up CT scan or chest x-ray. Hepatobiliary: Patent portal vein. Liver has a few scattered bright T2, low T1 nonenhancing foci consistent with benign cystic foci. Example segment 7 measures 6 mm. No specific imaging follow-up. No signal dropout on out of phase imaging in the liver. Gallbladder is distended. No filling defect. No abnormal wall enhancement. There is diffuse moderate intrahepatic biliary ductal dilatation. Extrahepatic common duct is also ectatic. Proximal diameter  of 13 mm as previously described. Duct does taper at the pancreatic head. No clear filling defect. No abnormal wall enhancement. No restricted diffusion. Pancreas: Pancreas is with slight signal loss on T1 precontrast. Please correlate for any history of previous episodes of pancreatitis. No restricted diffusion along the pancreas. No enhancing mass. Slight ectasia of the pancreatic duct with maximal diameter towards the head/neck at 4 mm. No abrupt caliber change. No dilatation towards the tail. Spleen:  Spleen is nonenlarged.  Preserved enhancement and signal. Adrenals/Urinary Tract: No enhancing renal mass or collecting system dilatation. Preserved adrenal glands. Stomach/Bowel: Visualized portions of the small large bowel in the abdomen are nondilated. Stomach is nondilated. Vascular/Lymphatic: Atherosclerotic changes along the aorta. Normal caliber aorta and IVC. Other: No free fluid. Motion artifact  throughout the examination limiting evaluation. Musculoskeletal: Degenerative changes along the spine. Susceptibility artifact related to the fixation hardware L at the L5-S1 level. IMPRESSION: No pancreatic mass identified. There is some atrophy as well as some signal changes. Please correlate for any history of previous episodes of pancreatitis. Mild ectasia of the pancreatic duct towards the head neck region. Moderate dilatation of the biliary tree. No clear filling defect, abnormal wall enhancement or thickening. Etiology is uncertain. In principle a subtle ampullary lesion is difficult to completely exclude. Additional workup as clinically appropriate Evaluation limited by motion. Significant opacity identified in the middle lobe with either volume loss or infiltrate. This is new from a CT scan of 04/12/2024. The tree-in-bud like nodular opacities identified in the right lower lobe are also potentially progressive. Please correlate with clinical presentation recommend further evaluation. Electronically Signed    By: Ranell Bring M.D.   On: 05/21/2024 08:58   MR Abdomen W Wo Contrast Result Date: 04/27/2024 CLINICAL DATA:  Dilated common duct. Elevated liver enzymes. Pancreatic lesion. EXAM: MRI ABDOMEN WITHOUT AND WITH CONTRAST TECHNIQUE: Multiplanar multisequence MR imaging of the abdomen was performed both before and after the administration of intravenous contrast. CONTRAST:  10mL GADAVIST  GADOBUTROL  1 MMOL/ML IV SOLN COMPARISON:  CT 04/12/2024.  Ultrasound 03/23/2024. FINDINGS: Lower chest: Since the prior CT scan there is developed significant parenchymal opacities identified in the middle lobe with some potential volume loss versus a consolidative area. Tree-in-bud like nodularity again identified as well in the right lower lobe. Recommend further evaluation such is follow-up CT scan or chest x-ray. Hepatobiliary: Patent portal vein. Liver has a few scattered bright T2, low T1 nonenhancing foci consistent with benign cystic foci. Example segment 7 measures 6 mm. No specific imaging follow-up. No signal dropout on out of phase imaging in the liver. Gallbladder is distended. No filling defect. No abnormal wall enhancement. There is diffuse moderate intrahepatic biliary ductal dilatation. Extrahepatic common duct is also ectatic. Proximal diameter of 13 mm as previously described. Duct does taper at the pancreatic head. No clear filling defect. No abnormal wall enhancement. No restricted diffusion. Pancreas: Pancreas is with slight signal loss on T1 precontrast. Please correlate for any history of previous episodes of pancreatitis. No restricted diffusion along the pancreas. No enhancing mass. Slight ectasia of the pancreatic duct with maximal diameter towards the head/neck at 4 mm. No abrupt caliber change. No dilatation towards the tail. Spleen:  Spleen is nonenlarged.  Preserved enhancement and signal. Adrenals/Urinary Tract: No enhancing renal mass or collecting system dilatation. Preserved adrenal glands.  Stomach/Bowel: Visualized portions of the small large bowel in the abdomen are nondilated. Stomach is nondilated. Vascular/Lymphatic: Atherosclerotic changes along the aorta. Normal caliber aorta and IVC. Other: No free fluid. Motion artifact throughout the examination limiting evaluation. Musculoskeletal: Degenerative changes along the spine. Susceptibility artifact related to the fixation hardware L at the L5-S1 level. IMPRESSION: No pancreatic mass identified. There is some atrophy as well as some signal changes. Please correlate for any history of previous episodes of pancreatitis. Mild ectasia of the pancreatic duct towards the head neck region. Moderate dilatation of the biliary tree. No clear filling defect, abnormal wall enhancement or thickening. Etiology is uncertain. In principle a subtle ampullary lesion is difficult to completely exclude. Additional workup as clinically appropriate Evaluation limited by motion. Significant opacity identified in the middle lobe with either volume loss or infiltrate. This is new from a CT scan of 04/12/2024. The tree-in-bud like nodular opacities identified in the right  lower lobe are also potentially progressive. Please correlate with clinical presentation recommend further evaluation. Electronically Signed   By: Ranell Bring M.D.   On: 04/27/2024 17:32    Assessment & Plan:  .Colon cancer screening  Primary hypertension  Hyperlipidemia LDL goal <100  Impaired fasting glucose  Long-term use of high-risk medication     I spent 34 minutes on the day of this face to face encounter reviewing patient's  most recent visit with cardiology,  nephrology,  and neurology,  prior relevant surgical and non surgical procedures, recent  labs and imaging studies, counseling on weight management,  reviewing the assessment and plan with patient, and post visit ordering and reviewing of  diagnostics and therapeutics with patient  .   Follow-up: No follow-ups on  file.   Verneita LITTIE Kettering, MD

## 2024-08-28 NOTE — Patient Instructions (Addendum)
 I 'm so glad you quit smoking .     Use the Trelegy ONCE DAILY FOR 14 DAYS.  DO NOT USE FOR 24 HOURS PRIOR TO THE PULMONARY FUNCTION TESTS .  (IF THE TEST IS ON WEDNESDAY  ,  LAST USE SHOULD BE ON MONDAY)    YOU CAN ALSO USE MUCINEX TO HELP BREAK UP THE MUCUS   I strongly recommend tha you get the RSV vaccine as well as the influenza vaccine

## 2024-08-28 NOTE — Telephone Encounter (Signed)
 Medication Samples have been provided to the patient.  Drug name: Trelegy Elipta        Strength: 100 mcg/ 62.5 mcg/ 25 mcg      Qty: 1 box  LOT: [id number]  Exp.Date: 04/04/2025  Dosing instructions: Inhale 1 puff into lungs one time daily.   The patient has been instructed regarding the correct time, dose, and frequency of taking this medication, including desired effects and most common side effects.   Marcio Hoque 10:28 AM 08/28/2024

## 2024-08-29 ENCOUNTER — Ambulatory Visit: Payer: Self-pay | Admitting: Internal Medicine

## 2024-08-29 NOTE — Assessment & Plan Note (Signed)
 Workup July 2019 .  Continue BP control,  Plavix , asa and statin

## 2024-08-29 NOTE — Addendum Note (Signed)
 Addended by: MARYLYNN VERNEITA CROME on: 08/29/2024 09:38 PM   Modules accepted: Orders

## 2024-08-29 NOTE — Assessment & Plan Note (Signed)
 He is taking cymbalta  for pain management but may be benefitting in other ways from the medication He is using  alprazolam   less than once daily to avoid conflict at home turning into shouting matches.    Refills given . The risks and benefits of benzodiazepine use were reviewed with patient today including excessive sedation leading to respiratory depression,  impaired thinking/driving, and addiction.  Patient was advised to avoid concurrent use with alcohol, to use medication only as needed and not to share with others  .

## 2024-08-29 NOTE — Assessment & Plan Note (Addendum)
 Now goal on maximal doses of amlodipine , valsartan /hct and  clonidine  twice daily 0.1 mg  .

## 2024-08-29 NOTE — Assessment & Plan Note (Signed)
 He has been abstinent since his initial evlaution by Dr Philipp.  And is no longer using transdermal nicotine 

## 2024-08-29 NOTE — Assessment & Plan Note (Signed)
 He is tolerating atorvastatin  .  Lab Results  Component Value Date   ALT 30 08/28/2024   AST 20 08/28/2024   ALKPHOS 127 (H) 08/28/2024   BILITOT 0.5 08/28/2024   Lab Results  Component Value Date   CHOL 165 12/21/2023   HDL 62.50 12/21/2023   LDLCALC 77 12/21/2023   LDLDIRECT 66.0 09/19/2023   TRIG 124.0 12/21/2023   CHOLHDL 3 12/21/2023

## 2024-08-29 NOTE — Assessment & Plan Note (Signed)
 Elevation of alk phos is resolving with dc/ NSAID

## 2024-08-29 NOTE — Assessment & Plan Note (Signed)
 He reports persistent pain despite surgical decompression by  Victory Gunnels n 2017  and periodic ESI.  NSAIDs are C/I due to fatty liver with persistent Transaminase elevation.  Refill history confirmed via  Controlled Substance databas, accessed by me today.SABRA Refills given for hydrocodone  for 3 months .

## 2024-08-29 NOTE — Assessment & Plan Note (Signed)
 Samples of Trelegy given today for use during period of increased sputum production following tobacco cessation.  Advised to suspend therapy 48 hours prior to PFTS

## 2024-08-30 NOTE — Telephone Encounter (Signed)
 Message relayed to the patient, he states he has not schedule his colonoscopy just yet but is going to. He scheduled a lab appt for 08/31/2024

## 2024-08-31 ENCOUNTER — Other Ambulatory Visit (INDEPENDENT_AMBULATORY_CARE_PROVIDER_SITE_OTHER)

## 2024-08-31 DIAGNOSIS — D649 Anemia, unspecified: Secondary | ICD-10-CM

## 2024-08-31 NOTE — Addendum Note (Signed)
 Addended by: TANDA HARVEY D on: 08/31/2024 07:32 AM   Modules accepted: Orders

## 2024-08-31 NOTE — Addendum Note (Signed)
 Addended by: TANDA HARVEY D on: 08/31/2024 07:33 AM   Modules accepted: Orders

## 2024-09-03 ENCOUNTER — Encounter: Payer: Self-pay | Admitting: Internal Medicine

## 2024-09-04 LAB — IFE AND PE, RANDOM URINE
% BETA, Urine: 0 %
ALBUMIN, U: 100 %
ALPHA 1 URINE: 0 %
ALPHA-2-GLOBULIN, U: 0 %
GAMMA GLOBULIN URINE: 0 %
Protein, Ur: 4.7 mg/dL

## 2024-09-05 LAB — PROTEIN ELECTROPHORESIS, SERUM
A/G Ratio: 1.2 (ref 0.7–1.7)
Albumin ELP: 3.3 g/dL (ref 2.9–4.4)
Alpha 1: 0.3 g/dL (ref 0.0–0.4)
Alpha 2: 0.8 g/dL (ref 0.4–1.0)
Beta: 1 g/dL (ref 0.7–1.3)
Gamma Globulin: 0.6 g/dL (ref 0.4–1.8)
Globulin, Total: 2.8 g/dL (ref 2.2–3.9)
M-Spike, %: 0.2 g/dL — AB
Total Protein: 6.1 g/dL (ref 6.0–8.5)

## 2024-09-05 LAB — IRON,TIBC AND FERRITIN PANEL
Ferritin: 126 ng/mL (ref 30–400)
Iron Saturation: 17 % (ref 15–55)
Iron: 48 ug/dL (ref 38–169)
Total Iron Binding Capacity: 289 ug/dL (ref 250–450)
UIBC: 241 ug/dL (ref 111–343)

## 2024-09-05 LAB — B12 AND FOLATE PANEL
Folate: 3.9 ng/mL (ref >3.0)
Vitamin B-12: 945 pg/mL (ref 232–1245)

## 2024-09-13 MED FILL — Alprazolam Tab 0.25 MG: ORAL | 30 days supply | Qty: 30 | Fill #2 | Status: CN

## 2024-09-14 ENCOUNTER — Other Ambulatory Visit: Payer: Self-pay

## 2024-09-14 MED FILL — Alprazolam Tab 0.25 MG: ORAL | 30 days supply | Qty: 30 | Fill #2 | Status: CN

## 2024-09-21 ENCOUNTER — Other Ambulatory Visit: Payer: Self-pay

## 2024-09-21 MED FILL — Alprazolam Tab 0.25 MG: ORAL | 30 days supply | Qty: 30 | Fill #2 | Status: AC

## 2024-09-24 ENCOUNTER — Ambulatory Visit
Admission: RE | Admit: 2024-09-24 | Discharge: 2024-09-24 | Disposition: A | Discharge status: Home / Self Care | Source: Ambulatory Visit | Attending: Student in an Organized Health Care Education/Training Program | Admitting: Student in an Organized Health Care Education/Training Program

## 2024-09-24 DIAGNOSIS — E041 Nontoxic single thyroid nodule: Secondary | ICD-10-CM | POA: Diagnosis not present

## 2024-09-24 DIAGNOSIS — R911 Solitary pulmonary nodule: Secondary | ICD-10-CM | POA: Insufficient documentation

## 2024-09-24 DIAGNOSIS — R918 Other nonspecific abnormal finding of lung field: Secondary | ICD-10-CM | POA: Diagnosis not present

## 2024-09-24 DIAGNOSIS — I700 Atherosclerosis of aorta: Secondary | ICD-10-CM | POA: Diagnosis not present

## 2024-09-24 DIAGNOSIS — R0602 Shortness of breath: Secondary | ICD-10-CM | POA: Insufficient documentation

## 2024-09-27 ENCOUNTER — Ambulatory Visit: Payer: Self-pay | Admitting: Student in an Organized Health Care Education/Training Program

## 2024-10-06 ENCOUNTER — Other Ambulatory Visit: Payer: Self-pay

## 2024-10-06 ENCOUNTER — Other Ambulatory Visit: Payer: Self-pay | Admitting: Internal Medicine

## 2024-10-08 ENCOUNTER — Other Ambulatory Visit: Payer: Self-pay

## 2024-10-08 ENCOUNTER — Other Ambulatory Visit: Payer: Self-pay | Admitting: Internal Medicine

## 2024-10-08 MED FILL — Clonidine HCl Tab 0.1 MG: ORAL | 90 days supply | Qty: 180 | Fill #0 | Status: CN

## 2024-10-09 ENCOUNTER — Other Ambulatory Visit: Payer: Self-pay

## 2024-10-11 ENCOUNTER — Encounter: Payer: Self-pay | Admitting: Internal Medicine

## 2024-10-15 ENCOUNTER — Other Ambulatory Visit: Payer: Self-pay

## 2024-10-15 ENCOUNTER — Other Ambulatory Visit: Payer: Self-pay | Admitting: Internal Medicine

## 2024-10-15 MED ORDER — GABAPENTIN 300 MG PO CAPS
600.0000 mg | ORAL_CAPSULE | Freq: Three times a day (TID) | ORAL | 0 refills | Status: DC
  Filled 2024-10-15: qty 180, 30d supply, fill #0

## 2024-10-15 MED ORDER — TRELEGY ELLIPTA 100-62.5-25 MCG/ACT IN AEPB
1.0000 | INHALATION_SPRAY | Freq: Every day | RESPIRATORY_TRACT | 11 refills | Status: AC
  Filled 2024-10-15: qty 60, 30d supply, fill #0
  Filled 2024-11-09: qty 60, 30d supply, fill #1
  Filled 2024-12-13: qty 60, 30d supply, fill #2
  Filled 2025-01-10: qty 60, 30d supply, fill #3

## 2024-10-20 ENCOUNTER — Other Ambulatory Visit: Payer: Self-pay

## 2024-10-22 ENCOUNTER — Other Ambulatory Visit: Payer: Self-pay

## 2024-11-05 ENCOUNTER — Encounter

## 2024-11-05 ENCOUNTER — Ambulatory Visit: Admitting: Student in an Organized Health Care Education/Training Program

## 2024-11-06 ENCOUNTER — Encounter

## 2024-11-07 ENCOUNTER — Ambulatory Visit: Admitting: Student in an Organized Health Care Education/Training Program

## 2024-11-07 ENCOUNTER — Other Ambulatory Visit: Payer: Self-pay

## 2024-11-07 ENCOUNTER — Other Ambulatory Visit: Payer: Self-pay | Admitting: Internal Medicine

## 2024-11-07 MED ORDER — GABAPENTIN 300 MG PO CAPS
600.0000 mg | ORAL_CAPSULE | Freq: Three times a day (TID) | ORAL | 1 refills | Status: DC
  Filled 2024-11-07 – 2024-11-12 (×3): qty 180, 30d supply, fill #0
  Filled 2024-12-13: qty 180, 30d supply, fill #1

## 2024-11-07 MED FILL — Clonidine HCl Tab 0.1 MG: ORAL | 90 days supply | Qty: 180 | Fill #0 | Status: AC

## 2024-11-10 ENCOUNTER — Other Ambulatory Visit: Payer: Self-pay

## 2024-11-11 ENCOUNTER — Other Ambulatory Visit: Payer: Self-pay

## 2024-11-12 ENCOUNTER — Other Ambulatory Visit: Payer: Self-pay

## 2024-11-18 ENCOUNTER — Other Ambulatory Visit: Payer: Self-pay

## 2024-12-04 ENCOUNTER — Encounter: Payer: Self-pay | Admitting: Internal Medicine

## 2024-12-04 ENCOUNTER — Other Ambulatory Visit: Payer: Self-pay

## 2024-12-04 ENCOUNTER — Ambulatory Visit: Admitting: Internal Medicine

## 2024-12-04 VITALS — BP 120/70 | HR 62 | Temp 97.0°F | Ht 70.0 in | Wt 197.0 lb

## 2024-12-04 DIAGNOSIS — Z79899 Other long term (current) drug therapy: Secondary | ICD-10-CM

## 2024-12-04 DIAGNOSIS — F1120 Opioid dependence, uncomplicated: Secondary | ICD-10-CM

## 2024-12-04 DIAGNOSIS — F411 Generalized anxiety disorder: Secondary | ICD-10-CM

## 2024-12-04 DIAGNOSIS — J432 Centrilobular emphysema: Secondary | ICD-10-CM | POA: Diagnosis not present

## 2024-12-04 DIAGNOSIS — K760 Fatty (change of) liver, not elsewhere classified: Secondary | ICD-10-CM

## 2024-12-04 DIAGNOSIS — E785 Hyperlipidemia, unspecified: Secondary | ICD-10-CM

## 2024-12-04 DIAGNOSIS — Z720 Tobacco use: Secondary | ICD-10-CM

## 2024-12-04 DIAGNOSIS — Z23 Encounter for immunization: Secondary | ICD-10-CM | POA: Diagnosis not present

## 2024-12-04 DIAGNOSIS — R7301 Impaired fasting glucose: Secondary | ICD-10-CM

## 2024-12-04 DIAGNOSIS — I10 Essential (primary) hypertension: Secondary | ICD-10-CM | POA: Diagnosis not present

## 2024-12-04 DIAGNOSIS — Z125 Encounter for screening for malignant neoplasm of prostate: Secondary | ICD-10-CM

## 2024-12-04 MED ORDER — AMLODIPINE BESYLATE 10 MG PO TABS
10.0000 mg | ORAL_TABLET | Freq: Every day | ORAL | 3 refills | Status: AC
  Filled 2024-12-04: qty 90, 90d supply, fill #0

## 2024-12-04 MED ORDER — ALPRAZOLAM 0.25 MG PO TABS
0.2500 mg | ORAL_TABLET | Freq: Every evening | ORAL | 0 refills | Status: AC | PRN
  Filled 2024-12-04: qty 30, 30d supply, fill #0

## 2024-12-04 MED ORDER — PANTOPRAZOLE SODIUM 40 MG PO TBEC
40.0000 mg | DELAYED_RELEASE_TABLET | Freq: Every day | ORAL | 3 refills | Status: AC
  Filled 2024-12-04 – 2025-01-10 (×2): qty 90, 90d supply, fill #0

## 2024-12-04 MED ORDER — NALOXONE HCL 0.4 MG/ML IJ SOLN
INTRAMUSCULAR | 2 refills | Status: AC
  Filled 2024-12-04: qty 1, 1d supply, fill #0
  Filled 2024-12-13: qty 1, 1d supply, fill #1
  Filled 2025-01-10: qty 1, 1d supply, fill #2

## 2024-12-04 MED ORDER — ATORVASTATIN CALCIUM 20 MG PO TABS
20.0000 mg | ORAL_TABLET | Freq: Every day | ORAL | 3 refills | Status: AC
  Filled 2024-12-04: qty 90, 90d supply, fill #0

## 2024-12-04 MED ORDER — VALSARTAN-HYDROCHLOROTHIAZIDE 160-25 MG PO TABS
1.0000 | ORAL_TABLET | Freq: Two times a day (BID) | ORAL | 3 refills | Status: AC
  Filled 2024-12-04 – 2025-01-10 (×2): qty 180, 90d supply, fill #0

## 2024-12-04 MED ORDER — HYDROCODONE-ACETAMINOPHEN 10-325 MG PO TABS
1.0000 | ORAL_TABLET | Freq: Three times a day (TID) | ORAL | 0 refills | Status: AC | PRN
  Filled 2024-12-04: qty 90, 30d supply, fill #0

## 2024-12-04 MED ORDER — HYDROCODONE-ACETAMINOPHEN 10-325 MG PO TABS
1.0000 | ORAL_TABLET | Freq: Three times a day (TID) | ORAL | 0 refills | Status: AC | PRN
  Filled 2024-12-04 – 2024-12-18 (×3): qty 90, 30d supply, fill #0

## 2024-12-04 MED ORDER — CLOPIDOGREL BISULFATE 75 MG PO TABS
75.0000 mg | ORAL_TABLET | Freq: Every day | ORAL | 3 refills | Status: AC
  Filled 2024-12-04: qty 90, 90d supply, fill #0

## 2024-12-04 NOTE — Progress Notes (Unsigned)
 "  Subjective:  Patient ID: Tyrone Mcdowell, male    DOB: 28-Jan-1956  Age: 68 y.o. MRN: [id number]  CC: The primary encounter diagnosis was Primary hypertension. Diagnoses of Hyperlipidemia LDL goal <100, Impaired fasting glucose, and Long-term use of high-risk medication were also pertinent to this visit.   HPI Tyrone Mcdowell presents for  Chief Complaint  Patient presents with   Medical Management of Chronic Issues    1) chronic pain: narcotic dependent:  hydrocodone  was reduced to 2 daily in June but increased back to 3 daily in September   2) CT chest reviewed :  aortic and coronary atherosclerosis  reviewed.  Smoking reviewed  aspirin  advised today   3) tobacco abuse:  he is reducing his daily consumption since his pulmonology visit.   4) insomnia:  he has been using alprazolam  intermittently at a low dose.    5) Patient is taking his medications as prescribed and notes no adverse effects.  Home BP readings have been done about once per week and are  generally < 130/80 .  She is avoiding added salt in her diet and walking regularly about 3 times per week for exercise  .   Outpatient Medications Prior to Visit  Medication Sig Dispense Refill   albuterol  (VENTOLIN  HFA) 108 (90 Base) MCG/ACT inhaler Inhale 2 puffs into the lungs every 6 (six) hours as needed for wheezing or shortness of breath. 8.5 g 11   ALPRAZolam  (XANAX ) 0.25 MG tablet Take 1 tablet (0.25 mg total) by mouth at bedtime as needed for anxiety. 30 tablet 2   amLODipine  (NORVASC ) 10 MG tablet Take 1 tablet (10 mg total) by mouth daily. 90 tablet 1   atorvastatin  (LIPITOR) 20 MG tablet Take 1 tablet (20 mg total) by mouth daily at 6 PM. 90 tablet 1   cloNIDine  (CATAPRES ) 0.1 MG tablet Take 1 tablet (0.1 mg total) by mouth 2 (two) times daily. 180 tablet 3   clopidogrel  (PLAVIX ) 75 MG tablet Take 1 tablet (75 mg total) by mouth daily. 90 tablet 1   DULoxetine  (CYMBALTA ) 30 MG capsule Take 3 capsules (90 mg total) by  mouth daily in divided doses 90 capsule 11   Fluticasone -Umeclidin-Vilant (TRELEGY ELLIPTA ) 100-62.5-25 MCG/ACT AEPB Inhale 1 puff into the lungs daily. 60 each 11   furosemide  (LASIX ) 20 MG tablet Take 1 tablet (20 mg total) by mouth daily as needed. 90 tablet 2   gabapentin  (NEURONTIN ) 300 MG capsule Take 2 capsules (600 mg total) by mouth 3 (three) times daily. 180 capsule 1   HYDROcodone -acetaminophen  (NORCO) 10-325 MG tablet Take 1 tablet by mouth 3 (three) times daily as needed for up to 90 doses. 90 tablet 0   HYDROcodone -acetaminophen  (NORCO) 10-325 MG tablet Take 1 tablet by mouth 3 (three) times daily as needed for up to 90 doses. 90 tablet 0   HYDROcodone -acetaminophen  (NORCO) 10-325 MG tablet Take 1 tablet by mouth every 8 (eight) hours as needed. 90 tablet 0   HYDROcodone -acetaminophen  (NORCO) 10-325 MG tablet Take 1 tablet by mouth every 8 (eight) hours as needed. 90 tablet 0   montelukast  (SINGULAIR ) 10 MG tablet Take 1 tablet (10 mg total) by mouth at bedtime. 90 tablet 3   pantoprazole  (PROTONIX ) 40 MG tablet Take 1 tablet (40 mg total) by mouth daily. 90 tablet 3   potassium chloride  SA (KLOR-CON  M) 20 MEQ tablet Take 1 tablet (20 mEq total) by mouth daily. 2 on days with furosemide  40 tablet 2  tiZANidine  (ZANAFLEX ) 4 MG tablet Take 1 tablet (4 mg total) by mouth 3 (three) times daily. 90 tablet 1   valsartan -hydrochlorothiazide  (DIOVAN -HCT) 160-25 MG tablet Take 1 tablet by mouth 2 (two) times daily. 180 tablet 3   No facility-administered medications prior to visit.    Review of Systems;  Patient denies headache, fevers, malaise, unintentional weight loss, skin rash, eye pain, sinus congestion and sinus pain, sore throat, dysphagia,  hemoptysis , cough, dyspnea, wheezing, chest pain, palpitations, orthopnea, edema, abdominal pain, nausea, melena, diarrhea, constipation, flank pain, dysuria, hematuria, urinary  Frequency, nocturia, numbness, tingling, seizures,  Focal  weakness, Loss of consciousness,  Tremor, insomnia, depression, anxiety, and suicidal ideation.      Objective:  BP 120/70   Pulse 62   Temp (!) 97 F (36.1 C) (Oral)   Ht 5' 10 (1.778 m)   Wt 197 lb (89.4 kg)   SpO2 98%   BMI 28.27 kg/m   BP Readings from Last 3 Encounters:  12/04/24 120/70  08/28/24 134/82  08/10/24 (!) 110/58    Wt Readings from Last 3 Encounters:  12/04/24 197 lb (89.4 kg)  08/28/24 191 lb 3.2 oz (86.7 kg)  08/10/24 184 lb 9.6 oz (83.7 kg)    Physical Exam  Lab Results  Component Value Date   HGBA1C 5.2 09/19/2023   HGBA1C 5.3 01/17/2023   HGBA1C 5.6 01/04/2022    Lab Results  Component Value Date   CREATININE 0.91 08/28/2024   CREATININE 0.70 04/19/2024   CREATININE 0.76 03/15/2024    Lab Results  Component Value Date   WBC 4.5 08/28/2024   HGB 12.2 (L) 08/28/2024   HCT 36.6 (L) 08/28/2024   PLT 225.0 08/28/2024   GLUCOSE 88 08/28/2024   CHOL 165 12/21/2023   TRIG 124.0 12/21/2023   HDL 62.50 12/21/2023   LDLDIRECT 66.0 09/19/2023   LDLCALC 77 12/21/2023   ALT 30 08/28/2024   AST 20 08/28/2024   NA 142 08/28/2024   K 3.7 08/28/2024   CL 97 08/28/2024   CREATININE 0.91 08/28/2024   BUN 14 08/28/2024   CO2 39 (H) 08/28/2024   TSH 1.79 09/19/2023   PSA 0.15 01/17/2023   INR 1.07 05/08/2018   HGBA1C 5.2 09/19/2023   MICROALBUR 1.2 03/15/2024    CT CHEST WO CONTRAST Result Date: 09/26/2024 CLINICAL DATA:  Follow up pulmonary nodules. Patient reports lung infection approximately 1 month ago with shortness of breath. History of COPD. No history of malignancy. EXAM: CT CHEST WITHOUT CONTRAST TECHNIQUE: Multidetector CT imaging of the chest was performed following the standard protocol without IV contrast. RADIATION DOSE REDUCTION: This exam was performed according to the departmental dose-optimization program which includes automated exposure control, adjustment of the mA and/or kV according to patient size and/or use of iterative  reconstruction technique. COMPARISON:  Chest CT 04/12/2024. Cardiac CT 06/22/2018. Neck CTA 05/08/2018. FINDINGS: Cardiovascular: Mild atherosclerosis of the aorta, great vessels and coronary arteries. Stable mild central enlargement of the pulmonary arteries. The heart size is normal. There is no pericardial effusion. Mediastinum/Nodes: There are no enlarged mediastinal, hilar or axillary lymph nodes.Hilar assessment is limited by the lack of intravenous contrast, although the hilar contours appear unchanged. Chronic nodularity of the right thyroid  lobe, similar to remote neck CTA. The esophagus appears unremarkable. No pleural effusion or pneumothorax. Lungs/Pleura: No pleural effusion or pneumothorax. Interval improvement in the previously demonstrated extensive right lung peribronchovascular nodularity and ground-glass opacities consistent with improving infection/inflammation. There are some residual nodular components in  the right upper lobe which are more conspicuous than before, although likely inflammatory. These include part solid components measuring 1.7 cm on image 50/3 and 1.8 cm on image 76/3. Central airway thickening has improved. There is no consolidation. Upper abdomen: The visualized upper abdomen appears stable, without significant findings. Mild aortic atherosclerosis. Musculoskeletal/Chest wall: There is no chest wall mass or suspicious osseous finding. Old healed sternal fracture. Mild spondylosis. IMPRESSION: 1. Interval improvement in previously demonstrated right lung peribronchovascular nodularity and ground-glass opacities consistent with improving infection/inflammation. There are some residual nodular components in the right upper lobe which are more conspicuous than before, although still likely infectious or inflammatory. Consider continued surveillance with follow-up CT in 6 months. 2. No adenopathy or pleural effusion. 3. Right thyroid  nodularity as before. If not previously  performed, recommend thyroid  US .(Ref: J Am Coll Radiol. 2015 Feb;12(2): 143-50). 4.  Aortic Atherosclerosis (ICD10-I70.0). Electronically Signed   By: Elsie Perone M.D.   On: 09/26/2024 12:16    Assessment & Plan:  .Primary hypertension  Hyperlipidemia LDL goal <100  Impaired fasting glucose  Long-term use of high-risk medication     I spent 34 minutes on the day of this face to face encounter reviewing patient's  most recent visit with cardiology,  nephrology,  and neurology,  prior relevant surgical and non surgical procedures, recent  labs and imaging studies, counseling on weight management,  reviewing the assessment and plan with patient, and post visit ordering and reviewing of  diagnostics and therapeutics with patient  .   Follow-up: No follow-ups on file.   Verneita LITTIE Kettering, MD "

## 2024-12-04 NOTE — Patient Instructions (Addendum)
 Consider adding 81 mg aspirin  daily to protect  heart    You will need to start finding an alternative sleep aid.  I will no longer be able to prescribe alprazolam  and hydrocodone  together .  I have refilled the alprazolam  for 30 days only,  I am refilling the hydrocodone  as well, along with an injectible medication called NALOXONE   this is a RESCUE medicine in case you OVERDOSE.  PLEASE LET KIM HANDLE THIS MEDICATION AND KEEP IT IN A SAFE PLACE

## 2024-12-05 ENCOUNTER — Other Ambulatory Visit: Payer: Self-pay

## 2024-12-05 LAB — PSA, MEDICARE: PSA: 0.14 ng/mL (ref 0.10–4.00)

## 2024-12-05 LAB — CBC WITH DIFFERENTIAL/PLATELET
Basophils Absolute: 0.1 K/uL (ref 0.0–0.1)
Basophils Relative: 1.2 % (ref 0.0–3.0)
Eosinophils Absolute: 0.2 K/uL (ref 0.0–0.7)
Eosinophils Relative: 4.3 % (ref 0.0–5.0)
HCT: 38.5 % — ABNORMAL LOW (ref 39.0–52.0)
Hemoglobin: 12.9 g/dL — ABNORMAL LOW (ref 13.0–17.0)
Lymphocytes Relative: 30.6 % (ref 12.0–46.0)
Lymphs Abs: 1.5 K/uL (ref 0.7–4.0)
MCHC: 33.4 g/dL (ref 30.0–36.0)
MCV: 96.8 fl (ref 78.0–100.0)
Monocytes Absolute: 0.5 K/uL (ref 0.1–1.0)
Monocytes Relative: 10.1 % (ref 3.0–12.0)
Neutro Abs: 2.6 K/uL (ref 1.4–7.7)
Neutrophils Relative %: 53.8 % (ref 43.0–77.0)
Platelets: 293 K/uL (ref 150.0–400.0)
RBC: 3.98 Mil/uL — ABNORMAL LOW (ref 4.22–5.81)
RDW: 14.1 % (ref 11.5–15.5)
WBC: 4.8 K/uL (ref 4.0–10.5)

## 2024-12-05 LAB — LIPID PANEL
Cholesterol: 163 mg/dL (ref 28–200)
HDL: 62.7 mg/dL
LDL Cholesterol: 71 mg/dL (ref 10–99)
NonHDL: 100.39
Total CHOL/HDL Ratio: 3
Triglycerides: 145 mg/dL (ref 10.0–149.0)
VLDL: 29 mg/dL (ref 0.0–40.0)

## 2024-12-05 LAB — COMPREHENSIVE METABOLIC PANEL WITH GFR
ALT: 14 U/L (ref 3–53)
AST: 15 U/L (ref 5–37)
Albumin: 4 g/dL (ref 3.5–5.2)
Alkaline Phosphatase: 77 U/L (ref 39–117)
BUN: 22 mg/dL (ref 6–23)
CO2: 33 meq/L — ABNORMAL HIGH (ref 19–32)
Calcium: 8.8 mg/dL (ref 8.4–10.5)
Chloride: 99 meq/L (ref 96–112)
Creatinine, Ser: 0.91 mg/dL (ref 0.40–1.50)
GFR: 88.02 mL/min
Glucose, Bld: 86 mg/dL (ref 70–99)
Potassium: 3.8 meq/L (ref 3.5–5.1)
Sodium: 141 meq/L (ref 135–145)
Total Bilirubin: 0.3 mg/dL (ref 0.2–1.2)
Total Protein: 6.4 g/dL (ref 6.0–8.3)

## 2024-12-05 LAB — LDL CHOLESTEROL, DIRECT: Direct LDL: 77 mg/dL

## 2024-12-05 LAB — TSH: TSH: 1.29 u[IU]/mL (ref 0.35–5.50)

## 2024-12-05 LAB — HEMOGLOBIN A1C: Hgb A1c MFr Bld: 5.1 % (ref 4.6–6.5)

## 2024-12-05 NOTE — Assessment & Plan Note (Signed)
 Hydrocodone  was increased to 3 daily  several months ago  when  Celebrex  wasstopped due to fatty liver and persistent liver enzyme elevation

## 2024-12-05 NOTE — Assessment & Plan Note (Signed)
 He is taking cymbalta  for pain management but may be benefitting in other ways from the medication He is using  alprazolam   less than once daily to avoid conflict at home turning into shouting matches.    Advised of the risks of concurrent use of benzodiazepines and opioids outweighs the benefits.  Nalaxone rx given.  Will refill alprazolam  for 30 days only .

## 2024-12-05 NOTE — Assessment & Plan Note (Signed)
Smoking cessation instruction/counseling given: commended patient for reducing daily use. And encouraged  Patient to continue reduction in daily use by 1 cigarette every week 

## 2024-12-05 NOTE — Assessment & Plan Note (Addendum)
 Managed now with Trelegy  by pulmonology.  He has resumed smoking but is tryin to reduce his daily use

## 2024-12-05 NOTE — Assessment & Plan Note (Signed)
 Elevation of alk phos resolved with dc/ NSAID. Fibrosis 4 Score = .9  Fib-4 interpretation is not validated for people under 35 or over 66 years of age. However, scores under 2.0 are generally considered low risk.   Lab Results  Component Value Date   ALT 14 12/04/2024   AST 15 12/04/2024   ALKPHOS 77 12/04/2024   BILITOT 0.3 12/04/2024   Lab Results  Component Value Date   WBC 4.8 12/04/2024   HGB 12.9 (L) 12/04/2024   HCT 38.5 (L) 12/04/2024   MCV 96.8 12/04/2024   PLT 293.0 12/04/2024

## 2024-12-06 ENCOUNTER — Ambulatory Visit: Payer: Self-pay | Admitting: Internal Medicine

## 2024-12-12 ENCOUNTER — Other Ambulatory Visit: Payer: Self-pay

## 2024-12-13 ENCOUNTER — Other Ambulatory Visit: Payer: Self-pay

## 2024-12-14 ENCOUNTER — Other Ambulatory Visit: Payer: Self-pay

## 2024-12-18 ENCOUNTER — Other Ambulatory Visit: Payer: Self-pay

## 2024-12-18 ENCOUNTER — Other Ambulatory Visit (HOSPITAL_BASED_OUTPATIENT_CLINIC_OR_DEPARTMENT_OTHER): Payer: Self-pay

## 2024-12-20 ENCOUNTER — Telehealth: Payer: Self-pay

## 2024-12-20 NOTE — Telephone Encounter (Signed)
 Reviewed pt's chart and do not see that Dr. Tullo has ordered a derm referral for pt.  Called pt and made him aware.  Unsure of where referral came from.

## 2024-12-20 NOTE — Telephone Encounter (Signed)
 Copied from CRM [id number]. Topic: Referral - Question >> Dec 20, 2024 10:59 AM Delon DASEN wrote: Reason for CRM: received message about a dermatology referral, patient does not want to see a dermatologist- please call [phone]

## 2024-12-24 ENCOUNTER — Encounter: Payer: Self-pay | Admitting: Student in an Organized Health Care Education/Training Program

## 2024-12-24 ENCOUNTER — Ambulatory Visit: Admitting: Student in an Organized Health Care Education/Training Program

## 2024-12-24 ENCOUNTER — Ambulatory Visit

## 2024-12-24 VITALS — BP 104/64 | HR 57 | Temp 97.7°F | Ht 70.0 in | Wt 191.6 lb

## 2024-12-24 DIAGNOSIS — R911 Solitary pulmonary nodule: Secondary | ICD-10-CM

## 2024-12-24 DIAGNOSIS — F17210 Nicotine dependence, cigarettes, uncomplicated: Secondary | ICD-10-CM

## 2024-12-24 DIAGNOSIS — J449 Chronic obstructive pulmonary disease, unspecified: Secondary | ICD-10-CM | POA: Diagnosis not present

## 2024-12-24 DIAGNOSIS — F17200 Nicotine dependence, unspecified, uncomplicated: Secondary | ICD-10-CM

## 2024-12-24 LAB — PULMONARY FUNCTION TEST
DL/VA % pred: 104 %
DL/VA: 4.26 ml/min/mmHg/L
DLCO cor % pred: 95 %
DLCO cor: 25.24 ml/min/mmHg
DLCO unc % pred: 90 %
DLCO unc: 23.94 ml/min/mmHg
FEF 25-75 Post: 2.31 L/s
FEF 25-75 Pre: 1.8 L/s
FEF2575-%Change-Post: 28 %
FEF2575-%Pred-Post: 86 %
FEF2575-%Pred-Pre: 67 %
FEV1-%Change-Post: 6 %
FEV1-%Pred-Post: 84 %
FEV1-%Pred-Pre: 79 %
FEV1-Post: 2.87 L
FEV1-Pre: 2.71 L
FEV1FVC-%Change-Post: -1 %
FEV1FVC-%Pred-Pre: 94 %
FEV6-%Change-Post: 7 %
FEV6-%Pred-Post: 95 %
FEV6-%Pred-Pre: 88 %
FEV6-Post: 4.12 L
FEV6-Pre: 3.84 L
FEV6FVC-%Change-Post: 0 %
FEV6FVC-%Pred-Post: 104 %
FEV6FVC-%Pred-Pre: 105 %
FVC-%Change-Post: 7 %
FVC-%Pred-Post: 90 %
FVC-%Pred-Pre: 84 %
FVC-Post: 4.15 L
FVC-Pre: 3.85 L
Post FEV1/FVC ratio: 69 %
Post FEV6/FVC ratio: 99 %
Pre FEV1/FVC ratio: 70 %
Pre FEV6/FVC Ratio: 100 %
RV % pred: 117 %
RV: 2.77 L
TLC % pred: 98 %
TLC: 6.88 L

## 2024-12-24 NOTE — Progress Notes (Signed)
 Full PFT completed today ? ?

## 2024-12-24 NOTE — Patient Instructions (Signed)
" °  VISIT SUMMARY: During your visit, we discussed your shortness of breath, pulmonary nodules, and tobacco use. You are currently using Trelegy and albuterol  for your symptoms, and we reviewed your recent CT scans and pulmonary function tests.  YOUR PLAN: -PULMONARY NODULE: Pulmonary nodules are small growths in the lungs that can be benign or a sign of an underlying condition. Your recent CT scan showed improvement in the nodules, which is a good sign. We will do another CT scan in mid-April to continue monitoring them.  -CHRONIC OBSTRUCTIVE PULMONARY DISEASE (COPD): COPD is a chronic lung condition that makes it hard to breathe. Your pulmonary function tests show mild COPD. Continue using Trelegy once daily and albuterol  as needed for shortness of breath.  -TOBACCO USE DISORDER: Tobacco use disorder is a dependence on tobacco products. You have made several attempts to quit smoking, and it's important to keep trying. Use the nicotine  patches and lozenges you have at home to help you quit. We encourage you to continue your efforts to stop smoking.  INSTRUCTIONS: Please schedule a follow-up CT scan for mid-April to monitor your pulmonary nodules.    Contains text generated by [employer].   "

## 2024-12-24 NOTE — Patient Instructions (Signed)
 Full PFT completed today ? ?

## 2024-12-25 NOTE — Progress Notes (Signed)
 " Assessment & Plan  #Pulmonary nodule    Previous CT scan showed scatter nodularity and ground glass opacities. A repeat CT scan in October showed improvement consistent with inflammation. A follow-up CT scan is ordered for mid-April to monitor the pulmonary nodules.  - CT CHEST LCS NODULE F/U LOW DOSE WO CONTRAST; Future  #Chronic obstructive pulmonary disease  Mild COPD is confirmed by PFTs with FEV1/FVC of 0.7, and Fev1 at 84% predicted. He has normal lung volumes and DLCO. Symptoms include exertional dyspnea. Current treatment with Trelegy and albuterol  is effective.   -Continue Trelegy one puff once daily and use albuterol  as needed  #Tobacco use disorder    He has made multiple quit attempts with a recent relapse. The importance of quitting is emphasized. Nicotine  patches and lozenges are available. He is encouraged to continue attempts to quit smoking and utilize available nicotine  patches and lozenges as needed.   Return in about 3 months (around 03/24/2025).  Tyrone November, MD [facility]  I spent 35 minutes caring for this patient today, including preparing to see the patient, obtaining a medical history , reviewing a separately obtained history, performing a medically appropriate examination and/or evaluation, counseling and educating the patient/family/caregiver, ordering medications, tests, or procedures, documenting clinical information in the electronic health record, and independently interpreting results (not separately reported/billed) and communicating results to the patient/family/caregiver.4 minutes utilized during today's visit to counsel the patient regarding the importance and strategies of smoking cessation.  End of visit medications:  No orders of the defined types were placed in this encounter.   Current Medications[1]   Subjective:   PATIENT ID: Tyrone Mcdowell: male DOB: [birth year], MRN: [id number]  Chief Complaint   Patient presents with   Follow-up    Review PFT from today.  Doing well today.  No sx noted.    HPI  Discussed the use of AI scribe software for clinical note transcription with the patient, who gave verbal consent to proceed.  History of Present Illness Tyrone Mcdowell is a 69 year old male with shortness of breath, pulmonary nodules, and tobacco use disorder who presents for follow-up.  Initial Visit 08/10/2024:  Patient is in his usual state of health and only reports shortness of breath with exertion.  He feels short of breath when walking moderate distances.  He did feel very short of breath when he walked from the parking lot to our office today.  He reports an occasional cough, mostly in the morning; this is at times productive of whitish sputum.  He has not had any increase in the amount of sputum or frequency of his cough recently.  His wife does tell him that he has a wheeze at night.  He has not had any recent exacerbations of his breathing.  He was prescribed an albuterol  inhaler as needed by his primary care provider which he uses to good effect.  He was also given a pill (montelukast ) for his symptoms.   Patient has been enrolled in our lung cancer screening program and had low-dose CT of the chest in May 2025.  This CT showed scattered tree-in-bud nodularity and some scattered ground glass opacities for which a repeat CT scan was ordered for July.  Patient has not yet had his repeat CT scan.  He is presenting today to discuss the findings on his CT.  He does recall having respiratory symptoms around the time of the CT scan.  At that time, his primary care physician  prescribed him a course of antibiotics for respiratory symptoms.  He has felt better since.  Return Visit 12/25/2024:  He experiences exertional dyspnea, particularly when climbing stairs or walking long distances, but finds relief with albuterol  as needed. He uses Trelegy once daily in the morning and finds it  effective, confirming that he rinses his mouth after use. He denies feeling sick.  He was previously enrolled in a lung cancer screening program. A CT scan in September 2025 showed scattered nodularity and ground glass opacities. A follow-up CT in October 2025 showed improvement in the nodularity.  Pulmonary function tests were performed, with normal lung volumes and diffusing capacity for carbon monoxide (DLCO). He has a history of tobacco use and has attempted to quit smoking multiple times. He initially succeeded but relapsed after smoking a cigarette. He still has nicotine  patches and lozenges at home.   Patient previously worked in administrator, sports.  She had some exposure to fiberglass dust.  He reports a longstanding history of smoking, having started at the age of 37.  He currently smokes up to a pack a day.  He probably has between 50 and 60 pack years of smoking history.  He reports some illicit drug use in the 80s.  He does have a brother who had severe asthma.   Ancillary information including prior medications, full medical/surgical/family/social histories, and PFTs (when available) are listed below and have been reviewed.    Review of Systems  Constitutional:  Negative for chills, fever and weight loss.  Respiratory:  Positive for sputum production. Negative for cough, hemoptysis, shortness of breath and wheezing.   Cardiovascular:  Negative for chest pain.     Objective:   Vitals:   12/24/24 1007  BP: 104/64  Pulse: (!) 57  Temp: 97.7 F (36.5 C)  TempSrc: Oral  SpO2: 98%  Weight: 191 lb 9.6 oz (86.9 kg)  Height: 5' 10 (1.778 m)   98% on RA BMI Readings from Last 3 Encounters:  12/24/24 27.49 kg/m  12/24/24 27.61 kg/m  12/04/24 28.27 kg/m   Wt Readings from Last 3 Encounters:  12/24/24 191 lb 9.6 oz (86.9 kg)  12/24/24 192 lb 6.4 oz (87.3 kg)  12/04/24 197 lb (89.4 kg)     Physical Exam Constitutional:      Appearance: Normal appearance.   Cardiovascular:     Rate and Rhythm: Normal rate and regular rhythm.     Pulses: Normal pulses.     Heart sounds: Normal heart sounds.  Pulmonary:     Effort: Pulmonary effort is normal.     Breath sounds: Normal breath sounds.  Neurological:     General: No focal deficit present.     Mental Status: He is alert and oriented to person, place, and time. Mental status is at baseline.       Ancillary Information    Past Medical History:  Diagnosis Date   Anxiety    takes Valium  as needed   Arthritis    Chronic back pain    spondylolisthesis   Depression    takes Zoloft  daily   Hoarseness or changing voice 05/12/2012   S/p ENT evaluation for throat CA.  Negative.      Hyperlipidemia    takes Atorvastatin  daily   Hypertension    takes Micardis  and AMlodipine  daily   Joint pain    Joint swelling    Peripheral edema    takes Lasix  daily   Urinary frequency    takes Rapaflo  daily  Weakness    numbness and tingling     Family History  Problem Relation Age of Onset   Cancer Mother        lung Ca, tobacco abuse   CAD Mother        MI at age 49   Stroke Father    Hypertension Father    Heart disease Father        pacemaker, heart failure   Kidney disease Father    Ulcerative colitis Father    Cancer Sister 44       colon CA   Cancer Other        colon CA at age 45   Ulcerative colitis Brother      Past Surgical History:  Procedure Laterality Date   BACK SURGERY  04/2016   COLONOSCOPY     COLONOSCOPY WITH PROPOFOL  N/A 06/30/2022   Procedure: COLONOSCOPY WITH PROPOFOL ;  Surgeon: Unk Corinn Skiff, MD;  Location: [facility] ENDOSCOPY;  Service: Gastroenterology;  Laterality: N/A;   COLONOSCOPY WITH PROPOFOL  N/A 09/15/2023   Procedure: COLONOSCOPY WITH PROPOFOL ;  Surgeon: Unk Corinn Skiff, MD;  Location: [facility] ENDOSCOPY;  Service: Gastroenterology;  Laterality: N/A;   COLONOSCOPY WITH PROPOFOL  N/A 09/16/2023   Procedure: COLONOSCOPY WITH PROPOFOL ;  Surgeon:  Unk Corinn Skiff, MD;  Location: [facility] ENDOSCOPY;  Service: Gastroenterology;  Laterality: N/A;   FLEXIBLE SIGMOIDOSCOPY N/A 07/13/2022   Procedure: FLEXIBLE SIGMOIDOSCOPY;  Surgeon: Unk Corinn Skiff, MD;  Location: [facility];  Service: Endoscopy;  Laterality: N/A;   TIBIA FRACTURE SURGERY Bilateral 1989   multiple surgeries    TIBIA HARDWARE REMOVAL  1989    Social History   Socioeconomic History   Marital status: Married    Spouse name: Not on file   Number of children: Not on file   Years of education: Not on file   Highest education level: 12th grade  Occupational History   Not on file  Tobacco Use   Smoking status: Every Day    Current packs/day: 0.00    Average packs/day: 1.3 packs/day for 46.7 years (58.4 ttl pk-yrs)    Types: Cigarettes    Start date: 15    Last attempt to quit: 08/14/2024    Years since quitting: 0.3   Smokeless tobacco: Current    Types: Chew   Tobacco comments:    Smokes 0.55 PPD- khj 08/10/2024        Started smoking at 69 yrs old    Smoke 1 PPD at his heaviest    16 year pack history  Vaping Use   Vaping status: Never Used  Substance and Sexual Activity   Alcohol use: No    Alcohol/week: 0.0 standard drinks of alcohol    Comment: no alcohol in over a yr   Drug use: No   Sexual activity: Not Currently  Other Topics Concern   Not on file  Social History Narrative   Mr. Longton is married with 2 grown children and 2 grandchildren. He is on disability for back and leg pain.    Social Drivers of Health   Tobacco Use: High Risk (12/24/2024)   Patient History    Smoking Tobacco Use: Every Day    Smokeless Tobacco Use: Current    Passive Exposure: Not on file  Financial Resource Strain: Low Risk (03/15/2024)   Overall Financial Resource Strain (CARDIA)    Difficulty of Paying Living Expenses: Not very hard  Food Insecurity: No Food Insecurity (03/15/2024)   Hunger Vital Sign  Worried About Programme Researcher, Broadcasting/film/video in the Last Year:  Never true    Ran Out of Food in the Last Year: Never true  Transportation Needs: No Transportation Needs (03/15/2024)   PRAPARE - Administrator, Civil Service (Medical): No    Lack of Transportation (Non-Medical): No  Physical Activity: Sufficiently Active (03/15/2024)   Exercise Vital Sign    Days of Exercise per Week: 7 days    Minutes of Exercise per Session: 30 min  Stress: No Stress Concern Present (03/15/2024)   [employer] of Occupational Health - Occupational Stress Questionnaire    Feeling of Stress : Not at all  Social Connections: Moderately Isolated (03/15/2024)   Social Connection and Isolation Panel    Frequency of Communication with Friends and Family: More than three times a week    Frequency of Social Gatherings with Friends and Family: Once a week    Attends Religious Services: Never    Database Administrator or Organizations: No    Attends Banker Meetings: Never    Marital Status: Married  Catering Manager Violence: Not At Risk (03/15/2024)   Humiliation, Afraid, Rape, and Kick questionnaire    Fear of Current or Ex-Partner: No    Emotionally Abused: No    Physically Abused: No    Sexually Abused: No  Depression (PHQ2-9): Low Risk (05/24/2024)   Depression (PHQ2-9)    PHQ-2 Score: 0  Alcohol Screen: Low Risk (03/15/2024)   Alcohol Screen    Last Alcohol Screening Score (AUDIT): 0  Housing: High Risk (03/15/2024)   Housing Stability Vital Sign    Unable to Pay for Housing in the Last Year: Yes    Number of Times Moved in the Last Year: Not on file    Homeless in the Last Year: No  Utilities: Not At Risk (03/15/2024)   AHC Utilities    Threatened with loss of utilities: No  Health Literacy: Adequate Health Literacy (03/15/2024)   B1300 Health Literacy    Frequency of need for help with medical instructions: Never     Allergies[2]   CBC    Component Value Date/Time   WBC 4.8 12/04/2024 1342   RBC 3.98 (L) 12/04/2024 1342    HGB 12.9 (L) 12/04/2024 1342   HGB 14.3 05/03/2012 2101   HCT 38.5 (L) 12/04/2024 1342   HCT 43.5 05/03/2012 2101   PLT 293.0 12/04/2024 1342   PLT 251 05/03/2012 2101   MCV 96.8 12/04/2024 1342   MCV 94 05/03/2012 2101   MCH 31.7 05/08/2023 1811   MCHC 33.4 12/04/2024 1342   RDW 14.1 12/04/2024 1342   RDW 13.6 05/03/2012 2101   LYMPHSABS 1.5 12/04/2024 1342   MONOABS 0.5 12/04/2024 1342   EOSABS 0.2 12/04/2024 1342   BASOSABS 0.1 12/04/2024 1342    Pulmonary Functions Testing Results:    Latest Ref Rng & Units 12/24/2024    8:19 AM  PFT Results  FVC-Pre L 3.85   FVC-Predicted Pre % 84   FVC-Post L 4.15   FVC-Predicted Post % 90   Pre FEV1/FVC % % 70   Post FEV1/FCV % % 69   FEV1-Pre L 2.71   FEV1-Predicted Pre % 79   FEV1-Post L 2.87   DLCO uncorrected ml/min/mmHg 23.94   DLCO UNC% % 90   DLCO corrected ml/min/mmHg 25.24   DLCO COR %Predicted % 95   DLVA Predicted % 104   TLC L 6.88   TLC % Predicted % 98  RV % Predicted % 117     Outpatient Medications Prior to Visit  Medication Sig Dispense Refill   albuterol  (VENTOLIN  HFA) 108 (90 Base) MCG/ACT inhaler Inhale 2 puffs into the lungs every 6 (six) hours as needed for wheezing or shortness of breath. 8.5 g 11   ALPRAZolam  (XANAX ) 0.25 MG tablet Take 1 tablet (0.25 mg total) by mouth at bedtime as needed for anxiety. 30 tablet 0   amLODipine  (NORVASC ) 10 MG tablet Take 1 tablet (10 mg total) by mouth daily. 90 tablet 3   atorvastatin  (LIPITOR) 20 MG tablet Take 1 tablet (20 mg total) by mouth daily at 6 PM. 90 tablet 3   cloNIDine  (CATAPRES ) 0.1 MG tablet Take 1 tablet (0.1 mg total) by mouth 2 (two) times daily. 180 tablet 3   clopidogrel  (PLAVIX ) 75 MG tablet Take 1 tablet (75 mg total) by mouth daily. 90 tablet 3   DULoxetine  (CYMBALTA ) 30 MG capsule Take 3 capsules (90 mg total) by mouth daily in divided doses 90 capsule 11   Fluticasone -Umeclidin-Vilant (TRELEGY ELLIPTA ) 100-62.5-25 MCG/ACT AEPB Inhale 1 puff  into the lungs daily. 60 each 11   furosemide  (LASIX ) 20 MG tablet Take 1 tablet (20 mg total) by mouth daily as needed. 90 tablet 2   gabapentin  (NEURONTIN ) 300 MG capsule Take 2 capsules (600 mg total) by mouth 3 (three) times daily. 180 capsule 1   HYDROcodone -acetaminophen  (NORCO) 10-325 MG tablet Take 1 tablet by mouth every 8 (eight) hours as needed. 90 tablet 0   HYDROcodone -acetaminophen  (NORCO) 10-325 MG tablet Take 1 tablet by mouth 3 (three) times daily as needed for up to 90 doses. 90 tablet 0   HYDROcodone -acetaminophen  (NORCO) 10-325 MG tablet Take 1 tablet by mouth 3 (three) times daily as needed for up to 90 doses. 90 tablet 0   HYDROcodone -acetaminophen  (NORCO) 10-325 MG tablet Take 1 tablet by mouth every 8 (eight) hours as needed. 90 tablet 0   montelukast  (SINGULAIR ) 10 MG tablet Take 1 tablet (10 mg total) by mouth at bedtime. 90 tablet 3   naloxone  (NARCAN ) 0.4 MG/ML injection Inject into thigh for opioid overdose 1 mL 2   pantoprazole  (PROTONIX ) 40 MG tablet Take 1 tablet (40 mg total) by mouth daily. 90 tablet 3   potassium chloride  SA (KLOR-CON  M) 20 MEQ tablet Take 1 tablet (20 mEq total) by mouth daily. 2 on days with furosemide  40 tablet 2   tiZANidine  (ZANAFLEX ) 4 MG tablet Take 1 tablet (4 mg total) by mouth 3 (three) times daily. 90 tablet 1   valsartan -hydrochlorothiazide  (DIOVAN -HCT) 160-25 MG tablet Take 1 tablet by mouth 2 (two) times daily. 180 tablet 3   No facility-administered medications prior to visit.      [1]  Current Outpatient Medications:    albuterol  (VENTOLIN  HFA) 108 (90 Base) MCG/ACT inhaler, Inhale 2 puffs into the lungs every 6 (six) hours as needed for wheezing or shortness of breath., Disp: 8.5 g, Rfl: 11   ALPRAZolam  (XANAX ) 0.25 MG tablet, Take 1 tablet (0.25 mg total) by mouth at bedtime as needed for anxiety., Disp: 30 tablet, Rfl: 0   amLODipine  (NORVASC ) 10 MG tablet, Take 1 tablet (10 mg total) by mouth daily., Disp: 90 tablet, Rfl:  3   atorvastatin  (LIPITOR) 20 MG tablet, Take 1 tablet (20 mg total) by mouth daily at 6 PM., Disp: 90 tablet, Rfl: 3   cloNIDine  (CATAPRES ) 0.1 MG tablet, Take 1 tablet (0.1 mg total) by mouth 2 (two) times daily.,  Disp: 180 tablet, Rfl: 3   clopidogrel  (PLAVIX ) 75 MG tablet, Take 1 tablet (75 mg total) by mouth daily., Disp: 90 tablet, Rfl: 3   DULoxetine  (CYMBALTA ) 30 MG capsule, Take 3 capsules (90 mg total) by mouth daily in divided doses, Disp: 90 capsule, Rfl: 11   Fluticasone -Umeclidin-Vilant (TRELEGY ELLIPTA ) 100-62.5-25 MCG/ACT AEPB, Inhale 1 puff into the lungs daily., Disp: 60 each, Rfl: 11   furosemide  (LASIX ) 20 MG tablet, Take 1 tablet (20 mg total) by mouth daily as needed., Disp: 90 tablet, Rfl: 2   gabapentin  (NEURONTIN ) 300 MG capsule, Take 2 capsules (600 mg total) by mouth 3 (three) times daily., Disp: 180 capsule, Rfl: 1   HYDROcodone -acetaminophen  (NORCO) 10-325 MG tablet, Take 1 tablet by mouth every 8 (eight) hours as needed., Disp: 90 tablet, Rfl: 0   HYDROcodone -acetaminophen  (NORCO) 10-325 MG tablet, Take 1 tablet by mouth 3 (three) times daily as needed for up to 90 doses., Disp: 90 tablet, Rfl: 0   HYDROcodone -acetaminophen  (NORCO) 10-325 MG tablet, Take 1 tablet by mouth 3 (three) times daily as needed for up to 90 doses., Disp: 90 tablet, Rfl: 0   HYDROcodone -acetaminophen  (NORCO) 10-325 MG tablet, Take 1 tablet by mouth every 8 (eight) hours as needed., Disp: 90 tablet, Rfl: 0   montelukast  (SINGULAIR ) 10 MG tablet, Take 1 tablet (10 mg total) by mouth at bedtime., Disp: 90 tablet, Rfl: 3   naloxone  (NARCAN ) 0.4 MG/ML injection, Inject into thigh for opioid overdose, Disp: 1 mL, Rfl: 2   pantoprazole  (PROTONIX ) 40 MG tablet, Take 1 tablet (40 mg total) by mouth daily., Disp: 90 tablet, Rfl: 3   potassium chloride  SA (KLOR-CON  M) 20 MEQ tablet, Take 1 tablet (20 mEq total) by mouth daily. 2 on days with furosemide , Disp: 40 tablet, Rfl: 2   tiZANidine  (ZANAFLEX ) 4 MG  tablet, Take 1 tablet (4 mg total) by mouth 3 (three) times daily., Disp: 90 tablet, Rfl: 1   valsartan -hydrochlorothiazide  (DIOVAN -HCT) 160-25 MG tablet, Take 1 tablet by mouth 2 (two) times daily., Disp: 180 tablet, Rfl: 3 [2] No Known Allergies  "

## 2025-01-10 ENCOUNTER — Other Ambulatory Visit: Payer: Self-pay | Admitting: Internal Medicine

## 2025-01-10 ENCOUNTER — Other Ambulatory Visit: Payer: Self-pay

## 2025-01-11 ENCOUNTER — Other Ambulatory Visit: Payer: Self-pay

## 2025-01-11 MED ORDER — MONTELUKAST SODIUM 10 MG PO TABS
10.0000 mg | ORAL_TABLET | Freq: Every day | ORAL | 3 refills | Status: AC
  Filled 2025-01-11: qty 90, 90d supply, fill #0

## 2025-01-11 MED ORDER — GABAPENTIN 300 MG PO CAPS
600.0000 mg | ORAL_CAPSULE | Freq: Three times a day (TID) | ORAL | 1 refills | Status: AC
  Filled 2025-01-11: qty 180, 30d supply, fill #0

## 2025-03-05 ENCOUNTER — Ambulatory Visit: Admitting: Internal Medicine

## 2025-03-25 ENCOUNTER — Ambulatory Visit

## 2025-04-05 ENCOUNTER — Ambulatory Visit: Admitting: Student in an Organized Health Care Education/Training Program
